# Patient Record
Sex: Female | Born: 1955
Health system: Southern US, Community
[De-identification: ages and names within clinical notes are randomized; demographics above are authoritative.]

## PROBLEM LIST (undated history)

## (undated) DIAGNOSIS — M1990 Unspecified osteoarthritis, unspecified site: Secondary | ICD-10-CM

## (undated) DIAGNOSIS — E785 Hyperlipidemia, unspecified: Secondary | ICD-10-CM

## (undated) DIAGNOSIS — K219 Gastro-esophageal reflux disease without esophagitis: Secondary | ICD-10-CM

## (undated) DIAGNOSIS — G4730 Sleep apnea, unspecified: Secondary | ICD-10-CM

## (undated) DIAGNOSIS — E669 Obesity, unspecified: Secondary | ICD-10-CM

## (undated) DIAGNOSIS — T7840XA Allergy, unspecified, initial encounter: Secondary | ICD-10-CM

## (undated) DIAGNOSIS — F329 Major depressive disorder, single episode, unspecified: Secondary | ICD-10-CM

## (undated) DIAGNOSIS — F32A Depression, unspecified: Secondary | ICD-10-CM

## (undated) DIAGNOSIS — M2550 Pain in unspecified joint: Secondary | ICD-10-CM

## (undated) DIAGNOSIS — R0602 Shortness of breath: Secondary | ICD-10-CM

## (undated) DIAGNOSIS — I10 Essential (primary) hypertension: Secondary | ICD-10-CM

## (undated) DIAGNOSIS — M8580 Other specified disorders of bone density and structure, unspecified site: Secondary | ICD-10-CM

## (undated) DIAGNOSIS — R002 Palpitations: Secondary | ICD-10-CM

## (undated) DIAGNOSIS — R5383 Other fatigue: Secondary | ICD-10-CM

## (undated) HISTORY — DX: Allergy, unspecified, initial encounter: T78.40XA

## (undated) HISTORY — DX: Hyperlipidemia, unspecified: E78.5

## (undated) HISTORY — DX: Gastro-esophageal reflux disease without esophagitis: K21.9

## (undated) HISTORY — DX: Obesity, unspecified: E66.9

## (undated) HISTORY — DX: Other specified disorders of bone density and structure, unspecified site: M85.80

## (undated) HISTORY — DX: Pain in unspecified joint: M25.50

## (undated) HISTORY — DX: Other fatigue: R53.83

## (undated) HISTORY — DX: Depression, unspecified: F32.A

## (undated) HISTORY — DX: Major depressive disorder, single episode, unspecified: F32.9

## (undated) HISTORY — PX: BREAST SURGERY: SHX581

## (undated) HISTORY — DX: Essential (primary) hypertension: I10

## (undated) HISTORY — DX: Sleep apnea, unspecified: G47.30

## (undated) HISTORY — PX: RETINAL DETACHMENT SURGERY: SHX105

## (undated) HISTORY — DX: Unspecified osteoarthritis, unspecified site: M19.90

## (undated) HISTORY — PX: KNEE ARTHROSCOPY: SHX127

## (undated) HISTORY — PX: RHINOPLASTY: SHX2354

## (undated) HISTORY — DX: Shortness of breath: R06.02

## (undated) HISTORY — DX: Palpitations: R00.2

---

## 1988-08-14 HISTORY — PX: LAPAROTOMY: SHX154

## 1998-08-14 HISTORY — PX: COLOSTOMY: SHX63

## 1999-03-28 ENCOUNTER — Encounter: Payer: Self-pay | Admitting: Gastroenterology

## 1999-03-28 ENCOUNTER — Ambulatory Visit (HOSPITAL_COMMUNITY): Admission: RE | Admit: 1999-03-28 | Discharge: 1999-03-28 | Payer: Self-pay | Admitting: Gastroenterology

## 2000-08-10 ENCOUNTER — Encounter (INDEPENDENT_AMBULATORY_CARE_PROVIDER_SITE_OTHER): Payer: Self-pay | Admitting: Specialist

## 2000-08-10 ENCOUNTER — Inpatient Hospital Stay (HOSPITAL_COMMUNITY): Admission: RE | Admit: 2000-08-10 | Discharge: 2000-08-13 | Payer: Self-pay | Admitting: Obstetrics and Gynecology

## 2000-08-10 HISTORY — PX: ABDOMINAL HYSTERECTOMY: SHX81

## 2000-08-17 ENCOUNTER — Ambulatory Visit (HOSPITAL_COMMUNITY): Admission: RE | Admit: 2000-08-17 | Discharge: 2000-08-17 | Payer: Self-pay | Admitting: Obstetrics and Gynecology

## 2000-08-17 ENCOUNTER — Encounter: Payer: Self-pay | Admitting: Obstetrics and Gynecology

## 2001-08-27 ENCOUNTER — Other Ambulatory Visit: Admission: RE | Admit: 2001-08-27 | Discharge: 2001-08-27 | Payer: Self-pay | Admitting: Obstetrics and Gynecology

## 2002-09-23 ENCOUNTER — Other Ambulatory Visit: Admission: RE | Admit: 2002-09-23 | Discharge: 2002-09-23 | Payer: Self-pay | Admitting: Obstetrics and Gynecology

## 2003-10-15 ENCOUNTER — Other Ambulatory Visit: Admission: RE | Admit: 2003-10-15 | Discharge: 2003-10-15 | Payer: Self-pay | Admitting: Obstetrics and Gynecology

## 2003-11-18 ENCOUNTER — Ambulatory Visit (HOSPITAL_COMMUNITY): Admission: RE | Admit: 2003-11-18 | Discharge: 2003-11-18 | Payer: Self-pay | Admitting: *Deleted

## 2004-10-28 ENCOUNTER — Encounter: Admission: RE | Admit: 2004-10-28 | Discharge: 2004-10-28 | Payer: Self-pay | Admitting: Obstetrics and Gynecology

## 2004-11-29 ENCOUNTER — Other Ambulatory Visit: Admission: RE | Admit: 2004-11-29 | Discharge: 2004-11-29 | Payer: Self-pay | Admitting: Obstetrics and Gynecology

## 2006-12-26 ENCOUNTER — Other Ambulatory Visit: Admission: RE | Admit: 2006-12-26 | Discharge: 2006-12-26 | Payer: Self-pay | Admitting: Obstetrics and Gynecology

## 2008-05-13 ENCOUNTER — Ambulatory Visit (HOSPITAL_COMMUNITY): Admission: RE | Admit: 2008-05-13 | Discharge: 2008-05-13 | Payer: Self-pay | Admitting: Emergency Medicine

## 2008-07-02 ENCOUNTER — Ambulatory Visit: Payer: Self-pay | Admitting: Obstetrics and Gynecology

## 2008-07-15 ENCOUNTER — Ambulatory Visit: Payer: Self-pay | Admitting: Obstetrics and Gynecology

## 2008-07-15 ENCOUNTER — Other Ambulatory Visit: Admission: RE | Admit: 2008-07-15 | Discharge: 2008-07-15 | Payer: Self-pay | Admitting: Obstetrics and Gynecology

## 2008-07-15 ENCOUNTER — Encounter: Payer: Self-pay | Admitting: Obstetrics and Gynecology

## 2008-08-14 HISTORY — PX: LAPAROSCOPIC CHOLECYSTECTOMY: SUR755

## 2008-11-05 ENCOUNTER — Ambulatory Visit: Payer: Self-pay | Admitting: Obstetrics and Gynecology

## 2008-12-01 ENCOUNTER — Ambulatory Visit: Payer: Self-pay | Admitting: Obstetrics and Gynecology

## 2009-03-08 ENCOUNTER — Ambulatory Visit: Payer: Self-pay | Admitting: Obstetrics and Gynecology

## 2009-11-04 ENCOUNTER — Other Ambulatory Visit: Admission: RE | Admit: 2009-11-04 | Discharge: 2009-11-04 | Payer: Self-pay | Admitting: Obstetrics and Gynecology

## 2009-11-04 ENCOUNTER — Ambulatory Visit: Payer: Self-pay | Admitting: Obstetrics and Gynecology

## 2010-01-04 ENCOUNTER — Ambulatory Visit: Payer: Self-pay | Admitting: Obstetrics and Gynecology

## 2010-01-11 ENCOUNTER — Ambulatory Visit: Payer: Self-pay | Admitting: Obstetrics and Gynecology

## 2010-12-19 ENCOUNTER — Other Ambulatory Visit: Payer: Federal, State, Local not specified - PPO

## 2010-12-19 ENCOUNTER — Ambulatory Visit (INDEPENDENT_AMBULATORY_CARE_PROVIDER_SITE_OTHER): Payer: Federal, State, Local not specified - PPO | Admitting: Obstetrics and Gynecology

## 2010-12-19 DIAGNOSIS — N83339 Acquired atrophy of ovary and fallopian tube, unspecified side: Secondary | ICD-10-CM

## 2010-12-19 DIAGNOSIS — R5381 Other malaise: Secondary | ICD-10-CM

## 2010-12-19 DIAGNOSIS — N7013 Chronic salpingitis and oophoritis: Secondary | ICD-10-CM

## 2010-12-19 DIAGNOSIS — R1904 Left lower quadrant abdominal swelling, mass and lump: Secondary | ICD-10-CM

## 2010-12-19 DIAGNOSIS — Z833 Family history of diabetes mellitus: Secondary | ICD-10-CM

## 2010-12-30 NOTE — Op Note (Signed)
[facility]  Patient:    Cheryl [name], Cheryl [name]                   MRN: [id number] Proc. Date: 08/10/00 Adm. Date:  [id number] Attending:  Sharon Mt                           Operative Report  PREOPERATIVE DIAGNOSIS:  Fibroids, dysmenorrhea, menorrhagia, pelvic pain -- endometriosis suspected.  POSTOPERATIVE DIAGNOSIS:  Fibroids, dysmenorrhea, menorrhagia, pelvic pain, with endometriosis.  OPERATION:  Exploratory laparotomy with total abdominal hysterectomy, right salpingo-oophorectomy, cystotomy and repair with cystotomy.  SURGEON:  Daniel L. Eda Paschal, M.D.  FIRST ASSISTANT:  Timothy P. Fontaine, M.D.  ANESTHESIA:  General endotracheal.  FINDINGS:  At the time of laparotomy, patients uterus was enlarged by multiple myelomas.  Her right ovary was enlarged by a cyst, which on frozen section was a benign corpus luteum.  She had a significant endometrial implant on the surface of the ovary of approximately 0.5 cm.  There were some mild pelvic adhesions from her previous right ovarian cystectomy.  Pelvic peritoneum was free of disease otherwise.  Left ovary and tube were normal and were left in place.  Patient had significant preperitoneal fat and some adhesive disease of the bladder to the above and the bladder was inadvertently opened during the opening of the procedure and looked normal; it was the top of the bladder that was opened for approximately two inches.  DESCRIPTION OF PROCEDURE:  After adequate general endotracheal anesthesia, the patient was placed in a supine position and prepped and draped in the usual sterile manner.  A Foley catheter was inserted in the patients bladder. Pfannenstiel incision was made.  The fascia was opened transversely.  An attempt was made to enter the peritoneal cavity; this was very difficult due to a significant amount of preperitoneal fat, plus there also appeared to be significant adhesive disease  from her previous laparotomy.  At one point, it was felt that the peritoneum had been identified.  It was elevated.  It transilluminated well and therefore, a small incision was made into it; however, it was felt that this was indeed the bladder, not the peritoneal cavity that had been entered.  The total length of the incision in the bladder was about two inches.  It was a sharp incision.  It was at the top of the bladder at a very fortuitous place.  As a result of this, with the bladder opened, the peritoneum finally could be identified and entered and the peritoneal cavity was opened.  The peritoneum was then dissected free from the bladder so that a tension-free closure of the bladder could be done.  The bladder was closed in two layers; the first layer was 3-0 Vicryl, trying to close the muscularis of the bladder and leaving the mucosa approximated without actually suturing in the mucosa; this was a running-locking layer. Following this, a reinforced layer of 0 Vicryl was placed, imbricating the first layer; an excellent closure was obtained.  It was not tested to be watertight until the procedure was terminated.  At this point, the surgery proceeded.  The above findings were noted.  The round ligaments were clamped, cut and suture-ligated with #1 chromic catgut.  The vesicouterine fold of peritoneum was sharply dissected free.  On the right, the ureter was identified, isolated and then the infundibulopelvic ligament on the right was clamped, cut and suture-ligated with #1 chromic catgut.  It was then doubly ligated.  The ovary and tube on the right were removed and sent for frozen section, which came back corpus luteum in terms of a large cyst and probably endometriosis in terms of the implant on the surface.  On the left side, the ovary looked normal, so the uteroovarian ligament and round ligament were clamped, cut and suture-ligated with #1 chromic catgut; they were doubly ligated.   The bladder flap was further advanced.  There was a myoma right in the midline but we were able to advance the bladder without difficulty.  The uterine arteries were then clamped, cut and doubly suture-ligated with #1 chromic catgut.  The parametrium was taken down in successive bites by clamping, cutting and suture-ligating with #1 chromic catgut.  Cervicovaginal junction was identified, entered with sharp dissection and the uterus was sent to pathology for tissue diagnosis.  Angle sutures were placed in the angles of the vagina, incorporating uterosacral ligaments and cardinal ligaments for good vault support.  A #1 chromic catgut was utilized for the two angle sutures.  Figure-of-eight of #1 chromic were utilized to close the vaginal cuff.  Copious irrigation was done with Ringers lactate.  There was no bleeding noted.  Two sponge, needle and instrument counts were correct.  The peritoneal cavity was closed with a running 0 Vicryl.  At this point, the area where the cystotomy had been done and repair was reidentified.  The bladder was filled with sterile saline.  There was a watertight seal.  The urine was already clear and therefore it was felt that no further repair was necessary. The fascia was closed with two running 0 Vicryl.  A subcutaneous running 2-0 plain suture was placed, bringing together Scarpas layer and then the skin was closed staples.  Estimated blood loss for the entire procedure was 250 cc with none replaced.  Patient tolerated the procedure well and left the operating room in satisfactory condition, draining just slightly ______ . DD:  08/10/00 TD:  08/10/00 Job: [id number]

## 2010-12-30 NOTE — Discharge Summary (Signed)
[facility]  Patient:    Cheryl [name], Cheryl [name]                       MRN: [id number] Adm. Date:  08/10/00 Disc. Date: 08/13/00 Attending:  Rande Brunt. Eda Paschal, M.D.                           Discharge Summary  HISTORY OF PRESENT ILLNESS:  The patient is a 55 year old female who was admitted to the hospital with pelvic pain from fibroids for definitive surgery.  HOSPITAL COURSE:  On the day of admission she was taken to the operating room. A total abdominal hysterectomy and right salpingo-oophorectomy were performed with findings of fibroids and endometriosis.  During the surgery she had an inadvertent cystotomy which was repaired without difficulty.  Postoperatively she had an ileus which responded to continued IV fluids and restricting p.o. By the third postoperative day she finally started to pass gas.  She was discharged on a soft diet to advance as tolerated.  Activity was ambulatory. Discharge pain medicine was Tylox.  Final pathology report is not available at the time of dictation.  The patient was discharged with her Foley in place, to keep the bladder at rest for seven days because of the cystotomy repair.  On Friday of this week she will come in the hospital for a cystogram.  If the healing is fine, she will then go to my office for Foley removal.DD:  08/13/00 TD:  08/13/00 Job: [id number]

## 2010-12-30 NOTE — H&P (Signed)
[facility]  Patient:    Cheryl [name], Cheryl [name]                       MRN: [id number] Attending:  Rande Brunt. Eda Paschal, M.D.                         History and Physical  CHIEF COMPLAINT:  Symptomatic fibroids.  HISTORY OF PRESENT ILLNESS:  The patient is a 55 year old gravida 0, para 0, Ab0, who enters the hospital now for definitive surgery because of symptomatic fibroids.  She is getting more and more pain with her fibroids, they are mostly on the right side, they occur either midcycle when she is ovulating or when she is having her period.  It has been associated with both menorrhagia and also dysmenorrhea.  On ultrasound, she has multiple myomata, the largest of which is 5 cm; she also has at least four other ones.  Patient had a previous ovarian cystectomy and also had endometriosis and it is also certainly possible that she had a recurrence of her endometriosis.  She will undergo total abdominal hysterectomy; if there is significant disease involving her right adnexa, she will also undergo a right salpingo-oophorectomy, as her pain is always right-sided.  We will not remove both ovaries unless she has a malignancy, because of her age.  PAST MEDICAL HISTORY:  Right ovarian cystectomy done in 1990 for a dermoid cyst of the right ovary as well as endometriosis.  Patient has also had previous breast biopsy.  PRESENT MEDICATIONS:  Nonsteroidal anti-inflammatory drugs for fibroids.  ALLERGIES:  Patient is allergic to no drugs.  FAMILY HISTORY:  Father is hypertensive, grandfather had colon cancer and mother has had breast cancer.  SOCIAL HISTORY:  Patient is a nonsmoker but she drinks alcohol moderately.  REVIEW OF SYSTEMS:  HEENT:  Negative.  CARDIAC:  Negative.  RESPIRATORY: Negative.  GI:  History of a colonoscopy because of irritable bowel syndrome. GU:  Negative.  NEUROMUSCULAR:  Negative.  ENDOCRINE:  Negative.  PHYSICAL EXAMINATION  GENERAL:   Patient is a well-developed, well-nourished female in no acute distress.  VITAL SIGNS:  Blood pressure is 120/86, pulse is 80 and regular, respirations 16 and unlabored.  She is afebrile.  HEENT:  All within normal limits.  NECK:  Supple.  Trachea in the midline.  Thyroid is not enlarged.  LUNGS:  Clear to P&A.  HEART:  No thrills, heaves or murmurs.  BREASTS:  No masses.  ABDOMEN:  Soft, without guarding, rebound or masses.  PELVIC:  External and vagina is within normal limits.  Cervix is clear.  Pap smear showed no atypia.  Uterus is enlarged by multiple myelomas to about 10- and 11-week-size.  Adnexa are palpably normal.  RECTAL:  Negative.  ADMISSION IMPRESSION:  Symptomatic fibroids; possible endometriosis.  PLAN:  Exploratory laparotomy with total abdominal hysterectomy and possible right salpingo-oophorectomy. DD:  08/10/00 TD:  08/10/00 Job: [id number]

## 2011-01-05 ENCOUNTER — Encounter: Payer: Self-pay | Admitting: Cardiology

## 2011-01-06 ENCOUNTER — Ambulatory Visit (INDEPENDENT_AMBULATORY_CARE_PROVIDER_SITE_OTHER): Payer: Federal, State, Local not specified - PPO | Admitting: Cardiology

## 2011-01-06 ENCOUNTER — Encounter: Payer: Self-pay | Admitting: Cardiology

## 2011-01-06 VITALS — BP 118/76 | HR 75 | Ht 66.0 in | Wt 242.0 lb

## 2011-01-06 DIAGNOSIS — R079 Chest pain, unspecified: Secondary | ICD-10-CM | POA: Insufficient documentation

## 2011-01-06 DIAGNOSIS — E669 Obesity, unspecified: Secondary | ICD-10-CM | POA: Insufficient documentation

## 2011-01-06 DIAGNOSIS — R0602 Shortness of breath: Secondary | ICD-10-CM

## 2011-01-06 NOTE — Patient Instructions (Signed)
Your physician has requested that you have a lexiscan myoview. For further information please visit [email or web address] Please follow instruction sheet, as given.    Your physician recommends that you schedule a follow-up appointment in: as needed with Dr. Daleen Squibb

## 2011-01-06 NOTE — Progress Notes (Signed)
   Patient ID: Cheryl [name], female    DOB: Jan 09, 1956, 55 y.o.   MRN: [id number]  HPI [name] comes in today referred by Dr Perrin Maltese for chest pain. Has been happening for the last 6 weeks. Described as substernal pressure that can occur with and without exertion. At rest, it occurs with stress. She has a lot of GERD as well and only takes Pepcid AC OTC.  She has several CRF's including remote tobacco, obesity, HTN and age.  EKG today shows NSR with no ST changes. No change from Dr Perrin Maltese office.    Review of Systems  All other systems reviewed and are negative.      Physical Exam  Nursing note and vitals reviewed. Constitutional: She is oriented to person, place, and time. She appears well-developed and well-nourished. No distress.       obese  HENT:  Head: Normocephalic and atraumatic.  Eyes: EOM are normal. Pupils are equal, round, and reactive to light.  Neck: Normal range of motion. Neck supple. No JVD present. No tracheal deviation present. No thyromegaly present.  Cardiovascular: Normal rate, regular rhythm, normal heart sounds and intact distal pulses.   No murmur heard. Pulmonary/Chest: Effort normal and breath sounds normal.  Abdominal: Soft. Bowel sounds are normal.  Musculoskeletal: Normal range of motion. She exhibits no edema.  Neurological: She is alert and oriented to person, place, and time.  Skin: Skin is warm and dry.  Psychiatric: She has a normal mood and affect.

## 2011-01-06 NOTE — Assessment & Plan Note (Signed)
Symptoms concerning for coronary ischemia. Stress Myoview for ruling out obstructive CAD. No change in meds.

## 2011-01-11 ENCOUNTER — Encounter: Payer: Self-pay | Admitting: Cardiology

## 2011-01-12 ENCOUNTER — Encounter (INDEPENDENT_AMBULATORY_CARE_PROVIDER_SITE_OTHER): Payer: Federal, State, Local not specified - PPO | Admitting: Obstetrics and Gynecology

## 2011-01-12 ENCOUNTER — Other Ambulatory Visit (HOSPITAL_COMMUNITY)
Admission: RE | Admit: 2011-01-12 | Discharge: 2011-01-12 | Disposition: A | Discharge status: Home / Self Care | Payer: Federal, State, Local not specified - PPO | Source: Ambulatory Visit | Attending: Obstetrics and Gynecology | Admitting: Obstetrics and Gynecology

## 2011-01-12 ENCOUNTER — Other Ambulatory Visit: Payer: Self-pay | Admitting: Obstetrics and Gynecology

## 2011-01-12 DIAGNOSIS — R82998 Other abnormal findings in urine: Secondary | ICD-10-CM

## 2011-01-12 DIAGNOSIS — Z124 Encounter for screening for malignant neoplasm of cervix: Secondary | ICD-10-CM | POA: Insufficient documentation

## 2011-01-12 DIAGNOSIS — Z01419 Encounter for gynecological examination (general) (routine) without abnormal findings: Secondary | ICD-10-CM

## 2011-01-12 DIAGNOSIS — N951 Menopausal and female climacteric states: Secondary | ICD-10-CM

## 2011-01-23 ENCOUNTER — Ambulatory Visit (HOSPITAL_COMMUNITY): Payer: Federal, State, Local not specified - PPO | Attending: Cardiology | Admitting: Radiology

## 2011-01-23 DIAGNOSIS — R0989 Other specified symptoms and signs involving the circulatory and respiratory systems: Secondary | ICD-10-CM

## 2011-01-23 DIAGNOSIS — R0789 Other chest pain: Secondary | ICD-10-CM

## 2011-01-23 DIAGNOSIS — R0609 Other forms of dyspnea: Secondary | ICD-10-CM

## 2011-01-23 DIAGNOSIS — R079 Chest pain, unspecified: Secondary | ICD-10-CM | POA: Insufficient documentation

## 2011-01-23 DIAGNOSIS — R0602 Shortness of breath: Secondary | ICD-10-CM | POA: Insufficient documentation

## 2011-01-23 MED ORDER — TECHNETIUM TC 99M TETROFOSMIN IV KIT
33.0000 | PACK | Freq: Once | INTRAVENOUS | Status: AC | PRN
  Administered 2011-01-23: 33 via INTRAVENOUS

## 2011-01-23 MED ORDER — REGADENOSON 0.4 MG/5ML IV SOLN
0.4000 mg | Freq: Once | INTRAVENOUS | Status: AC
  Administered 2011-01-23: 0.4 mg via INTRAVENOUS

## 2011-01-23 NOTE — Progress Notes (Signed)
[facility] SITE 3 NUCLEAR MED [address] [place] [address] [phone]  Cardiology Nuclear Med Study  Cheryl [name] is a 55 y.o. female [id number] Nov 15, 1955   Nuclear Med Background Indication for Stress Test:  Evaluation for Ischemia History:  No previous documented CAD and GXT 5-6 years ago: Ok per patient Cardiac Risk Factors: Family History - CAD, History of Smoking and Hypertension  Symptoms:  Chest Pain, Chest Pressure with Exertion (last date of chest discomfort 2 weeks ago), Dizziness, DOE, Fatigue, Fatigue with Exertion, Light-Headedness, Nausea and SOB   Nuclear Pre-Procedure Caffeine/Decaff Intake:  None NPO After: 8:00pm   Lungs:  Clear IV 0.9% NS with Angio Cath:  22g  IV Site: R Antecubital  IV Started by:  Irean Hong, RN  Chest Size (in):  40 Cup Size: C  Height: 5\' 6"  (1.676 m)  Weight:  246 lb (111.585 kg)  BMI:  Body mass index is 39.71 kg/(m^2). Tech Comments:  N/A    Nuclear Med Study 1 or 2 day study: 2 day  Stress Test Type:  Treadmill/Lexiscan  Reading MD: Charlton Haws, MD  Order Authorizing Provider:  Valera Castle, MD  Resting Radionuclide: Technetium 81m Tetrofosmin  Resting Radionuclide Dose: 33 mCi   Stress Radionuclide:  Technetium 51m Tetrofosmin  Stress Radionuclide Dose: 33 mCi           Stress Protocol Rest HR: 59 Stress HR: 107  Rest BP: 115/64 Stress BP: 141/74  Exercise Time (min): 2:00 METS: n/a   Predicted Max HR: 166 bpm % Max HR: 64.46 bpm Rate Pressure Product: 21308   Dose of Adenosine (mg):  n/a Dose of Lexiscan: 0.4 mg  Dose of Atropine (mg): n/a Dose of Dobutamine: n/a mcg/kg/min (at max HR)  Stress Test Technologist: Irean Hong, RN  Nuclear Technologist:  Domenic Polite, CNMT     Rest Procedure:  Myocardial perfusion imaging was performed at rest 45 minutes following the intravenous administration of Technetium 16m Tetrofosmin. Rest ECG: SB-NSR with nonspecific T wave  changes  Stress Procedure:  The patient received IV Lexiscan 0.4 mg over 15-seconds with concurrent low level exercise. Technetium 22m Tetrofosmin was injected at 30-seconds while the patient continued walking.  There were nonspecific T wave  changes with Lexiscan.  Quantitative spect images were obtained after a 45-minute delay. Stress ECG: Nonspecific ECG changes.   QPS Raw Data Images:  Normal; no motion artifact; normal heart/lung ratio. Stress Images:  Normal homogeneous uptake in all areas of the myocardium. Rest Images:  Normal homogeneous uptake in all areas of the myocardium. Subtraction (SDS):  There is no evidence of scar or ischemia. Transient Ischemic Dilatation (Normal <1.22):  1.02 Lung/Heart Ratio (Normal <0.45):  0.31  Quantitative Gated Spect Images QGS EDV:  89 ml QGS ESV:  25 ml QGS cine images:  Normal Wall Motion QGS EF: 72%  Impression Exercise Capacity:  Lexiscan with low level exercise. BP Response:  Normal blood pressure response. Clinical Symptoms:  Mild dyspnea and lightheadedness ECG Impression:  Nonspecific development of T wave inversions in the anterior leads.  No ischemic ST depression.  Comparison with Prior Nuclear Study: No previous nuclear study performed  Overall Impression:  Normal stress nuclear study.  Cheryl [name] [employer]

## 2011-01-25 ENCOUNTER — Ambulatory Visit (HOSPITAL_COMMUNITY): Payer: Federal, State, Local not specified - PPO | Attending: Cardiology | Admitting: Radiology

## 2011-01-25 DIAGNOSIS — R0989 Other specified symptoms and signs involving the circulatory and respiratory systems: Secondary | ICD-10-CM

## 2011-01-25 MED ORDER — TECHNETIUM TC 99M TETROFOSMIN IV KIT
33.0000 | PACK | Freq: Once | INTRAVENOUS | Status: AC | PRN
  Administered 2011-01-25: 33 via INTRAVENOUS

## 2011-01-26 NOTE — Progress Notes (Signed)
copy to Dr. Daleen Squibb.Scarlette Ar

## 2011-02-13 ENCOUNTER — Other Ambulatory Visit: Payer: Federal, State, Local not specified - PPO

## 2011-09-17 ENCOUNTER — Ambulatory Visit (INDEPENDENT_AMBULATORY_CARE_PROVIDER_SITE_OTHER): Payer: Federal, State, Local not specified - PPO | Admitting: Physician Assistant

## 2011-09-17 ENCOUNTER — Ambulatory Visit: Payer: Federal, State, Local not specified - PPO

## 2011-09-17 DIAGNOSIS — R509 Fever, unspecified: Secondary | ICD-10-CM

## 2011-09-17 DIAGNOSIS — R059 Cough, unspecified: Secondary | ICD-10-CM

## 2011-09-17 DIAGNOSIS — R05 Cough: Secondary | ICD-10-CM

## 2011-09-17 DIAGNOSIS — J189 Pneumonia, unspecified organism: Secondary | ICD-10-CM

## 2011-09-17 LAB — POCT CBC
HCT, POC: 39.6 % (ref 37.7–47.9)
Hemoglobin: 12.6 g/dL (ref 12.2–16.2)
Lymph, poc: 1.6 (ref 0.6–3.4)
MCH, POC: 27.6 pg (ref 27–31.2)
MCHC: 31.8 g/dL (ref 31.8–35.4)
MCV: 86.8 fL (ref 80–97)
MPV: 10.6 fL (ref 0–99.8)
POC MID %: 7.6 %M (ref 0–12)
RBC: 4.56 M/uL (ref 4.04–5.48)
WBC: 10.7 10*3/uL — AB (ref 4.6–10.2)

## 2011-09-17 MED ORDER — LEVOFLOXACIN 500 MG PO TABS: 500.0000 mg | ORAL_TABLET | Freq: Every day | ORAL | Status: AC

## 2011-09-17 MED ORDER — PROMETHAZINE-DM 6.25-15 MG/5ML PO SYRP: 5.0000 mL | ORAL_SOLUTION | Freq: Every day | ORAL | Status: AC

## 2011-09-17 NOTE — Progress Notes (Signed)
  Subjective:    Patient ID: Cheryl [name], female    DOB: 12-Sep-1955, 56 y.o.   MRN: [id number]  HPI Cheryl [name] presents c/o head congestion, st 5 days ago.  ST getting better. Cough x2 days with subjective fever. No SOB.  Feels very fatigued. NO CP. Denies influenza vaccination this year.   Review of Systems  Constitutional: Positive for fever, chills and fatigue.  HENT: Positive for congestion and sore throat.   Respiratory: Positive for cough. Negative for shortness of breath and wheezing.   Cardiovascular: Negative for chest pain.  Musculoskeletal: Positive for myalgias.  Neurological: Positive for headaches.       Objective:   Physical Exam  Constitutional: She appears well-developed and well-nourished.  HENT:  Right Ear: Tympanic membrane and ear canal normal.  Left Ear: Tympanic membrane and ear canal normal.  Nose: Mucosal edema present.  Mouth/Throat: Posterior oropharyngeal erythema present. No oropharyngeal exudate.  Lymphadenopathy:    She has no cervical adenopathy.   Results for orders placed in visit on 09/17/11  POCT CBC      Component Value Range   WBC 10.7 (*) 4.6 - 10.2 (K/uL)   Lymph, poc 1.6  0.6 - 3.4    POC LYMPH PERCENT 15.3  10 - 50 (%L)   MID (cbc) 0.8  0 - 0.9    POC MID % 7.6  0 - 12 (%M)   POC Granulocyte 8.2 (*) 2 - 6.9    Granulocyte percent 77.1  37 - 80 (%G)   RBC 4.56  4.04 - 5.48 (M/uL)   Hemoglobin 12.6  12.2 - 16.2 (g/dL)   HCT, POC 40.9  81.1 - 47.9 (%)   MCV 86.8  80 - 97 (fL)   MCH, POC 27.6  27 - 31.2 (pg)   MCHC 31.8  31.8 - 35.4 (g/dL)   RDW, POC 91.4     Platelet Count, POC 264  142 - 424 (K/uL)   MPV 10.6  0 - 99.8 (fL)   [facility] reading (PRIMARY) by  Dr.Doolittle ?early LLL infiltrate       Assessment & Plan:   1. Fever  POCT CBC, DG Chest 2 View, levofloxacin (LEVAQUIN) 500 MG tablet, promethazine-dextromethorphan (PROMETHAZINE-DM) 6.25-15 MG/5ML syrup  2. Cough  POCT CBC, DG Chest 2 View, levofloxacin (LEVAQUIN)  500 MG tablet, promethazine-dextromethorphan (PROMETHAZINE-DM) 6.25-15 MG/5ML syrup  3. PNA (pneumonia)  levofloxacin (LEVAQUIN) 500 MG tablet, promethazine-dextromethorphan (PROMETHAZINE-DM) 6.25-15 MG/5ML syrup   Call in 2 days with status; return sooner if worse RTC precautions reviewed Push fluids!

## 2011-09-17 NOTE — Patient Instructions (Signed)
Mucinex Dm  Take antibiotic once a day Use cough syrup at night Call in 2 days with status, return sooner if worse. Watch for shortness of breath, worsening cough and fever. Tylenol or Advil for aches and fever     Pneumonia, Adult Pneumonia is an infection of the lungs.  CAUSES Pneumonia may be caused by bacteria or a virus. Usually, these infections are caused by breathing infectious particles into the lungs (respiratory tract). SYMPTOMS   Cough.   Fever.   Chest pain.   Increased rate of breathing.   Wheezing.   Mucus production.  DIAGNOSIS  If you have the common symptoms of pneumonia, your caregiver will typically confirm the diagnosis with a chest X-ray. The X-ray will show an abnormality in the lung (pulmonary infiltrate) if you have pneumonia. Other tests of your blood, urine, or sputum may be done to find the specific cause of your pneumonia. Your caregiver may also do tests (blood gases or pulse oximetry) to see how well your lungs are working. TREATMENT  Some forms of pneumonia may be spread to other people when you cough or sneeze. You may be asked to wear a mask before and during your exam. Pneumonia that is caused by bacteria is treated with antibiotic medicine. Pneumonia that is caused by the influenza virus may be treated with an antiviral medicine. Most other viral infections must run their course. These infections will not respond to antibiotics.  PREVENTION A pneumococcal shot (vaccine) is available to prevent a common bacterial cause of pneumonia. This is usually suggested for:  People over 37 years old.   Patients on chemotherapy.   People with chronic lung problems, such as bronchitis or emphysema.   People with immune system problems.  If you are over 65 or have a high risk condition, you may receive the pneumococcal vaccine if you have not received it before. In some countries, a routine influenza vaccine is also recommended. This vaccine can help  prevent some cases of pneumonia.You may be offered the influenza vaccine as part of your care. If you smoke, it is time to quit. You may receive instructions on how to stop smoking. Your caregiver can provide medicines and counseling to help you quit. HOME CARE INSTRUCTIONS   Cough suppressants may be used if you are losing too much rest. However, coughing protects you by clearing your lungs. You should avoid using cough suppressants if you can.   Your caregiver may have prescribed medicine if he or she thinks your pneumonia is caused by a bacteria or influenza. Finish your medicine even if you start to feel better.   Your caregiver may also prescribe an expectorant. This loosens the mucus to be coughed up.   Only take over-the-counter or prescription medicines for pain, discomfort, or fever as directed by your caregiver.   Do not smoke. Smoking is a common cause of bronchitis and can contribute to pneumonia. If you are a smoker and continue to smoke, your cough may last several weeks after your pneumonia has cleared.   A cold steam vaporizer or humidifier in your room or home may help loosen mucus.   Coughing is often worse at night. Sleeping in a semi-upright position in a recliner or using a couple pillows under your head will help with this.   Get rest as you feel it is needed. Your body will usually let you know when you need to rest.  SEEK IMMEDIATE MEDICAL CARE IF:   Your illness becomes worse. This  is especially true if you are elderly or weakened from any other disease.   You cannot control your cough with suppressants and are losing sleep.   You begin coughing up blood.   You develop pain which is getting worse or is uncontrolled with medicines.   You have a fever.   Any of the symptoms which initially brought you in for treatment are getting worse rather than better.   You develop shortness of breath or chest pain.  MAKE SURE YOU:   Understand these instructions.    Will watch your condition.   Will get help right away if you are not doing well or get worse.  Document Released: 07/31/2005 Document Revised: 04/12/2011 Document Reviewed: 10/20/2010 [facility] Patient Information 2012 [place], [place].

## 2011-09-18 ENCOUNTER — Telehealth: Payer: Self-pay | Admitting: Physician Assistant

## 2011-09-18 NOTE — Telephone Encounter (Signed)
Called pt this morning.  States she slept well last night and cough and throat are a little better.  Advised to RTC if sx worsen.

## 2011-10-13 ENCOUNTER — Encounter: Payer: Self-pay | Admitting: Family Medicine

## 2011-12-19 ENCOUNTER — Encounter: Payer: Self-pay | Admitting: Gynecology

## 2011-12-19 DIAGNOSIS — M8580 Other specified disorders of bone density and structure, unspecified site: Secondary | ICD-10-CM | POA: Insufficient documentation

## 2011-12-25 ENCOUNTER — Other Ambulatory Visit: Payer: Self-pay | Admitting: Obstetrics and Gynecology

## 2011-12-25 DIAGNOSIS — N83209 Unspecified ovarian cyst, unspecified side: Secondary | ICD-10-CM

## 2011-12-25 DIAGNOSIS — IMO0002 Reserved for concepts with insufficient information to code with codable children: Secondary | ICD-10-CM

## 2011-12-27 ENCOUNTER — Other Ambulatory Visit (INDEPENDENT_AMBULATORY_CARE_PROVIDER_SITE_OTHER): Payer: Federal, State, Local not specified - PPO

## 2011-12-27 ENCOUNTER — Other Ambulatory Visit: Payer: Federal, State, Local not specified - PPO

## 2011-12-27 ENCOUNTER — Ambulatory Visit (INDEPENDENT_AMBULATORY_CARE_PROVIDER_SITE_OTHER): Payer: Federal, State, Local not specified - PPO | Admitting: Obstetrics and Gynecology

## 2011-12-27 ENCOUNTER — Ambulatory Visit: Payer: Federal, State, Local not specified - PPO | Admitting: Obstetrics and Gynecology

## 2011-12-27 DIAGNOSIS — N9489 Other specified conditions associated with female genital organs and menstrual cycle: Secondary | ICD-10-CM

## 2011-12-27 DIAGNOSIS — IMO0002 Reserved for concepts with insufficient information to code with codable children: Secondary | ICD-10-CM

## 2011-12-27 DIAGNOSIS — R1900 Intra-abdominal and pelvic swelling, mass and lump, unspecified site: Secondary | ICD-10-CM

## 2011-12-27 DIAGNOSIS — N83209 Unspecified ovarian cyst, unspecified side: Secondary | ICD-10-CM

## 2011-12-27 NOTE — Progress Notes (Signed)
Patient came back today for followup ultrasound due to left adnexal mass. On ultrasound her uterus is surgically absent. Her right ovary and fallopian tube are also surgically absent. There is no mass on the right side. The patient's left ovary is normal. Previous mass on left side is now gone. Cul-de-sac is free of fluid. The patient is asymptomatic.  Assessment: Resolution of left adnexal mass.  Plan: Patient reassured. She will return for annual visit.

## 2012-01-05 ENCOUNTER — Encounter: Payer: Self-pay | Admitting: Internal Medicine

## 2012-01-05 ENCOUNTER — Ambulatory Visit (INDEPENDENT_AMBULATORY_CARE_PROVIDER_SITE_OTHER): Payer: Federal, State, Local not specified - PPO | Admitting: Internal Medicine

## 2012-01-05 VITALS — BP 130/84 | HR 90 | Temp 97.9°F | Resp 16 | Ht 66.5 in | Wt 248.8 lb

## 2012-01-05 DIAGNOSIS — J45909 Unspecified asthma, uncomplicated: Secondary | ICD-10-CM

## 2012-01-05 DIAGNOSIS — I10 Essential (primary) hypertension: Secondary | ICD-10-CM

## 2012-01-05 MED ORDER — AZITHROMYCIN 500 MG PO TABS: 500.0000 mg | ORAL_TABLET | Freq: Every day | ORAL | Status: AC

## 2012-01-05 MED ORDER — ALBUTEROL SULFATE HFA 108 (90 BASE) MCG/ACT IN AERS: 2.0000 | INHALATION_SPRAY | Freq: Four times a day (QID) | RESPIRATORY_TRACT | Status: DC | PRN

## 2012-01-05 MED ORDER — HYDROCODONE-ACETAMINOPHEN 7.5-500 MG/15ML PO SOLN: 5.0000 mL | Freq: Four times a day (QID) | ORAL | Status: AC | PRN

## 2012-01-05 MED ORDER — FLUCONAZOLE 150 MG PO TABS: 150.0000 mg | ORAL_TABLET | Freq: Once | ORAL | Status: AC

## 2012-01-05 NOTE — Progress Notes (Signed)
  Subjective:    Patient ID: Cheryl [name], female    DOB: [birth year], 56 y.o.   MRN: [id number]  HPI Cough, congestion head and chest for 3 weeks. No sob, cp Fatigue   Review of Systems     Objective:   Physical Exam Nasal edema, reddness Lungs coarse bs all over       Assessment & Plan:  Asthmatic bronchitis

## 2012-01-05 NOTE — Patient Instructions (Signed)
Chronic Asthmatic Bronchitis  Chronic asthmatic bronchitis is often a complication of frequent asthma and/or bronchitis. After a long enough period of time, the continual airflow blockage is present in spite of treatment for asthma. The medications that used to treat asthma no longer work. The symptoms of chronic bronchitis may also be present. Bronchitis is an inflammation of the breathing tubules in the lungs. The combination of asthma, chronic bronchitis, and emphysema all affect the small breathing tubules (bronchial tree) in our lungs. It is a common condition. The problems from each are similar and overlap with each other so are sometimes hard to diagnose.  When the asthma and bronchitis are combined, there is usually inflammation and infection. The small bronchial tubes produce more mucus. This blocks the airways and makes breathing harder. Usually this process is caused more by external irritants than infection. Smokers with chronic bronchitis are at a greater risk to develop asthmatic bronchitis.  CAUSES    Why some people with asthma go on to develop chronic asthmatic bronchitis is not known. Smoking and environmental toxins or allergens seem to play a role. There are wide differences in who is susceptible.   Abnormalities of the small airways may develop in persons with persistent asthma. Asthmatics can be uncommonly subject to the effects of smoking. Asthma is also found associated with a number of other diseases.  SYMPTOMS   Asthma, chronic bronchitis, and emphysema all cause symptoms of cough, wheezing, shortness of breath, and recurring infections. There may also be chest discomfort. All of the above symptoms happen more often in chronic asthmatic bronchitis.  DIAGNOSIS    Asthma, chronic bronchitis, and emphysema all affect the entire bronchial tree. This makes it difficult on exam to tell them apart. Other tests of the lungs are done to prove a diagnosis. These are called pulmonary function  tests.  TREATMENT    The asthmatic condition itself must always be treated.   Infection can be treated with antibiotics (medications to kill germs).   Serious infections may require hospitalization. These can include pneumonia, sinus infections, and acute bronchitis.  HOME CARE INSTRUCTIONS   Use prescription medications as ordered by your caregiver.   Avoid pollen, dust, animal dander, molds, smoke, and other things that cause attacks at home and at work.   You may have fewer attacks if you decrease dust in your home. Electrostatic air cleaners may help.   It may help to replace your pillows or mattress with materials less likely to cause allergies.   If you are not on fluid restriction, drink 8 to 10 glasses of water each day.   Discuss possible exercise routines with your caregiver.   If animal dander is the cause of asthma, you may need to get rid of pets.   It is important that you:   Become educated about your medical condition.   Participate in maintaining wellness.   Seek medical care promptly or immediately as indicated below.   Delay in seeking medical attention could cause permanent injury and may be a risk to your life.  SEEK MEDICAL CARE IF   You have wheezing and shortness of breath even if taking medicine to prevent attacks.   An oral temperature above 102 F (38.9 C)   You have muscle aches, chest pain, or thickening of sputum.   Your sputum changes from clear or white to yellow, green, gray, or bloody.   You have any problems that may be related to the medicine you are taking (such as   a rash, itching, swelling, or trouble breathing).  SEEK IMMEDIATE MEDICAL CARE IF:   Your usual medicines do not stop your wheezing.   There is increased coughing and/or shortness of breath.   You have increased difficulty breathing.  MAKE SURE YOU:    Understand these instructions.   Will watch your condition.   Will get help right away if you are not doing well or get worse.  Document  Released: 05/18/2006 Document Revised: 07/20/2011 Document Reviewed: 07/16/2007  [employer] Patient Information 2012 [employer].

## 2012-01-10 ENCOUNTER — Ambulatory Visit: Payer: Federal, State, Local not specified - PPO

## 2012-01-10 ENCOUNTER — Other Ambulatory Visit: Payer: Self-pay | Admitting: Obstetrics and Gynecology

## 2012-01-10 DIAGNOSIS — M949 Disorder of cartilage, unspecified: Secondary | ICD-10-CM

## 2012-01-10 DIAGNOSIS — Z13820 Encounter for screening for osteoporosis: Secondary | ICD-10-CM

## 2012-01-10 DIAGNOSIS — M899 Disorder of bone, unspecified: Secondary | ICD-10-CM

## 2012-01-10 DIAGNOSIS — M8580 Other specified disorders of bone density and structure, unspecified site: Secondary | ICD-10-CM

## 2012-01-17 ENCOUNTER — Ambulatory Visit (INDEPENDENT_AMBULATORY_CARE_PROVIDER_SITE_OTHER): Payer: Federal, State, Local not specified - PPO | Admitting: Obstetrics and Gynecology

## 2012-01-17 ENCOUNTER — Encounter: Payer: Self-pay | Admitting: Obstetrics and Gynecology

## 2012-01-17 VITALS — BP 134/84 | Ht 66.0 in | Wt 248.0 lb

## 2012-01-17 DIAGNOSIS — G4730 Sleep apnea, unspecified: Secondary | ICD-10-CM | POA: Insufficient documentation

## 2012-01-17 DIAGNOSIS — Z01419 Encounter for gynecological examination (general) (routine) without abnormal findings: Secondary | ICD-10-CM

## 2012-01-17 DIAGNOSIS — R32 Unspecified urinary incontinence: Secondary | ICD-10-CM | POA: Insufficient documentation

## 2012-01-17 MED ORDER — VENLAFAXINE HCL 75 MG PO TABS: 75.0000 mg | ORAL_TABLET | Freq: Every day | ORAL | Status: DC

## 2012-01-17 MED ORDER — ESTRADIOL 1 MG PO TABS: 1.0000 mg | ORAL_TABLET | Freq: Every day | ORAL | Status: DC

## 2012-01-17 NOTE — Progress Notes (Signed)
Patient came to see me today for her annual GYN exam. She remains on oral estrogen with excellent results for vasomotor symptoms. She is having no vaginal bleeding. She is having no pelvic pain. She is having loss her urine with coughing laughing and sneezing. This is been a bigger  problem this year because of her recurrent upper respiratory infections. Last year she took [place] for me but stopped it and feels she is fine without it. She is due for her yearly mammogram. She recently had an ultrasound here which was normal. She recently had a bone density which showed stable low bone mass. She has had no fractures. She's never had an abnormal Pap smear. She is doing well with her Effexor.  HEENT: Within normal limits. Kennon Portela present. Neck: No masses. Supraclavicular lymph nodes: Not enlarged. Breasts: Examined in both sitting and lying position. Symmetrical without skin changes or masses. Abdomen: Soft no masses guarding or rebound. No hernias. Pelvic: External within normal limits. BUS within normal limits. Vaginal examination shows good estrogen effect, no cystocele enterocele or rectocele. Cervix and uterus absent. Adnexa within normal limits. Rectovaginal confirmatory. Extremities within normal limits.  Assessment: #1. Vasomotor symptoms #2. Urinary stress incontinence #3. Low bone mass  Plan: Continue oral estradiol. Continue Effexor. Mammogram. Patient will call for urological referral if she wants to address the stress incontinence surgically.

## 2012-01-18 ENCOUNTER — Encounter: Payer: Self-pay | Admitting: Obstetrics and Gynecology

## 2012-01-18 LAB — URINALYSIS W MICROSCOPIC + REFLEX CULTURE
Bilirubin Urine: NEGATIVE
Crystals: NONE SEEN
Leukocytes, UA: NEGATIVE
Squamous Epithelial / LPF: NONE SEEN
pH: 6 (ref 5.0–8.0)

## 2012-01-24 ENCOUNTER — Other Ambulatory Visit: Payer: Self-pay | Admitting: Dermatology

## 2012-03-31 ENCOUNTER — Other Ambulatory Visit: Payer: Self-pay | Admitting: Obstetrics and Gynecology

## 2012-05-29 ENCOUNTER — Ambulatory Visit: Payer: Federal, State, Local not specified - PPO

## 2012-05-29 ENCOUNTER — Ambulatory Visit (INDEPENDENT_AMBULATORY_CARE_PROVIDER_SITE_OTHER): Payer: Federal, State, Local not specified - PPO | Admitting: Emergency Medicine

## 2012-05-29 VITALS — BP 141/81 | HR 88 | Temp 98.3°F | Resp 16 | Ht 66.5 in | Wt 258.0 lb

## 2012-05-29 DIAGNOSIS — M25569 Pain in unspecified knee: Secondary | ICD-10-CM

## 2012-05-29 DIAGNOSIS — I10 Essential (primary) hypertension: Secondary | ICD-10-CM

## 2012-05-29 DIAGNOSIS — R300 Dysuria: Secondary | ICD-10-CM

## 2012-05-29 DIAGNOSIS — Z23 Encounter for immunization: Secondary | ICD-10-CM

## 2012-05-29 DIAGNOSIS — M549 Dorsalgia, unspecified: Secondary | ICD-10-CM

## 2012-05-29 DIAGNOSIS — R32 Unspecified urinary incontinence: Secondary | ICD-10-CM

## 2012-05-29 LAB — POCT URINALYSIS DIPSTICK
Bilirubin, UA: NEGATIVE
Blood, UA: NEGATIVE
Glucose, UA: NEGATIVE
Ketones, UA: NEGATIVE
Leukocytes, UA: NEGATIVE
Nitrite, UA: NEGATIVE
Protein, UA: NEGATIVE
Spec Grav, UA: >=1.03
Urobilinogen, UA: 0.2
pH, UA: 5.5

## 2012-05-29 LAB — POCT UA - MICROSCOPIC ONLY
Casts, Ur, LPF, POC: NEGATIVE
Crystals, Ur, HPF, POC: NEGATIVE
Mucus, UA: POSITIVE
Yeast, UA: NEGATIVE

## 2012-05-29 MED ORDER — AMLODIPINE BESYLATE 10 MG PO TABS: 10.0000 mg | ORAL_TABLET | Freq: Every day | ORAL | Status: DC

## 2012-05-29 MED ORDER — TRAMADOL HCL 50 MG PO TABS: 50.0000 mg | ORAL_TABLET | Freq: Four times a day (QID) | ORAL | Status: DC | PRN

## 2012-05-29 NOTE — Progress Notes (Addendum)
Subjective:    Patient ID: Cheryl [name], female    DOB: [birth year], 56 y.o.   MRN: [id number]  HPI This 56 y.o. female presents for evaluation of several issues. 1. Low back pain on the left.  Constant, just below the rib cage.  Thought she had pulled a muscle.  Resolves for a day or so and then recurs.  Began 2 weeks ago.  Naproxen without benefit. No alleviating or aggravating factors.   2. Right knee. Stays swollen medially, and distally.  Burning behind knee.  Feels like she needs to move it to "work it out," feels like her left knee did before she had arthroscopic procedure on that knee.  Symptoms x 3 months.  Naproxen helps it.  Tramadol helped, too, but she ran out, so has been using the naproxen in it's place.  Worse with activity-walking and standing. 3. Some urinary incontinence with urgency, but no change recently.  No burning with urination, no increased frequency.  No hematuria.   Review of Systems As above.   Past Medical History  Diagnosis Date  . Joint pain   . SOB (shortness of breath)   . Fatigue   . Chest pain   . Obesity   . Palpitation   . Endometriosis   . Uterine fibroid   . History of dysmenorrhea   . H/O: menorrhagia   . Depression   . GERD (gastroesophageal reflux disease)   . Hypertension   . Allergy   . Arthritis   . Osteopenia   . Sleep apnea   . Urinary incontinence     Past Surgical History  Procedure Date  . Laparoscopic ovarian cystectomy 1990  . Rhinoplasty   . Knee arthroscopy     left knee  . Abdominal hysterectomy     TAH  . Salpingoophorectomy 08/10/2000    RSO,Cystotomy  . Breast surgery     Biopsy-Benign  . Cholecystectomy   . Abdominal surgery     Fibroid removed    Prior to Admission medications   Medication Sig Start Date End Date Taking? Authorizing Provider  albuterol (PROVENTIL HFA;VENTOLIN HFA) 108 (90 BASE) MCG/ACT inhaler Inhale 2 puffs into the lungs every 6 (six) hours as needed for wheezing. 01/05/12 01/04/13  Yes Jonita Albee, MD  amLODipine (NORVASC) 10 MG tablet Take 10 mg by mouth daily.    Yes Historical Provider, MD  aspirin 81 MG tablet Take 81 mg by mouth daily.     Yes Historical Provider, MD  Cholecalciferol (VITAMIN D3) 1000 UNITS CAPS Take by mouth.     Yes Historical Provider, MD  estradiol (ESTRACE) 1 MG tablet TAKE 1 TABLET BY MOUTH EVERY DAY 03/31/12  Yes Trellis Paganini, MD  Loratadine (CLARITIN PO) Take by mouth.     Yes Historical Provider, MD  Omega-3 Fatty Acids (FISH OIL PO) Take by mouth.     Yes Historical Provider, MD  TraMADol HCl 50 MG TBSO Take by mouth.     Yes Historical Provider, MD  UNABLE TO FIND C PAP for sleep apnea    Yes Historical Provider, MD  venlafaxine (EFFEXOR) 75 MG tablet TAKE 1 TABLET BY MOUTH EVERY DAY 03/31/12  Yes Trellis Paganini, MD  B Complex-Biotin-FA (B-COMPLEX PO) Take by mouth.      Historical Provider, MD    Allergies  Allergen Reactions  . Ace Inhibitors Cough  . Lisinopril Cough  . Z-Pak (Azithromycin)     thrush  . Neosporin (Neomycin-Bacitracin Zn-Polymyx) Rash  History   Social History  . Marital Status: Married    Spouse Name: Wallace Dietsch    Number of Children: 2  . Years of Education: 16   Occupational History  . Substitute Teacher    Social History Main Topics  . Smoking status: Former Smoker    Quit date: 08/15/2007  . Smokeless tobacco: Never Used  . Alcohol Use: 1.0 oz/week    2 drink(s) per week     MODERATELY  . Drug Use: No  . Sexually Active: No   Other Topics Concern  . Not on file   Social History Narrative   2 adopted children, Eric (17) and Beth (18)    Family History  Problem Relation Age of Onset  . Breast cancer Mother     Age 49  . Hypertension Mother   . Hypertension Father   . Arthritis Father   . Heart disease Father   . Cancer Father     Spleen  . Heart disease Maternal Grandfather   . Colon cancer Paternal Grandfather     Colon cancer       Objective:    Physical Exam  Blood pressure 141/81, pulse 88, temperature 98.3 F (36.8 C), temperature source Oral, resp. rate 16, height 5' 6.5" (1.689 m), weight 258 lb (117.028 kg). Body mass index is 41.02 kg/(m^2). Well-developed, well nourished WF who is awake, alert and oriented, in NAD. HEENT: [place]/AT, PERRL, EOMI.  Sclera and conjunctiva are clear.  EAC are patent, TMs are normal in appearance. Nasal mucosa is pink and moist. OP is clear. Neck: supple, non-tender, no lymphadenopathy, thyromegaly. Heart: RRR, no murmur Lungs: normal effort, CTA Abdomen: normo-active bowel sounds, supple, non-tender, no mass or organomegaly. Extremities: no cyanosis, clubbing or edema.  Spider and varicose veins noted on both lower extremities.  Tenderness of the anteromedial aspect of the knee and the area of the anserine bursa, where there are tender dilated veins.  There is mild crepitus in the anterolateral aspect of the knee and tenderness in the popliteal fossa without mass or cord. Back: no tenderness along the spine.  Tenderness with palpation along the lower ribs on the left.  No CVA tenderness.  FROM.  Skin: warm and dry without rash. Psychologic: good mood and appropriate affect, normal speech and behavior.   RIGHT Knee: [facility] reading (PRIMARY) by  Dr. Dareen Piano.  Degenerative changes.  No acute findings.      Assessment & Plan:   1. Back pain  Likely musculoskeletal, expect improvement with tramadol; rest, heat, stretching.  2. Dysuria  POCT UA - Microscopic Only, POCT urinalysis dipstick  3. Knee pain  DG Knee Complete 4 Views Right, traMADol (ULTRAM) 50 MG tablet  4. Need for influenza vaccination  Flu vaccine greater than or equal to 3yo preservative free IM  5. Urinary incontinence  Urine culture; if negative, trial of ditropan  6. HTN (hypertension)  amLODipine (NORVASC) 10 MG tablet; stable.    I have reviewed this patient's chart and agree with the contents and plan Carmelina Dane,  M.D>

## 2012-06-02 LAB — URINE CULTURE: Colony Count: 25000

## 2012-06-04 MED ORDER — DOXYCYCLINE HYCLATE 100 MG PO CAPS: 100.0000 mg | ORAL_CAPSULE | Freq: Two times a day (BID) | ORAL | Status: DC

## 2012-06-05 NOTE — Addendum Note (Signed)
Addended by: Carmelina Dane on: 06/05/2012 04:07 PM   Modules accepted: Level of Service

## 2012-06-28 ENCOUNTER — Ambulatory Visit: Payer: Federal, State, Local not specified - PPO

## 2012-06-28 ENCOUNTER — Ambulatory Visit (INDEPENDENT_AMBULATORY_CARE_PROVIDER_SITE_OTHER): Payer: Federal, State, Local not specified - PPO | Admitting: Emergency Medicine

## 2012-06-28 VITALS — BP 120/76 | HR 101 | Temp 98.1°F | Resp 18 | Ht 66.0 in | Wt 257.8 lb

## 2012-06-28 DIAGNOSIS — M545 Low back pain, unspecified: Secondary | ICD-10-CM

## 2012-06-28 DIAGNOSIS — M25559 Pain in unspecified hip: Secondary | ICD-10-CM

## 2012-06-28 DIAGNOSIS — M25551 Pain in right hip: Secondary | ICD-10-CM

## 2012-06-28 MED ORDER — HYDROCODONE-ACETAMINOPHEN 5-325 MG PO TABS: 1.0000 | ORAL_TABLET | Freq: Four times a day (QID) | ORAL | Status: DC | PRN

## 2012-06-28 NOTE — Progress Notes (Signed)
  Subjective:    Patient ID: Cheryl [name], female    DOB: 09/15/1955, 56 y.o.   MRN: [id number]  HPI pain and discomfort in her buttocks and also in the back of her right leg. She was seen here recently and had x-rays done of her knee which showed a significant arthritis in the knee. She initially wanted to followup with Dr. Priscille Kluver but I advised her he is retired. He is an aching sensation in the buttock and seems to go down the back of her right leg. She is taking tramadol for this in the past but has recently not been able to get any relief from her pain with this medication    Review of Systems     Objective:   Physical Exam there is tenderness present over the right buttocks. Straight leg raising was negative in the right leg. There is good range of motion of the right hip. There is crepitation noted around the right knee but she is able to flex and extend without too much difficulty.  [facility] reading (PRIMARY) by  Dr.Daub there is narrowing of L5-S1 there is calcification of the greater trochanter of the femur.       Assessment & Plan:  She shows signs of significant arthritis. Probably just need to get her pain pain relief until we get her in to see the orthopedist. We'll get films of her back and her hip. She is going to take one to 2 Aleve twice a day. She was given hydrocodone for pain. Referral is made to the orthopedist.

## 2012-07-05 ENCOUNTER — Telehealth: Payer: Self-pay

## 2012-07-19 NOTE — Telephone Encounter (Signed)
None

## 2012-08-22 ENCOUNTER — Other Ambulatory Visit: Payer: Self-pay | Admitting: Physician Assistant

## 2012-10-10 ENCOUNTER — Other Ambulatory Visit: Payer: Self-pay | Admitting: Physician Assistant

## 2012-10-10 NOTE — Telephone Encounter (Signed)
Please call patient - has she seen the Orthopedist yet? She was supposed to see them and this is really what we would recommend for further evaluation of her pain.

## 2012-10-15 ENCOUNTER — Telehealth: Payer: Self-pay | Admitting: *Deleted

## 2012-10-15 NOTE — Telephone Encounter (Signed)
This message was in rx pool in regards to tramadol.   Nelva Nay, PA-C at 10/10/2012 12:55 PM   Status: Signed            Please call patient - has she seen the Orthopedist yet? She was supposed to see them and this is really what we would recommend for further evaluation of her pain.

## 2012-10-16 ENCOUNTER — Telehealth: Payer: Self-pay | Admitting: Radiology

## 2012-10-16 NOTE — Telephone Encounter (Signed)
Called her to advise. Left message for her to call me back.  

## 2012-10-16 NOTE — Telephone Encounter (Signed)
Patient needs to come in and discuss the pain medications she is on.

## 2012-10-16 NOTE — Telephone Encounter (Signed)
Please advise on Tramadol renewal for patient requests from [facility] [address]

## 2012-10-17 NOTE — Telephone Encounter (Signed)
Called her to advise. There is another message regarding this.

## 2012-10-17 NOTE — Telephone Encounter (Signed)
Patient transferred to appt scheduling. 

## 2012-11-01 ENCOUNTER — Encounter: Payer: Self-pay | Admitting: Emergency Medicine

## 2012-11-01 ENCOUNTER — Ambulatory Visit (INDEPENDENT_AMBULATORY_CARE_PROVIDER_SITE_OTHER): Payer: Federal, State, Local not specified - PPO | Admitting: Emergency Medicine

## 2012-11-01 VITALS — BP 140/82 | HR 82 | Temp 98.0°F | Resp 16 | Ht 66.0 in | Wt 247.6 lb

## 2012-11-01 DIAGNOSIS — M25559 Pain in unspecified hip: Secondary | ICD-10-CM

## 2012-11-01 DIAGNOSIS — M545 Low back pain, unspecified: Secondary | ICD-10-CM

## 2012-11-01 DIAGNOSIS — M25551 Pain in right hip: Secondary | ICD-10-CM

## 2012-11-01 MED ORDER — TRAMADOL HCL 50 MG PO TABS: ORAL_TABLET | ORAL | Status: DC

## 2012-11-01 MED ORDER — HYDROCODONE-ACETAMINOPHEN 5-325 MG PO TABS: ORAL_TABLET | ORAL | Status: DC

## 2012-11-01 NOTE — Progress Notes (Signed)
  Subjective:    Patient ID: Cheryl [name], female    DOB: 03/11/1956, 57 y.o.   MRN: [id number]  HPI patient continues to have significant discomfort in her back and into her right knee. She saw Dr. Delorse Lek who felt she had a radiculopathy. She is currently on Celebrex she takes once a day. She takes one Ultram in the morning and one half to one hydrocodone at night.    Review of Systems     Objective:   Physical Exam there is tenderness at the SI joint on the right. There is decreased range of motion of the back. Deep tendon reflexes at the knees are 2+ and symmetrical at the ankle 2+ and symmetrical. Motor strength is 5 out of 5 all muscle groups. There degenerative changes around the knee        Assessment & Plan:  I still feel most of her symptoms are due to her radiculopathy. She is going to stay on the Celebrex daily. It is okay for her to take one Ultram during the day and one hydrocodone at night. She will be scheduled for an MRI of the back without contrast. She is agreeable to make an appointment to followup with Dr. [name] Hillock  regarding the next step in her treatment.

## 2012-11-12 ENCOUNTER — Ambulatory Visit
Admission: RE | Admit: 2012-11-12 | Discharge: 2012-11-12 | Disposition: A | Discharge status: Home / Self Care | Payer: Federal, State, Local not specified - PPO | Source: Ambulatory Visit | Attending: Emergency Medicine | Admitting: Emergency Medicine

## 2012-11-12 DIAGNOSIS — M545 Low back pain, unspecified: Secondary | ICD-10-CM

## 2012-11-12 DIAGNOSIS — M25551 Pain in right hip: Secondary | ICD-10-CM

## 2012-11-27 ENCOUNTER — Other Ambulatory Visit: Payer: Self-pay | Admitting: Physician Assistant

## 2013-01-30 ENCOUNTER — Other Ambulatory Visit: Payer: Self-pay | Admitting: Physician Assistant

## 2013-01-31 ENCOUNTER — Other Ambulatory Visit: Payer: Self-pay | Admitting: Dermatology

## 2013-03-26 ENCOUNTER — Other Ambulatory Visit: Payer: Self-pay | Admitting: Obstetrics and Gynecology

## 2013-03-30 ENCOUNTER — Other Ambulatory Visit: Payer: Self-pay | Admitting: Physician Assistant

## 2013-04-12 ENCOUNTER — Other Ambulatory Visit: Payer: Self-pay | Admitting: Physician Assistant

## 2013-04-30 ENCOUNTER — Ambulatory Visit (INDEPENDENT_AMBULATORY_CARE_PROVIDER_SITE_OTHER): Payer: Federal, State, Local not specified - PPO | Admitting: Emergency Medicine

## 2013-04-30 VITALS — BP 128/86 | HR 96 | Temp 97.8°F | Resp 18 | Ht 66.0 in | Wt 250.0 lb

## 2013-04-30 DIAGNOSIS — I10 Essential (primary) hypertension: Secondary | ICD-10-CM

## 2013-04-30 DIAGNOSIS — Z23 Encounter for immunization: Secondary | ICD-10-CM

## 2013-04-30 MED ORDER — VENLAFAXINE HCL 75 MG PO TABS: 75.0000 mg | ORAL_TABLET | Freq: Every day | ORAL | Status: DC

## 2013-04-30 MED ORDER — AMLODIPINE BESYLATE 10 MG PO TABS: ORAL_TABLET | ORAL | Status: DC

## 2013-04-30 MED ORDER — ESTRADIOL 1 MG PO TABS: 1.0000 mg | ORAL_TABLET | Freq: Every day | ORAL | Status: DC

## 2013-04-30 NOTE — Patient Instructions (Addendum)

## 2013-04-30 NOTE — Progress Notes (Signed)
[facility] [address], [place] [address] [phone]  Date:  04/30/2013   Name:  Cheryl [name]   DOB:  1956-03-05   MRN:  [id number]  PCP:  Tally Due, MD    Chief Complaint: Medication Refill and Flu Vaccine   History of Present Illness:  Cheryl [name] is a 57 y.o. very pleasant female patient who presents with the following:  Needs a refill on her antidepressant and antihypertensive medication.  Not taking hydrocodone or tramadol.  No improvement with over the counter medications or other home remedies. Denies other complaint or health concern today.   Patient Active Problem List   Diagnosis Date Noted  . Sleep apnea   . Urinary incontinence   . HTN (hypertension) 01/05/2012  . Osteopenia   . Chest pain, central 01/06/2011  . SOB (shortness of breath)   . Obesity     Past Medical History  Diagnosis Date  . Joint pain   . SOB (shortness of breath)   . Fatigue   . Chest pain   . Obesity   . Palpitation   . Endometriosis   . Uterine fibroid   . History of dysmenorrhea   . H/O: menorrhagia   . Depression   . GERD (gastroesophageal reflux disease)   . Hypertension   . Allergy   . Arthritis   . Osteopenia   . Sleep apnea   . Urinary incontinence     Past Surgical History  Procedure Laterality Date  . Laparoscopic ovarian cystectomy  1990  . Rhinoplasty    . Knee arthroscopy      left knee  . Abdominal hysterectomy      TAH  . Salpingoophorectomy  08/10/2000    RSO,Cystotomy  . Breast surgery      Biopsy-Benign  . Cholecystectomy    . Abdominal surgery      Fibroid removed    History  Substance Use Topics  . Smoking status: Former Smoker    Quit date: 08/15/2007  . Smokeless tobacco: Never Used  . Alcohol Use: 1.0 oz/week    2 drink(s) per week     Comment: MODERATELY    Family History  Problem Relation Age of Onset  . Breast cancer Mother     Age 5  . Hypertension Mother   . Hypertension Father    . Arthritis Father   . Heart disease Father   . Cancer Father     Spleen  . Heart disease Maternal Grandfather   . Colon cancer Paternal Grandfather     Colon cancer    Allergies  Allergen Reactions  . Ace Inhibitors Cough  . Lisinopril Cough  . Z-Pak [Azithromycin]     thrush  . Neosporin [Neomycin-Bacitracin Zn-Polymyx] Rash    Medication list has been reviewed and updated.  Current Outpatient Prescriptions on File Prior to Visit  Medication Sig Dispense Refill  . amLODipine (NORVASC) 10 MG tablet TAKE 1 TABLET (10 MG TOTAL) BY MOUTH DAILY.  30 tablet  1  . aspirin 81 MG tablet Take 81 mg by mouth daily.        . celecoxib (CELEBREX) 200 MG capsule Take 200 mg by mouth daily.      . Cholecalciferol (VITAMIN D3) 1000 UNITS CAPS Take by mouth.        . estradiol (ESTRACE) 1 MG tablet TAKE 1 TABLET BY MOUTH EVERY DAY  90 tablet  4  . HYDROcodone-acetaminophen (NORCO) 5-325 MG  per tablet Patient can take 1 tablet at night for severe pain  30 tablet  2  . Loratadine (CLARITIN PO) Take by mouth.        . Omega-3 Fatty Acids (FISH OIL PO) Take by mouth.        . traMADol (ULTRAM) 50 MG tablet Patient may take one tablet daily for back pain  30 tablet  2  . UNABLE TO FIND C PAP for sleep apnea       . venlafaxine (EFFEXOR) 75 MG tablet TAKE 1 TABLET BY MOUTH EVERY DAY  90 tablet  4  . albuterol (PROVENTIL HFA;VENTOLIN HFA) 108 (90 BASE) MCG/ACT inhaler Inhale 2 puffs into the lungs every 6 (six) hours as needed for wheezing.  1 Inhaler  0  . B Complex-Biotin-FA (B-COMPLEX PO) Take by mouth.        . doxycycline (VIBRAMYCIN) 100 MG capsule Take 1 capsule (100 mg total) by mouth 2 (two) times daily.  20 capsule  0   No current facility-administered medications on file prior to visit.    Review of Systems:  As per HPI, otherwise negative.    Physical Examination: Filed Vitals:   04/30/13 1513  BP: 128/86  Pulse: 96  Temp: 97.8 F (36.6 C)  Resp: 18   Filed Vitals:    04/30/13 1513  Height: 5\' 6"  (1.676 m)  Weight: 250 lb (113.399 kg)   Body mass index is 40.37 kg/(m^2). Ideal Body Weight: Weight in (lb) to have BMI = 25: 154.6  GEN: WDWN, NAD, Non-toxic, A & O x 3 HEENT: Atraumatic, Normocephalic. Neck supple. No masses, No LAD. Ears and Nose: No external deformity. CV: RRR, No M/G/R. No JVD. No thrill. No extra heart sounds. PULM: CTA B, no wheezes, crackles, rhonchi. No retractions. No resp. distress. No accessory muscle use. ABD: S, NT, ND, +BS. No rebound. No HSM. EXTR: No c/c/e NEURO Normal gait.  PSYCH: Normally interactive. Conversant. Not depressed or anxious appearing.  Calm demeanor.    Assessment and Plan: Hypertension Depression Flu immunization   Signed,  Phillips Odor, MD

## 2013-11-06 IMAGING — CR DG LUMBAR SPINE 2-3V
2 series · 2 of 2 positions shown · non-contrast
Comparison: Preliminary reading Dr.[place]

CLINICAL DATA: Back pain

LUMBAR SPINE - 2-3 VIEW

[AP]
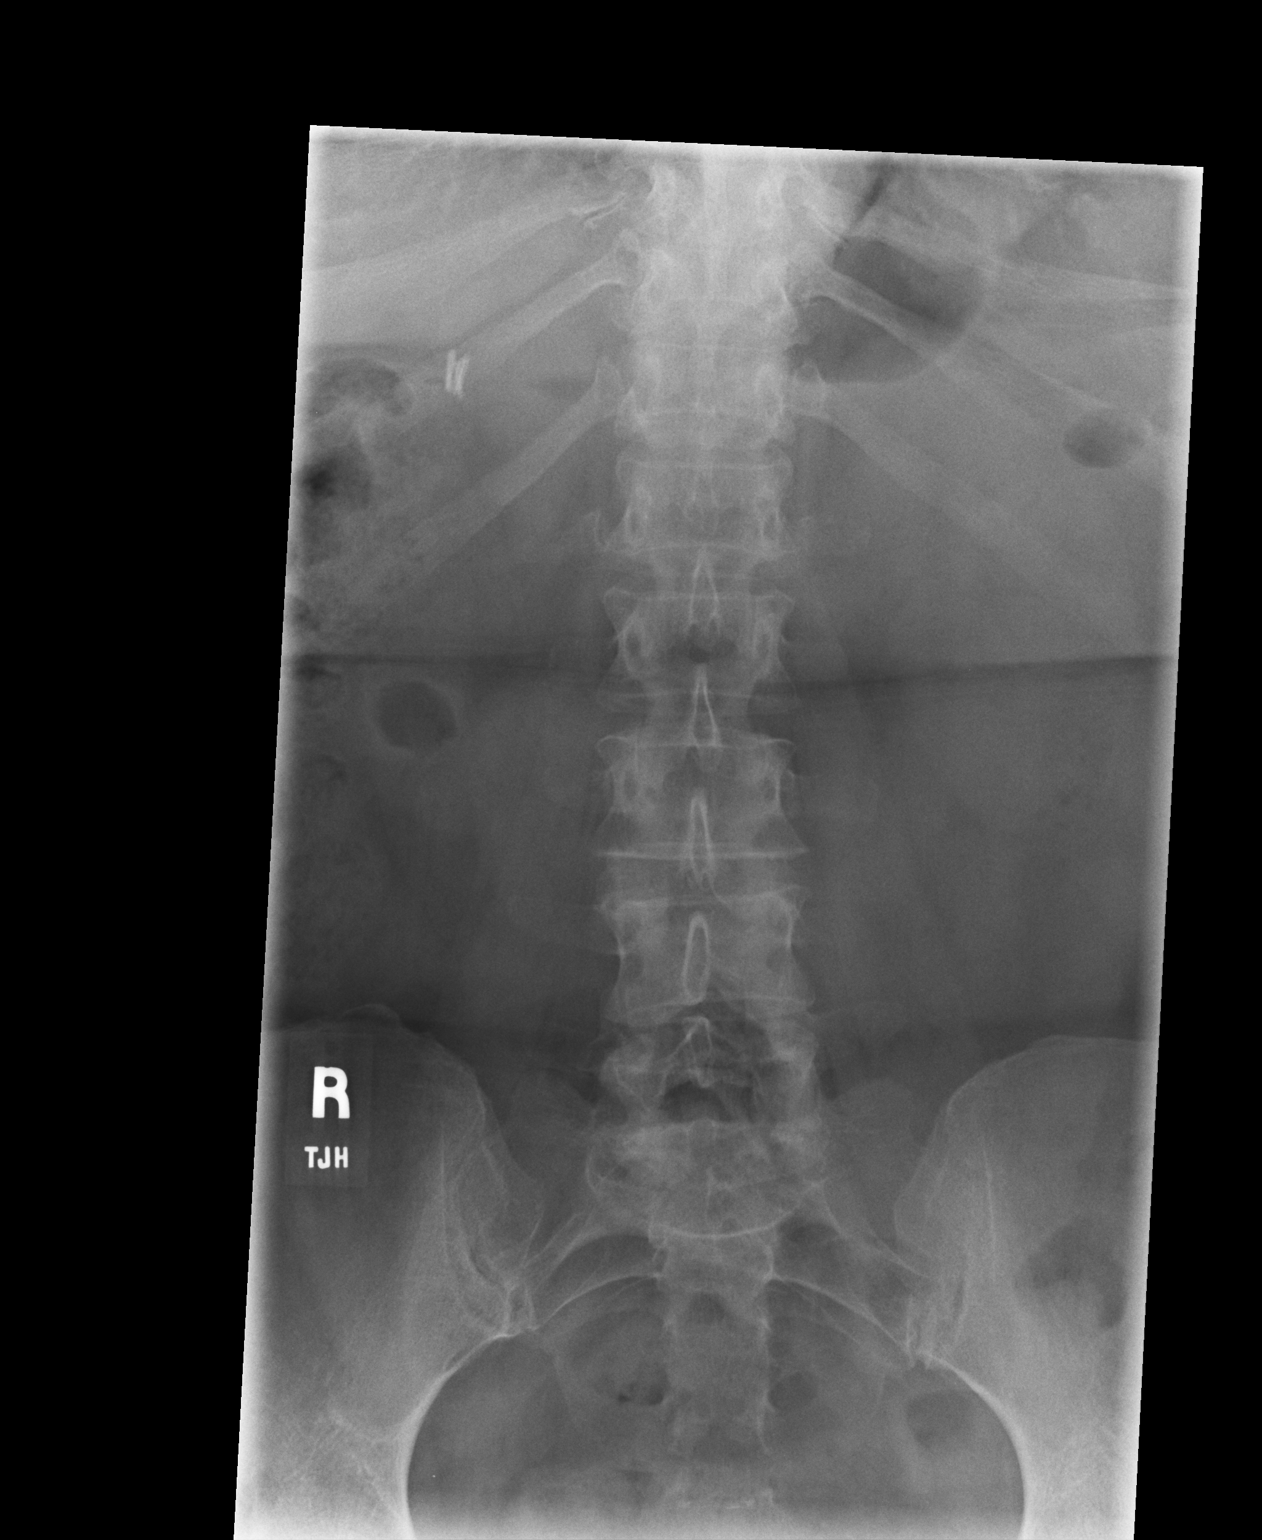

[lateral]
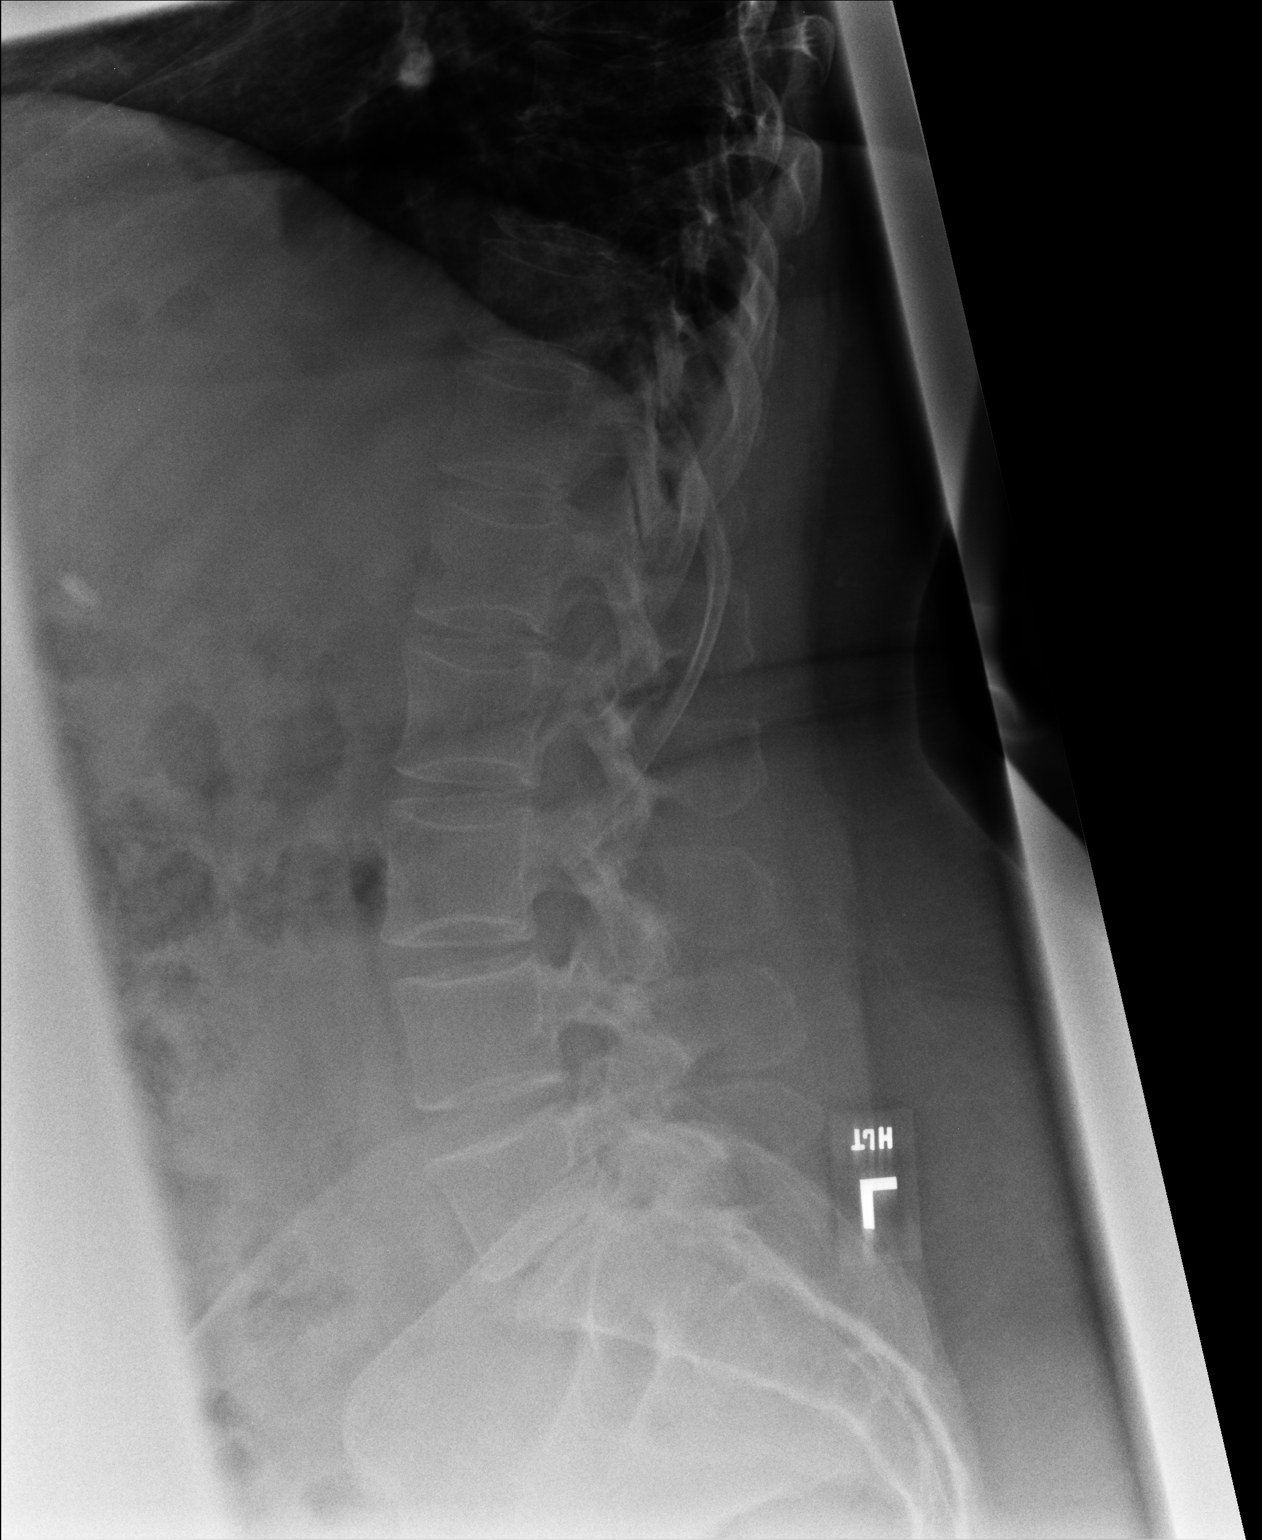

[2 of 2 positions shown; findings below may reference images not displayed]

FINDINGS: Two views of the lumbar spine submitted.  No acute
fracture or subluxation.  There is disc space flattening at L5 S1
level. Post cholecystectomy surgical clips.
IMPRESSION: No acute fracture or subluxation.  Disc space flattening at L5 S1
level.

## 2014-02-25 ENCOUNTER — Other Ambulatory Visit: Payer: Self-pay | Admitting: Dermatology

## 2014-04-02 ENCOUNTER — Ambulatory Visit (INDEPENDENT_AMBULATORY_CARE_PROVIDER_SITE_OTHER): Payer: Federal, State, Local not specified - PPO | Admitting: Obstetrics & Gynecology

## 2014-04-02 ENCOUNTER — Encounter: Payer: Self-pay | Admitting: Obstetrics & Gynecology

## 2014-04-02 VITALS — BP 128/74 | HR 72 | Ht 65.5 in | Wt 268.0 lb

## 2014-04-02 DIAGNOSIS — Z0000 Encounter for general adult medical examination without abnormal findings: Secondary | ICD-10-CM

## 2014-04-02 DIAGNOSIS — Z01419 Encounter for gynecological examination (general) (routine) without abnormal findings: Secondary | ICD-10-CM

## 2014-04-02 DIAGNOSIS — R6889 Other general symptoms and signs: Secondary | ICD-10-CM

## 2014-04-02 DIAGNOSIS — R899 Unspecified abnormal finding in specimens from other organs, systems and tissues: Secondary | ICD-10-CM

## 2014-04-02 LAB — POCT URINALYSIS DIPSTICK
BILIRUBIN UA: NEGATIVE
GLUCOSE UA: NEGATIVE
Ketones, UA: NEGATIVE
NITRITE UA: NEGATIVE
Protein, UA: NEGATIVE
RBC UA: NEGATIVE
Urobilinogen, UA: NEGATIVE
pH, UA: 5

## 2014-04-02 LAB — HEMOGLOBIN, FINGERSTICK: HEMOGLOBIN, FINGERSTICK: 14.3 g/dL (ref 12.0–16.0)

## 2014-04-02 MED ORDER — CLOBETASOL PROPIONATE 0.05 % EX OINT: 1.0000 "application " | TOPICAL_OINTMENT | Freq: Two times a day (BID) | CUTANEOUS | Status: DC

## 2014-04-02 NOTE — Progress Notes (Signed)
58 y.o. G0P0 MarriedCaucasianF here for new patient exam.  Prior patient of Dr. Cherylann Banas.  Hasn't had an exam since 2013.  Stopped her HRT last year after having concerns about being on it.  Reports she has enlarged labia and recurrent itching.  Has considered labial reduction.  Has the sensation that she smells smoke almost all the time.  Non smoker and no smokers in her home.  She reports she had this in the winter and then it resolved and is not back.   No LMP recorded. Patient has had a hysterectomy.          Sexually active: No.  The current method of family planning is status post hysterectomy.    Exercising: No.  The patient does not participate in regular exercise at present. Smoker:  no  Health Maintenance: Pap:  01/12/2011 neg HR HPV History of abnormal Pap:  no MMG:  Over due.  Pt thinks at least 2013. Colonoscopy:  Over 6 or 7 years wnl per pt BMD:   01/18/12 normal TDaP:  08/14/2010 Screening Labs:in office today, Hb today: 14.3, Urine today: small WBC ph: 5.0   reports that she quit smoking about 6 years ago. She has never used smokeless tobacco. She reports that she drinks about one ounce of alcohol per week. She reports that she does not use illicit drugs.  Past Medical History  Diagnosis Date  . Joint pain   . SOB (shortness of breath)   . Fatigue   . Chest pain   . Obesity   . Palpitation   . Endometriosis   . Uterine fibroid   . History of dysmenorrhea   . H/O: menorrhagia   . Depression   . GERD (gastroesophageal reflux disease)   . Hypertension   . Allergy   . Arthritis   . Osteopenia   . Sleep apnea   . Urinary incontinence     Past Surgical History  Procedure Laterality Date  . Laparoscopic ovarian cystectomy  1990  . Rhinoplasty    . Knee arthroscopy      left knee  . Abdominal hysterectomy      TAH  . Salpingoophorectomy  08/10/2000    RSO,Cystotomy  . Breast surgery      Biopsy-Benign  . Cholecystectomy    . Abdominal surgery       Fibroid removed    Current Outpatient Prescriptions  Medication Sig Dispense Refill  . amLODipine (NORVASC) 10 MG tablet TAKE 1 TABLET (10 MG TOTAL) BY MOUTH DAILY.  90 tablet  3  . celecoxib (CELEBREX) 200 MG capsule Take 200 mg by mouth daily.      . Cholecalciferol (VITAMIN D3) 1000 UNITS CAPS Take by mouth.        . Loratadine (CLARITIN PO) Take by mouth.        Marland Kitchen UNABLE TO FIND C PAP for sleep apnea       . venlafaxine (EFFEXOR) 75 MG tablet Take 1 tablet (75 mg total) by mouth daily.  90 tablet  4  . albuterol (PROVENTIL HFA;VENTOLIN HFA) 108 (90 BASE) MCG/ACT inhaler Inhale 2 puffs into the lungs every 6 (six) hours as needed for wheezing.  1 Inhaler  0  . aspirin 81 MG tablet Take 81 mg by mouth daily.        Marland Kitchen estradiol (ESTRACE) 1 MG tablet Take 1 tablet (1 mg total) by mouth daily.  90 tablet  4  . Omega-3 Fatty Acids (FISH OIL PO) Take by  mouth.         No current facility-administered medications for this visit.    Family History  Problem Relation Age of Onset  . Breast cancer Mother     Age 42  . Hypertension Mother   . Hypertension Father   . Arthritis Father   . Heart disease Father   . Cancer Father     Spleen  . Heart disease Maternal Grandfather   . Colon cancer Paternal Grandfather     Colon cancer    ROS:  Pertinent items are noted in HPI.  Otherwise, a comprehensive ROS was negative.  Exam:   BP 128/74  Pulse 72  Ht 5' 5.5" (1.664 m)  Wt 268 lb (121.564 kg)  BMI 43.90 kg/m2 Height: 5' 5.5" (166.4 cm)  Ht Readings from Last 3 Encounters:  04/02/14 5' 5.5" (1.664 m)  04/30/13 5\' 6"  (1.676 m)  11/01/12 5\' 6"  (1.676 m)    General appearance: alert, cooperative and appears stated age Head: Normocephalic, without obvious abnormality, atraumatic Neck: no adenopathy, supple, symmetrical, trachea midline and thyroid normal to inspection and palpation Lungs: clear to auscultation bilaterally Breasts: normal appearance, no masses or tenderness Heart:  regular rate and rhythm Abdomen: soft, non-tender; bowel sounds normal; no masses,  no organomegaly Extremities: extremities normal, atraumatic, no cyanosis or edema Skin: Skin color, texture, turgor normal. No rashes or lesions Lymph nodes: Cervical, supraclavicular, and axillary nodes normal. No abnormal inguinal nodes palpated Neurologic: Grossly normal   Pelvic: External genitalia:  no lesions, very large labia minora with some hypopigmentation of external skin              Urethra:  normal appearing urethra with no masses, tenderness or lesions              Bartholins and Skenes: normal                 Vagina: normal appearing vagina with normal color and discharge, no lesions              Cervix: absent              Pap taken: No. Bimanual Exam:  Uterus:  uterus absent              Adnexa: no mass, fullness, tenderness               Rectovaginal: Confirms               Anus:  normal sphincter tone, no lesions  A:  Well Woman with normal exam Large labia minora H/O TAH/RSO 07/2000 Sensation of smelling smoke Vulvar itching  P:   Mammogram most likely due.  Will sign release to find out when last one was done pap smear not done.  H/O  TAH Clobetasol 0.05% ointment bId up to 14 days.  If itching continues, pt knows to call Considering labial reduction.  D/W pt findings on exam and possible surgical reduction Will discuss with PCP sensation of smelling smoke.  She is aware I really think this should be evaluated.  May consider trial of Claritin to see if sinus related. Colonoscopy release will be signed. return annually or prn  An After Visit Summary was printed and given to the patient.

## 2014-04-03 LAB — COMPREHENSIVE METABOLIC PANEL
ALK PHOS: 94 U/L (ref 39–117)
ALT: 18 U/L (ref 0–35)
AST: 18 U/L (ref 0–37)
Albumin: 4.4 g/dL (ref 3.5–5.2)
BILIRUBIN TOTAL: 0.5 mg/dL (ref 0.2–1.2)
BUN: 22 mg/dL (ref 6–23)
CO2: 28 mEq/L (ref 19–32)
CREATININE: 0.75 mg/dL (ref 0.50–1.10)
Calcium: 8.9 mg/dL (ref 8.4–10.5)
Chloride: 103 mEq/L (ref 96–112)
GLUCOSE: 88 mg/dL (ref 70–99)
Potassium: 4.3 mEq/L (ref 3.5–5.3)
Sodium: 139 mEq/L (ref 135–145)
Total Protein: 7 g/dL (ref 6.0–8.3)

## 2014-04-03 LAB — LIPID PANEL
CHOL/HDL RATIO: 3.2 ratio
Cholesterol: 224 mg/dL — ABNORMAL HIGH (ref 0–200)
HDL: 71 mg/dL (ref >39)
LDL CALC: 129 mg/dL — AB (ref 0–99)
TRIGLYCERIDES: 119 mg/dL (ref <150)
VLDL: 24 mg/dL (ref 0–40)

## 2014-04-03 LAB — VITAMIN D 25 HYDROXY (VIT D DEFICIENCY, FRACTURES): Vit D, 25-Hydroxy: 45 ng/mL (ref 30–89)

## 2014-04-03 LAB — TSH: TSH: 5.497 u[IU]/mL — AB (ref 0.350–4.500)

## 2014-04-05 NOTE — Addendum Note (Signed)
Addended by: Megan Salon on: 04/05/2014 08:52 PM   Modules accepted: Orders

## 2014-04-06 ENCOUNTER — Telehealth: Payer: Self-pay

## 2014-04-06 NOTE — Telephone Encounter (Signed)
Message copied by Robley Fries on Mon Apr 06, 2014  9:19 AM ------      Message from: Megan Salon      Created: Sun Apr 05, 2014  8:51 PM       Please call pt.  CMP nl.  Lipids mildly elevated--LDLs 129 but HDLs good.  TGs normal.  Repeat fasting one year.  TSH was mildly elevated (indicating possible hypothyroidism).  Repeat one month.  If still elevated, I will start her on thyroid medication.  Order placed. ------

## 2014-04-06 NOTE — Telephone Encounter (Signed)
Lmtcb//kn 

## 2014-04-10 NOTE — Telephone Encounter (Signed)
Patient notified.//kn 

## 2014-04-27 ENCOUNTER — Telehealth: Payer: Self-pay

## 2014-04-27 NOTE — Telephone Encounter (Signed)
Message copied by Robley Fries on Mon Apr 27, 2014 10:34 AM ------      Message from: Megan Salon      Created: Fri Apr 24, 2014  5:48 AM      Regarding: colonoscopy       Can you call pt and see where she had her colonoscopy.  No records from [place] for 6-7 years ago as she said when she was in office.  Thanks.            MSM ------

## 2014-04-27 NOTE — Telephone Encounter (Signed)
Lmtcb//kn 

## 2014-04-28 NOTE — Telephone Encounter (Signed)
Return call to kelly.

## 2014-04-30 ENCOUNTER — Other Ambulatory Visit (INDEPENDENT_AMBULATORY_CARE_PROVIDER_SITE_OTHER): Payer: Federal, State, Local not specified - PPO

## 2014-04-30 DIAGNOSIS — R6889 Other general symptoms and signs: Secondary | ICD-10-CM

## 2014-04-30 DIAGNOSIS — R899 Unspecified abnormal finding in specimens from other organs, systems and tissues: Secondary | ICD-10-CM

## 2014-04-30 NOTE — Telephone Encounter (Signed)
Spoke with patient-states she does not know where else she would have had this. States Dr Elder Cyphers should have a copy. Aware will call their office and let her know if they do not have any record.//kn

## 2014-05-01 ENCOUNTER — Other Ambulatory Visit: Payer: Self-pay | Admitting: Emergency Medicine

## 2014-05-01 LAB — TSH: TSH: 5.019 u[IU]/mL — ABNORMAL HIGH (ref 0.350–4.500)

## 2014-05-04 ENCOUNTER — Telehealth: Payer: Self-pay

## 2014-05-04 DIAGNOSIS — R7989 Other specified abnormal findings of blood chemistry: Secondary | ICD-10-CM

## 2014-05-04 MED ORDER — LEVOTHYROXINE SODIUM 50 MCG PO TABS: 50.0000 ug | ORAL_TABLET | Freq: Every day | ORAL | Status: DC

## 2014-05-04 NOTE — Telephone Encounter (Signed)
Spoke with patient. Advised of message as seen below from [place]. Patient is agreeable and verbalizes understanding. Patient is agreeable to start Synthroid at this time and have 6 week f/u. Patient is on her way out of town and requests rx [facility]. Rx for Synthroid 43mcg #30 2RF [facility] [place]. Follow up appointment scheduled for 9/2 at 8:30AM. Patient agreeable to date and time.  Routing to provider for final review. Patient agreeable to disposition. Will close encounter.

## 2014-05-04 NOTE — Telephone Encounter (Signed)
Message copied by Jasmine Awe on Mon May 04, 2014 12:15 PM ------      Message from: Megan Salon      Created: Sun May 03, 2014 11:37 PM       TSH still high, showing lower thyroid function--hypothyroidism.  I think she should start synthroid 52mcg daily and recheck TSH 6 weeks.  Of course, she could follow with PCP or endocrinologist if she's like another opinion before starting medication.  No orders placed yet. ------

## 2014-05-11 NOTE — Telephone Encounter (Signed)
Call to Dr Elder Cyphers office was informed we are on the same system and I should be able to look that up. Call to [facility] to see if they have any record-asked to fax over release again and they will check the chart. Release has been faxed. Waiting to hear back from their office.//kn

## 2014-05-13 ENCOUNTER — Telehealth: Payer: Self-pay | Admitting: *Deleted

## 2014-05-13 NOTE — Telephone Encounter (Signed)
Screening MMG scheduled at [facility] for 05/27/14 @ 1:00pm. Message left with patient's husband to return call to [place] at [phone].

## 2014-05-13 NOTE — Telephone Encounter (Signed)
Message copied by Graylon Good on Wed May 13, 2014  1:23 PM ------      Message from: Megan Salon      Created: Wed May 06, 2014 12:05 PM      Regarding: MMG due       Pt seen as new pt a month or two ago.  We've called her several times about abnormal labs and colonoscopy report.  She knew her MMG was overdue but I have finally gotten a copy of the last one--2011.  We need to schedule one for her at [facility] so she will go.  Can you call?  Pt goes to my church and is very nice.  I want her to know I'm not trying to bug her to death with the multiple phone calls--just trying to get everything done she needs.  Hopefully this will be all except for her thyroid f/u.  Also, I haven't gotten a colonoscopy report on her yet.  She was going to call the office where she thought it was done.  Thanks.            MSM ------

## 2014-05-13 NOTE — Telephone Encounter (Signed)
Pt returned call.  Informed MMG appt has been scheduled.  Pt given date and time.  She is agreeable.

## 2014-05-19 NOTE — Telephone Encounter (Signed)
We can't see some of the reports if they are old and hers was six to seven years ago.  Thanks for working on this.

## 2014-05-25 ENCOUNTER — Encounter: Payer: Self-pay | Admitting: Cardiology

## 2014-06-15 ENCOUNTER — Other Ambulatory Visit: Payer: Federal, State, Local not specified - PPO

## 2014-06-15 DIAGNOSIS — R946 Abnormal results of thyroid function studies: Secondary | ICD-10-CM

## 2014-06-15 DIAGNOSIS — R7989 Other specified abnormal findings of blood chemistry: Secondary | ICD-10-CM

## 2014-06-15 LAB — TSH: TSH: 2.645 u[IU]/mL (ref 0.350–4.500)

## 2014-06-15 NOTE — Telephone Encounter (Signed)
Patient aware that I still have not received any report from [place]. She will check her records at home and see if she has a copy of report. If so, she will drop off a copy to [name]. If not, ? Do we need to schedule one for her? Please advise.//kn

## 2014-06-17 NOTE — Telephone Encounter (Signed)
I trust that she had already had one and follow up was to be in 10 years.  So I would just have her do another one at age 58.  Thanks.  Ok to stop looking for records.

## 2014-06-18 ENCOUNTER — Other Ambulatory Visit: Payer: Self-pay

## 2014-06-18 ENCOUNTER — Encounter: Payer: Self-pay | Admitting: Internal Medicine

## 2014-06-18 MED ORDER — LEVOTHYROXINE SODIUM 50 MCG PO TABS: 50.0000 ug | ORAL_TABLET | Freq: Every day | ORAL | Status: DC

## 2014-06-18 NOTE — Telephone Encounter (Signed)
Patent notified of all results. Aware to continue taking the current dose of synthroid and we will recheck labs next year. Will need new rx-pending, please advise if this is correct.//kn

## 2014-06-18 NOTE — Telephone Encounter (Signed)
-----   Message from Lyman Speller, MD sent at 06/17/2014  2:36 PM EST ----- Inform TSH is normal now.  I will repeat next year at her AEX since it was abnormal at last visit.  Thanks.

## 2014-07-22 ENCOUNTER — Other Ambulatory Visit: Payer: Self-pay | Admitting: Emergency Medicine

## 2014-08-09 ENCOUNTER — Other Ambulatory Visit: Payer: Self-pay | Admitting: Physician Assistant

## 2014-08-18 ENCOUNTER — Other Ambulatory Visit: Payer: Self-pay

## 2014-08-18 MED ORDER — VENLAFAXINE HCL 75 MG PO TABS: 75.0000 mg | ORAL_TABLET | Freq: Every day | ORAL | Status: DC

## 2014-08-25 ENCOUNTER — Other Ambulatory Visit: Payer: Self-pay | Admitting: Obstetrics & Gynecology

## 2014-08-25 NOTE — Telephone Encounter (Signed)
Medication refill request: Synthroid 50 mcg Last AEX:  04/02/14 Next AEX: 04/27/15 Last MMG (if hormonal medication request): 05/27/14 BIRADS0:Incomplete. 06/04/14  [name] BIRADS2:benign  Refill authorized: 06/18/14 #30/9R. Today #30/9R?

## 2014-08-29 ENCOUNTER — Other Ambulatory Visit: Payer: Self-pay | Admitting: Physician Assistant

## 2014-08-31 MED ORDER — VENLAFAXINE HCL 75 MG PO TABS: 75.0000 mg | ORAL_TABLET | Freq: Every day | ORAL | Status: DC

## 2014-08-31 NOTE — Telephone Encounter (Signed)
Called pt and notified she needs OV. She agreed and transferred her to 104 for appt. Her appt is 09/23/14 and I am RFing venlafaxine and amlodipine until then per protocol.

## 2014-09-23 ENCOUNTER — Ambulatory Visit (INDEPENDENT_AMBULATORY_CARE_PROVIDER_SITE_OTHER): Payer: Federal, State, Local not specified - PPO | Admitting: Family Medicine

## 2014-09-23 ENCOUNTER — Encounter: Payer: Self-pay | Admitting: Family Medicine

## 2014-09-23 VITALS — BP 128/90 | HR 79 | Temp 97.9°F | Resp 16 | Ht 65.75 in | Wt 279.4 lb

## 2014-09-23 DIAGNOSIS — I10 Essential (primary) hypertension: Secondary | ICD-10-CM

## 2014-09-23 MED ORDER — AMLODIPINE BESYLATE 10 MG PO TABS: 10.0000 mg | ORAL_TABLET | Freq: Every day | ORAL | Status: DC

## 2014-09-23 MED ORDER — VENLAFAXINE HCL 75 MG PO TABS: 75.0000 mg | ORAL_TABLET | Freq: Every day | ORAL | Status: DC

## 2014-09-23 NOTE — Progress Notes (Signed)
   Subjective:    Patient ID: Cheryl [name], female    DOB: 22-Feb-1956, 59 y.o.   MRN: [id number]  HPI This is a very pleasant 59 yo female who presents today for refills on medication. Has regular gyn care and is up to date on PAP, mammo, colonoscopy.  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. She enjoys her work and it has helped her increase her activity level.   Doing well on effexor and norvasc. Gyn monitors her TSH and prescribes her synthroid.    Review of Systems No chest pain, no SOB. Occasional swelling of feet at end of day.     Objective:   Physical Exam  Constitutional: She is oriented to person, place, and time. She appears well-developed and well-nourished.  Obese   HENT:  Head: Normocephalic and atraumatic.  Eyes: Conjunctivae are normal.  Neck: Normal range of motion. Neck supple.  Cardiovascular: Normal rate, regular rhythm and normal heart sounds.   Pulmonary/Chest: Effort normal and breath sounds normal.  Musculoskeletal: Normal range of motion. Edema: trace pretibial.  Neurological: She is alert and oriented to person, place, and time.  Skin: Skin is warm and dry.  Psychiatric: She has a normal mood and affect. Her behavior is normal. Judgment and thought content normal.  Vitals reviewed.  BP 128/90 mmHg  Pulse 79  Temp(Src) 97.9 F (36.6 C) (Oral)  Resp 16  Ht 5' 5.75" (1.67 m)  Wt 279 lb 6.4 oz (126.735 kg)  BMI 45.44 kg/m2  SpO2 95%    Assessment & Plan:  1. Essential hypertension - amLODipine (NORVASC) 10 MG tablet; Take 1 tablet (10 mg total) by mouth daily.  Dispense: 90 tablet; Refill: 2 - Comprehensive metabolic panel - Microalbumin, urine - discussed importance of following up HTN every 6 months. Has regular gyn appointments and will space out appointments so she is being seen regularly for monitoring of her HTN.   Elby Beck, FNP-BC  [facility]  09/24/2014 11:10  PM

## 2014-09-23 NOTE — Patient Instructions (Signed)
Follow up with gyn as scheduled 8/16, then make an appointment for follow up of your blood pressure before you are out of your refills.

## 2014-09-24 LAB — COMPREHENSIVE METABOLIC PANEL
ALBUMIN: 4.4 g/dL (ref 3.5–5.2)
ALT: 18 U/L (ref 0–35)
AST: 18 U/L (ref 0–37)
Alkaline Phosphatase: 107 U/L (ref 39–117)
BUN: 24 mg/dL — ABNORMAL HIGH (ref 6–23)
CALCIUM: 8.8 mg/dL (ref 8.4–10.5)
CHLORIDE: 104 meq/L (ref 96–112)
CO2: 24 mEq/L (ref 19–32)
Creat: 0.68 mg/dL (ref 0.50–1.10)
Glucose, Bld: 95 mg/dL (ref 70–99)
Potassium: 3.9 mEq/L (ref 3.5–5.3)
Sodium: 138 mEq/L (ref 135–145)
Total Bilirubin: 0.4 mg/dL (ref 0.2–1.2)
Total Protein: 6.6 g/dL (ref 6.0–8.3)

## 2014-09-24 LAB — MICROALBUMIN, URINE: MICROALB UR: 0.4 mg/dL (ref <2.0)

## 2015-04-27 ENCOUNTER — Encounter: Payer: Self-pay | Admitting: Obstetrics & Gynecology

## 2015-04-27 ENCOUNTER — Other Ambulatory Visit (INDEPENDENT_AMBULATORY_CARE_PROVIDER_SITE_OTHER): Payer: Federal, State, Local not specified - PPO

## 2015-04-27 ENCOUNTER — Ambulatory Visit (INDEPENDENT_AMBULATORY_CARE_PROVIDER_SITE_OTHER): Payer: Federal, State, Local not specified - PPO | Admitting: Obstetrics & Gynecology

## 2015-04-27 VITALS — BP 132/80 | HR 72 | Resp 16 | Ht 65.5 in | Wt 274.0 lb

## 2015-04-27 DIAGNOSIS — Z01419 Encounter for gynecological examination (general) (routine) without abnormal findings: Secondary | ICD-10-CM

## 2015-04-27 DIAGNOSIS — Z0000 Encounter for general adult medical examination without abnormal findings: Secondary | ICD-10-CM | POA: Diagnosis not present

## 2015-04-27 LAB — POCT URINALYSIS DIPSTICK
Bilirubin, UA: NEGATIVE
Blood, UA: NEGATIVE
GLUCOSE UA: NEGATIVE
Ketones, UA: NEGATIVE
Leukocytes, UA: NEGATIVE
Nitrite, UA: NEGATIVE
Protein, UA: NEGATIVE
UROBILINOGEN UA: NEGATIVE
pH, UA: 5

## 2015-04-27 LAB — COMPREHENSIVE METABOLIC PANEL
ALBUMIN: 4.2 g/dL (ref 3.6–5.1)
ALT: 16 U/L (ref 6–29)
AST: 17 U/L (ref 10–35)
Alkaline Phosphatase: 103 U/L (ref 33–130)
BILIRUBIN TOTAL: 0.6 mg/dL (ref 0.2–1.2)
BUN: 14 mg/dL (ref 7–25)
CALCIUM: 9.1 mg/dL (ref 8.6–10.4)
CHLORIDE: 105 mmol/L (ref 98–110)
CO2: 25 mmol/L (ref 20–31)
CREATININE: 0.68 mg/dL (ref 0.50–1.05)
Glucose, Bld: 98 mg/dL (ref 65–99)
Potassium: 4.3 mmol/L (ref 3.5–5.3)
SODIUM: 140 mmol/L (ref 135–146)
TOTAL PROTEIN: 6.9 g/dL (ref 6.1–8.1)

## 2015-04-27 LAB — LIPID PANEL
CHOLESTEROL: 240 mg/dL — AB (ref 125–200)
HDL: 65 mg/dL (ref >=46)
LDL Cholesterol: 149 mg/dL — ABNORMAL HIGH (ref <130)
TRIGLYCERIDES: 129 mg/dL (ref <150)
Total CHOL/HDL Ratio: 3.7 Ratio (ref <=5.0)
VLDL: 26 mg/dL (ref <30)

## 2015-04-27 LAB — TSH: TSH: 4.431 u[IU]/mL (ref 0.350–4.500)

## 2015-04-27 MED ORDER — DOXYCYCLINE HYCLATE 100 MG PO CAPS: 100.0000 mg | ORAL_CAPSULE | Freq: Two times a day (BID) | ORAL | Status: DC

## 2015-04-27 NOTE — Progress Notes (Signed)
59 y.o. G0P0 MarriedCaucasianF here for annual exam.  Doing well.  No vaginal bleeding.    Pt reported smelling a smokey smell last year.  Reports this went away and she did not go for additional evaluation.    D/W pt weight gain.  She is very frustrated with herself and weight.  Knows she has 75-100 pounds to lose.  Would like suggestions.  Bariatric options discussed.  Questions answered.    No LMP recorded. Patient has had a hysterectomy.          Sexually active: No.  The current method of family planning is status post hysterectomy.    Exercising: No.  not regularly Smoker:  Former smoker   Health Maintenance: Pap:  01/12/11 WNL/negative HR HPV History of abnormal Pap:  no MMG:  05/27/14 MMG, 06/04/14 US-BiRads 2-negative Colonoscopy:  7-8 years ago-repeat in 10 years BMD:   01/18/12-normal TDaP:  08/14/10 Screening Labs: here, Hb today: here, Urine today: negative   reports that she quit smoking about 7 years ago. She has never used smokeless tobacco. She reports that she drinks about 1.8 - 2.4 oz of alcohol per week. She reports that she does not use illicit drugs.  Past Medical History  Diagnosis Date  . Joint pain   . SOB (shortness of breath)   . Fatigue   . Chest pain   . Obesity   . Palpitation   . Depression   . GERD (gastroesophageal reflux disease)   . Hypertension   . Allergy   . Arthritis   . Osteopenia   . Sleep apnea   . Urinary incontinence     Past Surgical History  Procedure Laterality Date  . Rhinoplasty    . Knee arthroscopy      left knee  . Abdominal hysterectomy  08/10/2000    TAH/RSO  . Breast surgery      Biopsy-Benign  . Laparoscopic cholecystectomy  2010  . Laparotomy  1990    Fibroid removed    Current Outpatient Prescriptions  Medication Sig Dispense Refill  . amLODipine (NORVASC) 10 MG tablet Take 1 tablet (10 mg total) by mouth daily. 90 tablet 2  . aspirin 81 MG tablet Take 81 mg by mouth daily.      . Cholecalciferol (VITAMIN  D3) 1000 UNITS CAPS Take by mouth.      Marland Kitchen ketoconazole (NIZORAL) 2 % cream APPLY 1 APPLICATION TO AFFECTED AREA OF FEET BID  0  . Loratadine (CLARITIN PO) Take by mouth.      Marland Kitchen UNABLE TO FIND C PAP for sleep apnea     . venlafaxine (EFFEXOR) 75 MG tablet Take 1 tablet (75 mg total) by mouth daily. 90 tablet 2   No current facility-administered medications for this visit.    Family History  Problem Relation Age of Onset  . Breast cancer Mother     Age 59  . Hypertension Mother   . Hypertension Father   . Arthritis Father   . Heart disease Father   . Cancer Father     Spleen  . Heart disease Maternal Grandfather   . Colon cancer Paternal Grandfather     Colon cancer    ROS:  Pertinent items are noted in HPI.  Otherwise, a comprehensive ROS was negative.  Exam:   BP 132/80 mmHg  Pulse 72  Resp 16  Ht 5' 5.5" (1.664 m)  Wt 274 lb (124.286 kg)  BMI 44.89 kg/m2  Weight change: +6#  Height: 5'  5.5" (166.4 cm)  Ht Readings from Last 3 Encounters:  04/27/15 5' 5.5" (1.664 m)  09/23/14 5' 5.75" (1.67 m)  04/02/14 5' 5.5" (1.664 m)    General appearance: alert, cooperative and appears stated age Head: Normocephalic, without obvious abnormality, atraumatic Neck: no adenopathy, supple, symmetrical, trachea midline and thyroid normal to inspection and palpation Lungs: clear to auscultation bilaterally Breasts: normal appearance, no masses or tenderness Heart: regular rate and rhythm Abdomen: soft, non-tender; bowel sounds normal; no masses,  no organomegaly Extremities: extremities normal, atraumatic, no cyanosis or edema Skin: Skin color, texture, turgor normal. No rashes or lesions Lymph nodes: Cervical, supraclavicular, and axillary nodes normal. No abnormal inguinal nodes palpated Neurologic: Grossly normal   Pelvic: External genitalia:  no lesions              Urethra:  normal appearing urethra with no masses, tenderness or lesions              Bartholins and Skenes:  normal                 Vagina: normal appearing vagina with normal color and discharge, no lesions              Cervix: no lesions              Pap taken: No. Bimanual Exam:  Uterus:  uterus absent              Adnexa: normal adnexa and no mass, fullness, tenderness               Rectovaginal: Confirms               Anus:  normal sphincter tone, no lesions  Chaperone was present for exam.  A:  Well Woman with normal exam Large labia minora H/O TAH/RSO 07/2000 Morbid obesity Hypertension  P: Mammogram yearly No pap smear. H/OTAH for fibroids/endometriosis Information about bariatric surgery given.   return annually or prn

## 2015-04-28 LAB — VITAMIN D 25 HYDROXY (VIT D DEFICIENCY, FRACTURES): Vit D, 25-Hydroxy: 36 ng/mL (ref 30–100)

## 2015-04-29 ENCOUNTER — Telehealth: Payer: Self-pay

## 2015-04-29 NOTE — Telephone Encounter (Signed)
Patient returning your call.

## 2015-04-29 NOTE — Telephone Encounter (Signed)
Lmtcb//kn 

## 2015-04-29 NOTE — Telephone Encounter (Signed)
-----   Message from Megan Salon, MD sent at 04/28/2015  7:41 AM EDT ----- Please let her know her CMP and Vit D were normal.  Lipids are elevated.  Total 240 but LDLs are 149.  This is getting close to the place where she will need treatment if increases further.  We discussed a while yesterday about bariatric surgery.  This would normalize with weight loss.  Will recheck next year.  Her TSH is normal but is bouncing around still.  I would repeat in six months but am happy to let her see a specialist if she would like another opinion about this or have an endocrinologist follow this level.    Thanks.

## 2015-04-30 NOTE — Telephone Encounter (Signed)
Felecia from [place] calling to get more information for patient so she can fax it over.

## 2015-05-03 ENCOUNTER — Other Ambulatory Visit: Payer: Self-pay | Admitting: Obstetrics & Gynecology

## 2015-05-03 NOTE — Telephone Encounter (Signed)
[place] for referral if pt desires.  Let me know please.

## 2015-05-03 NOTE — Telephone Encounter (Signed)
Spoke with Solmon Ice, states patient's last colonoscopy was definitely in 2000. Will inform patient and see if she would like to schedule one or [name] to.//kn

## 2015-05-04 NOTE — Telephone Encounter (Signed)
Left detailed message asking if she wanted [name] to refer her back to [facility] or another GI practice.//kn

## 2015-05-27 ENCOUNTER — Encounter (HOSPITAL_COMMUNITY): Payer: Self-pay | Admitting: *Deleted

## 2015-05-27 ENCOUNTER — Emergency Department (HOSPITAL_COMMUNITY): Payer: Federal, State, Local not specified - PPO

## 2015-05-27 ENCOUNTER — Emergency Department (HOSPITAL_COMMUNITY)
Admission: EM | Admit: 2015-05-27 | Discharge: 2015-05-27 | Disposition: A | Discharge status: Home / Self Care | Payer: Federal, State, Local not specified - PPO | Attending: Emergency Medicine | Admitting: Emergency Medicine

## 2015-05-27 DIAGNOSIS — M8580 Other specified disorders of bone density and structure, unspecified site: Secondary | ICD-10-CM | POA: Insufficient documentation

## 2015-05-27 DIAGNOSIS — S61012A Laceration without foreign body of left thumb without damage to nail, initial encounter: Secondary | ICD-10-CM | POA: Insufficient documentation

## 2015-05-27 DIAGNOSIS — Y998 Other external cause status: Secondary | ICD-10-CM | POA: Insufficient documentation

## 2015-05-27 DIAGNOSIS — I10 Essential (primary) hypertension: Secondary | ICD-10-CM | POA: Insufficient documentation

## 2015-05-27 DIAGNOSIS — Z79899 Other long term (current) drug therapy: Secondary | ICD-10-CM | POA: Insufficient documentation

## 2015-05-27 DIAGNOSIS — F329 Major depressive disorder, single episode, unspecified: Secondary | ICD-10-CM | POA: Diagnosis not present

## 2015-05-27 DIAGNOSIS — Z8719 Personal history of other diseases of the digestive system: Secondary | ICD-10-CM | POA: Diagnosis not present

## 2015-05-27 DIAGNOSIS — IMO0002 Reserved for concepts with insufficient information to code with codable children: Secondary | ICD-10-CM

## 2015-05-27 DIAGNOSIS — Y9289 Other specified places as the place of occurrence of the external cause: Secondary | ICD-10-CM | POA: Diagnosis not present

## 2015-05-27 DIAGNOSIS — Z8669 Personal history of other diseases of the nervous system and sense organs: Secondary | ICD-10-CM | POA: Insufficient documentation

## 2015-05-27 DIAGNOSIS — E669 Obesity, unspecified: Secondary | ICD-10-CM | POA: Diagnosis not present

## 2015-05-27 DIAGNOSIS — S6992XA Unspecified injury of left wrist, hand and finger(s), initial encounter: Secondary | ICD-10-CM | POA: Diagnosis present

## 2015-05-27 DIAGNOSIS — W260XXA Contact with knife, initial encounter: Secondary | ICD-10-CM | POA: Diagnosis not present

## 2015-05-27 DIAGNOSIS — Z87891 Personal history of nicotine dependence: Secondary | ICD-10-CM | POA: Insufficient documentation

## 2015-05-27 DIAGNOSIS — Y9389 Activity, other specified: Secondary | ICD-10-CM | POA: Diagnosis not present

## 2015-05-27 MED ORDER — LIDOCAINE HCL (PF) 1 % IJ SOLN
5.0000 mL | Freq: Once | INTRAMUSCULAR | Status: AC
  Administered 2015-05-27: 5 mL via INTRADERMAL
  Filled 2015-05-27: qty 5

## 2015-05-27 NOTE — ED Notes (Signed)
Pt alert and oriented x4. Respirations even and unlabored, bilateral symmetrical rise and fall of chest. Skin warm and dry. In no acute distress. Denies needs.   

## 2015-05-27 NOTE — Discharge Instructions (Signed)
Return in 10 days for suture removal to this department or urgent care. Laceration Care, Adult   A laceration is a cut that goes through all of the layers of the skin and into the tissue that is right under the skin. Some lacerations heal on their own. Others need to be closed with stitches (sutures), staples, skin adhesive strips, or skin glue. Proper laceration care minimizes the risk of infection and helps the laceration to heal better.  HOW TO CARE FOR YOUR LACERATION  If sutures or staples were used:  Keep the wound clean and dry.  If you were given a bandage (dressing), you should change it at least one time per day or as told by your health care provider. You should also change it if it becomes wet or dirty.  Keep the wound completely dry for the first 24 hours or as told by your health care provider. After that time, you may shower or bathe. However, make sure that the wound is not soaked in water until after the sutures or staples have been removed.  Clean the wound one time each day or as told by your health care provider:  Wash the wound with soap and water.  Rinse the wound with water to remove all soap.  Pat the wound dry with a clean towel. Do not rub the wound. After cleaning the wound, apply a thin layer of antibiotic ointment as told by your health care provider. This will help to prevent infection and keep the dressing from sticking to the wound.  Have the sutures or staples removed as told by your health care provider. If skin adhesive strips were used:  Keep the wound clean and dry.  If you were given a bandage (dressing), you should change it at least one time per day or as told by your health care provider. You should also change it if it becomes dirty or wet.  Do not get the skin adhesive strips wet. You may shower or bathe, but be careful to keep the wound dry.  If the wound gets wet, pat it dry with a clean towel. Do not rub the wound.  Skin adhesive strips fall off on  their own. You may trim the strips as the wound heals. Do not remove skin adhesive strips that are still stuck to the wound. They will fall off in time. If skin glue was used:  Try to keep the wound dry, but you may briefly wet it in the shower or bath. Do not soak the wound in water, such as by swimming.  After you have showered or bathed, gently pat the wound dry with a clean towel. Do not rub the wound.  Do not do any activities that will make you sweat heavily until the skin glue has fallen off on its own.  Do not apply liquid, cream, or ointment medicine to the wound while the skin glue is in place. Using those may loosen the film before the wound has healed.  If you were given a bandage (dressing), you should change it at least one time per day or as told by your health care provider. You should also change it if it becomes dirty or wet.  If a dressing is placed over the wound, be careful not to apply tape directly over the skin glue. Doing that may cause the glue to be pulled off before the wound has healed.  Do not pick at the glue. The skin glue usually remains in place  for 5-10 days, then it falls off of the skin. General Instructions  Take over-the-counter and prescription medicines only as told by your health care provider.  If you were prescribed an antibiotic medicine or ointment, take or apply it as told by your doctor. Do not stop using it even if your condition improves.  To help prevent scarring, make sure to cover your wound with sunscreen whenever you are outside after stitches are removed, after adhesive strips are removed, or when glue remains in place and the wound is healed. Make sure to wear a sunscreen of at least 30 SPF.  Do not scratch or pick at the wound.  Keep all follow-up visits as told by your health care provider. This is important.  Check your wound every day for signs of infection. Watch for:  Redness, swelling, or pain.  Fluid, blood, or pus. Raise (elevate)  the injured area above the level of your heart while you are sitting or lying down, if possible. SEEK MEDICAL CARE IF:  You received a tetanus shot and you have swelling, severe pain, redness, or bleeding at the injection site.  You have a fever.  A wound that was closed breaks open.  You notice a bad smell coming from your wound or your dressing.  You notice something coming out of the wound, such as wood or glass.  Your pain is not controlled with medicine.  You have increased redness, swelling, or pain at the site of your wound.  You have fluid, blood, or pus coming from your wound.  You notice a change in the color of your skin near your wound.  You need to change the dressing frequently due to fluid, blood, or pus draining from the wound.  You develop a new rash.  You develop numbness around the wound. SEEK IMMEDIATE MEDICAL CARE IF:  You develop severe swelling around the wound.  Your pain suddenly increases and is severe.  You develop painful lumps near the wound or on skin that is anywhere on your body.  You have a red streak going away from your wound.  The wound is on your hand or foot and you cannot properly move a finger or toe.  The wound is on your hand or foot and you notice that your fingers or toes look pale or bluish. This information is not intended to replace advice given to you by your health care provider. Make sure you discuss any questions you have with your health care provider.  Document Released: 07/31/2005 Document Revised: 12/15/2014 Document Reviewed: 07/27/2014  Elsevier Interactive Patient Education [employer].

## 2015-05-27 NOTE — ED Provider Notes (Signed)
CSN: [id number]     Arrival date & time 05/27/15  0732 History   First MD Initiated Contact with Patient 05/27/15 [phone]     Chief Complaint  Patient presents with  . Laceration     (Consider location/radiation/quality/duration/timing/severity/associated sxs/prior Treatment) Patient is a 59 y.o. female presenting with skin laceration. The history is provided by the patient.  Laceration Location:  Finger Finger laceration location:  L thumb Depth:  Through dermis Quality: straight   Bleeding: controlled with pressure   Time since incident:  2 hours Laceration mechanism:  Knife Pain details:    Severity:  Mild Foreign body present:  No foreign bodies Relieved by:  Pressure Tetanus status:  Up to date   Past Medical History  Diagnosis Date  . Joint pain   . SOB (shortness of breath)   . Fatigue   . Chest pain   . Obesity   . Palpitation   . Depression   . GERD (gastroesophageal reflux disease)   . Hypertension   . Allergy   . Arthritis   . Osteopenia   . Sleep apnea   . Urinary incontinence    Past Surgical History  Procedure Laterality Date  . Rhinoplasty    . Knee arthroscopy      left knee  . Abdominal hysterectomy  08/10/2000    TAH/RSO  . Breast surgery      Biopsy-Benign  . Laparoscopic cholecystectomy  2010  . Laparotomy  1990    Fibroid removed   Family History  Problem Relation Age of Onset  . Breast cancer Mother     Age 60  . Hypertension Mother   . Hypertension Father   . Arthritis Father   . Heart disease Father   . Cancer Father     Spleen  . Heart disease Maternal Grandfather   . Colon cancer Paternal Grandfather     Colon cancer   Social History  Substance Use Topics  . Smoking status: Former Smoker    Quit date: 08/15/2007  . Smokeless tobacco: Never Used  . Alcohol Use: 1.8 - 2.4 oz/week    3-4 Standard drinks or equivalent per week     Comment: MODERATELY   OB History    Gravida Para Term Preterm AB TAB SAB Ectopic  Multiple Living   0              Review of Systems  Constitutional: Negative for fever and chills.  Respiratory: Negative for shortness of breath.   Cardiovascular: Negative for chest pain.  Gastrointestinal: Negative for nausea, vomiting and diarrhea.  Neurological: Negative for dizziness and light-headedness.      Allergies  Ace inhibitors; Lisinopril; Z-pak; and Neosporin  Home Medications   Prior to Admission medications   Medication Sig Start Date End Date Taking? Authorizing Provider  amLODipine (NORVASC) 10 MG tablet Take 1 tablet (10 mg total) by mouth daily. 09/23/14  Yes Elby Beck, FNP  Cholecalciferol (VITAMIN D3) 1000 UNITS CAPS Take 1 capsule by mouth daily.    Yes Historical Provider, MD  ketoconazole (NIZORAL) 2 % cream APPLY 1 APPLICATION TO AFFECTED AREA OF FEET BID 04/13/15  Yes Historical Provider, MD  Loratadine (CLARITIN PO) Take 1 tablet by mouth daily.    Yes Historical Provider, MD  UNABLE TO FIND C PAP for sleep apnea    Yes Historical Provider, MD  venlafaxine (EFFEXOR) 75 MG tablet Take 1 tablet (75 mg total) by mouth daily. 09/23/14  Yes Elby Beck,  FNP   BP 150/79 mmHg  Pulse 84  Temp(Src) 97.8 F (36.6 C) (Oral)  Resp 16  SpO2 97% Physical Exam  Constitutional: She is oriented to person, place, and time. She appears well-developed and well-nourished.  HENT:  Head: Atraumatic.  Eyes: Conjunctivae are normal. No scleral icterus.  Neck: No tracheal deviation present.  Cardiovascular: Intact distal pulses.   Pulmonary/Chest: Effort normal. No respiratory distress.  Musculoskeletal:  FROM of left thumb including extension, flexion, abduction, adduction, and opposition.  Strength intact.   Neurological: She is alert and oriented to person, place, and time.  Sensation intact in all digits of left upper extremity.   Skin: Skin is warm and dry.  1 cm superficial laceration on the left thumb through the dermis and subcutaneous fat.   Bleeding controlled with pressure.  No erythema, warmth, or drainage.  No visualization of foreign bodies.  Psychiatric: She has a normal mood and affect. Her behavior is normal.    ED Course  ProceduresLACERATION REPAIR Performed by: Gloriann Loan Consent: Verbal consent obtained. Risks and benefits: risks, benefits and alternatives were discussed Patient identity confirmed: provided demographic data Time out performed prior to procedure Prepped and Draped in normal sterile fashion Wound explored  Laceration Location: left thumb Laceration Length: 1 cm No Foreign Bodies seen or palpated Anesthesia: local infiltration Local anesthetic: lidocaine 1% without epinephrine Anesthetic total: 2 ml Irrigation method: syringe Amount of cleaning: standard Skin closure: 5-0 prolene Number of sutures or staples: 2 Technique: simple interrupted  Patient tolerance: Patient tolerated the procedure well with no immediate complications.    (including critical care time) Labs Review Labs Reviewed - No data to display  Imaging Review Dg Hand Complete Left  05/27/2015  CLINICAL DATA:  Cutter left thumb with the night this morning. Laceration. EXAM: LEFT HAND - COMPLETE 3+ VIEW COMPARISON:  None. FINDINGS: There is no evidence of fracture or dislocation. Mild osteoarthritis of the first [place] joint. Mild osteoarthritis of the second, third and fourth DIP joints. Soft tissues are unremarkable. IMPRESSION: No acute osseous abnormality of the left hand. Electronically Signed   By: Kathreen Devoid   On: 05/27/2015 08:48   I have personally reviewed and evaluated these images and lab results as part of my medical decision-making.   EKG Interpretation None      MDM   Final diagnoses:  Laceration   Patient presents with laceration to the left thumb.  Tetanus uptodate.  VSS, patient appears nontoxic, NAD.  On exam, 1 cm laceration through dermis and subcutaneous fat.  Left thumb abduction, adduction,  flexion, extension, and opposition intact.  Strength intact.  Sensation intact.  Intact distal pulses. Low suspicion for tendon involvement.   Imaging performed include plain films of left hand to evaluate for foreign bodies.  Labs not indicated.  Will perform wound cleaning and repair.  No foreign bodies, no fx or dislocation.   Pt stable for d/c.  Advised to follow up in 10 days for suture removal.  Discussed return precautions and supportive care.  Patient acknowledges and agrees with the above plan.     Gloriann Loan, PA-C 05/27/15 [id number]  Gareth Morgan, MD 05/27/15 [phone]

## 2015-05-27 NOTE — ED Notes (Signed)
PA at bedside.

## 2015-05-27 NOTE — ED Notes (Signed)
Pt reports she cut her left thumb with knife this morning around 0700. Laceration estimated 0.5 inch, bleeding controlled. Pain 7/10.

## 2015-07-13 NOTE — Telephone Encounter (Signed)
Left detailed message, ok per DPR, asking again if she wanted [name] to refer her for colonoscopy. Is it ok to remove from my inbox. Please advise.//kn

## 2015-07-14 NOTE — Telephone Encounter (Signed)
Yes.  Ok to remove from inbox.

## 2015-07-20 ENCOUNTER — Other Ambulatory Visit: Payer: Self-pay | Admitting: Family Medicine

## 2015-09-23 ENCOUNTER — Other Ambulatory Visit: Payer: Self-pay | Admitting: Obstetrics & Gynecology

## 2015-09-23 DIAGNOSIS — I10 Essential (primary) hypertension: Secondary | ICD-10-CM

## 2015-09-23 MED ORDER — VENLAFAXINE HCL 75 MG PO TABS: 75.0000 mg | ORAL_TABLET | Freq: Every day | ORAL | Status: DC

## 2015-09-23 NOTE — Telephone Encounter (Signed)
Patient requesting refill of Venlafaxine 75 mg to be sent to [facility] on [address]. Patient has been off of this for 3 days and is feeling jittery. Best # to reach: [phone]

## 2015-09-23 NOTE — Telephone Encounter (Signed)
Medication refill request: Effexor Last AEX:  04-27-15 Next AEX: 04-26-17 Last MMG (if hormonal medication request): 06-02-15 WNL Refill authorized: please advise

## 2016-01-17 ENCOUNTER — Ambulatory Visit (INDEPENDENT_AMBULATORY_CARE_PROVIDER_SITE_OTHER): Payer: Federal, State, Local not specified - PPO | Admitting: Obstetrics & Gynecology

## 2016-01-17 ENCOUNTER — Encounter: Payer: Self-pay | Admitting: Obstetrics & Gynecology

## 2016-01-17 VITALS — BP 130/86 | HR 100 | Temp 98.0°F | Resp 18 | Ht 65.5 in | Wt 285.0 lb

## 2016-01-17 DIAGNOSIS — M25511 Pain in right shoulder: Secondary | ICD-10-CM

## 2016-01-17 DIAGNOSIS — M79621 Pain in right upper arm: Secondary | ICD-10-CM

## 2016-01-17 NOTE — Progress Notes (Deleted)
   Subjective:   60 y.o. Married Caucasian G0P0 female presents for evaluation of {Right/left:16020} breast mass. Onset of the symptoms was{TIME UNITS:19995}. Patient sought evaluation because of {Symptoms; ROS skin/breast:30521}.  Contributing factors include {Causes; risk factors breast ca:12807}. Denies {Constitutional Sx:60209}. Patient denies hiistory of trauma, bites, or injuries. Last mammogram was {Time; 1 month to 1 year:14528}.  Previous evaluation has included{Procedures; breast eval:31381}   Review of Systems {Ros - Complete:30496}@SUBJECTIVE    Objective:   @General  appearance: {general exam:16600} Head: {head exam:30909::"Normocephalic, without obvious abnormality","atraumatic"} Neck: {neck exam:17463::"no adenopathy","no carotid bruit","no JVD","supple, symmetrical, trachea midline","thyroid not enlarged, symmetric, no tenderness/mass/nodules"} Back: {back exam:801::"symmetric, no curvature. ROM normal. No CVA tenderness."} Lungs: {lung exam:16931} Breasts: {breast exam:13139::"normal appearance, no masses or tenderness"} Heart: {heart exam:5510} Abdomen: {abdominal exam:16834}    Assessment:   @ASSESSMENT :Patient is diagnosed with {DIAGNOSES; OE:984588   Plan:   @PLAN : The patient {has/does not have:19846} a documented plan to follow with further care of {Diagnostic/therapeutic plan md:30626} on {DATE MONTH DAY PN:8107761 2. PLAN: FOLLOW {plan; follow-up ent:15048}

## 2016-01-17 NOTE — Progress Notes (Signed)
GYNECOLOGY  VISIT   HPI: 60 y.o. G0P0 Married Caucasian female with complaint of right axillary and right breast soreness.  Pt feels this started after going camping over [place] Day weekend.  She feels like there is swelling under her axilla on the right as well.  Pt having soreness at night when she sleeps.  She cannot reach her bra strap with her right arm and has pain when she reached out to open a door, for example.  Pt does have some anxiety about breast cancer due to her mother's diagnosis of breast cancer in her 30's.  Pt did have a 3D MMG on 06/02/15 and this was negative.    GYNECOLOGIC HISTORY: No LMP recorded. Patient has had a hysterectomy. Contraception: hysterectomy Menopausal hormone therapy: none  Patient Active Problem List   Diagnosis Date Noted  . Sleep apnea   . Urinary incontinence   . HTN (hypertension) 01/05/2012  . Osteopenia   . Chest pain, central 01/06/2011  . SOB (shortness of breath)   . Obesity     Past Medical History  Diagnosis Date  . Joint pain   . SOB (shortness of breath)   . Fatigue   . Chest pain   . Obesity   . Palpitation   . Depression   . GERD (gastroesophageal reflux disease)   . Hypertension   . Allergy   . Arthritis   . Osteopenia   . Sleep apnea   . Urinary incontinence     Past Surgical History  Procedure Laterality Date  . Rhinoplasty    . Knee arthroscopy      left knee  . Abdominal hysterectomy  08/10/2000    TAH/RSO  . Breast surgery      Biopsy-Benign  . Laparoscopic cholecystectomy  2010  . Laparotomy  1990    Fibroid removed    MEDS:  Reviewed in EPIC and UTD  ALLERGIES: Ace inhibitors; Lisinopril; Z-pak; and Neosporin  Family History  Problem Relation Age of Onset  . Breast cancer Mother     Age 9  . Hypertension Mother   . Hypertension Father   . Arthritis Father   . Heart disease Father   . Cancer Father     Spleen  . Heart disease Maternal Grandfather   . Colon cancer Paternal  Grandfather     Colon cancer    SH:  Married, non smoker  Review of Systems  All other systems reviewed and are negative.   PHYSICAL EXAMINATION:    BP 130/86 mmHg  Pulse 100  Temp(Src) 98 F (36.7 C) (Oral)  Resp 18  Ht 5' 5.5" (1.664 m)  Wt 285 lb (129.275 kg)  BMI 46.69 kg/m2     Physical Exam  Constitutional: She is oriented to person, place, and time. She appears well-developed and well-nourished.  Neck: Normal range of motion. No tracheal deviation present. No thyromegaly present.  Cardiovascular: Normal rate and regular rhythm.   Pulmonary/Chest: Effort normal and breath sounds normal. Right breast exhibits no inverted nipple, no mass, no nipple discharge, no skin change and no tenderness. Left breast exhibits no inverted nipple, no mass, no nipple discharge, no skin change and no tenderness. Breasts are symmetrical.    Lymphadenopathy:    She has no cervical adenopathy.    She has no axillary adenopathy.  Neurological: She is alert and oriented to person, place, and time.  Skin: Skin is warm and dry.  Psychiatric: She has a normal mood and affect.  Chaperone was present for exam.  Assessment: Axillary tenderness on right with some limitations in movement of right arm/shoulder Family hx of breast cancer in her mother in early 38's  Plan: Although I think this is orthopedic in nature, diagnostic right MMG will be planned.  Pt is also referred to Dr. Lynann Bologna for evaluation.

## 2016-01-17 NOTE — Progress Notes (Signed)
After appointment with Dr Sabra Heck, right breast diagnostic mammogram and ultrasound if needed scheduled for tomorrow 01-18-16 at [place] at [facility]. Appointment with Dr Lynann Bologna scheduled for 01-20-16 at 2 15 pm. Patient agreeable to date and times for both appointments. Direction to locations given.

## 2016-02-02 ENCOUNTER — Encounter: Payer: Self-pay | Admitting: Obstetrics & Gynecology

## 2016-06-07 ENCOUNTER — Ambulatory Visit (INDEPENDENT_AMBULATORY_CARE_PROVIDER_SITE_OTHER): Payer: Federal, State, Local not specified - PPO | Admitting: Family Medicine

## 2016-06-07 VITALS — BP 132/76 | HR 86

## 2016-06-07 DIAGNOSIS — R079 Chest pain, unspecified: Secondary | ICD-10-CM | POA: Diagnosis not present

## 2016-06-07 LAB — TROPONIN I

## 2016-06-07 MED ORDER — NITROGLYCERIN 0.4 MG SL SUBL
0.4000 mg | SUBLINGUAL_TABLET | SUBLINGUAL | 0 refills | Status: DC | PRN
Start: 2016-06-07 — End: 2019-09-24

## 2016-06-07 MED ORDER — NITROGLYCERIN 0.3 MG SL SUBL
0.4000 mg | SUBLINGUAL_TABLET | Freq: Once | SUBLINGUAL | Status: AC
  Administered 2016-06-07: 0.3 mg via SUBLINGUAL

## 2016-06-07 MED ORDER — OMEPRAZOLE 20 MG PO CPDR: 20.0000 mg | DELAYED_RELEASE_CAPSULE | Freq: Two times a day (BID) | ORAL | 3 refills | Status: DC

## 2016-06-07 MED ORDER — ASPIRIN 81 MG PO CHEW
324.0000 mg | CHEWABLE_TABLET | Freq: Once | ORAL | Status: AC
  Administered 2016-06-07: 324 mg via ORAL

## 2016-06-07 NOTE — Progress Notes (Signed)
Pain has resolved post x1 Nitro

## 2016-06-07 NOTE — Patient Instructions (Signed)
Chest Pain Observation It is often hard to give a specific diagnosis for the cause of chest pain. Among other possibilities your symptoms might be caused by inadequate oxygen delivery to your heart (angina). Angina that is not treated or evaluated can lead to a heart attack (myocardial infarction) or death. Blood tests, electrocardiograms, and X-rays may have been done to help determine a possible cause of your chest pain. After evaluation and observation, your health care provider has determined that it is unlikely your pain was caused by an unstable condition that requires hospitalization. However, a full evaluation of your pain may need to be completed, with additional diagnostic testing as directed. It is very important to keep your follow-up appointments. Not keeping your follow-up appointments could result in permanent heart damage, disability, or death. If there is any problem keeping your follow-up appointments, you must call your health care provider. HOME CARE INSTRUCTIONS  Due to the slight chance that your pain could be angina, it is important to follow your health care provider's treatment plan and also maintain a healthy lifestyle:  Maintain or work toward achieving a healthy weight.  Stay physically active and exercise regularly.  Decrease your salt intake.  Eat a balanced, healthy diet. Talk to a dietitian to learn about heart-healthy foods.  Increase your fiber intake by including whole grains, vegetables, fruits, and nuts in your diet.  Avoid situations that cause stress, anger, or depression.  Take medicines as advised by your health care provider. Report any side effects to your health care provider. Do not stop medicines or adjust the dosages on your own.  Quit smoking. Do not use nicotine patches or gum until you check with your health care provider.  Keep your blood pressure, blood sugar, and cholesterol levels within normal limits.  Limit alcohol intake to no more than  1 drink per day for women who are not pregnant and 2 drinks per day for men.  Do not abuse drugs. SEEK IMMEDIATE MEDICAL CARE IF: You have severe chest pain or pressure which may include symptoms such as:  You feel pain or pressure in your arms, neck, jaw, or back.  You have severe back or abdominal pain, feel sick to your stomach (nauseous), or throw up (vomit).  You are sweating profusely.  You are having a fast or irregular heartbeat.  You feel short of breath while at rest.  You notice increasing shortness of breath during rest, sleep, or with activity.  You have chest pain that does not get better after rest or after taking your usual medicine.  You wake from sleep with chest pain.  You are unable to sleep because you cannot breathe.  You develop a frequent cough or you are coughing up blood.  You feel dizzy, faint, or experience extreme fatigue.  You develop severe weakness, dizziness, fainting, or chills. Any of these symptoms may represent a serious problem that is an emergency. Do not wait to see if the symptoms will go away. Call your local emergency services (911 in the [place] Do not drive yourself to the hospital. MAKE SURE YOU:  Understand these instructions.  Will watch your condition.  Will get help right away if you are not doing well or get worse.   This information is not intended to replace advice given to you by your health care provider. Make sure you discuss any questions you have with your health care provider.   Document Released: 09/02/2010 Document Revised: 08/05/2013 Document Reviewed: 01/30/2013 Elsevier Interactive Patient  Education 2016 [place].  Angina Pectoris Angina pectoris, often called angina, is extreme discomfort in the chest, neck, or arm. This is caused by a lack of blood in the middle and thickest layer of the heart wall (myocardium). There are four types of angina:  Stable angina. Stable angina usually occurs in episodes of  predictable frequency and duration. It is usually brought on by physical activity, stress, or excitement. Stable angina usually lasts a few minutes and can often be relieved by a medicine that you place under your tongue. This medicine is called sublingual nitroglycerin.  Unstable angina. Unstable angina can occur even when you are doing little or no physical activity. It can even occur while you are sleeping or when you are at rest. It can suddenly increase in severity or frequency. It may not be relieved by sublingual nitroglycerin, and it can last up to 30 minutes.  Microvascular angina. This type of angina is caused by a disorder of tiny blood vessels called arterioles. Microvascular angina is more common in women. The pain may be more severe and last longer than other types of angina pectoris.  Prinzmetal or variant angina. This type of angina pectoris is rare and usually occurs when you are doing little or no physical activity. It especially occurs in the early morning hours. CAUSES Atherosclerosis is the cause of angina. This is the buildup of fat and cholesterol (plaque) on the inside of the arteries. Over time, the plaque may narrow or block the artery, and this will lessen blood flow to the heart. Plaque can also become weak and break off within a coronary artery to form a clot and cause a sudden blockage. RISK FACTORS Risk factors common to both men and women include:  High cholesterol levels.  High blood pressure (hypertension).  Tobacco use.  Diabetes.  Family history of angina.  Obesity.  Lack of exercise.  A diet high in saturated fats. Women are at greater risk for angina if they are:  Over age 69.  Postmenopausal. SYMPTOMS Many people do not experience any symptoms during the early stages of angina. As the condition progresses, symptoms common to both men and women may include:  Chest pain.  The pain can be described as a crushing or squeezing in the chest, or a  tightness, pressure, fullness, or heaviness in the chest.  The pain can last more than a few minutes, or it can stop and recur.  Pain in the arms, neck, jaw, or back.  Unexplained heartburn or indigestion.  Shortness of breath.  Nausea.  Sudden cold sweats.  Sudden light-headedness. Many women have chest discomfort and some of the other symptoms. However, women often have different (atypical) symptoms, such as:   Fatigue.  Unexplained feelings of nervousness or anxiety.  Unexplained weakness.  Dizziness or fainting. Sometimes, women may have angina without any symptoms. DIAGNOSIS  Tests to diagnose angina may include:  ECG (electrocardiogram).  Exercise stress test. This looks for signs of blockage when the heart is being exercised.  Pharmacologic stress test. This test looks for signs of blockage when the heart is being stressed with a medicine.  Blood tests.  Coronary angiogram. This is a procedure to look at the coronary arteries to see if there is any blockage. TREATMENT  The treatment of angina may include the following:  Healthy behavioral changes to reduce or control risk factors.  Medicine.  Coronary stenting.A stent helps to keep an artery open.  Coronary angioplasty. This procedure widens a narrowed  or blocked artery.  Coronary arterybypass surgery. This will allow your blood to pass the blockage (bypass) to reach your heart. HOME CARE INSTRUCTIONS   Take medicines only as directed by your health care provider.  Do not take the following medicines unless your health care provider approves:  Nonsteroidal anti-inflammatory drugs (NSAIDs), such as ibuprofen, naproxen, or celecoxib.  Vitamin supplements that contain vitamin A, vitamin E, or both.  Hormone replacement therapy that contains estrogen with or without progestin.  Manage other health conditions such as hypertension and diabetes as directed by your health care provider.  Follow a  heart-healthy diet. A dietitian can help to educate you about healthy food options and changes.  Use healthy cooking methods such as roasting, grilling, broiling, baking, poaching, steaming, or stir-frying. Talk to a dietitian to learn more about healthy cooking methods.  Follow an exercise program approved by your health care provider.  Maintain a healthy weight. Lose weight as approved by your health care provider.  Plan rest periods when fatigued.  Learn to manage stress.  Do not use any tobacco products, including cigarettes, chewing tobacco, or electronic cigarettes. If you need help quitting, ask your health care provider.  If you drink alcohol, and your health care provider approves, limit your alcohol intake to no more than 1 drink per day. One drink equals 12 ounces of beer, 5 ounces of wine, or 1 ounces of hard liquor.  Stop illegal drug use.  Keep all follow-up visits as directed by your health care provider. This is important. SEEK IMMEDIATE MEDICAL CARE IF:   You have pain in your chest, neck, arm, jaw, stomach, or back that lasts more than a few minutes, is recurring, or is unrelieved by taking sublingualnitroglycerin.  You have profuse sweating without cause.  You have unexplained:  Heartburn or indigestion.  Shortness of breath or difficulty breathing.  Nausea or vomiting.  Fatigue.  Feelings of nervousness or anxiety.  Weakness.  Diarrhea.  You have sudden light-headedness or dizziness.  You faint. These symptoms may represent a serious problem that is an emergency. Do not wait to see if the symptoms will go away. Get medical help right away. Call your local emergency services (911 in the [place] Do not drive yourself to the hospital.   This information is not intended to replace advice given to you by your health care provider. Make sure you discuss any questions you have with your health care provider.   Document Released: 07/31/2005 Document  Revised: 08/21/2014 Document Reviewed: 12/02/2013 Elsevier Interactive Patient Education [employer].

## 2016-06-07 NOTE — Progress Notes (Addendum)
By signing my name below, I, Cheryl [name], attest that this documentation has been prepared under the direction and in the presence of Cheryl Cheadle, MD. Pt takes baby ASA daily. Electronically Signed: Verlee [name], Medical Scribe. 06/07/16. 12:14 PM.  Subjective:    Patient ID: Cheryl [name], female    DOB: 05-24-1956, 60 y.o.   MRN: [id number]  HPI Chief Complaint  Patient presents with  . Chest Pain  . Hypertension    HPI Comments: Cheryl [name] is a 60 y.o. female who presents to the [facility] complaining of sudden sharp chest pain onset 1.5 hours ago. Pt was sitting at her desk when she felt chest pain that took her breath away and eventually experienced pain in her jaw pain near her teeth. Pt check her bp and noticed her bp was unusually high running at 190/110.  Pt states her chest pain has slowly subsided since onset but it's not competely gone. Pt currrently has dull HA, and her vision has been blurry for the past week but isn't sure if her vision changes are [name] to dirty contact lenses. Pt hasn't taken anythign for her symptoms.   Pt denies PMHx of CAD/vascular disease. Pt has a h/o both GERD and HTN but both are controlled w/o medication. + OSA.  Pt denies nausea, diaphoresis, stomach pain, back pain, and SOB.  Patient Active Problem List   Diagnosis Date Noted  . Sleep apnea   . Urinary incontinence   . HTN (hypertension) 01/05/2012  . Osteopenia   . Chest pain, central 01/06/2011  . SOB (shortness of breath)   . Obesity    Past Medical History:  Diagnosis Date  . Allergy   . Arthritis   . Chest pain   . Depression   . Fatigue   . GERD (gastroesophageal reflux disease)   . Hypertension   . Joint pain   . Obesity   . Osteopenia   . Palpitation   . Sleep apnea   . SOB (shortness of breath)   . Urinary incontinence    Past Surgical History:  Procedure Laterality Date  . ABDOMINAL HYSTERECTOMY  08/10/2000   TAH/RSO  . BREAST  SURGERY     Biopsy-Benign  . KNEE ARTHROSCOPY     left knee  . LAPAROSCOPIC CHOLECYSTECTOMY  2010  . LAPAROTOMY  1990   Fibroid removed  . RHINOPLASTY     Allergies  Allergen Reactions  . Ace Inhibitors Cough  . Lisinopril Cough  . Z-Pak [Azithromycin]     thrush  . Neosporin [Neomycin-Bacitracin Zn-Polymyx] Rash   Prior to Admission medications   Medication Sig Start Date End Date Taking? Authorizing Provider  Cholecalciferol (VITAMIN D3) 1000 UNITS CAPS Take 1 capsule by mouth daily.     Historical Provider, MD  ketoconazole (NIZORAL) 2 % cream APPLY 1 APPLICATION TO AFFECTED AREA OF FEET BID 04/13/15   Historical Provider, MD  Loratadine (CLARITIN PO) Take 1 tablet by mouth daily.     Historical Provider, MD  UNABLE TO FIND C PAP for sleep apnea     Historical Provider, MD  venlafaxine (EFFEXOR) 75 MG tablet Take 1 tablet (75 mg total) by mouth daily. 09/23/15   Megan Salon, MD   Social History   Social History  . Marital status: Married    Spouse name: Cheryl [name]  . Number of children: 2  . Years of education: 16   Occupational History  . Substitute Teacher  Social History Main Topics  . Smoking status: Former Smoker    Quit date: 08/15/2007  . Smokeless tobacco: Never Used  . Alcohol use 1.8 - 2.4 oz/week    3 - 4 Standard drinks or equivalent per week     Comment: MODERATELY  . Drug use: No  . Sexual activity: No     Comment: hysterectomy   Other Topics Concern  . Not on file   Social History Narrative   2 adopted children, Eric (2) and Beth (18)   Review of Systems  Constitutional: Negative for diaphoresis.  Eyes: Positive for visual disturbance.  Respiratory: Negative for shortness of breath.   Cardiovascular: Positive for chest pain.  Gastrointestinal: Negative for abdominal pain and nausea.  Musculoskeletal: Negative for back pain.  Neurological: Positive for headaches.    Objective:  Physical Exam  Constitutional: She appears  well-developed and well-nourished. No distress.  HENT:  Head: Normocephalic and atraumatic.  Eyes: Conjunctivae are normal.  Neck: Neck supple. Carotid bruit is not present.  Cardiovascular: Normal rate, regular rhythm, S1 normal, S2 normal, normal heart sounds and intact distal pulses.  Exam reveals no gallop and no friction rub.   No murmur heard. Pulmonary/Chest: Effort normal and breath sounds normal. No respiratory distress. She has no wheezes. She has no rales.  Neurological: She is alert.  Skin: Skin is warm and dry.  Psychiatric: She has a normal mood and affect. Her behavior is normal.  Nursing note and vitals reviewed. BP (!) 147/88 (BP Location: Left Arm, Patient Position: Sitting, Cuff Size: Large)   Pulse 86    Pt immed placed on 2L O2 by [place]. Chewed 4 asa 81mg  (=324mg ) and given nitro 0.4mg  SL po x1   [EKG Reading]: Nl sinus rhythm. No acute ischemic changes. EKG compared to 01/06/2011; she's developed new Q-wave lead 3 as well as loss of S-wave no ischemic abnormalities or strain.  [12:50 PM] Pt feels a lot better and states the nitroglycerin has helped, her pain is now resolved. BP on left arm while sitting up: 132/76  Assessment & Plan:   1. Chest pain, unspecified type   Pt had a chemical stress test in 2012 at [facility] cards with Dr. Verl Blalock.  I assume this was negative per pt report though I do not see the results in the chart. Pt sxs are atypical for angina but did rapidly resolve after oxygen, asa, and nitro. No EKG changes, vitals normal. Did draw stat trop although as pt sxs only started sev hrs prior to eval it is outside of the expected window to catch any recent myocardial damage.  Advised pt that it would be safest to have her transported to the ED for o/n chest pain r/o but as she is currently asymptomatic and no abnml found, she chooses to forgo this which I think is fine. Start asa 324mg  qd and nexium 20mg  bid. Stat cardiology referral. If sxs recur, stop  activity, take nitro 0.4 sl, and call 911. Ok to repeat nitro q5 min x 3. Pt and husband both understand and are agreeable to plan  Orders Placed This Encounter  Procedures  . Troponin I  . Ambulatory referral to Cardiology    Referral Priority:   Urgent    Referral Type:   Consultation    Referral Reason:   Specialty Services Required    Requested Specialty:   Cardiology    Number of Visits Requested:   1  . EKG 12-Lead    Meds  ordered this encounter  Medications  . aspirin 81 MG chewable tablet    Sig: Chew 81 mg by mouth daily.  Marland Kitchen aspirin chewable tablet 324 mg  . nitroGLYCERIN (NITROSTAT) SL tablet 0.3 mg  . nitroGLYCERIN (NITROSTAT) 0.4 MG SL tablet    Sig: Place 1 tablet (0.4 mg total) under the tongue every 5 (five) minutes as needed for chest pain.    Dispense:  12 tablet    Refill:  0  . omeprazole (PRILOSEC) 20 MG capsule    Sig: Take 1 capsule (20 mg total) by mouth 2 (two) times daily before a meal.    Dispense:  30 capsule    Refill:  3   Over 40 min spent in face-to-face evaluation of and consultation with patient and coordination of care.  Over 50% of this time was spent counseling this patient.  I personally performed the services described in this documentation, which was scribed in my presence. The recorded information has been reviewed and considered, and addended by me as needed.   Cheryl [name], M.D.  [facility] [address] [place], [place] [address] [phone] phone [phone] fax  06/07/16 1:49 PM

## 2016-06-08 ENCOUNTER — Ambulatory Visit (INDEPENDENT_AMBULATORY_CARE_PROVIDER_SITE_OTHER): Payer: Federal, State, Local not specified - PPO | Admitting: Obstetrics & Gynecology

## 2016-06-08 ENCOUNTER — Encounter: Payer: Self-pay | Admitting: Obstetrics & Gynecology

## 2016-06-08 VITALS — BP 118/82 | HR 88 | Resp 16 | Ht 65.0 in | Wt 278.0 lb

## 2016-06-08 DIAGNOSIS — E2839 Other primary ovarian failure: Secondary | ICD-10-CM | POA: Diagnosis not present

## 2016-06-08 DIAGNOSIS — Z01419 Encounter for gynecological examination (general) (routine) without abnormal findings: Secondary | ICD-10-CM | POA: Diagnosis not present

## 2016-06-08 DIAGNOSIS — Z0000 Encounter for general adult medical examination without abnormal findings: Secondary | ICD-10-CM

## 2016-06-08 LAB — COMPREHENSIVE METABOLIC PANEL
ALK PHOS: 100 U/L (ref 33–130)
ALT: 13 U/L (ref 6–29)
AST: 16 U/L (ref 10–35)
Albumin: 4 g/dL (ref 3.6–5.1)
BILIRUBIN TOTAL: 0.6 mg/dL (ref 0.2–1.2)
BUN: 19 mg/dL (ref 7–25)
CO2: 26 mmol/L (ref 20–31)
Calcium: 9.5 mg/dL (ref 8.6–10.4)
Chloride: 105 mmol/L (ref 98–110)
Creat: 0.81 mg/dL (ref 0.50–0.99)
GLUCOSE: 95 mg/dL (ref 65–99)
Potassium: 4.6 mmol/L (ref 3.5–5.3)
Sodium: 140 mmol/L (ref 135–146)
Total Protein: 6.9 g/dL (ref 6.1–8.1)

## 2016-06-08 LAB — LIPID PANEL
CHOL/HDL RATIO: 3.4 ratio (ref <=5.0)
Cholesterol: 212 mg/dL — ABNORMAL HIGH (ref 125–200)
HDL: 63 mg/dL (ref >=46)
LDL CALC: 130 mg/dL — AB (ref <130)
Triglycerides: 96 mg/dL (ref <150)
VLDL: 19 mg/dL (ref <30)

## 2016-06-08 LAB — CBC WITH DIFFERENTIAL/PLATELET
BASOS PCT: 1 %
Basophils Absolute: 50 cells/uL (ref 0–200)
EOS PCT: 2 %
Eosinophils Absolute: 100 cells/uL (ref 15–500)
HCT: 43.6 % (ref 35.0–45.0)
Hemoglobin: 13.6 g/dL (ref 11.7–15.5)
LYMPHS PCT: 28 %
Lymphs Abs: 1400 cells/uL (ref 850–3900)
MCH: 27.5 pg (ref 27.0–33.0)
MCHC: 31.2 g/dL — AB (ref 32.0–36.0)
MCV: 88.3 fL (ref 80.0–100.0)
MPV: 11.6 fL (ref 7.5–12.5)
Monocytes Absolute: 450 cells/uL (ref 200–950)
Monocytes Relative: 9 %
NEUTROS PCT: 60 %
Neutro Abs: 3000 cells/uL (ref 1500–7800)
PLATELETS: 263 10*3/uL (ref 140–400)
RBC: 4.94 MIL/uL (ref 3.80–5.10)
RDW: 14.4 % (ref 11.0–15.0)
WBC: 5 10*3/uL (ref 3.8–10.8)

## 2016-06-08 LAB — POCT URINALYSIS DIPSTICK
Bilirubin, UA: NEGATIVE
GLUCOSE UA: NEGATIVE
Ketones, UA: NEGATIVE
Leukocytes, UA: NEGATIVE
NITRITE UA: NEGATIVE
PH UA: 5
PROTEIN UA: NEGATIVE
RBC UA: NEGATIVE
UROBILINOGEN UA: NEGATIVE

## 2016-06-08 LAB — TSH: TSH: 2.81 m[IU]/L

## 2016-06-08 NOTE — Progress Notes (Signed)
60 y.o. G0P0 MarriedCaucasianF here for annual exam.  Doing well.  Has new grand baby this year.  Denies vaginal bleeding.  Does want blood work done today.  Does give blood regularly and does not need Hep C testing.    No LMP recorded. Patient has had a hysterectomy.          Sexually active: No.  The current method of family planning is status post hysterectomy.    Exercising: No.  The patient does not participate in regular exercise at present. Smoker:  no  Health Maintenance: Pap:  01/12/11 negative, HR HPV negative  History of abnormal Pap:  no MMG:  01/18/16 BIRADS 3 probably benign- repeat 6 months Colonoscopy:  About 8 years ago per patient; normal per patient, repeat in 10 years BMD:   01/10/12 normal  TDaP:  08/14/10 Pneumonia vaccine(s):  PCP Hep C testing: Gives blood regularly Screening Labs: yes, Hb today: CBC done today, Urine today: Negative   reports that she quit smoking about 8 years ago. She has never used smokeless tobacco. She reports that she drinks about 1.8 - 2.4 oz of alcohol per week . She reports that she does not use drugs.  Past Medical History:  Diagnosis Date  . Allergy   . Arthritis   . Chest pain   . Depression   . Fatigue   . GERD (gastroesophageal reflux disease)   . Hypertension   . Joint pain   . Obesity   . Osteopenia   . Palpitation   . Sleep apnea   . SOB (shortness of breath)   . Urinary incontinence     Past Surgical History:  Procedure Laterality Date  . ABDOMINAL HYSTERECTOMY  08/10/2000   TAH/RSO  . BREAST SURGERY     Biopsy-Benign  . KNEE ARTHROSCOPY     left knee  . LAPAROSCOPIC CHOLECYSTECTOMY  2010  . LAPAROTOMY  1990   Fibroid removed  . RHINOPLASTY      Current Outpatient Prescriptions  Medication Sig Dispense Refill  . aspirin 81 MG chewable tablet Chew 81 mg by mouth daily.    . Cholecalciferol (VITAMIN D3) 1000 UNITS CAPS Take 1 capsule by mouth daily.     Marland Kitchen ketoconazole (NIZORAL) 2 % cream APPLY 1 APPLICATION  TO AFFECTED AREA OF FEET BID  0  . Loratadine (CLARITIN PO) Take 1 tablet by mouth daily.     . nitroGLYCERIN (NITROSTAT) 0.4 MG SL tablet Place 1 tablet (0.4 mg total) under the tongue every 5 (five) minutes as needed for chest pain. 12 tablet 0  . omeprazole (PRILOSEC) 20 MG capsule Take 1 capsule (20 mg total) by mouth 2 (two) times daily before a meal. 30 capsule 3  . UNABLE TO FIND C PAP for sleep apnea     . venlafaxine (EFFEXOR) 75 MG tablet Take 1 tablet (75 mg total) by mouth daily. 90 tablet 2   No current facility-administered medications for this visit.     Family History  Problem Relation Age of Onset  . Breast cancer Mother     Age 2  . Hypertension Mother   . Hypertension Father   . Arthritis Father   . Heart disease Father   . Cancer Father     Spleen  . Heart disease Maternal Grandfather   . Colon cancer Paternal Grandfather     Colon cancer    ROS:  Pertinent items are noted in HPI.  Otherwise, a comprehensive ROS was negative.  Exam:  BP 118/82 (BP Location: Right Arm, Patient Position: Sitting, Cuff Size: Normal)   Pulse 88   Resp 16   Ht 5\' 5"  (1.651 m)   Wt 278 lb (126.1 kg)   BMI 46.26 kg/m   Weight change: -4#   Height: 5\' 5"  (165.1 cm)  Ht Readings from Last 3 Encounters:  06/08/16 5\' 5"  (1.651 m)  01/17/16 5' 5.5" (1.664 m)  04/27/15 5' 5.5" (1.664 m)   General appearance: alert, cooperative and appears stated age Head: Normocephalic, without obvious abnormality, atraumatic Neck: no adenopathy, supple, symmetrical, trachea midline and thyroid normal to inspection and palpation Lungs: clear to auscultation bilaterally Breasts: normal appearance, no masses or tenderness Heart: regular rate and rhythm Abdomen: soft, non-tender; bowel sounds normal; no masses,  no organomegaly Extremities: extremities normal, atraumatic, no cyanosis or edema Skin: Skin color, texture, turgor normal. No rashes or lesions Lymph nodes: Cervical, supraclavicular,  and axillary nodes normal. No abnormal inguinal nodes palpated Neurologic: Grossly normal   Pelvic: External genitalia:  no lesions              Urethra:  normal appearing urethra with no masses, tenderness or lesions              Bartholins and Skenes: normal                 Vagina: normal appearing vagina with normal color and discharge, no lesions              Cervix: absent              Pap taken: No. Bimanual Exam:  Uterus:  uterus absent              Adnexa: no mass, fullness, tenderness               Rectovaginal: Confirms               Anus:  normal sphincter tone, no lesions  Chaperone was present for exam.  A:    Well Woman with normal exam Labia majora hypertrophy H/O TAH/RSO 07/2000 Morbid obesity H/o hypertension  P: Mammogram yearly.  Pt has follow scheduled in December. BMD will be planned for next June with routine MMG. No pap smear. H/OTAH for fibroids/endometriosis CBC with diff, CMP, Lipid, TSH Return annually or prn

## 2016-06-15 NOTE — Progress Notes (Signed)
HPI: 60 year old female for evaluation of chest pain. Prior cardiac history. Nuclear study June 2012 showed ejection fraction 72% and normal perfusion. Laboratories October 2017 showed normal hemoglobin and troponin. On October 26 the patient had chest pain while sitting at her desk. It was described as a pressure radiating to her jaws. It lasted 3 minutes and resolved. Not pleuritic, positional or related to food. No associated symptoms. She otherwise has not had chest pain. No exertional symptoms. She has mild dyspnea on exertion but no orthopnea or PND. Occasional mild pedal edema. No syncope.   Current Outpatient Prescriptions  Medication Sig Dispense Refill  . aspirin 81 MG chewable tablet Chew 81 mg by mouth daily.    . Cholecalciferol (VITAMIN D3) 1000 UNITS CAPS Take 1 capsule by mouth daily.     Marland Kitchen ketoconazole (NIZORAL) 2 % cream APPLY 1 APPLICATION TO AFFECTED AREA OF FEET BID  0  . Loratadine (CLARITIN PO) Take 1 tablet by mouth daily.     . nitroGLYCERIN (NITROSTAT) 0.4 MG SL tablet Place 1 tablet (0.4 mg total) under the tongue every 5 (five) minutes as needed for chest pain. 12 tablet 0  . UNABLE TO FIND C PAP for sleep apnea     . venlafaxine (EFFEXOR) 75 MG tablet Take 1 tablet (75 mg total) by mouth daily. 90 tablet 2   No current facility-administered medications for this visit.     Allergies  Allergen Reactions  . Ace Inhibitors Cough  . Lisinopril Cough  . Z-Pak [Azithromycin]     thrush  . Neosporin [Neomycin-Bacitracin Zn-Polymyx] Rash     Past Medical History:  Diagnosis Date  . Allergy   . Arthritis   . Chest pain   . Depression   . Fatigue   . GERD (gastroesophageal reflux disease)   . Hyperlipidemia   . Hypertension   . Joint pain   . Obesity   . Osteopenia   . Palpitation   . Sleep apnea   . SOB (shortness of breath)   . Urinary incontinence     Past Surgical History:  Procedure Laterality Date  . ABDOMINAL HYSTERECTOMY  08/10/2000   TAH/RSO  . BREAST SURGERY     Biopsy-Benign  . KNEE ARTHROSCOPY     left knee  . LAPAROSCOPIC CHOLECYSTECTOMY  2010  . LAPAROTOMY  1990   Fibroid removed  . RHINOPLASTY      Social History   Social History  . Marital status: Married    Spouse name: Emia Migues  . Number of children: 2  . Years of education: 16   Occupational History  . Substitute Teacher    Social History Main Topics  . Smoking status: Former Smoker    Quit date: 08/15/2007  . Smokeless tobacco: Never Used  . Alcohol use 1.8 - 2.4 oz/week    3 - 4 Standard drinks or equivalent per week     Comment: MODERATELY  . Drug use: No  . Sexual activity: No     Comment: hysterectomy   Other Topics Concern  . Not on file   Social History Narrative   2 adopted children, Eric (18) and Beth (18)    Family History  Problem Relation Age of Onset  . Breast cancer Mother     Age 37  . Hypertension Mother   . Hypertension Father   . Arthritis Father   . Heart disease Father   . Cancer Father     Spleen  .  Heart disease Maternal Grandfather   . Colon cancer Paternal Grandfather     Colon cancer    ROS: Knee arthralgias but no fevers or chills, productive cough, hemoptysis, dysphasia, odynophagia, melena, hematochezia, dysuria, hematuria, rash, seizure activity, orthopnea, PND, pedal edema, claudication. Remaining systems are negative.  Physical Exam:   Blood pressure (!) 142/86, pulse 89, height 5\' 6"  (1.676 m), weight 275 lb (124.7 kg).  General:  Well developed/obese in NAD Skin warm/dry Patient not depressed No peripheral clubbing Back-normal HEENT-normal/normal eyelids Neck supple/normal carotid upstroke bilaterally; no bruits; no JVD; no thyromegaly chest - CTA/ normal expansion CV - RRR/normal S1 and S2; no murmurs, rubs or gallops;  PMI nondisplaced Abdomen -NT/ND, no HSM, no mass, + bowel sounds, no bruit 2+ femoral pulses, no bruits Ext-no edema, chords, 2+ DP Neuro-grossly  nonfocal  ECG 06/07/16-NSR with no ST changes  A/P  1 chest pain-symptoms with both typical and atypical features. Plan exercise treadmill for risk stratification.  2 dyspnea-check echocardiogram for LV function.  3 obesity-we discussed importance of exercise and weight loss.  4 hyperlipidemia-management per primary care.  Kirk Ruths, MD

## 2016-06-19 ENCOUNTER — Encounter: Payer: Self-pay | Admitting: Cardiology

## 2016-06-20 ENCOUNTER — Ambulatory Visit (INDEPENDENT_AMBULATORY_CARE_PROVIDER_SITE_OTHER): Payer: Federal, State, Local not specified - PPO | Admitting: Cardiology

## 2016-06-20 ENCOUNTER — Encounter: Payer: Self-pay | Admitting: Cardiology

## 2016-06-20 VITALS — BP 142/86 | HR 89 | Ht 66.0 in | Wt 275.0 lb

## 2016-06-20 DIAGNOSIS — R0602 Shortness of breath: Secondary | ICD-10-CM

## 2016-06-20 DIAGNOSIS — R072 Precordial pain: Secondary | ICD-10-CM | POA: Diagnosis not present

## 2016-06-20 NOTE — Patient Instructions (Signed)
Medication Instructions:   NO CHANGE  Testing/Procedures:  Your physician has requested that you have an echocardiogram. Echocardiography is a painless test that uses sound waves to create images of your heart. It provides your doctor with information about the size and shape of your heart and how well your heart's chambers and valves are working. This procedure takes approximately one hour. There are no restrictions for this procedure.   Your physician has requested that you have an exercise tolerance test. For further information please visit [email or web address]. Please also follow instruction sheet, as given.    Follow-Up:  Your physician recommends that you schedule a follow-up appointment in: AS NEEDED PENDING TEST RESULTS    Exercise Stress Electrocardiogram An exercise stress electrocardiogram is a test to check how blood flows to your heart. It is done to find areas of poor blood flow. You will need to walk on a treadmill for this test. The electrocardiogram will record your heartbeat when you are at rest and when you are exercising. BEFORE THE PROCEDURE  Do not have drinks with caffeine or foods with caffeine for 24 hours before the test, or as told by your doctor. This includes coffee, tea (even decaf tea), sodas, chocolate, and cocoa.  Follow your doctor's instructions about eating and drinking before the test.  Ask your doctor what medicines you should or should not take before the test. Take your medicines with water unless told by your doctor not to.  If you use an inhaler, bring it with you to the test.  Bring a snack to eat after the test.  Do not  smoke for 4 hours before the test.  Do not put lotions, powders, creams, or oils on your chest before the test.  Wear comfortable shoes and clothing. PROCEDURE  You will have patches put on your chest. Small areas of your chest may need to be shaved. Wires will be connected to the patches.  Your heart rate will be  watched while you are resting and while you are exercising.  You will walk on the treadmill. The treadmill will slowly get faster to raise your heart rate.  The test will take about 1-2 hours. AFTER THE PROCEDURE  Your heart rate and blood pressure will be watched after the test.  You may return to your normal diet, activities, and medicines or as told by your doctor.   This information is not intended to replace advice given to you by your health care provider. Make sure you discuss any questions you have with your health care provider.   Document Released: 01/17/2008 Document Revised: 08/21/2014 Document Reviewed: 04/07/2013 Elsevier Interactive Patient Education 2016 Elsevier Inc.    

## 2016-06-22 ENCOUNTER — Telehealth (HOSPITAL_COMMUNITY): Payer: Self-pay

## 2016-06-22 NOTE — Telephone Encounter (Signed)
Encounter complete. 

## 2016-06-24 ENCOUNTER — Other Ambulatory Visit: Payer: Self-pay | Admitting: Obstetrics & Gynecology

## 2016-06-24 DIAGNOSIS — I10 Essential (primary) hypertension: Secondary | ICD-10-CM

## 2016-06-26 NOTE — Telephone Encounter (Signed)
eScribe request from [facility] for refill on VENLAFAXINE 75 MG Last filled - 09/23/15, #90 X 2 Last AEX - 06/08/16 with Dr. Sabra Heck Next AEX - 09/27/17 with Dr. Sabra Heck Last MMG - 01/18/16, Bi-Rads 3: Probably Benign, repeat in 6 months  Please advise refill.

## 2016-06-27 ENCOUNTER — Ambulatory Visit (HOSPITAL_COMMUNITY)
Admission: RE | Admit: 2016-06-27 | Discharge: 2016-06-27 | Disposition: A | Discharge status: Home / Self Care | Payer: Federal, State, Local not specified - PPO | Source: Ambulatory Visit | Attending: Cardiovascular Disease | Admitting: Cardiovascular Disease

## 2016-06-27 DIAGNOSIS — R072 Precordial pain: Secondary | ICD-10-CM | POA: Diagnosis not present

## 2016-06-27 LAB — EXERCISE TOLERANCE TEST
CSEPED: 6 min
CSEPEW: 7 METS
CSEPHR: 88 %
CSEPPHR: 142 {beats}/min
Exercise duration (sec): 0 s
MPHR: 160 {beats}/min
RPE: 18
Rest HR: 79 {beats}/min

## 2016-07-12 ENCOUNTER — Other Ambulatory Visit (HOSPITAL_COMMUNITY): Payer: Federal, State, Local not specified - PPO

## 2016-08-03 ENCOUNTER — Ambulatory Visit (HOSPITAL_COMMUNITY): Payer: Federal, State, Local not specified - PPO | Attending: Internal Medicine

## 2016-08-03 ENCOUNTER — Other Ambulatory Visit: Payer: Self-pay

## 2016-08-03 DIAGNOSIS — I071 Rheumatic tricuspid insufficiency: Secondary | ICD-10-CM | POA: Diagnosis not present

## 2016-08-03 DIAGNOSIS — R072 Precordial pain: Secondary | ICD-10-CM | POA: Diagnosis not present

## 2016-08-03 DIAGNOSIS — I10 Essential (primary) hypertension: Secondary | ICD-10-CM | POA: Insufficient documentation

## 2016-08-03 DIAGNOSIS — E785 Hyperlipidemia, unspecified: Secondary | ICD-10-CM | POA: Diagnosis not present

## 2016-08-04 ENCOUNTER — Other Ambulatory Visit (HOSPITAL_COMMUNITY): Payer: Federal, State, Local not specified - PPO

## 2016-08-15 ENCOUNTER — Telehealth: Payer: Self-pay | Admitting: *Deleted

## 2016-08-15 NOTE — Telephone Encounter (Signed)
Patient in 04 recall - please contact patient- she is due for DX MMG and U/S

## 2016-08-17 DIAGNOSIS — N644 Mastodynia: Secondary | ICD-10-CM | POA: Diagnosis not present

## 2016-08-17 NOTE — Telephone Encounter (Signed)
Call to patient. Patient states she has appointment this afternoon with [facility] for 6 month MMG and [name].

## 2016-08-24 ENCOUNTER — Ambulatory Visit: Payer: Federal, State, Local not specified - PPO | Admitting: Obstetrics & Gynecology

## 2016-08-24 ENCOUNTER — Encounter: Payer: Self-pay | Admitting: Obstetrics & Gynecology

## 2016-09-07 DIAGNOSIS — S83522A Sprain of posterior cruciate ligament of left knee, initial encounter: Secondary | ICD-10-CM | POA: Diagnosis not present

## 2016-09-07 DIAGNOSIS — M7581 Other shoulder lesions, right shoulder: Secondary | ICD-10-CM | POA: Diagnosis not present

## 2016-09-21 DIAGNOSIS — M7581 Other shoulder lesions, right shoulder: Secondary | ICD-10-CM | POA: Diagnosis not present

## 2016-10-04 IMAGING — CR DG HAND COMPLETE 3+V*L*
3 series · 3 of 3 positions shown · non-contrast
Comparison: None.

CLINICAL DATA: Cutter left thumb with the night this morning.
Laceration.

EXAM:
LEFT HAND - COMPLETE 3+ VIEW

[x hand pa left]
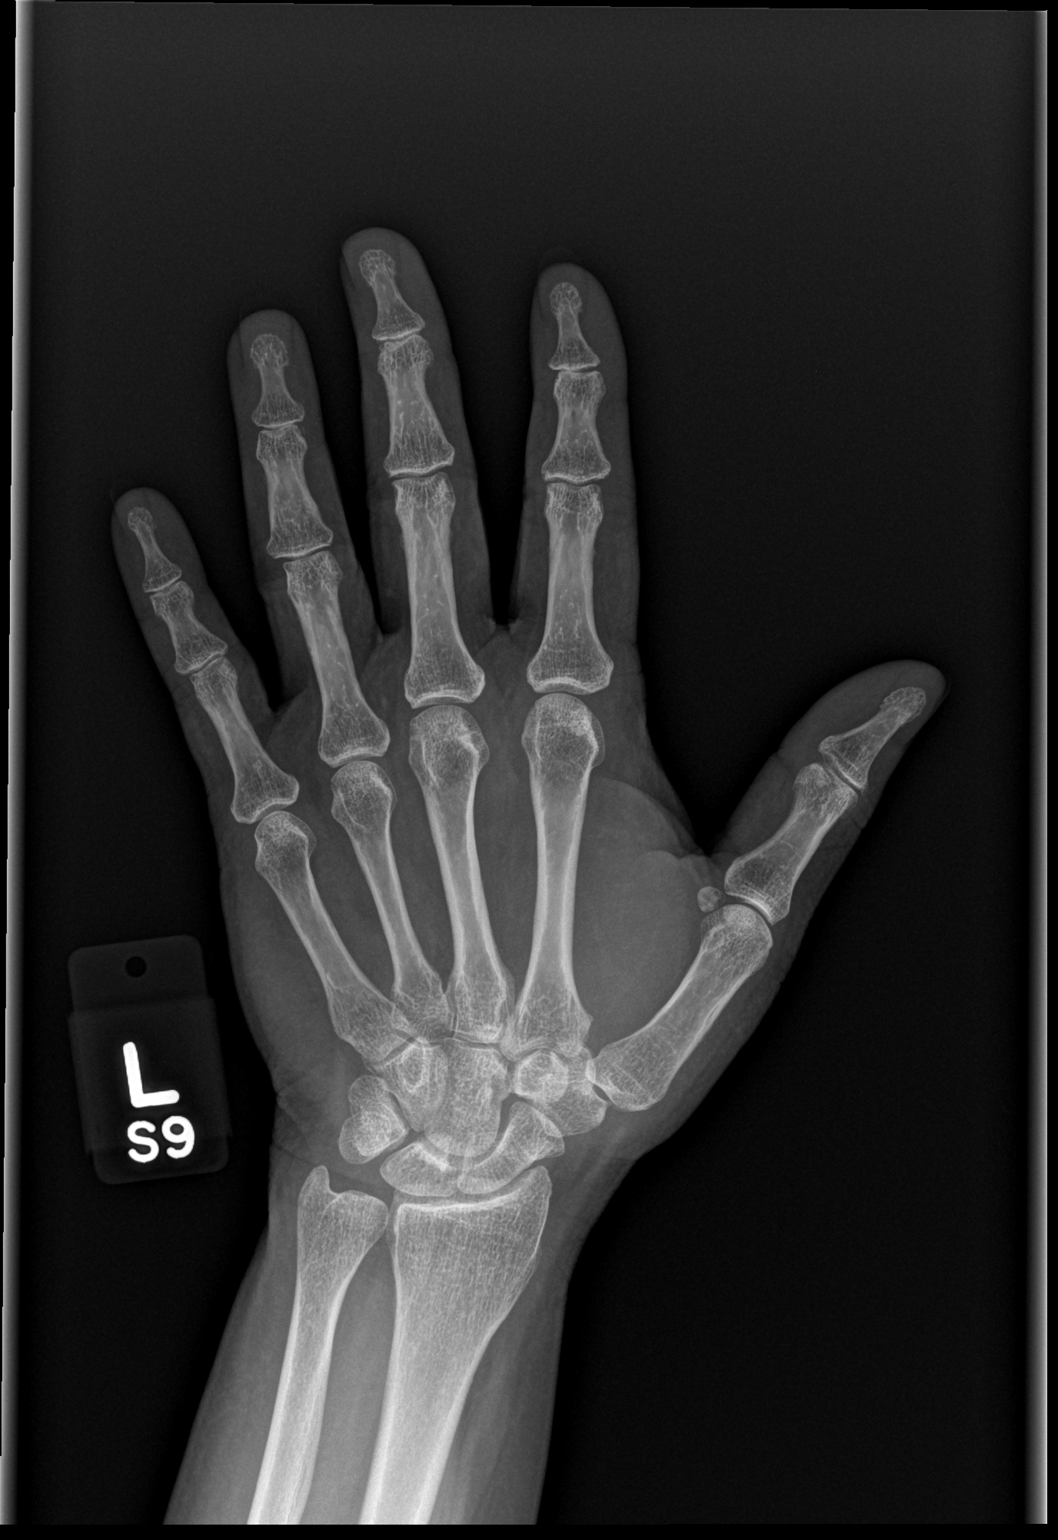

[x hand obl left]
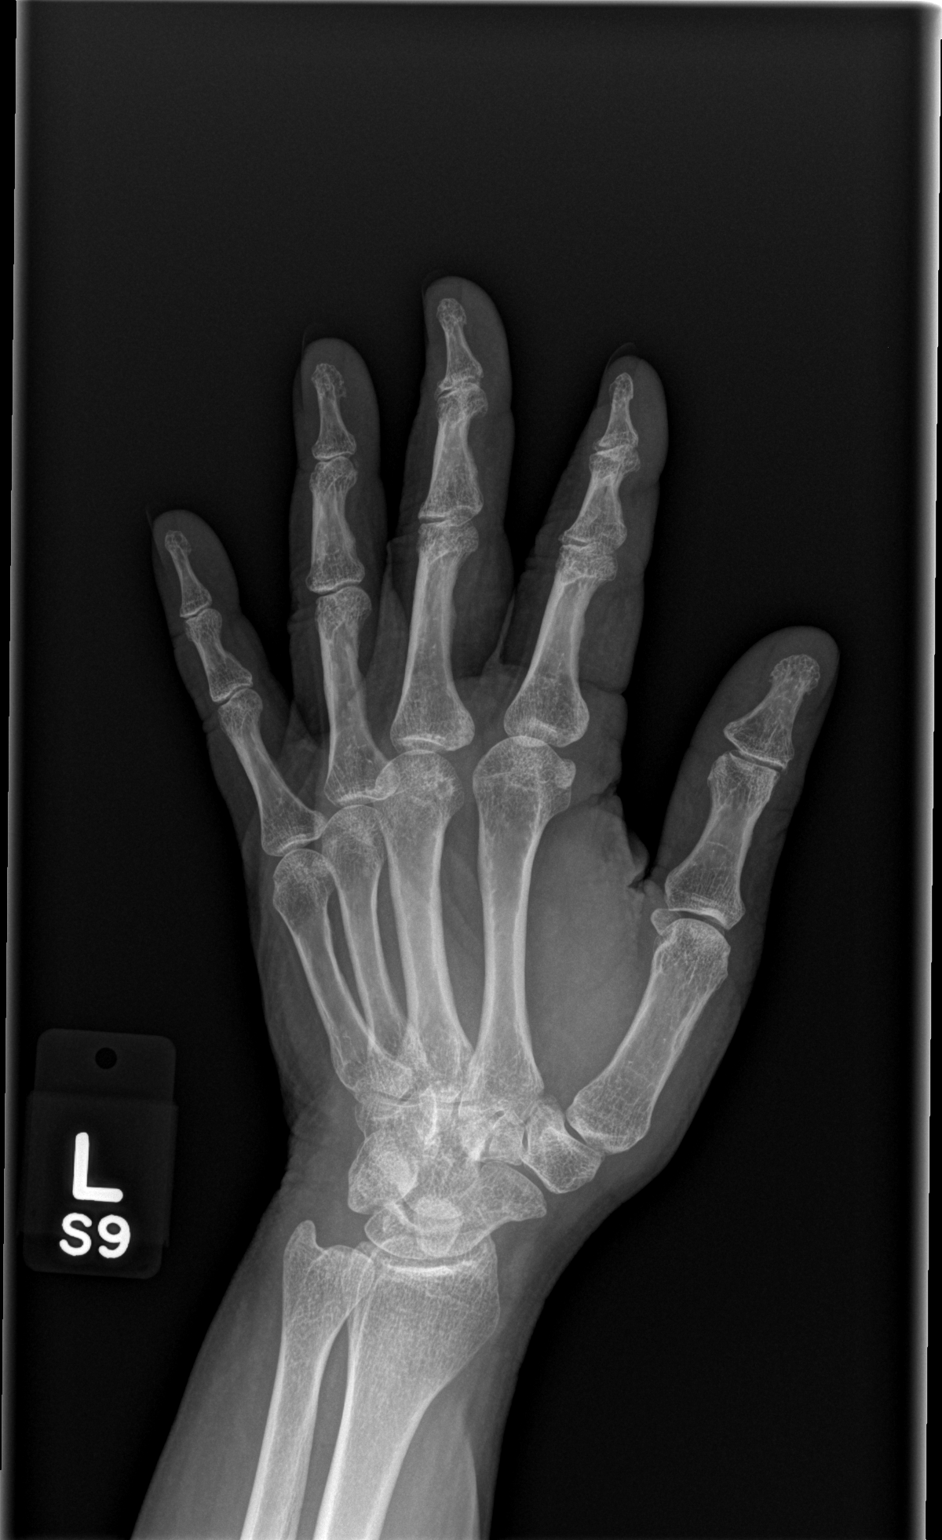

[x hand lat left]
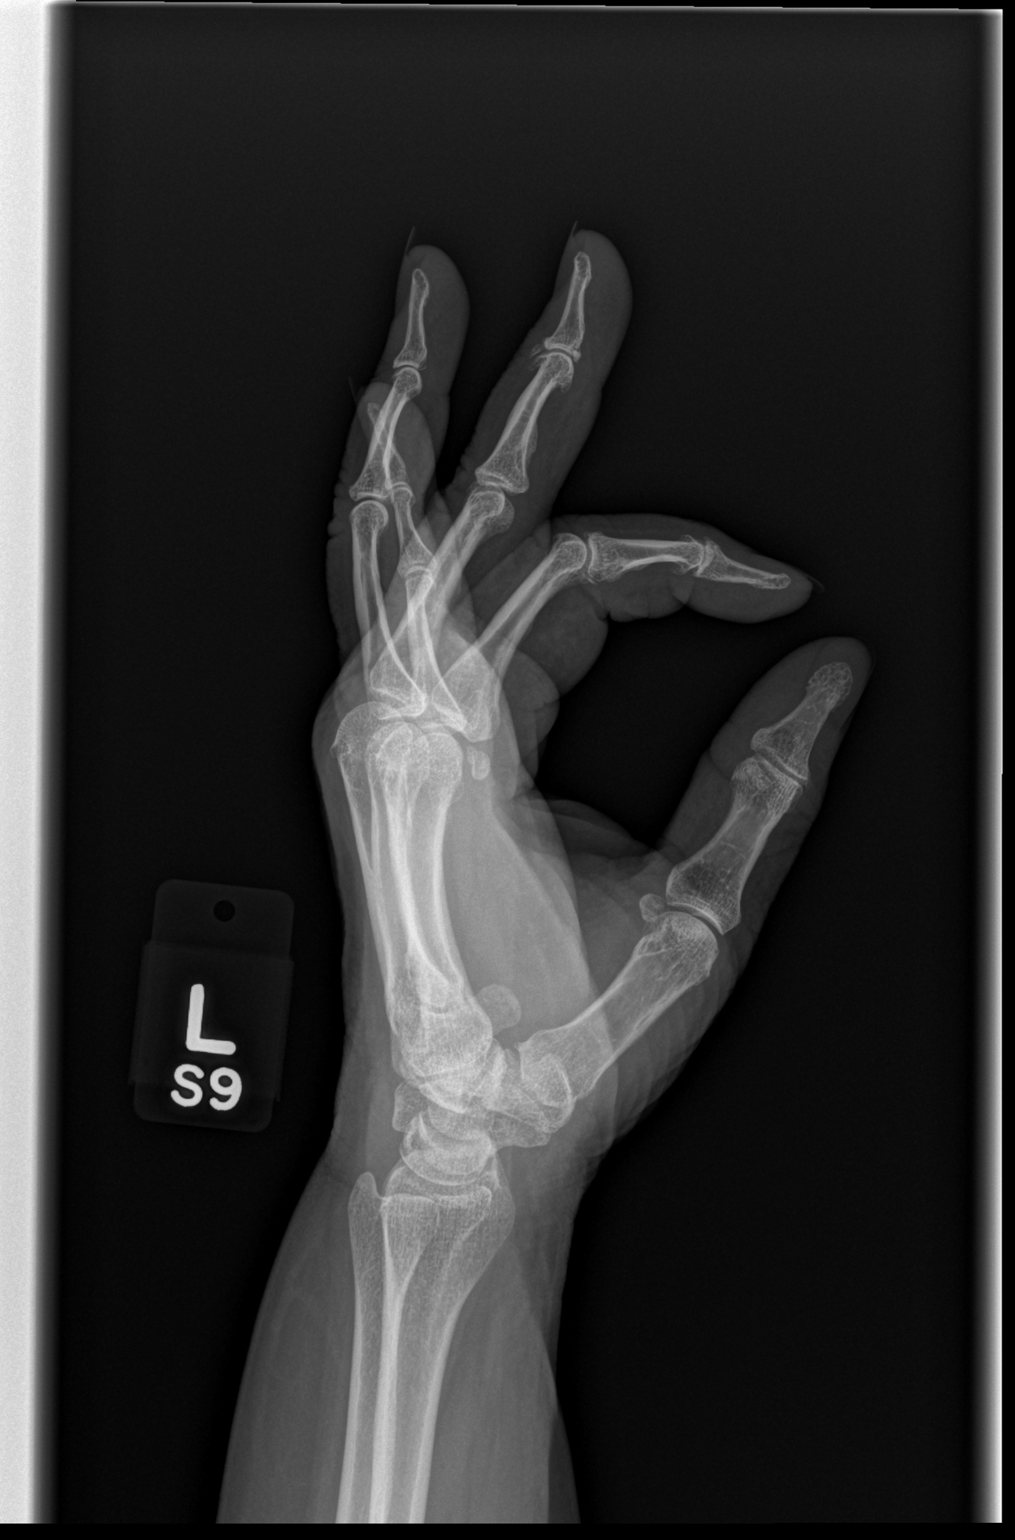

[3 of 3 positions shown; findings below may reference images not displayed]

FINDINGS: There is no evidence of fracture or dislocation. Mild osteoarthritis
of the first CMC joint. Mild osteoarthritis of the second, third and
fourth DIP joints. Soft tissues are unremarkable.
IMPRESSION: No acute osseous abnormality of the left hand.

## 2017-06-18 DIAGNOSIS — Z23 Encounter for immunization: Secondary | ICD-10-CM | POA: Diagnosis not present

## 2017-07-12 DIAGNOSIS — M1711 Unilateral primary osteoarthritis, right knee: Secondary | ICD-10-CM | POA: Diagnosis not present

## 2017-07-12 DIAGNOSIS — M238X1 Other internal derangements of right knee: Secondary | ICD-10-CM | POA: Diagnosis not present

## 2017-07-31 DIAGNOSIS — S83281A Other tear of lateral meniscus, current injury, right knee, initial encounter: Secondary | ICD-10-CM | POA: Diagnosis not present

## 2017-08-04 DIAGNOSIS — M25561 Pain in right knee: Secondary | ICD-10-CM | POA: Diagnosis not present

## 2017-08-07 ENCOUNTER — Other Ambulatory Visit: Payer: Self-pay | Admitting: Obstetrics & Gynecology

## 2017-08-07 DIAGNOSIS — I10 Essential (primary) hypertension: Secondary | ICD-10-CM

## 2017-08-08 NOTE — Telephone Encounter (Signed)
Medication refill request: Effexor  Last AEX:  06-08-16  Next AEX: 09-27-17  Last MMG (if hormonal medication request): 08-17-16 WNL  Refill authorized: please advise

## 2017-08-15 DIAGNOSIS — M25561 Pain in right knee: Secondary | ICD-10-CM | POA: Diagnosis not present

## 2017-08-20 DIAGNOSIS — Z780 Asymptomatic menopausal state: Secondary | ICD-10-CM | POA: Diagnosis not present

## 2017-08-20 DIAGNOSIS — Z1231 Encounter for screening mammogram for malignant neoplasm of breast: Secondary | ICD-10-CM | POA: Diagnosis not present

## 2017-08-20 DIAGNOSIS — M8589 Other specified disorders of bone density and structure, multiple sites: Secondary | ICD-10-CM | POA: Diagnosis not present

## 2017-08-30 DIAGNOSIS — S83281A Other tear of lateral meniscus, current injury, right knee, initial encounter: Secondary | ICD-10-CM | POA: Diagnosis not present

## 2017-08-30 DIAGNOSIS — S83241A Other tear of medial meniscus, current injury, right knee, initial encounter: Secondary | ICD-10-CM | POA: Diagnosis not present

## 2017-08-30 DIAGNOSIS — M94261 Chondromalacia, right knee: Secondary | ICD-10-CM | POA: Diagnosis not present

## 2017-08-30 DIAGNOSIS — G8918 Other acute postprocedural pain: Secondary | ICD-10-CM | POA: Diagnosis not present

## 2017-09-04 ENCOUNTER — Encounter: Payer: Self-pay | Admitting: Obstetrics & Gynecology

## 2017-09-10 DIAGNOSIS — S83281D Other tear of lateral meniscus, current injury, right knee, subsequent encounter: Secondary | ICD-10-CM | POA: Diagnosis not present

## 2017-09-10 DIAGNOSIS — S83241D Other tear of medial meniscus, current injury, right knee, subsequent encounter: Secondary | ICD-10-CM | POA: Diagnosis not present

## 2017-09-27 ENCOUNTER — Encounter: Payer: Self-pay | Admitting: Obstetrics & Gynecology

## 2017-09-27 ENCOUNTER — Ambulatory Visit (INDEPENDENT_AMBULATORY_CARE_PROVIDER_SITE_OTHER): Payer: Federal, State, Local not specified - PPO | Admitting: Obstetrics & Gynecology

## 2017-09-27 ENCOUNTER — Other Ambulatory Visit: Payer: Self-pay

## 2017-09-27 VITALS — BP 140/88 | HR 88 | Resp 18 | Ht 65.5 in | Wt 285.0 lb

## 2017-09-27 DIAGNOSIS — Z1211 Encounter for screening for malignant neoplasm of colon: Secondary | ICD-10-CM

## 2017-09-27 DIAGNOSIS — Z01419 Encounter for gynecological examination (general) (routine) without abnormal findings: Secondary | ICD-10-CM

## 2017-09-27 DIAGNOSIS — I10 Essential (primary) hypertension: Secondary | ICD-10-CM

## 2017-09-27 DIAGNOSIS — Z0000 Encounter for general adult medical examination without abnormal findings: Secondary | ICD-10-CM | POA: Diagnosis not present

## 2017-09-27 LAB — POCT URINALYSIS DIPSTICK
Bilirubin, UA: NEGATIVE
Glucose, UA: NEGATIVE
Ketones, UA: NEGATIVE
Leukocytes, UA: NEGATIVE
NITRITE UA: NEGATIVE
PROTEIN UA: NEGATIVE
RBC UA: NEGATIVE
Urobilinogen, UA: 0.2 E.U./dL
pH, UA: 5 (ref 5.0–8.0)

## 2017-09-27 MED ORDER — VENLAFAXINE HCL 75 MG PO TABS: 75.0000 mg | ORAL_TABLET | Freq: Every day | ORAL | 4 refills | Status: DC

## 2017-09-27 MED ORDER — MIRABEGRON ER 25 MG PO TB24: 25.0000 mg | ORAL_TABLET | Freq: Every day | ORAL | 2 refills | Status: DC

## 2017-09-27 NOTE — Progress Notes (Signed)
62 y.o. G0P0 MarriedCaucasianF here for annual exam.  Doing well.  No vaginal bleeding.  Had some chest pain.  Saw Dr. Stanford Breed.  Did stress test and echo.    Urinary urgency and leaking is worse this year.  Wearing a pad almost daily.   No LMP recorded. Patient has had a hysterectomy.          Sexually active: No.  The current method of family planning is hysterectomy.  Exercising: No.   Smoker:  no  Health Maintenance: Pap:  01/12/11 Neg  History of abnormal Pap:  no MMG:  08/20/17 BIRADS2:Benign  Colonoscopy:  2000 Normal  BMD:   08/20/17 Mild Osteopenia  TDaP:  2012 Pneumonia vaccine(s): Unsure Shingrix:   No Hep C testing: Done Screening Labs: Here today  UA: Normal    reports that she quit smoking about 10 years ago. she has never used smokeless tobacco. She reports that she drinks about 1.8 - 2.4 oz of alcohol per week. She reports that she does not use drugs.  Past Medical History:  Diagnosis Date  . Allergy   . Arthritis   . Chest pain   . Depression   . Fatigue   . GERD (gastroesophageal reflux disease)   . Hyperlipidemia   . Hypertension   . Joint pain   . Obesity   . Osteopenia   . Palpitation   . Sleep apnea   . SOB (shortness of breath)   . Urinary incontinence     Past Surgical History:  Procedure Laterality Date  . ABDOMINAL HYSTERECTOMY  08/10/2000   TAH/RSO  . BREAST SURGERY     Biopsy-Benign  . KNEE ARTHROSCOPY     left knee  . LAPAROSCOPIC CHOLECYSTECTOMY  2010  . LAPAROTOMY  1990   Fibroid removed  . RHINOPLASTY      Current Outpatient Medications  Medication Sig Dispense Refill  . aspirin 81 MG chewable tablet Chew 81 mg by mouth daily.    . Cholecalciferol (VITAMIN D3) 1000 UNITS CAPS Take 1 capsule by mouth daily.     Marland Kitchen HYDROcodone-acetaminophen (NORCO) 10-325 MG tablet daily as needed.  0  . ketoconazole (NIZORAL) 2 % cream APPLY 1 APPLICATION TO AFFECTED AREA OF FEET BID  0  . Loratadine (CLARITIN PO) Take 1 tablet by mouth daily.      . meloxicam (MOBIC) 15 MG tablet Take 1 tablet by mouth at bedtime.  3  . nitroGLYCERIN (NITROSTAT) 0.4 MG SL tablet Place 1 tablet (0.4 mg total) under the tongue every 5 (five) minutes as needed for chest pain. 12 tablet 0  . UNABLE TO FIND C PAP for sleep apnea     . venlafaxine (EFFEXOR) 75 MG tablet TAKE 1 TABLET(75 MG) BY MOUTH DAILY 90 tablet 0   No current facility-administered medications for this visit.     Family History  Problem Relation Age of Onset  . Breast cancer Mother        Age 14  . Hypertension Mother   . Hypertension Father   . Arthritis Father   . Heart disease Father   . Cancer Father        Spleen  . Heart disease Maternal Grandfather   . Colon cancer Paternal Grandfather        Colon cancer    ROS:  Pertinent items are noted in HPI.  Otherwise, a comprehensive ROS was negative.  Exam:   BP 140/88 (BP Location: Right Arm, Patient Position: Sitting, Cuff Size: Large)  Pulse 88   Resp 18   Ht 5' 5.5" (1.664 m)   Wt 285 lb (129.3 kg)   BMI 46.71 kg/m   Weight change: +7#   Height: 5' 5.5" (166.4 cm)  Ht Readings from Last 3 Encounters:  09/27/17 5' 5.5" (1.664 m)  06/20/16 5\' 6"  (1.676 m)  06/08/16 5\' 5"  (1.651 m)    General appearance: alert, cooperative and appears stated age Head: Normocephalic, without obvious abnormality, atraumatic Neck: no adenopathy, supple, symmetrical, trachea midline and thyroid normal to inspection and palpation Lungs: clear to auscultation bilaterally Breasts: normal appearance, no masses or tenderness Heart: regular rate and rhythm Abdomen: soft, non-tender; bowel sounds normal; no masses,  no organomegaly Extremities: extremities normal, atraumatic, no cyanosis or edema Skin: Skin color, texture, turgor normal. No rashes or lesions Lymph nodes: Cervical, supraclavicular, and axillary nodes normal. No abnormal inguinal nodes palpated Neurologic: Grossly normal   Pelvic: External genitalia:  no lesions               Urethra:  normal appearing urethra with no masses, tenderness or lesions              Bartholins and Skenes: normal                 Vagina: normal appearing vagina with normal color and discharge, no lesions              Cervix: absent              Pap taken: No. Bimanual Exam:  Uterus:  uterus absent              Adnexa: no mass, fullness, tenderness               Rectovaginal: Confirms               Anus:  normal sphincter tone, no lesions  Chaperone was present for exam.  A:  Well Woman with normal exam H/O TAH/RSO 12/01 Morbid obesity OAB H/O depression  P:   Mammogram guidelines reviewed. Referral for screening colonoscopy placed today pap smear no indicated CMP, TSH, Lipids obtained today Trial of myrbetriq 25mg  daily.  #30.  Recheck BP 2-3 weeks. Effexor 75mg  daily.  Does not take BID dosing.  #90/4RF. Return annually or prn

## 2017-09-28 LAB — LIPID PANEL
CHOLESTEROL TOTAL: 223 mg/dL — AB (ref 100–199)
Chol/HDL Ratio: 3.1 ratio (ref 0.0–4.4)
HDL: 71 mg/dL (ref >39)
LDL Calculated: 133 mg/dL — ABNORMAL HIGH (ref 0–99)
TRIGLYCERIDES: 93 mg/dL (ref 0–149)
VLDL CHOLESTEROL CAL: 19 mg/dL (ref 5–40)

## 2017-09-28 LAB — COMPREHENSIVE METABOLIC PANEL
ALK PHOS: 123 IU/L — AB (ref 39–117)
ALT: 14 IU/L (ref 0–32)
AST: 17 IU/L (ref 0–40)
Albumin/Globulin Ratio: 1.7 (ref 1.2–2.2)
Albumin: 4.3 g/dL (ref 3.6–4.8)
BUN/Creatinine Ratio: 35 — ABNORMAL HIGH (ref 12–28)
BUN: 24 mg/dL (ref 8–27)
Bilirubin Total: 0.4 mg/dL (ref 0.0–1.2)
CALCIUM: 9.1 mg/dL (ref 8.7–10.3)
CO2: 22 mmol/L (ref 20–29)
CREATININE: 0.68 mg/dL (ref 0.57–1.00)
Chloride: 105 mmol/L (ref 96–106)
GFR calc Af Amer: 109 mL/min/{1.73_m2} (ref >59)
GFR calc non Af Amer: 95 mL/min/{1.73_m2} (ref >59)
GLOBULIN, TOTAL: 2.6 g/dL (ref 1.5–4.5)
Glucose: 92 mg/dL (ref 65–99)
POTASSIUM: 4.5 mmol/L (ref 3.5–5.2)
SODIUM: 143 mmol/L (ref 134–144)
Total Protein: 6.9 g/dL (ref 6.0–8.5)

## 2017-09-28 LAB — TSH: TSH: 3.45 u[IU]/mL (ref 0.450–4.500)

## 2017-10-02 ENCOUNTER — Telehealth: Payer: Self-pay | Admitting: Obstetrics & Gynecology

## 2017-10-02 NOTE — Telephone Encounter (Signed)
Spoke with patient, requesting alternative to Myrbetriq d/t out of pocket cost, $240 for 30 day supply.  Advised patient to f/u with insurance provider for list of alternatives on formulary, return call to office, will then review with Dr. Sabra Heck. Patient is agreeable, request name of medication and dosage be sent via [place].   See MyChart message to patient.

## 2017-10-02 NOTE — Telephone Encounter (Signed)
Patient would like to change her myrbetriq prescription due to high cost.

## 2017-10-08 ENCOUNTER — Telehealth: Payer: Self-pay | Admitting: Obstetrics & Gynecology

## 2017-10-08 DIAGNOSIS — S83281D Other tear of lateral meniscus, current injury, right knee, subsequent encounter: Secondary | ICD-10-CM | POA: Diagnosis not present

## 2017-10-08 DIAGNOSIS — S83241D Other tear of medial meniscus, current injury, right knee, subsequent encounter: Secondary | ICD-10-CM | POA: Diagnosis not present

## 2017-10-08 NOTE — Telephone Encounter (Signed)
Patient called and cancelled her appointment on 10/11/17 to check her blood pressure. She said she's not started the medication yet and she will call back to reschedule once she does.

## 2017-10-11 ENCOUNTER — Ambulatory Visit: Payer: Federal, State, Local not specified - PPO

## 2017-10-18 NOTE — Telephone Encounter (Signed)
Patient previously advised to return call to office, will close encounter.   Routing to Dr. Sabra Heck for final review.

## 2017-10-23 ENCOUNTER — Encounter: Payer: Self-pay | Admitting: Gastroenterology

## 2017-11-28 ENCOUNTER — Ambulatory Visit (AMBULATORY_SURGERY_CENTER): Payer: Self-pay | Admitting: *Deleted

## 2017-11-28 ENCOUNTER — Other Ambulatory Visit: Payer: Self-pay

## 2017-11-28 VITALS — Ht 66.5 in | Wt 288.0 lb

## 2017-11-28 DIAGNOSIS — Z1211 Encounter for screening for malignant neoplasm of colon: Secondary | ICD-10-CM

## 2017-11-28 MED ORDER — NA SULFATE-K SULFATE-MG SULF 17.5-3.13-1.6 GM/177ML PO SOLN: ORAL | 0 refills | Status: DC

## 2017-11-28 NOTE — Progress Notes (Signed)
Patient denies any allergies to eggs or soy. Patient denies any problems with anesthesia/sedation. Patient denies any oxygen use at home. Patient denies taking any diet/weight loss medications or blood thinners. EMMI education assisgned to patient on colonoscopy, this was explained and instructions given to patient. 

## 2017-12-06 ENCOUNTER — Encounter: Payer: Self-pay | Admitting: Gastroenterology

## 2017-12-19 ENCOUNTER — Other Ambulatory Visit: Payer: Self-pay

## 2017-12-19 ENCOUNTER — Encounter: Payer: Self-pay | Admitting: Gastroenterology

## 2017-12-19 ENCOUNTER — Ambulatory Visit (AMBULATORY_SURGERY_CENTER): Payer: Federal, State, Local not specified - PPO | Admitting: Gastroenterology

## 2017-12-19 VITALS — BP 113/73 | HR 69 | Temp 98.6°F | Resp 16 | Ht 65.0 in | Wt 285.0 lb

## 2017-12-19 DIAGNOSIS — D120 Benign neoplasm of cecum: Secondary | ICD-10-CM | POA: Diagnosis not present

## 2017-12-19 DIAGNOSIS — Z1211 Encounter for screening for malignant neoplasm of colon: Secondary | ICD-10-CM | POA: Diagnosis present

## 2017-12-19 MED ORDER — SODIUM CHLORIDE 0.9 % IV SOLN: 500.0000 mL | Freq: Once | INTRAVENOUS | Status: DC

## 2017-12-19 NOTE — Op Note (Signed)
[facility] Patient Name: Cheryl [name] Procedure Date: 12/19/2017 9:13 AM MRN: [id number] Endoscopist: Remo Lipps P. Armbruster MD, MD Age: 62 Referring MD:  Date of Birth: 10-Mar-1956 Gender: Female Account #: [id number] Procedure:                Colonoscopy Indications:              Screening for colorectal malignant neoplasm Medicines:                Monitored Anesthesia Care Procedure:                Pre-Anesthesia Assessment:                           - Prior to the procedure, a History and Physical                            was performed, and patient medications and                            allergies were reviewed. The patient's tolerance of                            previous anesthesia was also reviewed. The risks                            and benefits of the procedure and the sedation                            options and risks were discussed with the patient.                            All questions were answered, and informed consent                            was obtained. Prior Anticoagulants: The patient has                            taken no previous anticoagulant or antiplatelet                            agents. ASA Grade Assessment: III - A patient with                            severe systemic disease. After reviewing the risks                            and benefits, the patient was deemed in                            satisfactory condition to undergo the procedure.                           After obtaining informed consent, the colonoscope  was passed under direct vision. Throughout the                            procedure, the patient's blood pressure, pulse, and                            oxygen saturations were monitored continuously. The                            Model CF-HQ190L [id number]) scope was introduced                            through the anus and advanced to the the cecum,   identified by appendiceal orifice and ileocecal                            valve. The colonoscopy was performed without                            difficulty. The patient tolerated the procedure                            well. The quality of the bowel preparation was                            good. The ileocecal valve, appendiceal orifice, and                            rectum were photographed. Scope In: 9:20:40 AM Scope Out: 9:79:89 AM Scope Withdrawal Time: 0 hours 13 minutes 23 seconds  Total Procedure Duration: 0 hours 16 minutes 6 seconds  Findings:                 The perianal and digital rectal examinations were                            normal.                           A 10 mm polyp was found in the cecum. The polyp was                            flat. The polyp was removed with a cold snare.                            Resection and retrieval were complete.                           A few small-mouthed diverticula were found in the                            sigmoid colon.                           Internal hemorrhoids were found during retroflexion.  The exam was otherwise without abnormality. Complications:            No immediate complications. Estimated blood loss:                            Minimal. Estimated Blood Loss:     Estimated blood loss was minimal. Impression:               - One 10 mm polyp in the cecum, removed with a cold                            snare. Resected and retrieved.                           - Diverticulosis in the sigmoid colon.                           - Internal hemorrhoids.                           - The examination was otherwise normal. Recommendation:           - Patient has a contact number available for                            emergencies. The signs and symptoms of potential                            delayed complications were discussed with the                            patient. Return to normal  activities tomorrow.                            Written discharge instructions were provided to the                            patient.                           - Resume previous diet.                           - Continue present medications.                           - Await pathology results.                           - Repeat colonoscopy date to be determined after                            pending pathology results are reviewed for                            surveillance based on pathology results. Remo Lipps P. Armbruster MD, MD 12/19/2017 9:41:24 AM This report has been signed electronically.

## 2017-12-19 NOTE — Patient Instructions (Signed)
Thank you for allowing [name] to care for you today!  Await pathology.  Handouts given for Polyps and Diverticulosis.   YOU HAD AN ENDOSCOPIC PROCEDURE TODAY AT [facility]:   Refer to the procedure report that was given to you for any specific questions about what was found during the examination.  If the procedure report does not answer your questions, please call your gastroenterologist to clarify.  If you requested that your care partner not be given the details of your procedure findings, then the procedure report has been included in a sealed envelope for you to review at your convenience later.  YOU SHOULD EXPECT: Some feelings of bloating in the abdomen. Passage of more gas than usual.  Walking can help get rid of the air that was put into your GI tract during the procedure and reduce the bloating. If you had a lower endoscopy (such as a colonoscopy or flexible sigmoidoscopy) you may notice spotting of blood in your stool or on the toilet paper. If you underwent a bowel prep for your procedure, you may not have a normal bowel movement for a few days.  Please Note:  You might notice some irritation and congestion in your nose or some drainage.  This is from the oxygen used during your procedure.  There is no need for concern and it should clear up in a day or so.  SYMPTOMS TO REPORT IMMEDIATELY:   Following lower endoscopy (colonoscopy or flexible sigmoidoscopy):  Excessive amounts of blood in the stool  Significant tenderness or worsening of abdominal pains  Swelling of the abdomen that is new, acute  Fever of 100F or higher     For urgent or emergent issues, a gastroenterologist can be reached at any hour by calling [phone].   DIET:  We do recommend a small meal at first, but then you may proceed to your regular diet.  Drink plenty of fluids but you should avoid alcoholic beverages for 24 hours.  ACTIVITY:  You should plan to take it easy for the rest of  today and you should NOT DRIVE or use heavy machinery until tomorrow (because of the sedation medicines used during the test).    FOLLOW UP: Our staff will call the number listed on your records the next business day following your procedure to check on you and address any questions or concerns that you may have regarding the information given to you following your procedure. If we do not reach you, we will leave a message.  However, if you are feeling well and you are not experiencing any problems, there is no need to return our call.  We will assume that you have returned to your regular daily activities without incident.  If any biopsies were taken you will be contacted by phone or by letter within the next 1-3 weeks.  Please call us at [phone] if you have not heard about the biopsies in 3 weeks.    SIGNATURES/CONFIDENTIALITY: You and/or your care partner have signed paperwork which will be entered into your electronic medical record.  These signatures attest to the fact that that the information above on your After Visit Summary has been reviewed and is understood.  Full responsibility of the confidentiality of this discharge information lies with you and/or your care-partner.

## 2017-12-19 NOTE — Progress Notes (Signed)
To PACU, VSS. Report to [email or web address] 

## 2017-12-19 NOTE — Progress Notes (Signed)
Called to room to assist during endoscopic procedure.  Patient ID and intended procedure confirmed with present staff. Received instructions for my participation in the procedure from the performing physician.  

## 2017-12-20 ENCOUNTER — Telehealth: Payer: Self-pay

## 2017-12-20 NOTE — Telephone Encounter (Signed)
  Follow up Call-  Call back number 12/19/2017  Post procedure Call Back phone  # [phone]  Permission to leave phone message Yes  Some recent data might be hidden     Patient questions:  Do you have a fever, pain , or abdominal swelling? No. Pain Score  0 *  Have you tolerated food without any problems? Yes.    Have you been able to return to your normal activities? Yes.    Do you have any questions about your discharge instructions: Diet   No. Medications  No. Follow up visit  No.  Do you have questions or concerns about your Care? No.  Actions: * If pain score is 4 or above: No action needed, pain <4.

## 2017-12-20 NOTE — Telephone Encounter (Signed)
  Follow up Call-  Call back number 12/19/2017  Post procedure Call Back phone  # [phone]  Permission to leave phone message Yes  Some recent data might be hidden     Patient questions:  Do you have a fever, pain , or abdominal swelling? No. Pain Score  0 *  Have you tolerated food without any problems? Yes.    Have you been able to return to your normal activities? Yes.    Do you have any questions about your discharge instructions: Diet   No. Medications  No. Follow up visit  No.  Do you have questions or concerns about your Care? No.  Actions: * If pain score is 4 or above: No action needed, pain <4.

## 2017-12-20 NOTE — Telephone Encounter (Signed)
-----   Message from Yetta Flock, MD sent at 12/19/2017 12:12 PM EDT ----- Regarding: banding Hi Jan, Can you help schedule this patient for a routine banding? Thanks

## 2017-12-20 NOTE — Telephone Encounter (Signed)
Called pt and LM to call back to schedule.

## 2017-12-21 NOTE — Telephone Encounter (Signed)
Called and spoke to pt.  She would would to consider bandings but needs to see if it is covered by her insurance.  I am sending her a brochure and a business card. She will contact her insurance company and if it is covered she will call back and schedule appts.

## 2017-12-24 ENCOUNTER — Encounter: Payer: Self-pay | Admitting: Gastroenterology

## 2018-01-31 ENCOUNTER — Encounter (INDEPENDENT_AMBULATORY_CARE_PROVIDER_SITE_OTHER): Payer: Federal, State, Local not specified - PPO

## 2018-02-06 ENCOUNTER — Encounter (INDEPENDENT_AMBULATORY_CARE_PROVIDER_SITE_OTHER): Payer: Self-pay | Admitting: Family Medicine

## 2018-02-06 ENCOUNTER — Ambulatory Visit (INDEPENDENT_AMBULATORY_CARE_PROVIDER_SITE_OTHER): Payer: Federal, State, Local not specified - PPO | Admitting: Family Medicine

## 2018-02-06 VITALS — BP 158/81 | HR 75 | Temp 97.6°F | Ht 66.0 in | Wt 281.0 lb

## 2018-02-06 DIAGNOSIS — Z9189 Other specified personal risk factors, not elsewhere classified: Secondary | ICD-10-CM

## 2018-02-06 DIAGNOSIS — R0602 Shortness of breath: Secondary | ICD-10-CM | POA: Diagnosis not present

## 2018-02-06 DIAGNOSIS — R5383 Other fatigue: Secondary | ICD-10-CM

## 2018-02-06 DIAGNOSIS — Z6841 Body Mass Index (BMI) 40.0 and over, adult: Secondary | ICD-10-CM | POA: Diagnosis not present

## 2018-02-06 DIAGNOSIS — Z1331 Encounter for screening for depression: Secondary | ICD-10-CM | POA: Diagnosis not present

## 2018-02-06 DIAGNOSIS — Z0289 Encounter for other administrative examinations: Secondary | ICD-10-CM

## 2018-02-06 DIAGNOSIS — I10 Essential (primary) hypertension: Secondary | ICD-10-CM | POA: Insufficient documentation

## 2018-02-06 NOTE — Progress Notes (Signed)
Office: [phone]  /  Fax: [phone]   Dear Dr. Percell Miller,   Thank you for referring Cheryl [name] to our clinic. The following note includes my evaluation and treatment recommendations.  HPI:   Chief Complaint: OBESITY    Cheryl [name] has been referred by Cheryl Lynch, MD for consultation regarding her obesity and obesity related comorbidities.    Cheryl [name] (MR# [id number]) is a 62 y.o. female who presents on 02/06/2018 for obesity evaluation and treatment. Current BMI is Body mass index is 45.35 kg/m.Marland Kitchen Cheryl [name] has been struggling with her weight for many years and has been unsuccessful in either losing weight, maintaining weight loss, or reaching her healthy weight goal.     Cheryl [name] attended our information session and states she is currently in the action stage of change and ready to dedicate time achieving and maintaining a healthier weight. Cheryl [name] is interested in becoming our patient and working on intensive lifestyle modifications including (but not limited to) diet, exercise and weight loss.    Erlean states she thinks her family will eat healthier with  her her desired weight loss is approximately 85 lbs she has been heavy most of  her life she started gaining weight 20 yrs ago her heaviest weight ever was 285 lbs. she has significant food cravings issues  she skips meals frequently she is frequently drinking liquids with calories she frequently makes poor food choices she frequently eats larger portions than normal  she has binge eating behaviors she struggles with emotional eating    Fatigue Cheryl [name] feels her energy is lower than it should be. This has worsened with weight gain and has not worsened recently. Cheryl [name] admits to daytime somnolence and admits to waking up still tired. Patient is at risk for obstructive sleep apnea. Patent has a history of symptoms of daytime fatigue, morning fatigue and hypertension. Patient generally gets 6 hours of sleep per  night, and states they generally have restless sleep. Snoring is present. Apneic episodes are present. Epworth Sleepiness Score is 13  Dyspnea on exertion Cheryl [name] notes increasing shortness of breath with exercising and seems to be worsening over time with weight gain. She notes getting out of breath sooner with activity than she used to. This has not gotten worse recently. Cheryl [name] denies orthopnea.  Hypertension Cheryl [name] is a 62 y.o. female with hypertension. Cheryl [name] states she gets a cough with ACE-I  Cheryl [name] is not on medications. Her blood pressure appears higher than average and may be white coat syndrome. Cheryl [name] denies chest pain.. She is attempting to work on weight loss to help control her blood pressure with the goal of decreasing her risk of heart attack and stroke. Cheryl [name] blood pressure is not currently controlled.  At risk for cardiovascular disease Cheryl [name] is at a higher than average risk for cardiovascular disease due to obesity and hypertension. She currently denies any chest pain.  Depression Screen Cheryl [name] Food and Mood (modified PHQ-9) score was  Depression screen PHQ 2/9 02/06/2018  Decreased Interest 3  Down, Depressed, Hopeless 1  PHQ - 2 Score 4  Altered sleeping 2  Tired, decreased energy 3  Change in appetite 1  Feeling bad or failure about yourself  1  Trouble concentrating 1  Moving slowly or fidgety/restless 1  Suicidal thoughts 0  PHQ-9 Score 13  Difficult doing work/chores Somewhat difficult    ALLERGIES: Allergies  Allergen Reactions  . Ace Inhibitors Cough  . Lisinopril Cough  . Neosporin [Neomycin-Bacitracin Zn-Polymyx]  Rash  . Z-Pak [Azithromycin] Other (See Comments)    thrush    MEDICATIONS: Current Outpatient Medications on File Prior to Visit  Medication Sig Dispense Refill  . aspirin 81 MG chewable tablet Chew 81 mg by mouth daily.    . Cholecalciferol (VITAMIN D3) 1000 UNITS CAPS Take 1 capsule by mouth daily.     .  Loratadine (CLARITIN PO) Take 1 tablet by mouth daily.     . meloxicam (MOBIC) 15 MG tablet Take 1 tablet by mouth at bedtime.  3  . nitroGLYCERIN (NITROSTAT) 0.4 MG SL tablet Place 1 tablet (0.4 mg total) under the tongue every 5 (five) minutes as needed for chest pain. 12 tablet 0  . UNABLE TO FIND C PAP for sleep apnea     . venlafaxine (EFFEXOR) 75 MG tablet Take 1 tablet (75 mg total) by mouth daily. 90 tablet 4   Current Facility-Administered Medications on File Prior to Visit  Medication Dose Route Frequency Provider Last Rate Last Dose  . 0.9 %  sodium chloride infusion  500 mL Intravenous Once Armbruster, Carlota Raspberry, MD        PAST MEDICAL HISTORY: Past Medical History:  Diagnosis Date  . Allergy   . Arthritis   . Chest pain   . Depression   . Fatigue   . GERD (gastroesophageal reflux disease)   . Hyperlipidemia   . Hypertension   . Joint pain   . Obesity   . Osteopenia   . Palpitation   . Sleep apnea    uses CPAP   . SOB (shortness of breath)   . Urinary incontinence     PAST SURGICAL HISTORY: Past Surgical History:  Procedure Laterality Date  . ABDOMINAL HYSTERECTOMY  08/10/2000   TAH/RSO  . BREAST SURGERY     Biopsy-Benign  . COLOSTOMY  2000   Dr.Patterson  . KNEE ARTHROSCOPY     left knee  . LAPAROSCOPIC CHOLECYSTECTOMY  2010  . LAPAROTOMY  1990   Fibroid removed-ovarian cyst   . RHINOPLASTY      SOCIAL HISTORY: Social History   Tobacco Use  . Smoking status: Former Smoker    Last attempt to quit: 08/15/2007    Years since quitting: 10.4  . Smokeless tobacco: Never Used  Substance Use Topics  . Alcohol use: Yes    Alcohol/week: 1.8 oz    Types: 3 Standard drinks or equivalent per week    Comment: 3 drinks per week per pt   . Drug use: No    FAMILY HISTORY: Family History  Problem Relation Age of Onset  . Breast cancer Mother        Age 83  . Hypertension Mother   . Depression Mother   . Hypertension Father   . Arthritis Father   .  Heart disease Father   . Cancer Father        Spleen  . Sleep apnea Father   . Heart disease Maternal Grandfather   . Colon cancer Paternal Grandfather        Colon cancer  . Esophageal cancer Neg Hx   . Stomach cancer Neg Hx     ROS: Review of Systems  Constitutional: Positive for malaise/fatigue.  HENT: Positive for congestion (nasal stuffiness), hearing loss and tinnitus.        Nasal discharge Dentures/Crowns  Eyes:       Wear Glasses or Contacts  Respiratory: Positive for shortness of breath (on exertion) and wheezing.   Cardiovascular: Negative  for chest pain and orthopnea.       Leg Cramping  Genitourinary: Positive for frequency.  Musculoskeletal:       Muscle Stiffness Muscle or Joint Pain  Skin:       Dryness   Neurological: Positive for weakness.  Psychiatric/Behavioral: The patient has insomnia.        Stress     PHYSICAL EXAM: Blood pressure (!) 158/81, pulse 75, temperature 97.6 F (36.4 C), temperature source Oral, height 5\' 6"  (1.676 m), weight 281 lb (127.5 kg), SpO2 96 %. Body mass index is 45.35 kg/m. Physical Exam  Constitutional: She is oriented to person, place, and time. She appears well-developed and well-nourished.  HENT:  Head: Normocephalic and atraumatic.  Nose: Nose normal.  Eyes: EOM are normal. No scleral icterus.  Neck: Normal range of motion. Neck supple. No thyromegaly present.  Cardiovascular: Normal rate and regular rhythm.  Pulmonary/Chest: Effort normal. No respiratory distress.  Abdominal: Soft. There is no tenderness.  + obesity  Musculoskeletal: Normal range of motion.  Range of Motion normal in all 4 extremities  Neurological: She is alert and oriented to person, place, and time. Coordination normal.  Skin: Skin is warm and dry.  Psychiatric: She has a normal mood and affect. Her behavior is normal.  Vitals reviewed.   RECENT LABS AND TESTS: BMET    Component Value Date/Time   NA 143 09/27/2017 1404   K 4.5  09/27/2017 1404   CL 105 09/27/2017 1404   CO2 22 09/27/2017 1404   GLUCOSE 92 09/27/2017 1404   GLUCOSE 95 06/08/2016 1353   BUN 24 09/27/2017 1404   CREATININE 0.68 09/27/2017 1404   CREATININE 0.81 06/08/2016 1353   CALCIUM 9.1 09/27/2017 1404   GFRNONAA 95 09/27/2017 1404   GFRAA 109 09/27/2017 1404   No results found for: HGBA1C No results found for: INSULIN CBC    Component Value Date/Time   WBC 5.0 06/08/2016 1353   RBC 4.94 06/08/2016 1353   HGB 13.6 06/08/2016 1353   HGB 14.3 04/02/2014 0937   HCT 43.6 06/08/2016 1353   PLT 263 06/08/2016 1353   MCV 88.3 06/08/2016 1353   MCV 86.8 09/17/2011 1415   MCH 27.5 06/08/2016 1353   MCHC 31.2 (L) 06/08/2016 1353   RDW 14.4 06/08/2016 1353   LYMPHSABS 1,400 06/08/2016 1353   MONOABS 450 06/08/2016 1353   EOSABS 100 06/08/2016 1353   BASOSABS 50 06/08/2016 1353   Iron/TIBC/Ferritin/ %Sat No results found for: IRON, TIBC, FERRITIN, IRONPCTSAT Lipid Panel     Component Value Date/Time   CHOL 223 (H) 09/27/2017 1404   TRIG 93 09/27/2017 1404   HDL 71 09/27/2017 1404   CHOLHDL 3.1 09/27/2017 1404   CHOLHDL 3.4 06/08/2016 1353   VLDL 19 06/08/2016 1353   LDLCALC 133 (H) 09/27/2017 1404   Hepatic Function Panel     Component Value Date/Time   PROT 6.9 09/27/2017 1404   ALBUMIN 4.3 09/27/2017 1404   AST 17 09/27/2017 1404   ALT 14 09/27/2017 1404   ALKPHOS 123 (H) 09/27/2017 1404   BILITOT 0.4 09/27/2017 1404      Component Value Date/Time   TSH 3.450 09/27/2017 1404   TSH 2.81 06/08/2016 1353   TSH 4.431 04/27/2015 1540   Vitamin D Results for KALYA, TROEGER "[name]" (MRN [id number]) as of 02/06/2018 10:07  Ref. Range 04/27/2015 15:40  Vitamin D, 25-Hydroxy Latest Ref Range: 30 - 100 ng/mL 36    ECG  shows NSR with  a rate of 75 BPM INDIRECT CALORIMETER done today shows a VO2 of 186 and a REE of 1291.  Her calculated basal metabolic rate is 3329 thus her basal metabolic rate is worse than  expected.    ASSESSMENT AND PLAN: Other fatigue - Plan: EKG 12-Lead, CBC With Differential, Comprehensive metabolic panel, Folate, Hemoglobin A1c, Insulin, random, Lipid Panel With LDL/HDL Ratio, T3, T4, free, TSH, Vitamin B12, VITAMIN D 25 Hydroxy (Vit-D Deficiency, Fractures)  Shortness of breath on exertion - Plan: CBC With Differential  Essential hypertension - Plan: Comprehensive metabolic panel  Depression screening  At risk for heart disease  Class 3 severe obesity with serious comorbidity and body mass index (BMI) of 45.0 to 49.9 in adult, unspecified obesity type (HCC)  PLAN: Fatigue Zayra was informed that her fatigue may be related to obesity, depression or many other causes. Labs will be ordered, and in the meanwhile Aleatha has agreed to work on diet, exercise and weight loss to help with fatigue. Proper sleep hygiene was discussed including the need for 7-8 hours of quality sleep each night. A sleep study was not ordered based on symptoms and Epworth score.  Dyspnea on exertion Calyn's shortness of breath appears to be obesity related and exercise induced. She has agreed to work on weight loss and gradually increase exercise to treat her exercise induced shortness of breath. If Jairy follows our instructions and loses weight without improvement of her shortness of breath, we will plan to refer to pulmonology. We will monitor this condition regularly. Logann agrees to this plan.  Hypertension We discussed sodium restriction, working on healthy weight loss, and a regular exercise program as the means to achieve improved blood pressure control. Matisha agreed with this plan and agreed to follow up as directed. We will check labs and will recheck blood pressure in 2 weeks. We will continue to monitor her blood pressure as well as her progress with the above lifestyle modifications. She will continue her medications as prescribed and will watch for signs of hypotension as she  continues her lifestyle modifications.  Cardiovascular risk counseling Kollins was given extended (15 minutes) coronary artery disease prevention counseling today. She is 62 y.o. female and has risk factors for heart disease including obesity and hypertension. We discussed intensive lifestyle modifications today with an emphasis on specific weight loss instructions and strategies. Pt was also informed of the importance of increasing exercise and decreasing saturated fats to help prevent heart disease.  Depression Screen Hayven had a moderately positive depression screening. Depression is commonly associated with obesity and often results in emotional eating behaviors. We will monitor this closely and work on CBT to help improve the non-hunger eating patterns. Referral to Psychology may be required if no improvement is seen as she continues in our clinic.  Obesity Lillianah is currently in the action stage of change and her goal is to continue with weight loss efforts. I recommend Nolie begin the structured treatment plan as follows:  She has agreed to follow the Category 1 plan +100 calories as needed Shonique has been instructed to eventually work up to a goal of 150 minutes of combined cardio and strengthening exercise per week for weight loss and overall health benefits. We discussed the following Behavioral Modification Strategies today: increasing lean protein intake, decreasing simple carbohydrates  and work on meal planning and easy cooking plans   She was informed of the importance of frequent follow up visits to maximize her success with intensive lifestyle modifications for  her multiple health conditions. She was informed we would discuss her lab results at her next visit unless there is a critical issue that needs to be addressed sooner. Hawley agreed to keep her next visit at the agreed upon time to discuss these results.    OBESITY BEHAVIORAL INTERVENTION VISIT  Today's visit was # 1  out of 22.  Starting weight: 281 lbs Starting date: 02/06/18 Today's weight : 281 lbs  Today's date: 02/06/2018 Total lbs lost to date: 0 (Patients must lose 7 lbs in the first 6 months to continue with counseling)   ASK: We discussed the diagnosis of obesity with Tally Due today and Clydette agreed to give [name] permission to discuss obesity behavioral modification therapy today.  ASSESS: Kimeka has the diagnosis of obesity and her BMI today is 45.38 Orelia is in the action stage of change   ADVISE: Jonika was educated on the multiple health risks of obesity as well as the benefit of weight loss to improve her health. She was advised of the need for long term treatment and the importance of lifestyle modifications.  AGREE: Multiple dietary modification options and treatment options were discussed and  Jaelen agreed to the above obesity treatment plan.   I, Doreene Nest, am acting as transcriptionist for  Dennard Nip, MD  I have reviewed the above documentation for accuracy and completeness, and I agree with the above. -Dennard Nip, MD

## 2018-02-07 LAB — CBC WITH DIFFERENTIAL
BASOS ABS: 0.1 10*3/uL (ref 0.0–0.2)
Basos: 1 %
EOS (ABSOLUTE): 0.2 10*3/uL (ref 0.0–0.4)
Eos: 4 %
HEMOGLOBIN: 14.1 g/dL (ref 11.1–15.9)
Hematocrit: 43.7 % (ref 34.0–46.6)
IMMATURE GRANULOCYTES: 0 %
Immature Grans (Abs): 0 10*3/uL (ref 0.0–0.1)
Lymphocytes Absolute: 1.3 10*3/uL (ref 0.7–3.1)
Lymphs: 26 %
MCH: 28.1 pg (ref 26.6–33.0)
MCHC: 32.3 g/dL (ref 31.5–35.7)
MCV: 87 fL (ref 79–97)
MONOS ABS: 0.5 10*3/uL (ref 0.1–0.9)
Monocytes: 9 %
Neutrophils Absolute: 3.1 10*3/uL (ref 1.4–7.0)
Neutrophils: 60 %
RBC: 5.01 x10E6/uL (ref 3.77–5.28)
RDW: 14.3 % (ref 12.3–15.4)
WBC: 5.2 10*3/uL (ref 3.4–10.8)

## 2018-02-07 LAB — COMPREHENSIVE METABOLIC PANEL
ALBUMIN: 4.5 g/dL (ref 3.6–4.8)
ALT: 17 IU/L (ref 0–32)
AST: 14 IU/L (ref 0–40)
Albumin/Globulin Ratio: 2 (ref 1.2–2.2)
Alkaline Phosphatase: 111 IU/L (ref 39–117)
BUN / CREAT RATIO: 24 (ref 12–28)
BUN: 19 mg/dL (ref 8–27)
Bilirubin Total: 0.4 mg/dL (ref 0.0–1.2)
CO2: 24 mmol/L (ref 20–29)
CREATININE: 0.78 mg/dL (ref 0.57–1.00)
Calcium: 9.4 mg/dL (ref 8.7–10.3)
Chloride: 101 mmol/L (ref 96–106)
GFR, EST AFRICAN AMERICAN: 95 mL/min/{1.73_m2} (ref >59)
GFR, EST NON AFRICAN AMERICAN: 82 mL/min/{1.73_m2} (ref >59)
GLUCOSE: 99 mg/dL (ref 65–99)
Globulin, Total: 2.3 g/dL (ref 1.5–4.5)
Potassium: 4.8 mmol/L (ref 3.5–5.2)
Sodium: 139 mmol/L (ref 134–144)
TOTAL PROTEIN: 6.8 g/dL (ref 6.0–8.5)

## 2018-02-07 LAB — LIPID PANEL WITH LDL/HDL RATIO
CHOLESTEROL TOTAL: 218 mg/dL — AB (ref 100–199)
HDL: 65 mg/dL (ref >39)
LDL CALC: 131 mg/dL — AB (ref 0–99)
LDl/HDL Ratio: 2 ratio (ref 0.0–3.2)
TRIGLYCERIDES: 108 mg/dL (ref 0–149)
VLDL CHOLESTEROL CAL: 22 mg/dL (ref 5–40)

## 2018-02-07 LAB — INSULIN, RANDOM: INSULIN: 8.7 u[IU]/mL (ref 2.6–24.9)

## 2018-02-07 LAB — T3: T3, Total: 132 ng/dL (ref 71–180)

## 2018-02-07 LAB — VITAMIN D 25 HYDROXY (VIT D DEFICIENCY, FRACTURES): Vit D, 25-Hydroxy: 41.5 ng/mL (ref 30.0–100.0)

## 2018-02-07 LAB — HEMOGLOBIN A1C
Est. average glucose Bld gHb Est-mCnc: 120 mg/dL
Hgb A1c MFr Bld: 5.8 % — ABNORMAL HIGH (ref 4.8–5.6)

## 2018-02-07 LAB — TSH: TSH: 5.1 u[IU]/mL — AB (ref 0.450–4.500)

## 2018-02-07 LAB — T4, FREE: FREE T4: 1.06 ng/dL (ref 0.82–1.77)

## 2018-02-07 LAB — FOLATE: Folate: 5.9 ng/mL (ref >3.0)

## 2018-02-07 LAB — VITAMIN B12: VITAMIN B 12: 340 pg/mL (ref 232–1245)

## 2018-02-09 ENCOUNTER — Encounter (INDEPENDENT_AMBULATORY_CARE_PROVIDER_SITE_OTHER): Payer: Self-pay | Admitting: Family Medicine

## 2018-02-20 ENCOUNTER — Other Ambulatory Visit (INDEPENDENT_AMBULATORY_CARE_PROVIDER_SITE_OTHER): Payer: Self-pay | Admitting: Family Medicine

## 2018-02-20 ENCOUNTER — Ambulatory Visit (INDEPENDENT_AMBULATORY_CARE_PROVIDER_SITE_OTHER): Payer: Federal, State, Local not specified - PPO | Admitting: Family Medicine

## 2018-02-20 VITALS — BP 144/82 | HR 84 | Temp 97.4°F | Ht 66.0 in | Wt 271.0 lb

## 2018-02-20 DIAGNOSIS — Z6841 Body Mass Index (BMI) 40.0 and over, adult: Secondary | ICD-10-CM

## 2018-02-20 DIAGNOSIS — R7303 Prediabetes: Secondary | ICD-10-CM

## 2018-02-20 DIAGNOSIS — Z9189 Other specified personal risk factors, not elsewhere classified: Secondary | ICD-10-CM

## 2018-02-20 DIAGNOSIS — E7849 Other hyperlipidemia: Secondary | ICD-10-CM

## 2018-02-20 MED ORDER — METFORMIN HCL 500 MG PO TABS: 500.0000 mg | ORAL_TABLET | Freq: Every day | ORAL | 0 refills | Status: DC

## 2018-02-20 NOTE — Progress Notes (Signed)
Office: [phone]  /  Fax: [phone]   HPI:   Chief Complaint: OBESITY Cheryl [name] is here to discuss her progress with her obesity treatment plan. She is on the Category 1 plan + 100 calories and is following her eating plan approximately 80-85 % of the time. She states she is exercising 0 minutes 0 times per week. Cheryl [name] is missing eating out. Hard to eat all food at once. She did very well with weight loss but had some episodes of polyphagia.  Her weight is 271 lb (122.9 kg) today and has had a weight loss of 10 pounds over a period of 2 weeks since her last visit. She has lost 10 lbs since starting treatment with [name].  Pre-Diabetes Cheryl [name] has a diagnosis of pre-diabetes based on her elevated Hgb A1c and was informed this puts her at greater risk of developing diabetes. Her A1c is elevated at 5.8, she notes polyphagia. She is not taking metformin currently and continues to work on diet and exercise to decrease risk of diabetes. She denies nausea or hypoglycemia.  At risk for diabetes Cheryl [name] is at higher than average risk for developing diabetes [name] to her obesity and pre-diabetes. She currently denies polyuria or polydipsia.  Hyperlipidemia Cheryl [name] has hyperlipidemia and LDL is elevated but HDL and triglycerides are within normal limits. She is attempting to improve her cholesterol levels with intensive lifestyle modification including a low saturated fat diet, exercise and weight loss. She is not on statin. She denies any chest pain, claudication or myalgias.  ALLERGIES: Allergies  Allergen Reactions  . Ace Inhibitors Cough  . Lisinopril Cough  . Neosporin [Neomycin-Bacitracin Zn-Polymyx] Rash  . Z-Pak [Azithromycin] Other (See Comments)    thrush    MEDICATIONS: Current Outpatient Medications on File Prior to Visit  Medication Sig Dispense Refill  . aspirin 81 MG chewable tablet Chew 81 mg by mouth daily.    . Cholecalciferol (VITAMIN D3) 1000 UNITS CAPS Take 1 capsule by  mouth daily.     . Loratadine (CLARITIN PO) Take 1 tablet by mouth daily.     . meloxicam (MOBIC) 15 MG tablet Take 1 tablet by mouth at bedtime.  3  . nitroGLYCERIN (NITROSTAT) 0.4 MG SL tablet Place 1 tablet (0.4 mg total) under the tongue every 5 (five) minutes as needed for chest pain. 12 tablet 0  . UNABLE TO FIND C PAP for sleep apnea     . venlafaxine (EFFEXOR) 75 MG tablet Take 1 tablet (75 mg total) by mouth daily. 90 tablet 4   Current Facility-Administered Medications on File Prior to Visit  Medication Dose Route Frequency Provider Last Rate Last Dose  . 0.9 %  sodium chloride infusion  500 mL Intravenous Once Armbruster, Carlota Raspberry, MD        PAST MEDICAL HISTORY: Past Medical History:  Diagnosis Date  . Allergy   . Arthritis   . Chest pain   . Depression   . Fatigue   . GERD (gastroesophageal reflux disease)   . Hyperlipidemia   . Hypertension   . Joint pain   . Obesity   . Osteopenia   . Palpitation   . Sleep apnea    uses CPAP   . SOB (shortness of breath)   . Urinary incontinence     PAST SURGICAL HISTORY: Past Surgical History:  Procedure Laterality Date  . ABDOMINAL HYSTERECTOMY  08/10/2000   TAH/RSO  . BREAST SURGERY     Biopsy-Benign  . COLOSTOMY  2000  Dr.Patterson  . KNEE ARTHROSCOPY     left knee  . LAPAROSCOPIC CHOLECYSTECTOMY  2010  . LAPAROTOMY  1990   Fibroid removed-ovarian cyst   . RHINOPLASTY      SOCIAL HISTORY: Social History   Tobacco Use  . Smoking status: Former Smoker    Last attempt to quit: 08/15/2007    Years since quitting: 10.5  . Smokeless tobacco: Never Used  Substance Use Topics  . Alcohol use: Yes    Alcohol/week: 1.8 oz    Types: 3 Standard drinks or equivalent per week    Comment: 3 drinks per week per pt   . Drug use: No    FAMILY HISTORY: Family History  Problem Relation Age of Onset  . Breast cancer Mother        Age 21  . Hypertension Mother   . Depression Mother   . Hypertension Father   .  Arthritis Father   . Heart disease Father   . Cancer Father        Spleen  . Sleep apnea Father   . Heart disease Maternal Grandfather   . Colon cancer Paternal Grandfather        Colon cancer  . Esophageal cancer Neg Hx   . Stomach cancer Neg Hx     ROS: Review of Systems  Constitutional: Positive for weight loss.  Cardiovascular: Negative for chest pain and claudication.  Gastrointestinal: Negative for nausea.  Genitourinary: Negative for frequency.  Musculoskeletal: Negative for myalgias.  Endo/Heme/Allergies: Negative for polydipsia.       Positive polyphagia Negative hypoglycemia    PHYSICAL EXAM: Blood pressure (!) 144/82, pulse 84, temperature (!) 97.4 F (36.3 C), temperature source Oral, height 5\' 6"  (1.676 m), weight 271 lb (122.9 kg), SpO2 97 %. Body mass index is 43.74 kg/m. Physical Exam  Constitutional: She is oriented to person, place, and time. She appears well-developed and well-nourished.  Cardiovascular: Normal rate.  Pulmonary/Chest: Effort normal.  Musculoskeletal: Normal range of motion.  Neurological: She is oriented to person, place, and time.  Skin: Skin is warm and dry.  Psychiatric: She has a normal mood and affect. Her behavior is normal.  Vitals reviewed.   RECENT LABS AND TESTS: BMET    Component Value Date/Time   NA 139 02/06/2018 0941   K 4.8 02/06/2018 0941   CL 101 02/06/2018 0941   CO2 24 02/06/2018 0941   GLUCOSE 99 02/06/2018 0941   GLUCOSE 95 06/08/2016 1353   BUN 19 02/06/2018 0941   CREATININE 0.78 02/06/2018 0941   CREATININE 0.81 06/08/2016 1353   CALCIUM 9.4 02/06/2018 0941   GFRNONAA 82 02/06/2018 0941   GFRAA 95 02/06/2018 0941   Lab Results  Component Value Date   HGBA1C 5.8 (H) 02/06/2018   Lab Results  Component Value Date   INSULIN 8.7 02/06/2018   CBC    Component Value Date/Time   WBC 5.2 02/06/2018 0941   WBC 5.0 06/08/2016 1353   RBC 5.01 02/06/2018 0941   RBC 4.94 06/08/2016 1353   HGB 14.1  02/06/2018 0941   HGB 14.3 04/02/2014 0937   HCT 43.7 02/06/2018 0941   PLT 263 06/08/2016 1353   MCV 87 02/06/2018 0941   MCH 28.1 02/06/2018 0941   MCH 27.5 06/08/2016 1353   MCHC 32.3 02/06/2018 0941   MCHC 31.2 (L) 06/08/2016 1353   RDW 14.3 02/06/2018 0941   LYMPHSABS 1.3 02/06/2018 0941   MONOABS 450 06/08/2016 1353   EOSABS 0.2 02/06/2018 0941  BASOSABS 0.1 02/06/2018 0941   Iron/TIBC/Ferritin/ %Sat No results found for: IRON, TIBC, FERRITIN, IRONPCTSAT Lipid Panel     Component Value Date/Time   CHOL 218 (H) 02/06/2018 0941   TRIG 108 02/06/2018 0941   HDL 65 02/06/2018 0941   CHOLHDL 3.1 09/27/2017 1404   CHOLHDL 3.4 06/08/2016 1353   VLDL 19 06/08/2016 1353   LDLCALC 131 (H) 02/06/2018 0941   Hepatic Function Panel     Component Value Date/Time   PROT 6.8 02/06/2018 0941   ALBUMIN 4.5 02/06/2018 0941   AST 14 02/06/2018 0941   ALT 17 02/06/2018 0941   ALKPHOS 111 02/06/2018 0941   BILITOT 0.4 02/06/2018 0941      Component Value Date/Time   TSH 5.100 (H) 02/06/2018 0941   TSH 3.450 09/27/2017 1404   TSH 2.81 06/08/2016 1353    ASSESSMENT AND PLAN: Prediabetes - Plan: metFORMIN (GLUCOPHAGE) 500 MG tablet  Other hyperlipidemia  At risk for diabetes mellitus  Class 3 severe obesity with serious comorbidity and body mass index (BMI) of 40.0 to 44.9 in adult, unspecified obesity type ([place])  PLAN:  Pre-Diabetes Cheryl [name] will continue to work on weight loss, exercise, and decreasing simple carbohydrates in her diet to help decrease the risk of diabetes. We dicussed metformin including benefits and risks. She was informed that eating too many simple carbohydrates or too many calories at one sitting increases the likelihood of GI side effects. Cheryl [name] agrees to start metformin 500 mg q AM #30 with no refills. We will recheck labs in 3 months and Cheryl [name] agrees to follow up with our clinic in 2 to 3 weeks as directed to monitor her progress.  Diabetes risk  counselling Cheryl [name] was given extended (15 minutes) diabetes prevention counseling today. She is 62 y.o. female and has risk factors for diabetes including obesity and pre-diabetes. We discussed intensive lifestyle modifications today with an emphasis on weight loss as well as increasing exercise and decreasing simple carbohydrates in her diet.  Hyperlipidemia Cheryl [name] was informed of the American Heart Association Guidelines emphasizing intensive lifestyle modifications as the first line treatment for hyperlipidemia. We discussed many lifestyle modifications today in depth, and Cheryl [name] will continue to work on decreasing saturated fats such as fatty red meat, butter and many fried foods. She will also increase vegetables and lean protein in her diet and continue to work on diet, exercise, and weight loss efforts. We will recheck labs in 3 months and Cheryl [name] agrees to follow up with our clinic in 2 to 3 weeks.  Obesity Cheryl [name] is currently in the action stage of change. As such, her goal is to continue with weight loss efforts She has agreed to follow the Category 1 plan + 100 calories Cheryl [name] has been instructed to work up to a goal of 150 minutes of combined cardio and strengthening exercise per week for weight loss and overall health benefits. We discussed the following Behavioral Modification Strategies today: increasing lean protein intake, decreasing simple carbohydrates  and work on meal planning and easy cooking plans   Cheryl [name] has agreed to follow up with our clinic in 2 to 3 weeks. She was informed of the importance of frequent follow up visits to maximize her success with intensive lifestyle modifications for her multiple health conditions.   OBESITY BEHAVIORAL INTERVENTION VISIT  Today's visit was # 2 out of 22.  Starting weight: 281 lbs Starting date: 02/06/18 Today's weight : 271 lbs  Today's date: 02/20/2018 Total lbs lost to date: 10 (Patients  must lose 7 lbs in the first 6 months to  continue with counseling)   ASK: We discussed the diagnosis of obesity with Cheryl [name] today and Cheryl [name] agreed to give [name] permission to discuss obesity behavioral modification therapy today.  ASSESS: Cheryl [name] has the diagnosis of obesity and her BMI today is 43.76 Cheryl [name] is in the action stage of change   ADVISE: Cheryl [name] was educated on the multiple health risks of obesity as well as the benefit of weight loss to improve her health. She was advised of the need for long term treatment and the importance of lifestyle modifications.  AGREE: Multiple dietary modification options and treatment options were discussed and  Cheryl [name] agreed to the above obesity treatment plan.  I, Trixie Dredge, am acting as transcriptionist for Dennard Nip, MD  I have reviewed the above documentation for accuracy and completeness, and I agree with the above. -Dennard Nip, MD

## 2018-03-07 ENCOUNTER — Ambulatory Visit (INDEPENDENT_AMBULATORY_CARE_PROVIDER_SITE_OTHER): Payer: Federal, State, Local not specified - PPO | Admitting: Family Medicine

## 2018-03-07 VITALS — BP 157/79 | HR 69 | Ht 66.0 in | Wt 269.0 lb

## 2018-03-07 DIAGNOSIS — Z6841 Body Mass Index (BMI) 40.0 and over, adult: Secondary | ICD-10-CM

## 2018-03-07 DIAGNOSIS — Z9189 Other specified personal risk factors, not elsewhere classified: Secondary | ICD-10-CM

## 2018-03-07 DIAGNOSIS — R7303 Prediabetes: Secondary | ICD-10-CM

## 2018-03-07 DIAGNOSIS — K5909 Other constipation: Secondary | ICD-10-CM | POA: Diagnosis not present

## 2018-03-07 MED ORDER — METFORMIN HCL 500 MG PO TABS
500.0000 mg | ORAL_TABLET | Freq: Two times a day (BID) | ORAL | 0 refills | Status: DC
Start: 2018-03-07 — End: 2018-04-09

## 2018-03-11 NOTE — Progress Notes (Signed)
Office: [phone]  /  Fax: [phone]   HPI:   Chief Complaint: OBESITY Cheryl [name] is here to discuss her progress with her obesity treatment plan. She is on the Category 1 plan +100 calories and is following her eating plan approximately 80 % of the time. She states she is exercising 0 minutes 0 times per week. Cheryl [name] continues to do well with weight loss on the category plan. She notes polyphagia in the night time and she is still getting bored with her breakfast options. Her weight is 269 lb (122 kg) today and has had a weight loss of 2 pounds over a period of 2 weeks since her last visit. She has lost 12 lbs since starting treatment with [name].  Pre-Diabetes Cheryl [name] has a diagnosis of prediabetes based on her elevated Hgb A1c and was informed this puts her at greater risk of developing diabetes. She tolerated metformin well, but she still notes night time polyphagia. Cheryl [name] continues to work on diet and exercise to decrease risk of diabetes. She denies nausea, vomiting or hypoglycemia.  At risk for diabetes Cheryl [name] is at higher than average risk for developing diabetes [name] to her obesity and pre-diabetes. She currently denies polyuria or polydipsia.  Constipation Cheryl [name] notes constipation for the last few weeks, worse since attempting weight loss. She states BM are less frequent and are hard but not painful. She denies cramping or bleeding.  ALLERGIES: Allergies  Allergen Reactions  . Ace Inhibitors Cough  . Lisinopril Cough  . Neosporin [Neomycin-Bacitracin Zn-Polymyx] Rash  . Z-Pak [Azithromycin] Other (See Comments)    thrush    MEDICATIONS: Current Outpatient Medications on File Prior to Visit  Medication Sig Dispense Refill  . aspirin 81 MG chewable tablet Chew 81 mg by mouth daily.    . Cholecalciferol (VITAMIN D3) 1000 UNITS CAPS Take 1 capsule by mouth daily.     . Loratadine (CLARITIN PO) Take 1 tablet by mouth daily.     . meloxicam (MOBIC) 15 MG tablet Take 1 tablet  by mouth at bedtime.  3  . nitroGLYCERIN (NITROSTAT) 0.4 MG SL tablet Place 1 tablet (0.4 mg total) under the tongue every 5 (five) minutes as needed for chest pain. 12 tablet 0  . UNABLE TO FIND C PAP for sleep apnea     . venlafaxine (EFFEXOR) 75 MG tablet Take 1 tablet (75 mg total) by mouth daily. 90 tablet 4   Current Facility-Administered Medications on File Prior to Visit  Medication Dose Route Frequency Provider Last Rate Last Dose  . 0.9 %  sodium chloride infusion  500 mL Intravenous Once Armbruster, Carlota Raspberry, MD        PAST MEDICAL HISTORY: Past Medical History:  Diagnosis Date  . Allergy   . Arthritis   . Chest pain   . Depression   . Fatigue   . GERD (gastroesophageal reflux disease)   . Hyperlipidemia   . Hypertension   . Joint pain   . Obesity   . Osteopenia   . Palpitation   . Sleep apnea    uses CPAP   . SOB (shortness of breath)   . Urinary incontinence     PAST SURGICAL HISTORY: Past Surgical History:  Procedure Laterality Date  . ABDOMINAL HYSTERECTOMY  08/10/2000   TAH/RSO  . BREAST SURGERY     Biopsy-Benign  . COLOSTOMY  2000   Dr.Patterson  . KNEE ARTHROSCOPY     left knee  . LAPAROSCOPIC CHOLECYSTECTOMY  2010  . LAPAROTOMY  1990   Fibroid removed-ovarian cyst   . RHINOPLASTY      SOCIAL HISTORY: Social History   Tobacco Use  . Smoking status: Former Smoker    Last attempt to quit: 08/15/2007    Years since quitting: 10.5  . Smokeless tobacco: Never Used  Substance Use Topics  . Alcohol use: Yes    Alcohol/week: 1.8 oz    Types: 3 Standard drinks or equivalent per week    Comment: 3 drinks per week per pt   . Drug use: No    FAMILY HISTORY: Family History  Problem Relation Age of Onset  . Breast cancer Mother        Age 16  . Hypertension Mother   . Depression Mother   . Hypertension Father   . Arthritis Father   . Heart disease Father   . Cancer Father        Spleen  . Sleep apnea Father   . Heart disease Maternal  Grandfather   . Colon cancer Paternal Grandfather        Colon cancer  . Esophageal cancer Neg Hx   . Stomach cancer Neg Hx     ROS: Review of Systems  Constitutional: Positive for weight loss.  Gastrointestinal: Negative for nausea and vomiting.       Negative for abdominal cramping Negative for rectal bleeding  Genitourinary: Negative for frequency.  Endo/Heme/Allergies: Negative for polydipsia.       Negative for hypoglycemia Positive for polyphagia    PHYSICAL EXAM: Blood pressure (!) 157/79, pulse 69, height 5\' 6"  (1.676 m), weight 269 lb (122 kg), SpO2 97 %. Body mass index is 43.42 kg/m. Physical Exam  Constitutional: She is oriented to person, place, and time. She appears well-developed and well-nourished.  Cardiovascular: Normal rate.  Pulmonary/Chest: Effort normal.  Musculoskeletal: Normal range of motion.  Neurological: She is oriented to person, place, and time.  Skin: Skin is warm and dry.  Psychiatric: She has a normal mood and affect. Her behavior is normal.  Vitals reviewed.   RECENT LABS AND TESTS: BMET    Component Value Date/Time   NA 139 02/06/2018 0941   K 4.8 02/06/2018 0941   CL 101 02/06/2018 0941   CO2 24 02/06/2018 0941   GLUCOSE 99 02/06/2018 0941   GLUCOSE 95 06/08/2016 1353   BUN 19 02/06/2018 0941   CREATININE 0.78 02/06/2018 0941   CREATININE 0.81 06/08/2016 1353   CALCIUM 9.4 02/06/2018 0941   GFRNONAA 82 02/06/2018 0941   GFRAA 95 02/06/2018 0941   Lab Results  Component Value Date   HGBA1C 5.8 (H) 02/06/2018   Lab Results  Component Value Date   INSULIN 8.7 02/06/2018   CBC    Component Value Date/Time   WBC 5.2 02/06/2018 0941   WBC 5.0 06/08/2016 1353   RBC 5.01 02/06/2018 0941   RBC 4.94 06/08/2016 1353   HGB 14.1 02/06/2018 0941   HGB 14.3 04/02/2014 0937   HCT 43.7 02/06/2018 0941   PLT 263 06/08/2016 1353   MCV 87 02/06/2018 0941   MCH 28.1 02/06/2018 0941   MCH 27.5 06/08/2016 1353   MCHC 32.3  02/06/2018 0941   MCHC 31.2 (L) 06/08/2016 1353   RDW 14.3 02/06/2018 0941   LYMPHSABS 1.3 02/06/2018 0941   MONOABS 450 06/08/2016 1353   EOSABS 0.2 02/06/2018 0941   BASOSABS 0.1 02/06/2018 0941   Iron/TIBC/Ferritin/ %Sat No results found for: IRON, TIBC, FERRITIN, IRONPCTSAT Lipid Panel     Component  Value Date/Time   CHOL 218 (H) 02/06/2018 0941   TRIG 108 02/06/2018 0941   HDL 65 02/06/2018 0941   CHOLHDL 3.1 09/27/2017 1404   CHOLHDL 3.4 06/08/2016 1353   VLDL 19 06/08/2016 1353   LDLCALC 131 (H) 02/06/2018 0941   Hepatic Function Panel     Component Value Date/Time   PROT 6.8 02/06/2018 0941   ALBUMIN 4.5 02/06/2018 0941   AST 14 02/06/2018 0941   ALT 17 02/06/2018 0941   ALKPHOS 111 02/06/2018 0941   BILITOT 0.4 02/06/2018 0941      Component Value Date/Time   TSH 5.100 (H) 02/06/2018 0941   TSH 3.450 09/27/2017 1404   TSH 2.81 06/08/2016 1353   Results for EVETTE, DICLEMENTE J "[name]" (MRN [id number]) as of 03/11/2018 15:44  Ref. Range 02/06/2018 09:41  Vitamin D, 25-Hydroxy Latest Ref Range: 30.0 - 100.0 ng/mL 41.5   ASSESSMENT AND PLAN: Other constipation  Prediabetes - Plan: metFORMIN (GLUCOPHAGE) 500 MG tablet  At risk for diabetes mellitus  Class 3 severe obesity with serious comorbidity and body mass index (BMI) of 40.0 to 44.9 in adult, unspecified obesity type [facility])  PLAN:  Pre-Diabetes Cheryl [name] will continue to work on weight loss, exercise, and decreasing simple carbohydrates in her diet to help decrease the risk of diabetes. We dicussed metformin including benefits and risks. She was informed that eating too many simple carbohydrates or too many calories at one sitting increases the likelihood of GI side effects. Cheryl [name] agrees to increase  metformin to 500 mg BID #60 with no refills and follow up with [name] as directed to monitor her progress.  Diabetes risk counseling Cheryl [name] was given extended (15 minutes) diabetes prevention counseling today. She is 62  y.o. female and has risk factors for diabetes including obesity and pre-diabetes. We discussed intensive lifestyle modifications today with an emphasis on weight loss as well as increasing exercise and decreasing simple carbohydrates in her diet.  Constipation Cheryl [name] was informed decrease bowel movement frequency is normal while losing weight, but stools should not be hard or painful. She was advised to increase her H20 intake and work on increasing her fiber intake. High fiber foods were discussed today. This may improve with the increase in Metformin. We will follow up in 2 weeks to see if any improvement.  Obesity Cheryl [name] is currently in the action stage of change. As such, her goal is to continue with weight loss efforts She has agreed to follow the Category 1 plan plus breakfast options Cheryl [name] has been instructed to work up to a goal of 150 minutes of combined cardio and strengthening exercise per week for weight loss and overall health benefits. We discussed the following Behavioral Modification Strategies today: increase H2O intake, increasing lean protein intake, decreasing simple carbohydrates , increasing fiber rich foods and work on meal planning and easy cooking plans  Cheryl [name] has agreed to follow up with our clinic in 2 to 3 weeks. She was informed of the importance of frequent follow up visits to maximize her success with intensive lifestyle modifications for her multiple health conditions.   OBESITY BEHAVIORAL INTERVENTION VISIT  Today's visit was # 3 out of 22.  Starting weight: 281 lbs Starting date: 02/06/18 Today's weight : @269  lbs Today's date: 03/07/2018 Total lbs lost to date: 12    ASK: We discussed the diagnosis of obesity with Cheryl [name] today and Cheryl [name] agreed to give [name] permission to discuss obesity behavioral modification therapy today.  ASSESS: Cheryl [name] has the  diagnosis of obesity and her BMI today is 43.44 Cheryl [name] is in the action stage of change    ADVISE: Cheryl [name] was educated on the multiple health risks of obesity as well as the benefit of weight loss to improve her health. She was advised of the need for long term treatment and the importance of lifestyle modifications.  AGREE: Multiple dietary modification options and treatment options were discussed and  Cheryl [name] agreed to the above obesity treatment plan.  I, Doreene Nest, am acting as transcriptionist for Dennard Nip, MD  I have reviewed the above documentation for accuracy and completeness, and I agree with the above. -Dennard Nip, MD

## 2018-03-28 ENCOUNTER — Ambulatory Visit (INDEPENDENT_AMBULATORY_CARE_PROVIDER_SITE_OTHER): Payer: Federal, State, Local not specified - PPO | Admitting: Family Medicine

## 2018-03-28 VITALS — BP 131/81 | HR 91 | Temp 97.9°F | Ht 66.0 in | Wt 265.0 lb

## 2018-03-28 DIAGNOSIS — R7303 Prediabetes: Secondary | ICD-10-CM | POA: Diagnosis not present

## 2018-03-28 DIAGNOSIS — Z6841 Body Mass Index (BMI) 40.0 and over, adult: Secondary | ICD-10-CM | POA: Diagnosis not present

## 2018-03-28 NOTE — Progress Notes (Signed)
Office: [phone]  /  Fax: [phone]   HPI:   Chief Complaint: OBESITY Nijah is here to discuss her progress with her obesity treatment plan. She is on the Category 1 plan plus breakfast options and is following her eating plan approximately 60 % of the time. She states she is exercising 0 minutes 0 times per week. Lindyn continues to do well with weight loss on her Category 1 plan. She had some celebration eating for her birthday but is back on track.  Her weight is 265 lb (120.2 kg) today and has had a weight loss of 4 pounds over a period of 3 weeks since her last visit. She has lost 16 lbs since starting treatment with [name].  Pre-Diabetes Janel has a diagnosis of pre-diabetes based on her elevated Hgb A1c and was informed this puts her at greater risk of developing diabetes. She is stable on metformin and continues to work on diet and exercise to decrease risk of diabetes. She denies nausea, vomiting, or hypoglycemia.  ALLERGIES: Allergies  Allergen Reactions  . Ace Inhibitors Cough  . Lisinopril Cough  . Neosporin [Neomycin-Bacitracin Zn-Polymyx] Rash  . Z-Pak [Azithromycin] Other (See Comments)    thrush    MEDICATIONS: Current Outpatient Medications on File Prior to Visit  Medication Sig Dispense Refill  . aspirin 81 MG chewable tablet Chew 81 mg by mouth daily.    . Cholecalciferol (VITAMIN D3) 1000 UNITS CAPS Take 1 capsule by mouth daily.     . Loratadine (CLARITIN PO) Take 1 tablet by mouth daily.     . meloxicam (MOBIC) 15 MG tablet Take 1 tablet by mouth at bedtime.  3  . metFORMIN (GLUCOPHAGE) 500 MG tablet Take 1 tablet (500 mg total) by mouth 2 (two) times daily with a meal. 60 tablet 0  . nitroGLYCERIN (NITROSTAT) 0.4 MG SL tablet Place 1 tablet (0.4 mg total) under the tongue every 5 (five) minutes as needed for chest pain. 12 tablet 0  . UNABLE TO FIND C PAP for sleep apnea     . venlafaxine (EFFEXOR) 75 MG tablet Take 1 tablet (75 mg total) by mouth  daily. 90 tablet 4   Current Facility-Administered Medications on File Prior to Visit  Medication Dose Route Frequency Provider Last Rate Last Dose  . 0.9 %  sodium chloride infusion  500 mL Intravenous Once Armbruster, Carlota Raspberry, MD        PAST MEDICAL HISTORY: Past Medical History:  Diagnosis Date  . Allergy   . Arthritis   . Chest pain   . Depression   . Fatigue   . GERD (gastroesophageal reflux disease)   . Hyperlipidemia   . Hypertension   . Joint pain   . Obesity   . Osteopenia   . Palpitation   . Sleep apnea    uses CPAP   . SOB (shortness of breath)   . Urinary incontinence     PAST SURGICAL HISTORY: Past Surgical History:  Procedure Laterality Date  . ABDOMINAL HYSTERECTOMY  08/10/2000   TAH/RSO  . BREAST SURGERY     Biopsy-Benign  . COLOSTOMY  2000   Dr.Patterson  . KNEE ARTHROSCOPY     left knee  . LAPAROSCOPIC CHOLECYSTECTOMY  2010  . LAPAROTOMY  1990   Fibroid removed-ovarian cyst   . RHINOPLASTY      SOCIAL HISTORY: Social History   Tobacco Use  . Smoking status: Former Smoker    Last attempt to quit: 08/15/2007  Years since quitting: 10.6  . Smokeless tobacco: Never Used  Substance Use Topics  . Alcohol use: Yes    Alcohol/week: 3.0 standard drinks    Types: 3 Standard drinks or equivalent per week    Comment: 3 drinks per week per pt   . Drug use: No    FAMILY HISTORY: Family History  Problem Relation Age of Onset  . Breast cancer Mother        Age 28  . Hypertension Mother   . Depression Mother   . Hypertension Father   . Arthritis Father   . Heart disease Father   . Cancer Father        Spleen  . Sleep apnea Father   . Heart disease Maternal Grandfather   . Colon cancer Paternal Grandfather        Colon cancer  . Esophageal cancer Neg Hx   . Stomach cancer Neg Hx     ROS: Review of Systems  Constitutional: Positive for weight loss.  Gastrointestinal: Negative for nausea and vomiting.  Endo/Heme/Allergies:        Negative hypoglycemia    PHYSICAL EXAM: Blood pressure 131/81, pulse 91, temperature 97.9 F (36.6 C), temperature source Oral, height 5\' 6"  (1.676 m), weight 265 lb (120.2 kg), SpO2 95 %. Body mass index is 42.77 kg/m. Physical Exam  Constitutional: She is oriented to person, place, and time. She appears well-developed and well-nourished.  Cardiovascular: Normal rate.  Pulmonary/Chest: Effort normal.  Musculoskeletal: Normal range of motion.  Neurological: She is oriented to person, place, and time.  Skin: Skin is warm and dry.  Psychiatric: She has a normal mood and affect. Her behavior is normal.  Vitals reviewed.   RECENT LABS AND TESTS: BMET    Component Value Date/Time   NA 139 02/06/2018 0941   K 4.8 02/06/2018 0941   CL 101 02/06/2018 0941   CO2 24 02/06/2018 0941   GLUCOSE 99 02/06/2018 0941   GLUCOSE 95 06/08/2016 1353   BUN 19 02/06/2018 0941   CREATININE 0.78 02/06/2018 0941   CREATININE 0.81 06/08/2016 1353   CALCIUM 9.4 02/06/2018 0941   GFRNONAA 82 02/06/2018 0941   GFRAA 95 02/06/2018 0941   Lab Results  Component Value Date   HGBA1C 5.8 (H) 02/06/2018   Lab Results  Component Value Date   INSULIN 8.7 02/06/2018   CBC    Component Value Date/Time   WBC 5.2 02/06/2018 0941   WBC 5.0 06/08/2016 1353   RBC 5.01 02/06/2018 0941   RBC 4.94 06/08/2016 1353   HGB 14.1 02/06/2018 0941   HGB 14.3 04/02/2014 0937   HCT 43.7 02/06/2018 0941   PLT 263 06/08/2016 1353   MCV 87 02/06/2018 0941   MCH 28.1 02/06/2018 0941   MCH 27.5 06/08/2016 1353   MCHC 32.3 02/06/2018 0941   MCHC 31.2 (L) 06/08/2016 1353   RDW 14.3 02/06/2018 0941   LYMPHSABS 1.3 02/06/2018 0941   MONOABS 450 06/08/2016 1353   EOSABS 0.2 02/06/2018 0941   BASOSABS 0.1 02/06/2018 0941   Iron/TIBC/Ferritin/ %Sat No results found for: IRON, TIBC, FERRITIN, IRONPCTSAT Lipid Panel     Component Value Date/Time   CHOL 218 (H) 02/06/2018 0941   TRIG 108 02/06/2018 0941   HDL 65  02/06/2018 0941   CHOLHDL 3.1 09/27/2017 1404   CHOLHDL 3.4 06/08/2016 1353   VLDL 19 06/08/2016 1353   LDLCALC 131 (H) 02/06/2018 0941   Hepatic Function Panel     Component Value Date/Time  PROT 6.8 02/06/2018 0941   ALBUMIN 4.5 02/06/2018 0941   AST 14 02/06/2018 0941   ALT 17 02/06/2018 0941   ALKPHOS 111 02/06/2018 0941   BILITOT 0.4 02/06/2018 0941      Component Value Date/Time   TSH 5.100 (H) 02/06/2018 0941   TSH 3.450 09/27/2017 1404   TSH 2.81 06/08/2016 1353    ASSESSMENT AND PLAN: Prediabetes  Class 3 severe obesity with serious comorbidity and body mass index (BMI) of 40.0 to 44.9 in adult, unspecified obesity type ([place])  PLAN:  Pre-Diabetes Meliah will continue to work on weight loss, exercise, and decreasing simple carbohydrates in her diet to help decrease the risk of diabetes. We dicussed metformin including benefits and risks. She was informed that eating too many simple carbohydrates or too many calories at one sitting increases the likelihood of GI side effects. Charlie agrees to continue taking metformin and diet, and we will recheck labs in 1 month. Demarie agrees to follow up with our clinic in 2 to 3 weeks as directed to monitor her progress.  We spent > than 50% of the 15 minute visit on the counseling as documented in the note.  Obesity Stehanie is currently in the action stage of change. As such, her goal is to continue with weight loss efforts She has agreed to follow the Category 1 plan [name] has been instructed to work up to a goal of 150 minutes of combined cardio and strengthening exercise per week for weight loss and overall health benefits. We discussed the following Behavioral Modification Strategies today: increasing lean protein intake and decreasing simple carbohydrates    Deronda has agreed to follow up with our clinic in 2 to 3 weeks. She was informed of the importance of frequent follow up visits to maximize her success with intensive  lifestyle modifications for her multiple health conditions.   OBESITY BEHAVIORAL INTERVENTION VISIT  Today's visit was # 4 out of 22.  Starting weight: 281 lbs Starting date: 02/06/18 Today's weight : 265 lbs  Today's date: 03/28/2018 Total lbs lost to date: 16    ASK: We discussed the diagnosis of obesity with Tally Due today and Meerab agreed to give [name] permission to discuss obesity behavioral modification therapy today.  ASSESS: Sherece has the diagnosis of obesity and her BMI today is 42.79 Jakari is in the action stage of change   ADVISE: Alene was educated on the multiple health risks of obesity as well as the benefit of weight loss to improve her health. She was advised of the need for long term treatment and the importance of lifestyle modifications.  AGREE: Multiple dietary modification options and treatment options were discussed and  Kelise agreed to the above obesity treatment plan.  I, Trixie Dredge, am acting as transcriptionist for Dennard Nip, MD  I have reviewed the above documentation for accuracy and completeness, and I agree with the above. -Dennard Nip, MD

## 2018-04-01 ENCOUNTER — Telehealth: Payer: Self-pay | Admitting: Obstetrics & Gynecology

## 2018-04-01 NOTE — Telephone Encounter (Signed)
Message left to return call to [name] at [phone].    

## 2018-04-01 NOTE — Telephone Encounter (Signed)
Return call again to pt.  Message left to return call to [place] at [phone].

## 2018-04-01 NOTE — Telephone Encounter (Signed)
Pt returned call. She states that she thinks she may be feeling improved. She gave blood yesterday and thinks she may be dehydrated. She feels improved after pushing fluids this morning. No pain currently, no fevers or chills.  Pt feels like she wants to monitor symptoms at home and will call back if symptoms recur.   To Dr. Sabra Heck for review. Encounter closed.

## 2018-04-01 NOTE — Telephone Encounter (Signed)
Patient is having uti symptoms and would like to see dr Sabra Heck today if possible.

## 2018-04-09 ENCOUNTER — Other Ambulatory Visit (INDEPENDENT_AMBULATORY_CARE_PROVIDER_SITE_OTHER): Payer: Self-pay | Admitting: Family Medicine

## 2018-04-09 DIAGNOSIS — R7303 Prediabetes: Secondary | ICD-10-CM

## 2018-04-17 ENCOUNTER — Ambulatory Visit (INDEPENDENT_AMBULATORY_CARE_PROVIDER_SITE_OTHER): Payer: Federal, State, Local not specified - PPO | Admitting: Family Medicine

## 2018-04-17 VITALS — BP 150/82 | HR 76 | Temp 97.4°F | Ht 66.0 in | Wt 256.0 lb

## 2018-04-17 DIAGNOSIS — R7303 Prediabetes: Secondary | ICD-10-CM | POA: Diagnosis not present

## 2018-04-17 DIAGNOSIS — I10 Essential (primary) hypertension: Secondary | ICD-10-CM

## 2018-04-17 DIAGNOSIS — Z9189 Other specified personal risk factors, not elsewhere classified: Secondary | ICD-10-CM

## 2018-04-17 DIAGNOSIS — Z6841 Body Mass Index (BMI) 40.0 and over, adult: Secondary | ICD-10-CM

## 2018-04-17 MED ORDER — METFORMIN HCL 500 MG PO TABS: ORAL_TABLET | ORAL | 0 refills | Status: DC

## 2018-04-18 NOTE — Progress Notes (Signed)
Office: [phone]  /  Fax: [phone]   HPI:   Chief Complaint: OBESITY Cheryl [name] is here to discuss her progress with her obesity treatment plan. She is on the Category 1 plan and is following her eating plan approximately 60 % of the time. She states she is walking a bit more for exercise. Cheryl [name] continues to do very well with weight loss. She is deviating more from her plan and she is not always eating all her food, which may decrease her resting metabolic rate. Her weight is 256 lb (116.1 kg) today and has had a weight loss of 9 pounds over a period of 3 weeks since her last visit. She has lost 25 lbs since starting treatment with [name].  Pre-Diabetes Cheryl [name] has a diagnosis of prediabetes based on her elevated Hgb A1c and was informed this puts her at greater risk of developing diabetes. She is doing well with her diet and metformin. She has decreased polyphagia and she denies hypoglycemia. Cheryl [name] continues to work on diet and exercise to decrease risk of diabetes.   At risk for diabetes Cheryl [name] is at higher than average risk for developing diabetes due to her obesity and prediabetes. She currently denies polyuria or polydipsia.  Hypertension Cheryl [name] is a 62 y.o. female with hypertension. Her blood pressure is slightly elevated today, which is normally controlled. Cheryl [name] denies chest pain or headache. She is working weight loss to help control her blood pressure with the goal of decreasing her risk of heart attack and stroke. Cheryl [name] blood pressure is not currently controlled.  ALLERGIES: Allergies  Allergen Reactions  . Ace Inhibitors Cough  . Lisinopril Cough  . Neosporin [Neomycin-Bacitracin Zn-Polymyx] Rash  . Z-Pak [Azithromycin] Other (See Comments)    thrush    MEDICATIONS: Current Outpatient Medications on File Prior to Visit  Medication Sig Dispense Refill  . aspirin 81 MG chewable tablet Chew 81 mg by mouth daily.    . Cholecalciferol (VITAMIN D3)  1000 UNITS CAPS Take 1 capsule by mouth daily.     . Loratadine (CLARITIN PO) Take 1 tablet by mouth daily.     . meloxicam (MOBIC) 15 MG tablet Take 1 tablet by mouth at bedtime.  3  . nitroGLYCERIN (NITROSTAT) 0.4 MG SL tablet Place 1 tablet (0.4 mg total) under the tongue every 5 (five) minutes as needed for chest pain. 12 tablet 0  . UNABLE TO FIND C PAP for sleep apnea     . venlafaxine (EFFEXOR) 75 MG tablet Take 1 tablet (75 mg total) by mouth daily. 90 tablet 4   Current Facility-Administered Medications on File Prior to Visit  Medication Dose Route Frequency Provider Last Rate Last Dose  . 0.9 %  sodium chloride infusion  500 mL Intravenous Once Cheryl [name], Carlota Raspberry, MD        PAST MEDICAL HISTORY: Past Medical History:  Diagnosis Date  . Allergy   . Arthritis   . Chest pain   . Depression   . Fatigue   . GERD (gastroesophageal reflux disease)   . Hyperlipidemia   . Hypertension   . Joint pain   . Obesity   . Osteopenia   . Palpitation   . Sleep apnea    uses CPAP   . SOB (shortness of breath)   . Urinary incontinence     PAST SURGICAL HISTORY: Past Surgical History:  Procedure Laterality Date  . ABDOMINAL HYSTERECTOMY  08/10/2000   TAH/RSO  . BREAST SURGERY  Biopsy-Benign  . COLOSTOMY  2000   Dr.Patterson  . KNEE ARTHROSCOPY     left knee  . LAPAROSCOPIC CHOLECYSTECTOMY  2010  . LAPAROTOMY  1990   Fibroid removed-ovarian cyst   . RHINOPLASTY      SOCIAL HISTORY: Social History   Tobacco Use  . Smoking status: Former Smoker    Last attempt to quit: 08/15/2007    Years since quitting: 10.6  . Smokeless tobacco: Never Used  Substance Use Topics  . Alcohol use: Yes    Alcohol/week: 3.0 standard drinks    Types: 3 Standard drinks or equivalent per week    Comment: 3 drinks per week per pt   . Drug use: No    FAMILY HISTORY: Family History  Problem Relation Age of Onset  . Breast cancer Mother        Age 17  . Hypertension Mother   .  Depression Mother   . Hypertension Father   . Arthritis Father   . Heart disease Father   . Cancer Father        Spleen  . Sleep apnea Father   . Heart disease Maternal Grandfather   . Colon cancer Paternal Grandfather        Colon cancer  . Esophageal cancer Neg Hx   . Stomach cancer Neg Hx     ROS: Review of Systems  Constitutional: Positive for weight loss.  Cardiovascular: Negative for chest pain.  Genitourinary: Negative for frequency.  Neurological: Negative for headaches.  Endo/Heme/Allergies: Negative for polydipsia.       Positive for polyphagia Negative for hypoglcyemia    PHYSICAL EXAM: Blood pressure (!) 150/82, pulse 76, temperature (!) 97.4 F (36.3 C), temperature source Oral, height 5\' 6"  (1.676 m), weight 256 lb (116.1 kg), SpO2 96 %. Body mass index is 41.32 kg/m. Physical Exam  Constitutional: She is oriented to person, place, and time. She appears well-developed and well-nourished.  Cardiovascular: Normal rate.  Pulmonary/Chest: Effort normal.  Musculoskeletal: Normal range of motion.  Neurological: She is oriented to person, place, and time.  Skin: Skin is warm and dry.  Psychiatric: She has a normal mood and affect. Her behavior is normal.  Vitals reviewed.   RECENT LABS AND TESTS: BMET    Component Value Date/Time   NA 139 02/06/2018 0941   K 4.8 02/06/2018 0941   CL 101 02/06/2018 0941   CO2 24 02/06/2018 0941   GLUCOSE 99 02/06/2018 0941   GLUCOSE 95 06/08/2016 1353   BUN 19 02/06/2018 0941   CREATININE 0.78 02/06/2018 0941   CREATININE 0.81 06/08/2016 1353   CALCIUM 9.4 02/06/2018 0941   GFRNONAA 82 02/06/2018 0941   GFRAA 95 02/06/2018 0941   Lab Results  Component Value Date   HGBA1C 5.8 (H) 02/06/2018   Lab Results  Component Value Date   INSULIN 8.7 02/06/2018   CBC    Component Value Date/Time   WBC 5.2 02/06/2018 0941   WBC 5.0 06/08/2016 1353   RBC 5.01 02/06/2018 0941   RBC 4.94 06/08/2016 1353   HGB 14.1  02/06/2018 0941   HGB 14.3 04/02/2014 0937   HCT 43.7 02/06/2018 0941   PLT 263 06/08/2016 1353   MCV 87 02/06/2018 0941   MCH 28.1 02/06/2018 0941   MCH 27.5 06/08/2016 1353   MCHC 32.3 02/06/2018 0941   MCHC 31.2 (L) 06/08/2016 1353   RDW 14.3 02/06/2018 0941   LYMPHSABS 1.3 02/06/2018 0941   MONOABS 450 06/08/2016 1353  EOSABS 0.2 02/06/2018 0941   BASOSABS 0.1 02/06/2018 0941   Iron/TIBC/Ferritin/ %Sat No results found for: IRON, TIBC, FERRITIN, IRONPCTSAT Lipid Panel     Component Value Date/Time   CHOL 218 (H) 02/06/2018 0941   TRIG 108 02/06/2018 0941   HDL 65 02/06/2018 0941   CHOLHDL 3.1 09/27/2017 1404   CHOLHDL 3.4 06/08/2016 1353   VLDL 19 06/08/2016 1353   LDLCALC 131 (H) 02/06/2018 0941   Hepatic Function Panel     Component Value Date/Time   PROT 6.8 02/06/2018 0941   ALBUMIN 4.5 02/06/2018 0941   AST 14 02/06/2018 0941   ALT 17 02/06/2018 0941   ALKPHOS 111 02/06/2018 0941   BILITOT 0.4 02/06/2018 0941      Component Value Date/Time   TSH 5.100 (H) 02/06/2018 0941   TSH 3.450 09/27/2017 1404   TSH 2.81 06/08/2016 1353   Results for LEYLANIE, WOODMANSEE J "[name]" (MRN [id number]) as of 04/18/2018 08:04  Ref. Range 02/06/2018 09:41  Vitamin D, 25-Hydroxy Latest Ref Range: 30.0 - 100.0 ng/mL 41.5   ASSESSMENT AND PLAN: Prediabetes - Plan: metFORMIN (GLUCOPHAGE) 500 MG tablet  Essential hypertension  At risk for diabetes mellitus  Class 3 severe obesity with serious comorbidity and body mass index (BMI) of 40.0 to 44.9 in adult, unspecified obesity type [facility])  PLAN:  Pre-Diabetes Adaira will continue to work on weight loss, exercise, and decreasing simple carbohydrates in her diet to help decrease the risk of diabetes. We dicussed metformin including benefits and risks. She was informed that eating too many simple carbohydrates or too many calories at one sitting increases the likelihood of GI side effects. Mallerie agreed to continue metformin for now and  a prescription was written today for 1 month refill. Dorraine agreed to follow up with [name] as directed to monitor her progress.  Diabetes risk counseling Darnetta was given extended (15 minutes) diabetes prevention counseling today. She is 62 y.o. female and has risk factors for diabetes including obesity and prediabetes. We discussed intensive lifestyle modifications today with an emphasis on weight loss as well as increasing exercise and decreasing simple carbohydrates in her diet.  Hypertension We discussed sodium restriction, working on healthy weight loss, and a regular exercise program as the means to achieve improved blood pressure control. Shenica agreed with this plan and agreed to follow up as directed. We will recheck blood pressure at the next visit and will continue to monitor her blood pressure as well as her progress with the above lifestyle modifications. She will continue her medications as prescribed and will watch for signs of hypotension as she continues her lifestyle modifications.   Obesity Janica is currently in the action stage of change. As such, her goal is to continue with weight loss efforts She has agreed to keep a food journal with 1000 to 1200 calories and 70+ grams of protein daily Aquanetta has been instructed to work up to a goal of 150 minutes of combined cardio and strengthening exercise per week for weight loss and overall health benefits. We discussed the following Behavioral Modification Strategies today: increasing lean protein intake, decreasing simple carbohydrates  and work on meal planning and easy cooking plans  Sanah has agreed to follow up with our clinic in 4 weeks and with Hoyle Sauer our registered dietician in 2 weeks. She was informed of the importance of frequent follow up visits to maximize her success with intensive lifestyle modifications for her multiple health conditions.   OBESITY BEHAVIORAL INTERVENTION VISIT  Today's  visit was # 5   Starting  weight: 281 lbs Starting date: 02/06/18 Today's weight : 256 lbs Today's date: 04/17/2018 Total lbs lost to date: 25   ASK: We discussed the diagnosis of obesity with Tally Due today and Amberley agreed to give [name] permission to discuss obesity behavioral modification therapy today.  ASSESS: Adriena has the diagnosis of obesity and her BMI today is 41.34 Zonya is in the action stage of change   ADVISE: Rochella was educated on the multiple health risks of obesity as well as the benefit of weight loss to improve her health. She was advised of the need for long term treatment and the importance of lifestyle modifications to improve her current health and to decrease her risk of future health problems.  AGREE: Multiple dietary modification options and treatment options were discussed and  Anastasiya agreed to follow the recommendations documented in the above note.  ARRANGE: Lanie was educated on the importance of frequent visits to treat obesity as outlined per CMS and USPSTF guidelines and agreed to schedule her next follow up appointment today.  I, Doreene Nest, am acting as transcriptionist for Dennard Nip, MD  I have reviewed the above documentation for accuracy and completeness, and I agree with the above. -Dennard Nip, MD

## 2018-05-01 ENCOUNTER — Ambulatory Visit (INDEPENDENT_AMBULATORY_CARE_PROVIDER_SITE_OTHER): Payer: Federal, State, Local not specified - PPO | Admitting: Dietician

## 2018-05-01 VITALS — Ht 66.0 in | Wt 253.0 lb

## 2018-05-01 DIAGNOSIS — Z9189 Other specified personal risk factors, not elsewhere classified: Secondary | ICD-10-CM | POA: Diagnosis not present

## 2018-05-01 DIAGNOSIS — Z6841 Body Mass Index (BMI) 40.0 and over, adult: Secondary | ICD-10-CM

## 2018-05-01 DIAGNOSIS — R7303 Prediabetes: Secondary | ICD-10-CM

## 2018-05-01 NOTE — Progress Notes (Signed)
  Office: [phone]  /  Fax: [phone]     Cheryl [name] has a diagnosis of prediabetes based on her elevated HgA1c and was informed this puts her at greater risk of developing diabetes. Cheryl [name] is here today for diabetes prevention nutrition counseling which includes her obesity treatment plan. Her weight today is 253 lbs, a 3 lb weight loss since her last visit and a total weight loss of 28 lbs since beginning treatment with [name].   Cheryl [name] is on a journaling meal plan of 1000-1200 calories and 70+ g protein daily. She is recording her food intake using the My Fitness Pal application and is journaling approximately 75% of the time. Her food record was reviewed and she is meeting her protein goals most days while staying within her calorie limits. Her metformin was increased from 500 MG qd to 500 MG bid at her last visit however she reports she has continued the metformin at once per day in the morning. She denies any GI side effects with the metformin. She denies significant polyphagia on her current meal plan. She reports she is getting better at meal planning and food journaling.   Patient was educated about food nutrients ie protein, fats, simple and complex carbohydrates and how these affect insulin response. Reviewed the importance of portion control and lean protein foods and  avoiding simple carbohydrates and lower fat foods for ongoing wt loss efforts and glucose management.   Cheryl [name] is on the following meal plan 1000-1200 calories and 70+ g protein Her meal plan was individualized for maximum benefit.  Also discussed at length the following behavioral modifications to help maximize success:  increasing lean protein intake, decreasing simple carbohydrates, increasing vegetables, , meal planning and cooking strategies, keeping healthy foods in the home, planning for success, keeping a strict food journal.   Cheryl [name] has been instructed to work up to a goal of 150 minutes of combined cardio and  strengthening exercise per week for weight loss and overall health benefits. Written information was provided and the following handouts were given: protein content of foods.     OBESITY BEHAVIORAL INTERVENTION VISIT  Today's visit was #5  Starting weight: 281 Starting date: 02/06/18 Today's weight : Weight: 253 lb (114.8 kg)  Today's date: 05/01/2018 Total lbs lost to date: 28 lbs   ASK: We discussed the diagnosis of obesity with Cheryl [name] today and Cheryl [name] agreed to give [name] permission to discuss obesity behavioral modification therapy today.  ASSESS: Cheryl [name] has the diagnosis of obesity and her BMI today is 41. Cheryl [name] is in the action stage of change   ADVISE: Cheryl [name] was educated on the multiple health risks of obesity as well as the benefit of weight loss to improve her health. She was advised of the need for long term treatment and the importance of lifestyle modifications to improve her current health and to decrease her risk of future health problems.  AGREE: Multiple dietary modification options and treatment options were discussed and  Cheryl [name] agreed to follow the recommendations documented in the above note.  ARRANGE: Cheryl [name] was educated on the importance of frequent visits to treat obesity as outlined per CMS and USPSTF guidelines and agreed to schedule her next follow up appointment today.

## 2018-05-08 DIAGNOSIS — Z808 Family history of malignant neoplasm of other organs or systems: Secondary | ICD-10-CM | POA: Diagnosis not present

## 2018-05-08 DIAGNOSIS — D485 Neoplasm of uncertain behavior of skin: Secondary | ICD-10-CM | POA: Diagnosis not present

## 2018-05-08 DIAGNOSIS — D2362 Other benign neoplasm of skin of left upper limb, including shoulder: Secondary | ICD-10-CM | POA: Diagnosis not present

## 2018-05-08 DIAGNOSIS — Z86018 Personal history of other benign neoplasm: Secondary | ICD-10-CM | POA: Diagnosis not present

## 2018-05-08 DIAGNOSIS — L281 Prurigo nodularis: Secondary | ICD-10-CM | POA: Diagnosis not present

## 2018-05-08 DIAGNOSIS — Z85828 Personal history of other malignant neoplasm of skin: Secondary | ICD-10-CM | POA: Diagnosis not present

## 2018-05-08 DIAGNOSIS — D224 Melanocytic nevi of scalp and neck: Secondary | ICD-10-CM | POA: Diagnosis not present

## 2018-05-15 ENCOUNTER — Ambulatory Visit (INDEPENDENT_AMBULATORY_CARE_PROVIDER_SITE_OTHER): Payer: Federal, State, Local not specified - PPO | Admitting: Family Medicine

## 2018-05-15 ENCOUNTER — Other Ambulatory Visit (INDEPENDENT_AMBULATORY_CARE_PROVIDER_SITE_OTHER): Payer: Self-pay | Admitting: Family Medicine

## 2018-05-15 VITALS — BP 167/79 | HR 71 | Temp 97.9°F | Ht 66.0 in | Wt 250.0 lb

## 2018-05-15 DIAGNOSIS — E559 Vitamin D deficiency, unspecified: Secondary | ICD-10-CM

## 2018-05-15 DIAGNOSIS — Z6841 Body Mass Index (BMI) 40.0 and over, adult: Secondary | ICD-10-CM

## 2018-05-15 DIAGNOSIS — R7303 Prediabetes: Secondary | ICD-10-CM | POA: Diagnosis not present

## 2018-05-15 DIAGNOSIS — I10 Essential (primary) hypertension: Secondary | ICD-10-CM

## 2018-05-15 DIAGNOSIS — Z9189 Other specified personal risk factors, not elsewhere classified: Secondary | ICD-10-CM | POA: Diagnosis not present

## 2018-05-15 MED ORDER — LOSARTAN POTASSIUM 50 MG PO TABS: 50.0000 mg | ORAL_TABLET | Freq: Every day | ORAL | 0 refills | Status: DC

## 2018-05-15 NOTE — Progress Notes (Signed)
Office: [phone]  /  Fax: [phone]   HPI:   Chief Complaint: OBESITY Cheryl [name] is here to discuss her progress with her obesity treatment plan. She is keeping a food journal with 1000 to 1200 calories and 70+ grams of protein and is following her eating plan approximately 50 % of the time. She states she is exercising 0 minutes 0 times per week. Cheryl [name] is following her plan well, but is not always journaling [name] to lack of Internet access at work. She is meeting her protein goal.  Her weight is 250 lb (113.4 kg) today and has had a weight loss of 3 pounds over a period of 4 weeks since her last visit. She has lost 31 lbs since starting treatment with [name].  Hypertension Cheryl [name] is a 62 y.o. female with hypertension. Cheryl [name] had chest tightness yesterday, but she feels it was stress related. She had not had chest pain in 2 years. She took nitroglycerine and the chest pain subsided. Her blood pressure was elevated the last 2 visits. She is not currently on medication. She is working weight loss to help control her blood pressure with the goal of decreasing her risk of heart attack and stroke.  At risk for cardiovascular disease Cheryl [name] is at a higher than average risk for cardiovascular disease [name] to hypertension and obesity.   Pre-Diabetes Cheryl [name] has a diagnosis of pre-diabetes based on her elevated Hgb A1c and was informed this puts her at greater risk of developing diabetes. She is stable and taking metformin only once, rather than 2 times a day [name] to her schedule. She continues to work on diet and exercise to decrease risk of diabetes.  Vitamin D deficiency Cheryl [name] has a diagnosis of vitamin D deficiency. Her vitamin D level is stable and she is currently taking OTC vit D and denies nausea, vomiting or muscle weakness.  ALLERGIES: Allergies  Allergen Reactions  . Ace Inhibitors Cough  . Lisinopril Cough  . Neosporin [Neomycin-Bacitracin Zn-Polymyx] Rash  . Z-Pak  [Azithromycin] Other (See Comments)    thrush    MEDICATIONS: Current Outpatient Medications on File Prior to Visit  Medication Sig Dispense Refill  . aspirin 81 MG chewable tablet Chew 81 mg by mouth daily.    . Cholecalciferol (VITAMIN D3) 1000 UNITS CAPS Take 1 capsule by mouth daily.     Marland Kitchen docusate sodium (COLACE) 100 MG capsule Take 100 mg by mouth daily.    . Loratadine (CLARITIN PO) Take 1 tablet by mouth daily.     . meloxicam (MOBIC) 15 MG tablet Take 1 tablet by mouth at bedtime.  3  . metFORMIN (GLUCOPHAGE) 500 MG tablet TAKE 1 TABLET(500 MG) BY MOUTH TWICE DAILY WITH A MEAL (Patient taking differently: 500 mg daily with breakfast. TAKE 1 TABLET(500 MG) BY MOUTH TWICE DAILY WITH A MEAL) 60 tablet 0  . nitroGLYCERIN (NITROSTAT) 0.4 MG SL tablet Place 1 tablet (0.4 mg total) under the tongue every 5 (five) minutes as needed for chest pain. 12 tablet 0  . UNABLE TO FIND C PAP for sleep apnea     . venlafaxine (EFFEXOR) 75 MG tablet Take 1 tablet (75 mg total) by mouth daily. 90 tablet 4   Current Facility-Administered Medications on File Prior to Visit  Medication Dose Route Frequency Provider Last Rate Last Dose  . 0.9 %  sodium chloride infusion  500 mL Intravenous Once Armbruster, Carlota Raspberry, MD        PAST MEDICAL HISTORY: Past Medical  History:  Diagnosis Date  . Allergy   . Arthritis   . Chest pain   . Depression   . Fatigue   . GERD (gastroesophageal reflux disease)   . Hyperlipidemia   . Hypertension   . Joint pain   . Obesity   . Osteopenia   . Palpitation   . Sleep apnea    uses CPAP   . SOB (shortness of breath)   . Urinary incontinence     PAST SURGICAL HISTORY: Past Surgical History:  Procedure Laterality Date  . ABDOMINAL HYSTERECTOMY  08/10/2000   TAH/RSO  . BREAST SURGERY     Biopsy-Benign  . COLOSTOMY  2000   Dr.Patterson  . KNEE ARTHROSCOPY     left knee  . LAPAROSCOPIC CHOLECYSTECTOMY  2010  . LAPAROTOMY  1990   Fibroid removed-ovarian  cyst   . RHINOPLASTY      SOCIAL HISTORY: Social History   Tobacco Use  . Smoking status: Former Smoker    Last attempt to quit: 08/15/2007    Years since quitting: 10.7  . Smokeless tobacco: Never Used  Substance Use Topics  . Alcohol use: Yes    Alcohol/week: 3.0 standard drinks    Types: 3 Standard drinks or equivalent per week    Comment: 3 drinks per week per pt   . Drug use: No    FAMILY HISTORY: Family History  Problem Relation Age of Onset  . Breast cancer Mother        Age 46  . Hypertension Mother   . Depression Mother   . Hypertension Father   . Arthritis Father   . Heart disease Father   . Cancer Father        Spleen  . Sleep apnea Father   . Heart disease Maternal Grandfather   . Colon cancer Paternal Grandfather        Colon cancer  . Esophageal cancer Neg Hx   . Stomach cancer Neg Hx     ROS: Review of Systems  Constitutional: Positive for weight loss.  Cardiovascular: Positive for chest pain.       Positive for chest tightness.  Gastrointestinal: Negative for nausea and vomiting.  Musculoskeletal:       Negative for muscle weakness.    PHYSICAL EXAM: Blood pressure (!) 167/79, pulse 71, temperature 97.9 F (36.6 C), temperature source Oral, height 5\' 6"  (1.676 m), weight 250 lb (113.4 kg), SpO2 98 %. Body mass index is 40.35 kg/m. Physical Exam  Constitutional: She is oriented to person, place, and time. She appears well-developed and well-nourished.  Cardiovascular: Normal rate.  Pulmonary/Chest: Effort normal.  Musculoskeletal: Normal range of motion.  Neurological: She is alert and oriented to person, place, and time.  Skin: Skin is warm and dry.  Psychiatric: She has a normal mood and affect. Her behavior is normal.  Vitals reviewed.   RECENT LABS AND TESTS: BMET    Component Value Date/Time   NA 139 02/06/2018 0941   K 4.8 02/06/2018 0941   CL 101 02/06/2018 0941   CO2 24 02/06/2018 0941   GLUCOSE 99 02/06/2018 0941    GLUCOSE 95 06/08/2016 1353   BUN 19 02/06/2018 0941   CREATININE 0.78 02/06/2018 0941   CREATININE 0.81 06/08/2016 1353   CALCIUM 9.4 02/06/2018 0941   GFRNONAA 82 02/06/2018 0941   GFRAA 95 02/06/2018 0941   Lab Results  Component Value Date   HGBA1C 5.8 (H) 02/06/2018   Lab Results  Component Value Date  INSULIN 8.7 02/06/2018   CBC    Component Value Date/Time   WBC 5.2 02/06/2018 0941   WBC 5.0 06/08/2016 1353   RBC 5.01 02/06/2018 0941   RBC 4.94 06/08/2016 1353   HGB 14.1 02/06/2018 0941   HGB 14.3 04/02/2014 0937   HCT 43.7 02/06/2018 0941   PLT 263 06/08/2016 1353   MCV 87 02/06/2018 0941   MCH 28.1 02/06/2018 0941   MCH 27.5 06/08/2016 1353   MCHC 32.3 02/06/2018 0941   MCHC 31.2 (L) 06/08/2016 1353   RDW 14.3 02/06/2018 0941   LYMPHSABS 1.3 02/06/2018 0941   MONOABS 450 06/08/2016 1353   EOSABS 0.2 02/06/2018 0941   BASOSABS 0.1 02/06/2018 0941   Iron/TIBC/Ferritin/ %Sat No results found for: IRON, TIBC, FERRITIN, IRONPCTSAT Lipid Panel     Component Value Date/Time   CHOL 218 (H) 02/06/2018 0941   TRIG 108 02/06/2018 0941   HDL 65 02/06/2018 0941   CHOLHDL 3.1 09/27/2017 1404   CHOLHDL 3.4 06/08/2016 1353   VLDL 19 06/08/2016 1353   LDLCALC 131 (H) 02/06/2018 0941   Hepatic Function Panel     Component Value Date/Time   PROT 6.8 02/06/2018 0941   ALBUMIN 4.5 02/06/2018 0941   AST 14 02/06/2018 0941   ALT 17 02/06/2018 0941   ALKPHOS 111 02/06/2018 0941   BILITOT 0.4 02/06/2018 0941      Component Value Date/Time   TSH 5.100 (H) 02/06/2018 0941   TSH 3.450 09/27/2017 1404   TSH 2.81 06/08/2016 1353   Results for NEELIE, WELSHANS J "[name]" (MRN [id number]) as of 05/15/2018 15:10  Ref. Range 02/06/2018 09:41  Vitamin D, 25-Hydroxy Latest Ref Range: 30.0 - 100.0 ng/mL 41.5   ASSESSMENT AND PLAN: Essential hypertension - Plan: losartan (COZAAR) 50 MG tablet  Prediabetes  Vitamin D deficiency  At risk for heart disease  Class 3 severe  obesity with serious comorbidity and body mass index (BMI) of 40.0 to 44.9 in adult, unspecified obesity type ([place])  PLAN:  Hypertension We discussed sodium restriction, working on healthy weight loss, and a regular exercise program as the means to achieve improved blood pressure control. Cheryl [name] agrees to start losartan 50mg  qd # 30 with no refills. She was advised to call 911 if she has chest pain that is not relieved by nitroglycerine. Cheryl [name] agreed with this plan and agreed to follow up as directed in 2 weeks.  Cardiovascular risk counselling Cheryl [name] was given extended (15 minutes) coronary artery disease prevention counseling today. She is 62 y.o. female and has risk factors for heart disease including obesity. We discussed intensive lifestyle modifications today with an emphasis on specific weight loss instructions and strategies. Pt was also informed of the importance of increasing exercise and decreasing saturated fats to help prevent heart disease.  Pre-Diabetes Cheryl [name] will continue to work on weight loss, exercise, and decreasing simple carbohydrates in her diet to help decrease the risk of diabetes. She was informed that eating too many simple carbohydrates or too many calories at one sitting increases the likelihood of GI side effects. We will recheck an A1c at her next visit. She agrees to continue metformin and will try to increase to twice daily. She does not need a prescription. Cheryl [name] agreed to follow up with [name] as directed to monitor her progress in 2 weeks.  Vitamin D Deficiency Cheryl [name] was informed that low vitamin D levels contributes to fatigue and are associated with obesity, breast, and colon cancer. She agrees to continue to take  OTC vitamin D 1000 IU qd and will follow up for routine testing of vitamin D, at least 2-3 times per year. She was informed of the risk of over-replacement of vitamin D and agrees to not increase her dose unless she discusses this with [name] first. We will  recheck her vitamin D level at her next visit in 2 weeks.  Obesity Cheryl [name] is currently in the action stage of change. As such, her goal is to continue with weight loss efforts. She has agreed to keep a food journal with 1000 to 1200 calories and 70+ grams of protein. We discussed journaling in the morning or evening, rather than at work. We also discussed the importance of eating all of her food. Cheryl [name] has been instructed to work up to a goal of 150 minutes of combined cardio and strengthening exercise per week for weight loss and overall health benefits. We discussed the following Behavioral Modification Strategies today: increase H2O intake, decreasing sodium intake, avoiding temptations, planning for success, and keep a strict food journal.  Cheryl [name] has agreed to follow up with our clinic in 2 weeks. She was informed of the importance of frequent follow up visits to maximize her success with intensive lifestyle modifications for her multiple health conditions.   OBESITY BEHAVIORAL INTERVENTION VISIT  Today's visit was # 6   Starting weight: 281 lbs Starting date: 02/06/18 Today's weight : Weight: 250 lb (113.4 kg)  Today's date: 05/15/2018 Total lbs lost to date: 85  ASK: We discussed the diagnosis of obesity with Cheryl [name] today and Cheryl [name] agreed to give [name] permission to discuss obesity behavioral modification therapy today.  ASSESS: Cheryl [name] has the diagnosis of obesity and her BMI today is 40.37. Cheryl [name] is in the action stage of change.   ADVISE: Cheryl [name] was educated on the multiple health risks of obesity as well as the benefit of weight loss to improve her health. She was advised of the need for long term treatment and the importance of lifestyle modifications to improve her current health and to decrease her risk of future health problems.  AGREE: Multiple dietary modification options and treatment options were discussed and Kawena agreed to follow the recommendations  documented in the above note.  ARRANGE: Charity was educated on the importance of frequent visits to treat obesity as outlined per CMS and USPSTF guidelines and agreed to schedule her next follow up appointment today.  I, Marcille Blanco, am acting as transcriptionist for Starlyn Skeans, MD  I have reviewed the above documentation for accuracy and completeness, and I agree with the above. -Dennard Nip, MD

## 2018-05-28 ENCOUNTER — Ambulatory Visit (INDEPENDENT_AMBULATORY_CARE_PROVIDER_SITE_OTHER): Payer: Federal, State, Local not specified - PPO | Admitting: Bariatrics

## 2018-05-28 VITALS — BP 121/83 | HR 70 | Temp 97.6°F | Ht 66.0 in | Wt 248.0 lb

## 2018-05-28 DIAGNOSIS — I10 Essential (primary) hypertension: Secondary | ICD-10-CM | POA: Diagnosis not present

## 2018-05-28 DIAGNOSIS — Z8639 Personal history of other endocrine, nutritional and metabolic disease: Secondary | ICD-10-CM

## 2018-05-28 DIAGNOSIS — Z9189 Other specified personal risk factors, not elsewhere classified: Secondary | ICD-10-CM | POA: Diagnosis not present

## 2018-05-28 DIAGNOSIS — Z6841 Body Mass Index (BMI) 40.0 and over, adult: Secondary | ICD-10-CM

## 2018-05-28 DIAGNOSIS — R7303 Prediabetes: Secondary | ICD-10-CM

## 2018-05-28 MED ORDER — LOSARTAN POTASSIUM 50 MG PO TABS: 50.0000 mg | ORAL_TABLET | Freq: Every day | ORAL | 0 refills | Status: DC

## 2018-05-29 LAB — HEMOGLOBIN A1C
ESTIMATED AVERAGE GLUCOSE: 111 mg/dL
Hgb A1c MFr Bld: 5.5 % (ref 4.8–5.6)

## 2018-05-29 LAB — CBC WITH DIFFERENTIAL
BASOS ABS: 0.1 10*3/uL (ref 0.0–0.2)
Basos: 2 %
EOS (ABSOLUTE): 0.1 10*3/uL (ref 0.0–0.4)
EOS: 3 %
HEMOGLOBIN: 13.5 g/dL (ref 11.1–15.9)
Hematocrit: 40.4 % (ref 34.0–46.6)
IMMATURE GRANS (ABS): 0 10*3/uL (ref 0.0–0.1)
IMMATURE GRANULOCYTES: 0 %
LYMPHS ABS: 1.1 10*3/uL (ref 0.7–3.1)
LYMPHS: 23 %
MCH: 29.2 pg (ref 26.6–33.0)
MCHC: 33.4 g/dL (ref 31.5–35.7)
MCV: 87 fL (ref 79–97)
Monocytes Absolute: 0.5 10*3/uL (ref 0.1–0.9)
Monocytes: 10 %
NEUTROS ABS: 3 10*3/uL (ref 1.4–7.0)
NEUTROS PCT: 62 %
RBC: 4.63 x10E6/uL (ref 3.77–5.28)
RDW: 13.4 % (ref 12.3–15.4)
WBC: 4.8 10*3/uL (ref 3.4–10.8)

## 2018-05-29 LAB — T3: T3, Total: 100 ng/dL (ref 71–180)

## 2018-05-29 LAB — LIPID PANEL WITH LDL/HDL RATIO
CHOLESTEROL TOTAL: 200 mg/dL — AB (ref 100–199)
HDL: 55 mg/dL (ref >39)
LDL Calculated: 129 mg/dL — ABNORMAL HIGH (ref 0–99)
LDl/HDL Ratio: 2.3 ratio (ref 0.0–3.2)
Triglycerides: 78 mg/dL (ref 0–149)
VLDL Cholesterol Cal: 16 mg/dL (ref 5–40)

## 2018-05-29 LAB — VITAMIN B12: Vitamin B-12: 408 pg/mL (ref 232–1245)

## 2018-05-29 LAB — TSH: TSH: 3.43 u[IU]/mL (ref 0.450–4.500)

## 2018-05-29 LAB — COMPREHENSIVE METABOLIC PANEL
ALBUMIN: 4.4 g/dL (ref 3.6–4.8)
ALT: 10 IU/L (ref 0–32)
AST: 11 IU/L (ref 0–40)
Albumin/Globulin Ratio: 2.1 (ref 1.2–2.2)
Alkaline Phosphatase: 97 IU/L (ref 39–117)
BILIRUBIN TOTAL: 0.3 mg/dL (ref 0.0–1.2)
BUN / CREAT RATIO: 32 — AB (ref 12–28)
BUN: 23 mg/dL (ref 8–27)
CALCIUM: 9.5 mg/dL (ref 8.7–10.3)
CHLORIDE: 104 mmol/L (ref 96–106)
CO2: 23 mmol/L (ref 20–29)
CREATININE: 0.71 mg/dL (ref 0.57–1.00)
GFR calc non Af Amer: 92 mL/min/{1.73_m2} (ref >59)
GFR, EST AFRICAN AMERICAN: 106 mL/min/{1.73_m2} (ref >59)
GLUCOSE: 86 mg/dL (ref 65–99)
Globulin, Total: 2.1 g/dL (ref 1.5–4.5)
Potassium: 4.3 mmol/L (ref 3.5–5.2)
Sodium: 143 mmol/L (ref 134–144)
TOTAL PROTEIN: 6.5 g/dL (ref 6.0–8.5)

## 2018-05-29 LAB — T4, FREE: FREE T4: 1.04 ng/dL (ref 0.82–1.77)

## 2018-05-29 LAB — FOLATE: FOLATE: 4.4 ng/mL (ref >3.0)

## 2018-05-29 LAB — VITAMIN D 25 HYDROXY (VIT D DEFICIENCY, FRACTURES): VIT D 25 HYDROXY: 47 ng/mL (ref 30.0–100.0)

## 2018-05-29 LAB — INSULIN, RANDOM: INSULIN: 6.2 u[IU]/mL (ref 2.6–24.9)

## 2018-05-30 DIAGNOSIS — R7303 Prediabetes: Secondary | ICD-10-CM | POA: Insufficient documentation

## 2018-05-30 NOTE — Progress Notes (Signed)
Office: [phone]  /  Fax: [phone]   HPI:   Chief Complaint: OBESITY Cheryl [name] is here to discuss her progress with her obesity treatment plan. She is keeping a food journal with 1000 to 1200 calories and 70 grams of protein and is following her eating plan approximately 60 % of the time. She states she is exercising 0 minutes 0 times per week. [name] is not having significant hunger or cravings.   Her weight is 248 lb (112.5 kg) today and has had a weight loss of 2 pounds over a period of 2 weeks since her last visit. She has lost 33 lbs since starting treatment with [name].  Hypertension Cheryl [name] is a 62 y.o. female with hypertension. Cheryl [name] denies chest pain. She is working on weight loss to help control her blood pressure with the goal of decreasing her risk of heart attack and stroke. She is taking losartan 50mg . Cheryl [name]'s blood pressure is currently controlled.  Pre-Diabetes Cheryl [name] has a diagnosis of pre-diabetes based on her elevated Hgb A1c and was informed this puts her at greater risk of developing diabetes. Her A1c was 5.8 and Insulin was 8.7 on 02/06/18. She is taking metformin with no problems and continues to work on diet and exercise to decrease risk of diabetes.  At risk for diabetes Cheryl [name] is at higher than average risk for developing diabetes due to her pre-diabetes and obesity.  History of Vitamin D deficiency Cheryl [name] has a history of vitamin D deficiency. She is currently taking OTC vit D.  ALLERGIES: Allergies  Allergen Reactions  . Ace Inhibitors Cough  . Lisinopril Cough  . Neosporin [Neomycin-Bacitracin Zn-Polymyx] Rash  . Z-Pak [Azithromycin] Other (See Comments)    thrush    MEDICATIONS: Current Outpatient Medications on File Prior to Visit  Medication Sig Dispense Refill  . aspirin 81 MG chewable tablet Chew 81 mg by mouth daily.    . Cholecalciferol (VITAMIN D3) 1000 UNITS CAPS Take 1 capsule by mouth daily.     Marland Kitchen docusate sodium (COLACE) 100  MG capsule Take 100 mg by mouth daily.    . Loratadine (CLARITIN PO) Take 1 tablet by mouth daily.     . meloxicam (MOBIC) 15 MG tablet Take 1 tablet by mouth at bedtime.  3  . metFORMIN (GLUCOPHAGE) 500 MG tablet TAKE 1 TABLET(500 MG) BY MOUTH TWICE DAILY WITH A MEAL (Patient taking differently: 500 mg daily with breakfast. TAKE 1 TABLET(500 MG) BY MOUTH TWICE DAILY WITH A MEAL) 60 tablet 0  . nitroGLYCERIN (NITROSTAT) 0.4 MG SL tablet Place 1 tablet (0.4 mg total) under the tongue every 5 (five) minutes as needed for chest pain. 12 tablet 0  . UNABLE TO FIND C PAP for sleep apnea     . venlafaxine (EFFEXOR) 75 MG tablet Take 1 tablet (75 mg total) by mouth daily. 90 tablet 4   Current Facility-Administered Medications on File Prior to Visit  Medication Dose Route Frequency Provider Last Rate Last Dose  . 0.9 %  sodium chloride infusion  500 mL Intravenous Once Armbruster, Carlota Raspberry, MD        PAST MEDICAL HISTORY: Past Medical History:  Diagnosis Date  . Allergy   . Arthritis   . Chest pain   . Depression   . Fatigue   . GERD (gastroesophageal reflux disease)   . Hyperlipidemia   . Hypertension   . Joint pain   . Obesity   . Osteopenia   . Palpitation   .  Sleep apnea    uses CPAP   . SOB (shortness of breath)   . Urinary incontinence     PAST SURGICAL HISTORY: Past Surgical History:  Procedure Laterality Date  . ABDOMINAL HYSTERECTOMY  08/10/2000   TAH/RSO  . BREAST SURGERY     Biopsy-Benign  . COLOSTOMY  2000   Dr.Patterson  . KNEE ARTHROSCOPY     left knee  . LAPAROSCOPIC CHOLECYSTECTOMY  2010  . LAPAROTOMY  1990   Fibroid removed-ovarian cyst   . RHINOPLASTY      SOCIAL HISTORY: Social History   Tobacco Use  . Smoking status: Former Smoker    Last attempt to quit: 08/15/2007    Years since quitting: 10.7  . Smokeless tobacco: Never Used  Substance Use Topics  . Alcohol use: Yes    Alcohol/week: 3.0 standard drinks    Types: 3 Standard drinks or  equivalent per week    Comment: 3 drinks per week per pt   . Drug use: No    FAMILY HISTORY: Family History  Problem Relation Age of Onset  . Breast cancer Mother        Age 61  . Hypertension Mother   . Depression Mother   . Hypertension Father   . Arthritis Father   . Heart disease Father   . Cancer Father        Spleen  . Sleep apnea Father   . Heart disease Maternal Grandfather   . Colon cancer Paternal Grandfather        Colon cancer  . Esophageal cancer Neg Hx   . Stomach cancer Neg Hx     ROS: Review of Systems  Constitutional: Positive for weight loss.  Cardiovascular: Negative for chest pain.    PHYSICAL EXAM: Blood pressure 121/83, pulse 70, temperature 97.6 F (36.4 C), temperature source Oral, height 5\' 6"  (1.676 m), weight 248 lb (112.5 kg), SpO2 98 %. Body mass index is 40.03 kg/m. Physical Exam  Constitutional: She is oriented to person, place, and time. She appears well-developed and well-nourished.  Cardiovascular: Normal rate.  Pulmonary/Chest: Effort normal.  Musculoskeletal: Normal range of motion.  Neurological: She is oriented to person, place, and time.  Skin: Skin is warm and dry.  Psychiatric: She has a normal mood and affect. Her behavior is normal.  Vitals reviewed.   RECENT LABS AND TESTS: BMET    Component Value Date/Time   NA 143 05/28/2018 0950   K 4.3 05/28/2018 0950   CL 104 05/28/2018 0950   CO2 23 05/28/2018 0950   GLUCOSE 86 05/28/2018 0950   GLUCOSE 95 06/08/2016 1353   BUN 23 05/28/2018 0950   CREATININE 0.71 05/28/2018 0950   CREATININE 0.81 06/08/2016 1353   CALCIUM 9.5 05/28/2018 0950   GFRNONAA 92 05/28/2018 0950   GFRAA 106 05/28/2018 0950   Lab Results  Component Value Date   HGBA1C 5.5 05/28/2018   HGBA1C 5.8 (H) 02/06/2018   Lab Results  Component Value Date   INSULIN 6.2 05/28/2018   INSULIN 8.7 02/06/2018   CBC    Component Value Date/Time   WBC 4.8 05/28/2018 0950   WBC 5.0 06/08/2016 1353     RBC 4.63 05/28/2018 0950   RBC 4.94 06/08/2016 1353   HGB 13.5 05/28/2018 0950   HGB 14.3 04/02/2014 0937   HCT 40.4 05/28/2018 0950   PLT 263 06/08/2016 1353   MCV 87 05/28/2018 0950   MCH 29.2 05/28/2018 0950   MCH 27.5 06/08/2016 1353  MCHC 33.4 05/28/2018 0950   MCHC 31.2 (L) 06/08/2016 1353   RDW 13.4 05/28/2018 0950   LYMPHSABS 1.1 05/28/2018 0950   MONOABS 450 06/08/2016 1353   EOSABS 0.1 05/28/2018 0950   BASOSABS 0.1 05/28/2018 0950   Iron/TIBC/Ferritin/ %Sat No results found for: IRON, TIBC, FERRITIN, IRONPCTSAT Lipid Panel     Component Value Date/Time   CHOL 200 (H) 05/28/2018 0950   TRIG 78 05/28/2018 0950   HDL 55 05/28/2018 0950   CHOLHDL 3.1 09/27/2017 1404   CHOLHDL 3.4 06/08/2016 1353   VLDL 19 06/08/2016 1353   LDLCALC 129 (H) 05/28/2018 0950   Hepatic Function Panel     Component Value Date/Time   PROT 6.5 05/28/2018 0950   ALBUMIN 4.4 05/28/2018 0950   AST 11 05/28/2018 0950   ALT 10 05/28/2018 0950   ALKPHOS 97 05/28/2018 0950   BILITOT 0.3 05/28/2018 0950      Component Value Date/Time   TSH 3.430 05/28/2018 0950   TSH 5.100 (H) 02/06/2018 0941   TSH 3.450 09/27/2017 1404   Results for DEVONY, MCGRADY "[name]" (MRN [id number]) as of 05/30/2018 09:03  Ref. Range 05/28/2018 09:50  Vitamin D, 25-Hydroxy Latest Ref Range: 30.0 - 100.0 ng/mL 47.0   ASSESSMENT AND PLAN: Essential hypertension - Plan: Vitamin B12, CBC With Differential, Folate, Lipid Panel With LDL/HDL Ratio, T3, T4, free, TSH, VITAMIN D 25 Hydroxy (Vit-D Deficiency, Fractures), losartan (COZAAR) 50 MG tablet  Prediabetes - Plan: Comprehensive metabolic panel, Hemoglobin A1c, Insulin, random  History of vitamin D deficiency  At risk for diabetes mellitus  Class 3 severe obesity with serious comorbidity and body mass index (BMI) of 40.0 to 44.9 in adult, unspecified obesity type ([facility])  PLAN:  Hypertension We discussed sodium restriction, working on healthy weight loss,  and a regular exercise program as the means to achieve improved blood pressure control. We will continue to monitor her blood pressure as well as her progress with the above lifestyle modifications. She agrees to continue her losartan 50mg  1 PO daily #30 with no refills and will watch for signs of hypotension as she continues her lifestyle modifications. Alizon agreed with this plan and agreed to follow up as directed in 2 weeks.  Pre-Diabetes Cobi will continue to work on weight loss, exercise, and decreasing simple carbohydrates in her diet to help decrease the risk of diabetes. She was informed that eating too many simple carbohydrates or too many calories at one sitting increases the likelihood of GI side effects. Elisabetta agrees to continue taking metformin and a prescription was not written today. Azoria agreed to follow up with [name] as directed to monitor her progress.  Diabetes risk counseling Daniya was given extended (15 minutes) diabetes prevention counseling today. She is 62 y.o. female and has risk factors for diabetes including pre-diabetes and obesity. We discussed intensive lifestyle modifications today with an emphasis on weight loss as well as increasing exercise and decreasing simple carbohydrates in her diet.  History of Vitamin D deficiency We will check her vitamin D level today.  Obesity Mariapaula is currently in the action stage of change. As such, her goal is to continue with weight loss efforts. She has agreed to keep a food journal with 1000 to 1200 calories and 70 grams of protein. She was given information on "Recipes". Yaretzy has been instructed to work up to a goal of 150 minutes of combined cardio and strengthening exercise per week for weight loss and overall health benefits.  Joell has  agreed to follow up with our clinic in 2 weeks. She was informed of the importance of frequent follow up visits to maximize her success with intensive lifestyle modifications for her  multiple health conditions.   OBESITY BEHAVIORAL INTERVENTION VISIT  Today's visit was # 6   Starting weight: 281 lbs Starting date: 02/06/18 Today's weight : Weight: 248 lb (112.5 kg)  Today's date: 05/28/2018 Total lbs lost to date: 87  ASK: We discussed the diagnosis of obesity with Tally Due today and Elana agreed to give [name] permission to discuss obesity behavioral modification therapy today.  ASSESS: Kia has the diagnosis of obesity and her BMI today is 40.05. Rhilee is in the action stage of change.   ADVISE: Breuna was educated on the multiple health risks of obesity as well as the benefit of weight loss to improve her health. She was advised of the need for long term treatment and the importance of lifestyle modifications to improve her current health and to decrease her risk of future health problems.  AGREE: Multiple dietary modification options and treatment options were discussed and Lumina agreed to follow the recommendations documented in the above note.  ARRANGE: Gatha was educated on the importance of frequent visits to treat obesity as outlined per CMS and USPSTF guidelines and agreed to schedule her next follow up appointment today.  I, Marcille Blanco, am acting as Location manager for [employer]. Owens Shark, DO  I have reviewed the above documentation for accuracy and completeness, and I agree with the above. -Jearld Lesch, DO

## 2018-06-11 ENCOUNTER — Other Ambulatory Visit (INDEPENDENT_AMBULATORY_CARE_PROVIDER_SITE_OTHER): Payer: Self-pay | Admitting: Family Medicine

## 2018-06-11 DIAGNOSIS — R7303 Prediabetes: Secondary | ICD-10-CM

## 2018-06-13 ENCOUNTER — Ambulatory Visit (INDEPENDENT_AMBULATORY_CARE_PROVIDER_SITE_OTHER): Payer: Federal, State, Local not specified - PPO | Admitting: Family Medicine

## 2018-06-13 VITALS — BP 150/82 | HR 68 | Temp 97.5°F | Ht 66.0 in | Wt 244.0 lb

## 2018-06-13 DIAGNOSIS — I10 Essential (primary) hypertension: Secondary | ICD-10-CM | POA: Diagnosis not present

## 2018-06-13 DIAGNOSIS — Z6839 Body mass index (BMI) 39.0-39.9, adult: Secondary | ICD-10-CM | POA: Diagnosis not present

## 2018-06-15 NOTE — Progress Notes (Signed)
Office: [phone]  /  Fax: [phone]   HPI:   Chief Complaint: OBESITY Cheryl [name] is here to discuss her progress with her obesity treatment plan. She is on the  keep a food journal with 1000-1200 calories and 70+ grams of protein  and is following her eating plan approximately 60 % of the time. She states she is exercising 0 minutes 0 times per week. Cheryl [name] continues to do well with weight loss. She is working on [employer] protein and journaling on and off. She states her hunger is controlled.  Her weight is 244 lb (110.7 kg) today and has had a weight loss of 4 pounds over a period of 2 weeks since her last visit. She has lost 37 lbs since starting treatment with [name].  Hypertension Cheryl [name] is a 62 y.o. female with hypertension. Cheryl [name]'s blood pressure is elevated today. She denies chest pain or headaches. She is on Losartan and she is working diet and weight loss to help control her blood pressure with the goal of decreasing her risk of heart attack and stroke. Cheryl [name]'s blood pressure is not currently controlled.  ALLERGIES: Allergies  Allergen Reactions  . Ace Inhibitors Cough  . Lisinopril Cough  . Neosporin [Neomycin-Bacitracin Zn-Polymyx] Rash  . Z-Pak [Azithromycin] Other (See Comments)    thrush    MEDICATIONS: Current Outpatient Medications on File Prior to Visit  Medication Sig Dispense Refill  . aspirin 81 MG chewable tablet Chew 81 mg by mouth daily.    . Cholecalciferol (VITAMIN D3) 1000 UNITS CAPS Take 1 capsule by mouth daily.     Cheryl [name] Kitchen docusate sodium (COLACE) 100 MG capsule Take 100 mg by mouth daily.    . Loratadine (CLARITIN PO) Take 1 tablet by mouth daily.     Cheryl [name] Kitchen losartan (COZAAR) 50 MG tablet Take 1 tablet (50 mg total) by mouth daily. 30 tablet 0  . meloxicam (MOBIC) 15 MG tablet Take 1 tablet by mouth at bedtime.  3  . metFORMIN (GLUCOPHAGE) 500 MG tablet TAKE 1 TABLET(500 MG) BY MOUTH TWICE DAILY WITH A MEAL (Patient taking differently: 500 mg  daily with breakfast. TAKE 1 TABLET(500 MG) BY MOUTH TWICE DAILY WITH A MEAL) 60 tablet 0  . nitroGLYCERIN (NITROSTAT) 0.4 MG SL tablet Place 1 tablet (0.4 mg total) under the tongue every 5 (five) minutes as needed for chest pain. 12 tablet 0  . UNABLE TO FIND C PAP for sleep apnea     . venlafaxine (EFFEXOR) 75 MG tablet Take 1 tablet (75 mg total) by mouth daily. 90 tablet 4   Current Facility-Administered Medications on File Prior to Visit  Medication Dose Route Frequency Provider Last Rate Last Dose  . 0.9 %  sodium chloride infusion  500 mL Intravenous Once Armbruster, Carlota Raspberry, MD        PAST MEDICAL HISTORY: Past Medical History:  Diagnosis Date  . Allergy   . Arthritis   . Chest pain   . Depression   . Fatigue   . GERD (gastroesophageal reflux disease)   . Hyperlipidemia   . Hypertension   . Joint pain   . Obesity   . Osteopenia   . Palpitation   . Sleep apnea    uses CPAP   . SOB (shortness of breath)   . Urinary incontinence     PAST SURGICAL HISTORY: Past Surgical History:  Procedure Laterality Date  . ABDOMINAL HYSTERECTOMY  08/10/2000   TAH/RSO  . BREAST SURGERY  Biopsy-Benign  . COLOSTOMY  2000   Dr.Patterson  . KNEE ARTHROSCOPY     left knee  . LAPAROSCOPIC CHOLECYSTECTOMY  2010  . LAPAROTOMY  1990   Fibroid removed-ovarian cyst   . RHINOPLASTY      SOCIAL HISTORY: Social History   Tobacco Use  . Smoking status: Former Smoker    Last attempt to quit: 08/15/2007    Years since quitting: 10.8  . Smokeless tobacco: Never Used  Substance Use Topics  . Alcohol use: Yes    Alcohol/week: 3.0 standard drinks    Types: 3 Standard drinks or equivalent per week    Comment: 3 drinks per week per pt   . Drug use: No    FAMILY HISTORY: Family History  Problem Relation Age of Onset  . Breast cancer Mother        Age 31  . Hypertension Mother   . Depression Mother   . Hypertension Father   . Arthritis Father   . Heart disease Father   .  Cancer Father        Spleen  . Sleep apnea Father   . Heart disease Maternal Grandfather   . Colon cancer Paternal Grandfather        Colon cancer  . Esophageal cancer Neg Hx   . Stomach cancer Neg Hx     ROS: Review of Systems  Constitutional: Positive for weight loss.  Cardiovascular: Negative for chest pain.  Neurological: Negative for headaches.    PHYSICAL EXAM: Blood pressure (!) 150/82, pulse 68, temperature (!) 97.5 F (36.4 C), temperature source Oral, height 5\' 6"  (1.676 m), weight 244 lb (110.7 kg), SpO2 97 %. Body mass index is 39.38 kg/m. Physical Exam  Constitutional: She is oriented to person, place, and time. She appears well-developed and well-nourished.  Cardiovascular: Normal rate.  Pulmonary/Chest: Effort normal.  Musculoskeletal: Normal range of motion.  Neurological: She is oriented to person, place, and time.  Skin: Skin is warm and dry.  Psychiatric: She has a normal mood and affect. Her behavior is normal.  Vitals reviewed.   RECENT LABS AND TESTS: BMET    Component Value Date/Time   NA 143 05/28/2018 0950   K 4.3 05/28/2018 0950   CL 104 05/28/2018 0950   CO2 23 05/28/2018 0950   GLUCOSE 86 05/28/2018 0950   GLUCOSE 95 06/08/2016 1353   BUN 23 05/28/2018 0950   CREATININE 0.71 05/28/2018 0950   CREATININE 0.81 06/08/2016 1353   CALCIUM 9.5 05/28/2018 0950   GFRNONAA 92 05/28/2018 0950   GFRAA 106 05/28/2018 0950   Lab Results  Component Value Date   HGBA1C 5.5 05/28/2018   HGBA1C 5.8 (H) 02/06/2018   Lab Results  Component Value Date   INSULIN 6.2 05/28/2018   INSULIN 8.7 02/06/2018   CBC    Component Value Date/Time   WBC 4.8 05/28/2018 0950   WBC 5.0 06/08/2016 1353   RBC 4.63 05/28/2018 0950   RBC 4.94 06/08/2016 1353   HGB 13.5 05/28/2018 0950   HGB 14.3 04/02/2014 0937   HCT 40.4 05/28/2018 0950   PLT 263 06/08/2016 1353   MCV 87 05/28/2018 0950   MCH 29.2 05/28/2018 0950   MCH 27.5 06/08/2016 1353   MCHC 33.4  05/28/2018 0950   MCHC 31.2 (L) 06/08/2016 1353   RDW 13.4 05/28/2018 0950   LYMPHSABS 1.1 05/28/2018 0950   MONOABS 450 06/08/2016 1353   EOSABS 0.1 05/28/2018 0950   BASOSABS 0.1 05/28/2018 0950  Iron/TIBC/Ferritin/ %Sat No results found for: IRON, TIBC, FERRITIN, IRONPCTSAT Lipid Panel     Component Value Date/Time   CHOL 200 (H) 05/28/2018 0950   TRIG 78 05/28/2018 0950   HDL 55 05/28/2018 0950   CHOLHDL 3.1 09/27/2017 1404   CHOLHDL 3.4 06/08/2016 1353   VLDL 19 06/08/2016 1353   LDLCALC 129 (H) 05/28/2018 0950   Hepatic Function Panel     Component Value Date/Time   PROT 6.5 05/28/2018 0950   ALBUMIN 4.4 05/28/2018 0950   AST 11 05/28/2018 0950   ALT 10 05/28/2018 0950   ALKPHOS 97 05/28/2018 0950   BILITOT 0.3 05/28/2018 0950      Component Value Date/Time   TSH 3.430 05/28/2018 0950   TSH 5.100 (H) 02/06/2018 0941   TSH 3.450 09/27/2017 1404    ASSESSMENT AND PLAN: Essential hypertension  Class 2 severe obesity with serious comorbidity and body mass index (BMI) of 39.0 to 39.9 in adult, unspecified obesity type (HCC)  PLAN:  Hypertension We discussed sodium restriction, working on healthy weight loss, and a regular exercise program as the means to achieve improved blood pressure control. Cheryl [name] agreed with this plan and agreed to follow up as directed. We will continue to monitor her blood pressure as well as her progress with the above lifestyle modifications. Cheryl [name] is to take Losartan in the PM and continue with diet. She will watch for signs of hypotension as she continues her lifestyle modifications. Cheryl [name] agrees to follow up with our clinic in 2 to 3 weeks and we will recheck blood pressure at that time.  I spent > than 50% of the 15 minute visit on counseling as documented in the note.  Obesity Cheryl [name] is currently in the action stage of change. As such, her goal is to continue with weight loss efforts She has agreed to keep a food journal with  1000-1200 calories and 70+ grams of protein daily Cheryl [name] has been instructed to work up to a goal of 150 minutes of combined cardio and strengthening exercise per week or start water aerobics in the next 2 weeks for weight loss and overall health benefits. We discussed the following Behavioral Modification Strategies today: increasing lean protein intake, decreasing simple carbohydrates , work on meal planning and easy cooking plans and holiday eating strategies    Cheryl [name] has agreed to follow up with our clinic in 2 to 3 weeks. She was informed of the importance of frequent follow up visits to maximize her success with intensive lifestyle modifications for her multiple health conditions.   OBESITY BEHAVIORAL INTERVENTION VISIT  Today's visit was # 8   Starting weight: 281 lbs Starting date: 02/06/18 Today's weight : 244 lbs Today's date: 06/13/2018 Total lbs lost to date: 68    ASK: We discussed the diagnosis of obesity with Cheryl [name] today and Cheryl [name] agreed to give [name] permission to discuss obesity behavioral modification therapy today.  ASSESS: Cheryl [name] has the diagnosis of obesity and her BMI today is 39.4 Cheryl [name] is in the action stage of change   ADVISE: Cheryl [name] was educated on the multiple health risks of obesity as well as the benefit of weight loss to improve her health. She was advised of the need for long term treatment and the importance of lifestyle modifications to improve her current health and to decrease her risk of future health problems.  AGREE: Multiple dietary modification options and treatment options were discussed and  Cheryl [name] agreed to follow the recommendations documented in the  above note.  ARRANGE: Cheryl [name] was educated on the importance of frequent visits to treat obesity as outlined per CMS and USPSTF guidelines and agreed to schedule her next follow up appointment today.  I, Trixie Dredge, am acting as transcriptionist for Dennard Nip, MD  I have  reviewed the above documentation for accuracy and completeness, and I agree with the above. -Dennard Nip, MD

## 2018-06-17 ENCOUNTER — Encounter (INDEPENDENT_AMBULATORY_CARE_PROVIDER_SITE_OTHER): Payer: Self-pay | Admitting: Family Medicine

## 2018-07-01 ENCOUNTER — Other Ambulatory Visit (INDEPENDENT_AMBULATORY_CARE_PROVIDER_SITE_OTHER): Payer: Self-pay | Admitting: Family Medicine

## 2018-07-01 ENCOUNTER — Ambulatory Visit (INDEPENDENT_AMBULATORY_CARE_PROVIDER_SITE_OTHER): Payer: Federal, State, Local not specified - PPO | Admitting: Family Medicine

## 2018-07-01 VITALS — BP 150/73 | HR 69 | Temp 97.4°F | Ht 66.0 in | Wt 243.0 lb

## 2018-07-01 DIAGNOSIS — I10 Essential (primary) hypertension: Secondary | ICD-10-CM

## 2018-07-01 DIAGNOSIS — E559 Vitamin D deficiency, unspecified: Secondary | ICD-10-CM

## 2018-07-01 DIAGNOSIS — Z6839 Body mass index (BMI) 39.0-39.9, adult: Secondary | ICD-10-CM

## 2018-07-01 DIAGNOSIS — Z9189 Other specified personal risk factors, not elsewhere classified: Secondary | ICD-10-CM

## 2018-07-01 MED ORDER — LOSARTAN POTASSIUM 100 MG PO TABS: 100.0000 mg | ORAL_TABLET | Freq: Every day | ORAL | 0 refills | Status: DC

## 2018-07-03 NOTE — Progress Notes (Signed)
Office: [phone]  /  Fax: [phone]   HPI:   Chief Complaint: OBESITY Cheryl [name] is here to discuss her progress with her obesity treatment plan. She is on the keep a food journal with 1000-1200 calories and 70+ grams of protein daily and is following her eating plan approximately 50 % of the time. She states she is exercising 0 minutes 0 times per week. Cheryl [name] continues to do well with weight loss, but is struggling to journal regularly. She has questions about how to handle Thanksgiving temptations.  Her weight is 243 lb (110.2 kg) today and has had a weight loss of 1 pound over a period of 2 to 3 weeks since her last visit. She has lost 38 lbs since starting treatment with [name].  Hypertension Cheryl [name] is a 62 y.o. female with hypertension. Cheryl [name]'s blood pressure is still elevated today. She is on losartan and denies chest pain or headaches. She is working weight loss to help control her blood pressure with the goal of decreasing her risk of heart attack and stroke. Cheryl [name]'s blood pressure is not currently controlled.  Vitamin D Deficiency Cheryl [name] has a diagnosis of vitamin D deficiency. She is stable on OTC Vit D and denies nausea, vomiting or muscle weakness.  At risk for diabetes Cheryl [name] is at higher than average risk for developing diabetes [name] to her obesity. She currently denies polyuria or polydipsia.  ALLERGIES: Allergies  Allergen Reactions  . Ace Inhibitors Cough  . Lisinopril Cough  . Neosporin [Neomycin-Bacitracin Zn-Polymyx] Rash  . Z-Pak [Azithromycin] Other (See Comments)    thrush    MEDICATIONS: Current Outpatient Medications on File Prior to Visit  Medication Sig Dispense Refill  . aspirin 81 MG chewable tablet Chew 81 mg by mouth daily.    . Cholecalciferol (VITAMIN D3) 1000 UNITS CAPS Take 1 capsule by mouth daily.     Marland Kitchen docusate sodium (COLACE) 100 MG capsule Take 100 mg by mouth daily.    . Loratadine (CLARITIN PO) Take 1 tablet by mouth daily.      . meloxicam (MOBIC) 15 MG tablet Take 1 tablet by mouth at bedtime.  3  . metFORMIN (GLUCOPHAGE) 500 MG tablet TAKE 1 TABLET(500 MG) BY MOUTH TWICE DAILY WITH A MEAL (Patient taking differently: 500 mg daily with breakfast. TAKE 1 TABLET(500 MG) BY MOUTH TWICE DAILY WITH A MEAL) 60 tablet 0  . nitroGLYCERIN (NITROSTAT) 0.4 MG SL tablet Place 1 tablet (0.4 mg total) under the tongue every 5 (five) minutes as needed for chest pain. 12 tablet 0  . UNABLE TO FIND C PAP for sleep apnea     . venlafaxine (EFFEXOR) 75 MG tablet Take 1 tablet (75 mg total) by mouth daily. 90 tablet 4   Current Facility-Administered Medications on File Prior to Visit  Medication Dose Route Frequency Provider Last Rate Last Dose  . 0.9 %  sodium chloride infusion  500 mL Intravenous Once Armbruster, Carlota Raspberry, MD        PAST MEDICAL HISTORY: Past Medical History:  Diagnosis Date  . Allergy   . Arthritis   . Chest pain   . Depression   . Fatigue   . GERD (gastroesophageal reflux disease)   . Hyperlipidemia   . Hypertension   . Joint pain   . Obesity   . Osteopenia   . Palpitation   . Sleep apnea    uses CPAP   . SOB (shortness of breath)   . Urinary incontinence  PAST SURGICAL HISTORY: Past Surgical History:  Procedure Laterality Date  . ABDOMINAL HYSTERECTOMY  08/10/2000   TAH/RSO  . BREAST SURGERY     Biopsy-Benign  . COLOSTOMY  2000   Dr.Patterson  . KNEE ARTHROSCOPY     left knee  . LAPAROSCOPIC CHOLECYSTECTOMY  2010  . LAPAROTOMY  1990   Fibroid removed-ovarian cyst   . RHINOPLASTY      SOCIAL HISTORY: Social History   Tobacco Use  . Smoking status: Former Smoker    Last attempt to quit: 08/15/2007    Years since quitting: 10.8  . Smokeless tobacco: Never Used  Substance Use Topics  . Alcohol use: Yes    Alcohol/week: 3.0 standard drinks    Types: 3 Standard drinks or equivalent per week    Comment: 3 drinks per week per pt   . Drug use: No    FAMILY HISTORY: Family  History  Problem Relation Age of Onset  . Breast cancer Mother        Age 58  . Hypertension Mother   . Depression Mother   . Hypertension Father   . Arthritis Father   . Heart disease Father   . Cancer Father        Spleen  . Sleep apnea Father   . Heart disease Maternal Grandfather   . Colon cancer Paternal Grandfather        Colon cancer  . Esophageal cancer Neg Hx   . Stomach cancer Neg Hx     ROS: Review of Systems  Constitutional: Positive for weight loss.  Cardiovascular: Negative for chest pain.  Gastrointestinal: Negative for nausea and vomiting.  Genitourinary: Negative for frequency.  Musculoskeletal:       Negative muscle weakness  Neurological: Negative for headaches.  Endo/Heme/Allergies: Negative for polydipsia.    PHYSICAL EXAM: Blood pressure (!) 150/73, pulse 69, temperature (!) 97.4 F (36.3 C), temperature source Oral, height 5\' 6"  (1.676 m), weight 243 lb (110.2 kg), SpO2 98 %. Body mass index is 39.22 kg/m. Physical Exam  Constitutional: She is oriented to person, place, and time. She appears well-developed and well-nourished.  Cardiovascular: Normal rate.  Pulmonary/Chest: Effort normal.  Musculoskeletal: Normal range of motion.  Neurological: She is oriented to person, place, and time.  Skin: Skin is warm and dry.  Psychiatric: She has a normal mood and affect. Her behavior is normal.  Vitals reviewed.   RECENT LABS AND TESTS: BMET    Component Value Date/Time   NA 143 05/28/2018 0950   K 4.3 05/28/2018 0950   CL 104 05/28/2018 0950   CO2 23 05/28/2018 0950   GLUCOSE 86 05/28/2018 0950   GLUCOSE 95 06/08/2016 1353   BUN 23 05/28/2018 0950   CREATININE 0.71 05/28/2018 0950   CREATININE 0.81 06/08/2016 1353   CALCIUM 9.5 05/28/2018 0950   GFRNONAA 92 05/28/2018 0950   GFRAA 106 05/28/2018 0950   Lab Results  Component Value Date   HGBA1C 5.5 05/28/2018   HGBA1C 5.8 (H) 02/06/2018   Lab Results  Component Value Date   INSULIN  6.2 05/28/2018   INSULIN 8.7 02/06/2018   CBC    Component Value Date/Time   WBC 4.8 05/28/2018 0950   WBC 5.0 06/08/2016 1353   RBC 4.63 05/28/2018 0950   RBC 4.94 06/08/2016 1353   HGB 13.5 05/28/2018 0950   HGB 14.3 04/02/2014 0937   HCT 40.4 05/28/2018 0950   PLT 263 06/08/2016 1353   MCV 87 05/28/2018 0950  MCH 29.2 05/28/2018 0950   MCH 27.5 06/08/2016 1353   MCHC 33.4 05/28/2018 0950   MCHC 31.2 (L) 06/08/2016 1353   RDW 13.4 05/28/2018 0950   LYMPHSABS 1.1 05/28/2018 0950   MONOABS 450 06/08/2016 1353   EOSABS 0.1 05/28/2018 0950   BASOSABS 0.1 05/28/2018 0950   Iron/TIBC/Ferritin/ %Sat No results found for: IRON, TIBC, FERRITIN, IRONPCTSAT Lipid Panel     Component Value Date/Time   CHOL 200 (H) 05/28/2018 0950   TRIG 78 05/28/2018 0950   HDL 55 05/28/2018 0950   CHOLHDL 3.1 09/27/2017 1404   CHOLHDL 3.4 06/08/2016 1353   VLDL 19 06/08/2016 1353   LDLCALC 129 (H) 05/28/2018 0950   Hepatic Function Panel     Component Value Date/Time   PROT 6.5 05/28/2018 0950   ALBUMIN 4.4 05/28/2018 0950   AST 11 05/28/2018 0950   ALT 10 05/28/2018 0950   ALKPHOS 97 05/28/2018 0950   BILITOT 0.3 05/28/2018 0950      Component Value Date/Time   TSH 3.430 05/28/2018 0950   TSH 5.100 (H) 02/06/2018 0941   TSH 3.450 09/27/2017 1404   Results for BESSY, REANEY "[name]" (MRN [id number]) as of 07/03/2018 08:28  Ref. Range 05/28/2018 09:50  Vitamin D, 25-Hydroxy Latest Ref Range: 30.0 - 100.0 ng/mL 47.0   ASSESSMENT AND PLAN: Essential hypertension - Plan: losartan (COZAAR) 100 MG tablet  Vitamin D deficiency  At risk for diabetes mellitus  Class 2 severe obesity with serious comorbidity and body mass index (BMI) of 39.0 to 39.9 in adult, unspecified obesity type ([place])  PLAN:  Hypertension We discussed sodium restriction, working on healthy weight loss, and a regular exercise program as the means to achieve improved blood pressure control. Pansie agreed with  this plan and agreed to follow up as directed. We will continue to monitor her blood pressure as well as her progress with the above lifestyle modifications. Cheryl [name] agrees to increase losartan to 100 mg q daily #30 with no refills. She will watch for signs of hypotension as she continues her lifestyle modifications. We will recheck blood pressure at next visit. Cheryl [name] agrees to follow up with our clinic in 3 weeks.  Vitamin D Deficiency Cheryl [name] was informed that low vitamin D levels contributes to fatigue and are associated with obesity, breast, and colon cancer. Cheryl [name] agrees to continue taking OTC Vit D and will follow up for routine testing of vitamin D, at least 2-3 times per year. She was informed of the risk of over-replacement of vitamin D and agrees to not increase her dose unless she discusses this with [name] first. We will recheck labs in 2 months. Cheryl [name] agrees to follow up with our clinic in 3 weeks.  Diabetes risk counselling Cheryl [name] was given extended (15 minutes) diabetes prevention counseling today. She is 62 y.o. female and has risk factors for diabetes including obesity. We discussed intensive lifestyle modifications today with an emphasis on weight loss as well as increasing exercise and decreasing simple carbohydrates in her diet.  Obesity Cheryl [name] is currently in the action stage of change. As such, her goal is to continue with weight loss efforts She has agreed to go back to follow the Category 2 plan Cheryl [name] has been instructed to work up to a goal of 150 minutes of combined cardio and strengthening exercise per week for weight loss and overall health benefits. We discussed the following Behavioral Modification Strategies today: increasing lean protein intake, decreasing simple carbohydrates , work on meal planning and  easy cooking plans, holiday eating strategies  and emotional eating strategies   Cheryl [name] has agreed to follow up with our clinic in 3 weeks. She was informed of the  importance of frequent follow up visits to maximize her success with intensive lifestyle modifications for her multiple health conditions.   OBESITY BEHAVIORAL INTERVENTION VISIT  Today's visit was # 9   Starting weight: 281 lbs Starting date: 02/06/18 Today's weight : 243 lbs Today's date: 07/01/2018 Total lbs lost to date: 40    ASK: We discussed the diagnosis of obesity with Cheryl [name] today and Cheryl [name] agreed to give [name] permission to discuss obesity behavioral modification therapy today.  ASSESS: Cheryl [name] has the diagnosis of obesity and her BMI today is 39.24 Cheryl [name] is in the action stage of change   ADVISE: Nylani was educated on the multiple health risks of obesity as well as the benefit of weight loss to improve her health. She was advised of the need for long term treatment and the importance of lifestyle modifications to improve her current health and to decrease her risk of future health problems.  AGREE: Multiple dietary modification options and treatment options were discussed and  Cheryl [name] agreed to follow the recommendations documented in the above note.  ARRANGE: Britlyn was educated on the importance of frequent visits to treat obesity as outlined per CMS and USPSTF guidelines and agreed to schedule her next follow up appointment today.  I, Trixie Dredge, am acting as transcriptionist for Dennard Nip, MD  I have reviewed the above documentation for accuracy and completeness, and I agree with the above. -Dennard Nip, MD

## 2018-07-08 ENCOUNTER — Other Ambulatory Visit (INDEPENDENT_AMBULATORY_CARE_PROVIDER_SITE_OTHER): Payer: Self-pay | Admitting: Bariatrics

## 2018-07-08 DIAGNOSIS — I10 Essential (primary) hypertension: Secondary | ICD-10-CM

## 2018-07-22 ENCOUNTER — Ambulatory Visit (INDEPENDENT_AMBULATORY_CARE_PROVIDER_SITE_OTHER): Payer: Federal, State, Local not specified - PPO | Admitting: Family Medicine

## 2018-07-22 VITALS — BP 120/82 | HR 69 | Ht 66.0 in | Wt 237.0 lb

## 2018-07-22 DIAGNOSIS — R7303 Prediabetes: Secondary | ICD-10-CM

## 2018-07-22 DIAGNOSIS — Z6838 Body mass index (BMI) 38.0-38.9, adult: Secondary | ICD-10-CM

## 2018-07-23 NOTE — Progress Notes (Signed)
Office: [phone]  /  Fax: [phone]   HPI:   Chief Complaint: OBESITY Cheryl [name] is here to discuss her progress with her obesity treatment plan. She is on the  keep a food journal with 1000 to 1200 calories and 70 grams of protein and is following her eating plan approximately 45 % of the time. She states she is exercising 0 minutes 0 times per week. Trany continues to do very well with weight loss even over Thanksgiving. Her hunger is controlled and she is doing well with meal planning and meeting her protein goals.  Her weight is 237 lb (107.5 kg) today and has had a weight loss of 6 pounds over a period of 3 weeks since her last visit. She has lost 44 lbs since starting treatment with [name].  Pre-Diabetes Cheryl [name] has a diagnosis of pre-diabetes based on her elevated Hgb A1c and was informed this puts her at greater risk of developing diabetes. She is stable on metformin and is doing well on her prescription diet plan. She continues to work on diet and exercise to decrease risk of diabetes. She denies nausea, vomiting, or hypoglycemia.  ALLERGIES: Allergies  Allergen Reactions  . Ace Inhibitors Cough  . Lisinopril Cough  . Neosporin [Neomycin-Bacitracin Zn-Polymyx] Rash  . Z-Pak [Azithromycin] Other (See Comments)    thrush    MEDICATIONS: Current Outpatient Medications on File Prior to Visit  Medication Sig Dispense Refill  . aspirin 81 MG chewable tablet Chew 81 mg by mouth daily.    . Cholecalciferol (VITAMIN D3) 1000 UNITS CAPS Take 1 capsule by mouth daily.     Marland Kitchen docusate sodium (COLACE) 100 MG capsule Take 100 mg by mouth daily.    . Loratadine (CLARITIN PO) Take 1 tablet by mouth daily.     Marland Kitchen losartan (COZAAR) 100 MG tablet Take 1 tablet (100 mg total) by mouth daily. 30 tablet 0  . meloxicam (MOBIC) 15 MG tablet Take 1 tablet by mouth at bedtime.  3  . metFORMIN (GLUCOPHAGE) 500 MG tablet TAKE 1 TABLET(500 MG) BY MOUTH TWICE DAILY WITH A MEAL (Patient taking  differently: 500 mg daily with breakfast. TAKE 1 TABLET(500 MG) BY MOUTH TWICE DAILY WITH A MEAL) 60 tablet 0  . nitroGLYCERIN (NITROSTAT) 0.4 MG SL tablet Place 1 tablet (0.4 mg total) under the tongue every 5 (five) minutes as needed for chest pain. 12 tablet 0  . UNABLE TO FIND C PAP for sleep apnea     . venlafaxine (EFFEXOR) 75 MG tablet Take 1 tablet (75 mg total) by mouth daily. 90 tablet 4   Current Facility-Administered Medications on File Prior to Visit  Medication Dose Route Frequency Provider Last Rate Last Dose  . 0.9 %  sodium chloride infusion  500 mL Intravenous Once Armbruster, Carlota Raspberry, MD        PAST MEDICAL HISTORY: Past Medical History:  Diagnosis Date  . Allergy   . Arthritis   . Chest pain   . Depression   . Fatigue   . GERD (gastroesophageal reflux disease)   . Hyperlipidemia   . Hypertension   . Joint pain   . Obesity   . Osteopenia   . Palpitation   . Sleep apnea    uses CPAP   . SOB (shortness of breath)   . Urinary incontinence     PAST SURGICAL HISTORY: Past Surgical History:  Procedure Laterality Date  . ABDOMINAL HYSTERECTOMY  08/10/2000   TAH/RSO  . BREAST SURGERY  Biopsy-Benign  . COLOSTOMY  2000   Dr.Patterson  . KNEE ARTHROSCOPY     left knee  . LAPAROSCOPIC CHOLECYSTECTOMY  2010  . LAPAROTOMY  1990   Fibroid removed-ovarian cyst   . RHINOPLASTY      SOCIAL HISTORY: Social History   Tobacco Use  . Smoking status: Former Smoker    Last attempt to quit: 08/15/2007    Years since quitting: 10.9  . Smokeless tobacco: Never Used  Substance Use Topics  . Alcohol use: Yes    Alcohol/week: 3.0 standard drinks    Types: 3 Standard drinks or equivalent per week    Comment: 3 drinks per week per pt   . Drug use: No    FAMILY HISTORY: Family History  Problem Relation Age of Onset  . Breast cancer Mother        Age 52  . Hypertension Mother   . Depression Mother   . Hypertension Father   . Arthritis Father   . Heart  disease Father   . Cancer Father        Spleen  . Sleep apnea Father   . Heart disease Maternal Grandfather   . Colon cancer Paternal Grandfather        Colon cancer  . Esophageal cancer Neg Hx   . Stomach cancer Neg Hx     ROS: Review of Systems  Constitutional: Positive for weight loss.  Gastrointestinal: Negative for nausea and vomiting.  Musculoskeletal:       Negative for muscle weakness.    PHYSICAL EXAM: Blood pressure 120/82, pulse 69, height 5\' 6"  (1.676 m), weight 237 lb (107.5 kg), SpO2 99 %. Body mass index is 38.25 kg/m. Physical Exam  Constitutional: She is oriented to person, place, and time. She appears well-developed and well-nourished.  Cardiovascular: Normal rate.  Pulmonary/Chest: Effort normal.  Musculoskeletal: Normal range of motion.  Neurological: She is oriented to person, place, and time.  Skin: Skin is warm and dry.  Psychiatric: She has a normal mood and affect. Her behavior is normal.  Vitals reviewed.   RECENT LABS AND TESTS: BMET    Component Value Date/Time   NA 143 05/28/2018 0950   K 4.3 05/28/2018 0950   CL 104 05/28/2018 0950   CO2 23 05/28/2018 0950   GLUCOSE 86 05/28/2018 0950   GLUCOSE 95 06/08/2016 1353   BUN 23 05/28/2018 0950   CREATININE 0.71 05/28/2018 0950   CREATININE 0.81 06/08/2016 1353   CALCIUM 9.5 05/28/2018 0950   GFRNONAA 92 05/28/2018 0950   GFRAA 106 05/28/2018 0950   Lab Results  Component Value Date   HGBA1C 5.5 05/28/2018   HGBA1C 5.8 (H) 02/06/2018   Lab Results  Component Value Date   INSULIN 6.2 05/28/2018   INSULIN 8.7 02/06/2018   CBC    Component Value Date/Time   WBC 4.8 05/28/2018 0950   WBC 5.0 06/08/2016 1353   RBC 4.63 05/28/2018 0950   RBC 4.94 06/08/2016 1353   HGB 13.5 05/28/2018 0950   HGB 14.3 04/02/2014 0937   HCT 40.4 05/28/2018 0950   PLT 263 06/08/2016 1353   MCV 87 05/28/2018 0950   MCH 29.2 05/28/2018 0950   MCH 27.5 06/08/2016 1353   MCHC 33.4 05/28/2018 0950    MCHC 31.2 (L) 06/08/2016 1353   RDW 13.4 05/28/2018 0950   LYMPHSABS 1.1 05/28/2018 0950   MONOABS 450 06/08/2016 1353   EOSABS 0.1 05/28/2018 0950   BASOSABS 0.1 05/28/2018 0950   Iron/TIBC/Ferritin/ %Sat  No results found for: IRON, TIBC, FERRITIN, IRONPCTSAT Lipid Panel     Component Value Date/Time   CHOL 200 (H) 05/28/2018 0950   TRIG 78 05/28/2018 0950   HDL 55 05/28/2018 0950   CHOLHDL 3.1 09/27/2017 1404   CHOLHDL 3.4 06/08/2016 1353   VLDL 19 06/08/2016 1353   LDLCALC 129 (H) 05/28/2018 0950   Hepatic Function Panel     Component Value Date/Time   PROT 6.5 05/28/2018 0950   ALBUMIN 4.4 05/28/2018 0950   AST 11 05/28/2018 0950   ALT 10 05/28/2018 0950   ALKPHOS 97 05/28/2018 0950   BILITOT 0.3 05/28/2018 0950      Component Value Date/Time   TSH 3.430 05/28/2018 0950   TSH 5.100 (H) 02/06/2018 0941   TSH 3.450 09/27/2017 1404   Results for DIAMONDS, LIPPARD "[name]" (MRN [id number]) as of 07/23/2018 14:49  Ref. Range 05/28/2018 09:50  Vitamin D, 25-Hydroxy Latest Ref Range: 30.0 - 100.0 ng/mL 47.0   ASSESSMENT AND PLAN: Prediabetes  Class 2 severe obesity with serious comorbidity and body mass index (BMI) of 38.0 to 38.9 in adult, unspecified obesity type [facility])  PLAN:  Pre-Diabetes Chazlyn will continue to work on weight loss, exercise, and decreasing simple carbohydrates in her diet to help decrease the risk of diabetes. She was informed that eating too many simple carbohydrates or too many calories at one sitting increases the likelihood of GI side effects. Thomasina agreed to continue her diet prescription and taking metformin for now and a prescription was not written today. We will recheck labs in 3 months and Gladis agreed to follow up with [name] as directed to monitor her progress in 2 weeks.  I spent > than 50% of the 15 minute visit on counseling as documented in the note.  Obesity Sarh is currently in the action stage of change. As such, her goal is to  continue with weight loss efforts. She has agreed to keep a food journal with 1000 to 1200 calories and 70+ grams of protein.  Sandar has been instructed to work up to a goal of 150 minutes of combined cardio and strengthening exercise per week for weight loss and overall health benefits. We discussed the following Behavioral Modification Strategies today: increasing lean protein intake, decreasing simple carbohydrates, increasing vegetables, and work on meal planning and easy cooking plans.  Abbigael has agreed to follow up with our clinic in 2 weeks. She was informed of the importance of frequent follow up visits to maximize her success with intensive lifestyle modifications for her multiple health conditions.   OBESITY BEHAVIORAL INTERVENTION VISIT  Today's visit was # 10   Starting weight: 281 lbs Starting date: 02/06/18 Today's weight : Weight: 237 lb (107.5 kg)  Today's date: 07/22/2018 Total lbs lost to date: 44 lbs  ASK: We discussed the diagnosis of obesity with Tally Due today and Radhika agreed to give [name] permission to discuss obesity behavioral modification therapy today.  ASSESS: Aleja has the diagnosis of obesity and her BMI today is 38.2. Tenley is in the action stage of change.   ADVISE: Josepha was educated on the multiple health risks of obesity as well as the benefit of weight loss to improve her health. She was advised of the need for long term treatment and the importance of lifestyle modifications to improve her current health and to decrease her risk of future health problems.  AGREE: Multiple dietary modification options and treatment options were discussed and Maecy agreed to follow  the recommendations documented in the above note.  ARRANGE: Harlo was educated on the importance of frequent visits to treat obesity as outlined per CMS and USPSTF guidelines and agreed to schedule her next follow up appointment today.  I, Marcille Blanco, am acting as  transcriptionist for Starlyn Skeans, MD  I have reviewed the above documentation for accuracy and completeness, and I agree with the above. -Dennard Nip, MD

## 2018-08-05 ENCOUNTER — Ambulatory Visit (INDEPENDENT_AMBULATORY_CARE_PROVIDER_SITE_OTHER): Payer: Federal, State, Local not specified - PPO | Admitting: Family Medicine

## 2018-08-05 ENCOUNTER — Encounter (INDEPENDENT_AMBULATORY_CARE_PROVIDER_SITE_OTHER): Payer: Self-pay | Admitting: Family Medicine

## 2018-08-05 VITALS — BP 119/79 | HR 71 | Temp 97.9°F | Ht 66.0 in | Wt 235.0 lb

## 2018-08-05 DIAGNOSIS — R7303 Prediabetes: Secondary | ICD-10-CM

## 2018-08-05 DIAGNOSIS — Z9189 Other specified personal risk factors, not elsewhere classified: Secondary | ICD-10-CM | POA: Diagnosis not present

## 2018-08-05 DIAGNOSIS — I10 Essential (primary) hypertension: Secondary | ICD-10-CM

## 2018-08-05 DIAGNOSIS — Z6838 Body mass index (BMI) 38.0-38.9, adult: Secondary | ICD-10-CM

## 2018-08-05 MED ORDER — METFORMIN HCL 500 MG PO TABS: ORAL_TABLET | ORAL | 0 refills | Status: DC

## 2018-08-05 MED ORDER — LOSARTAN POTASSIUM 100 MG PO TABS: 100.0000 mg | ORAL_TABLET | Freq: Every day | ORAL | 0 refills | Status: DC

## 2018-08-05 NOTE — Progress Notes (Signed)
Office: [phone]  /  Fax: [phone]   HPI:   Chief Complaint: OBESITY Cheryl [name] is here to discuss her progress with her obesity treatment plan. She is on the keep a food journal with 1000-1200 calories and 70+ grams of protein daily and is following her eating plan approximately 25 % of the time. She states she is exercising 0 minutes 0 times per week. Cheryl [name] continues to do well with weight loss even with extra temptations this month. She is being mindful and portion controlling and smarter food choices.  Her weight is 235 lb (106.6 kg) today and has had a weight loss of 2 pounds over a period of 2 weeks since her last visit. She has lost 46 lbs since starting treatment with [name].  Hypertension Cheryl [name] is a 62 y.o. female with hypertension. Cheryl [name]'s blood pressure is well controlled on medications. She denies chest pain, headaches, or lightheadedness. She is working weight loss to help control her blood pressure with the goal of decreasing her risk of heart attack and stroke.   Pre-Diabetes Cheryl [name] has a diagnosis of pre-diabetes based on her elevated Hgb A1c and was informed this puts her at greater risk of developing diabetes. She is stable on diet and metformin, and denies nausea, vomiting, or hypoglycemia. She continues to work on diet and exercise to decrease risk of diabetes.   At risk for diabetes Cheryl [name] is at higher than average risk for developing diabetes [name] to her obesity and pre-diabetes. She currently denies polyuria or polydipsia.  ASSESSMENT AND PLAN:  Essential hypertension - Plan: losartan (COZAAR) 100 MG tablet  Prediabetes - Plan: metFORMIN (GLUCOPHAGE) 500 MG tablet  At risk for diabetes mellitus  Class 2 severe obesity with serious comorbidity and body mass index (BMI) of 38.0 to 38.9 in adult, unspecified obesity type ([place])  PLAN:  Hypertension We discussed sodium restriction, working on healthy weight loss, and a regular exercise program as the  means to achieve improved blood pressure control. Cheryl [name] agreed with this plan and agreed to follow up as directed. We will continue to monitor her blood pressure as well as her progress with the above lifestyle modifications. Cheryl [name] agrees to continue taking losartan 100 mg qd #30 and we will refill for 1 month. She will watch for signs of hypotension as she continues her lifestyle modifications. Cheryl [name] agrees to follow up with our clinic in 3 to 4 weeks.  Pre-Diabetes Cheryl [name] will continue to work on weight loss, exercise, and decreasing simple carbohydrates in her diet to help decrease the risk of diabetes. We dicussed metformin including benefits and risks. She was informed that eating too many simple carbohydrates or too many calories at one sitting increases the likelihood of GI side effects. Cheryl [name] agrees to continue taking metformin 500 mg BID #60 and we will refill for 1 month. Cheryl [name] agrees to follow up with our clinic in 3 to 4 weeks as directed to monitor her progress.  Diabetes risk counselling Cheryl [name] was given extended (15 minutes) diabetes prevention counseling today. She is 62 y.o. female and has risk factors for diabetes including obesity and pre-diabetes. We discussed intensive lifestyle modifications today with an emphasis on weight loss as well as increasing exercise and decreasing simple carbohydrates in her diet.  Obesity Cheryl [name] is currently in the action stage of change. As such, her goal is to continue with weight loss efforts She has agreed to keep a food journal with 1000-1200 calories and 70+ grams of protein daily Cheryl [name] has  been instructed to work up to a goal of 150 minutes of combined cardio and strengthening exercise per week for weight loss and overall health benefits. We discussed the following Behavioral Modification Strategies today: increasing lean protein intake, decreasing simple carbohydrates, holiday eating strategies, celebration eating strategies   Cheryl [name] has  agreed to follow up with our clinic in 3 to 4 weeks. She was informed of the importance of frequent follow up visits to maximize her success with intensive lifestyle modifications for her multiple health conditions.  ALLERGIES: Allergies  Allergen Reactions  . Ace Inhibitors Cough  . Lisinopril Cough  . Neosporin [Neomycin-Bacitracin Zn-Polymyx] Rash  . Z-Pak [Azithromycin] Other (See Comments)    thrush    MEDICATIONS: Current Outpatient Medications on File Prior to Visit  Medication Sig Dispense Refill  . aspirin 81 MG chewable tablet Chew 81 mg by mouth daily.    . Cholecalciferol (VITAMIN D3) 1000 UNITS CAPS Take 1 capsule by mouth daily.     Marland Kitchen docusate sodium (COLACE) 100 MG capsule Take 100 mg by mouth daily.    . Loratadine (CLARITIN PO) Take 1 tablet by mouth daily.     . meloxicam (MOBIC) 15 MG tablet Take 1 tablet by mouth at bedtime.  3  . nitroGLYCERIN (NITROSTAT) 0.4 MG SL tablet Place 1 tablet (0.4 mg total) under the tongue every 5 (five) minutes as needed for chest pain. 12 tablet 0  . UNABLE TO FIND C PAP for sleep apnea     . venlafaxine (EFFEXOR) 75 MG tablet Take 1 tablet (75 mg total) by mouth daily. 90 tablet 4   Current Facility-Administered Medications on File Prior to Visit  Medication Dose Route Frequency Provider Last Rate Last Dose  . 0.9 %  sodium chloride infusion  500 mL Intravenous Once Armbruster, Carlota Raspberry, MD        PAST MEDICAL HISTORY: Past Medical History:  Diagnosis Date  . Allergy   . Arthritis   . Chest pain   . Depression   . Fatigue   . GERD (gastroesophageal reflux disease)   . Hyperlipidemia   . Hypertension   . Joint pain   . Obesity   . Osteopenia   . Palpitation   . Sleep apnea    uses CPAP   . SOB (shortness of breath)   . Urinary incontinence     PAST SURGICAL HISTORY: Past Surgical History:  Procedure Laterality Date  . ABDOMINAL HYSTERECTOMY  08/10/2000   TAH/RSO  . BREAST SURGERY     Biopsy-Benign  .  COLOSTOMY  2000   Dr.Patterson  . KNEE ARTHROSCOPY     left knee  . LAPAROSCOPIC CHOLECYSTECTOMY  2010  . LAPAROTOMY  1990   Fibroid removed-ovarian cyst   . RHINOPLASTY      SOCIAL HISTORY: Social History   Tobacco Use  . Smoking status: Former Smoker    Last attempt to quit: 08/15/2007    Years since quitting: 10.9  . Smokeless tobacco: Never Used  Substance Use Topics  . Alcohol use: Yes    Alcohol/week: 3.0 standard drinks    Types: 3 Standard drinks or equivalent per week    Comment: 3 drinks per week per pt   . Drug use: No    FAMILY HISTORY: Family History  Problem Relation Age of Onset  . Breast cancer Mother        Age 44  . Hypertension Mother   . Depression Mother   . Hypertension Father   .  Arthritis Father   . Heart disease Father   . Cancer Father        Spleen  . Sleep apnea Father   . Heart disease Maternal Grandfather   . Colon cancer Paternal Grandfather        Colon cancer  . Esophageal cancer Neg Hx   . Stomach cancer Neg Hx     ROS: Review of Systems  Constitutional: Positive for weight loss.  Cardiovascular: Negative for chest pain.  Gastrointestinal: Negative for nausea and vomiting.  Genitourinary: Negative for frequency.  Neurological: Negative for headaches.       Negative lightheadedness  Endo/Heme/Allergies: Negative for polydipsia.       Negative hypoglycemia    PHYSICAL EXAM: Blood pressure 119/79, pulse 71, temperature 97.9 F (36.6 C), temperature source Oral, height 5\' 6"  (1.676 m), weight 235 lb (106.6 kg), SpO2 96 %. Body mass index is 37.93 kg/m. Physical Exam Vitals signs reviewed.  Constitutional:      Appearance: Normal appearance. She is obese.  Cardiovascular:     Rate and Rhythm: Normal rate.     Pulses: Normal pulses.  Pulmonary:     Effort: Pulmonary effort is normal.  Musculoskeletal: Normal range of motion.  Skin:    General: Skin is warm and dry.  Neurological:     Mental Status: She is alert and  oriented to person, place, and time.  Psychiatric:        Mood and Affect: Mood normal.        Behavior: Behavior normal.     RECENT LABS AND TESTS: BMET    Component Value Date/Time   NA 143 05/28/2018 0950   K 4.3 05/28/2018 0950   CL 104 05/28/2018 0950   CO2 23 05/28/2018 0950   GLUCOSE 86 05/28/2018 0950   GLUCOSE 95 06/08/2016 1353   BUN 23 05/28/2018 0950   CREATININE 0.71 05/28/2018 0950   CREATININE 0.81 06/08/2016 1353   CALCIUM 9.5 05/28/2018 0950   GFRNONAA 92 05/28/2018 0950   GFRAA 106 05/28/2018 0950   Lab Results  Component Value Date   HGBA1C 5.5 05/28/2018   HGBA1C 5.8 (H) 02/06/2018   Lab Results  Component Value Date   INSULIN 6.2 05/28/2018   INSULIN 8.7 02/06/2018   CBC    Component Value Date/Time   WBC 4.8 05/28/2018 0950   WBC 5.0 06/08/2016 1353   RBC 4.63 05/28/2018 0950   RBC 4.94 06/08/2016 1353   HGB 13.5 05/28/2018 0950   HGB 14.3 04/02/2014 0937   HCT 40.4 05/28/2018 0950   PLT 263 06/08/2016 1353   MCV 87 05/28/2018 0950   MCH 29.2 05/28/2018 0950   MCH 27.5 06/08/2016 1353   MCHC 33.4 05/28/2018 0950   MCHC 31.2 (L) 06/08/2016 1353   RDW 13.4 05/28/2018 0950   LYMPHSABS 1.1 05/28/2018 0950   MONOABS 450 06/08/2016 1353   EOSABS 0.1 05/28/2018 0950   BASOSABS 0.1 05/28/2018 0950   Iron/TIBC/Ferritin/ %Sat No results found for: IRON, TIBC, FERRITIN, IRONPCTSAT Lipid Panel     Component Value Date/Time   CHOL 200 (H) 05/28/2018 0950   TRIG 78 05/28/2018 0950   HDL 55 05/28/2018 0950   CHOLHDL 3.1 09/27/2017 1404   CHOLHDL 3.4 06/08/2016 1353   VLDL 19 06/08/2016 1353   LDLCALC 129 (H) 05/28/2018 0950   Hepatic Function Panel     Component Value Date/Time   PROT 6.5 05/28/2018 0950   ALBUMIN 4.4 05/28/2018 0950   AST 11 05/28/2018  0950   ALT 10 05/28/2018 0950   ALKPHOS 97 05/28/2018 0950   BILITOT 0.3 05/28/2018 0950      Component Value Date/Time   TSH 3.430 05/28/2018 0950   TSH 5.100 (H) 02/06/2018  0941   TSH 3.450 09/27/2017 1404      OBESITY BEHAVIORAL INTERVENTION VISIT  Today's visit was # 11   Starting weight: 281 lbs Starting date: 02/06/18 Today's weight : 235 lbs Today's date: 08/05/2018 Total lbs lost to date: 70    ASK: We discussed the diagnosis of obesity with Cheryl [name] today and Cheryl [name] agreed to give [name] permission to discuss obesity behavioral modification therapy today.  ASSESS: Cheryl [name] has the diagnosis of obesity and her BMI today is 37.95 Cheryl [name] is in the action stage of change   ADVISE: Cheryl [name] was educated on the multiple health risks of obesity as well as the benefit of weight loss to improve her health. She was advised of the need for long term treatment and the importance of lifestyle modifications to improve her current health and to decrease her risk of future health problems.  AGREE: Multiple dietary modification options and treatment options were discussed and  Cheryl [name] agreed to follow the recommendations documented in the above note.  ARRANGE: Kalayna was educated on the importance of frequent visits to treat obesity as outlined per CMS and USPSTF guidelines and agreed to schedule her next follow up appointment today.  I, Trixie Dredge, am acting as transcriptionist for Dennard Nip, MD  I have reviewed the above documentation for accuracy and completeness, and I agree with the above. -Dennard Nip, MD

## 2018-08-27 ENCOUNTER — Other Ambulatory Visit (INDEPENDENT_AMBULATORY_CARE_PROVIDER_SITE_OTHER): Payer: Self-pay

## 2018-08-27 ENCOUNTER — Ambulatory Visit (INDEPENDENT_AMBULATORY_CARE_PROVIDER_SITE_OTHER): Payer: Federal, State, Local not specified - PPO | Admitting: Family Medicine

## 2018-08-27 ENCOUNTER — Encounter (INDEPENDENT_AMBULATORY_CARE_PROVIDER_SITE_OTHER): Payer: Self-pay | Admitting: Family Medicine

## 2018-08-27 VITALS — BP 135/71 | HR 73 | Temp 97.4°F | Ht 66.0 in | Wt 237.0 lb

## 2018-08-27 DIAGNOSIS — R7303 Prediabetes: Secondary | ICD-10-CM

## 2018-08-27 DIAGNOSIS — E7849 Other hyperlipidemia: Secondary | ICD-10-CM

## 2018-08-27 DIAGNOSIS — Z6838 Body mass index (BMI) 38.0-38.9, adult: Secondary | ICD-10-CM | POA: Diagnosis not present

## 2018-08-27 DIAGNOSIS — E559 Vitamin D deficiency, unspecified: Secondary | ICD-10-CM

## 2018-08-27 DIAGNOSIS — I10 Essential (primary) hypertension: Secondary | ICD-10-CM

## 2018-08-28 ENCOUNTER — Encounter (HOSPITAL_COMMUNITY): Payer: Self-pay | Admitting: Emergency Medicine

## 2018-08-28 ENCOUNTER — Ambulatory Visit (HOSPITAL_COMMUNITY)
Admission: EM | Admit: 2018-08-28 | Discharge: 2018-08-28 | Disposition: A | Discharge status: Home / Self Care | Payer: Federal, State, Local not specified - PPO | Attending: Family Medicine | Admitting: Family Medicine

## 2018-08-28 ENCOUNTER — Other Ambulatory Visit: Payer: Self-pay

## 2018-08-28 DIAGNOSIS — R42 Dizziness and giddiness: Secondary | ICD-10-CM

## 2018-08-28 LAB — POCT I-STAT, CHEM 8
BUN: 22 mg/dL (ref 8–23)
CREATININE: 0.7 mg/dL (ref 0.44–1.00)
Calcium, Ion: 1.2 mmol/L (ref 1.15–1.40)
Chloride: 104 mmol/L (ref 98–111)
Glucose, Bld: 93 mg/dL (ref 70–99)
HCT: 46 % (ref 36.0–46.0)
Hemoglobin: 15.6 g/dL — ABNORMAL HIGH (ref 12.0–15.0)
Potassium: 4.1 mmol/L (ref 3.5–5.1)
Sodium: 140 mmol/L (ref 135–145)
TCO2: 26 mmol/L (ref 22–32)

## 2018-08-28 NOTE — ED Provider Notes (Signed)
[place]    CSN: [id number] Arrival date & time: 08/28/18  1137     History   Chief Complaint Chief Complaint  Patient presents with  . Dizziness    HPI Cheryl [name] is a 63 y.o. female history of hypertension, OSA, osteopenia, GERD, hyperlipidemia presenting today for evaluation of dizziness.  Patient states that she has felt "swimmy headed".  Symptoms began yesterday and have persisted and today.  She initially thought this was from working yesterday and staring at a screen/spreadsheets all day.  She has had difficulty focusing and concentrating.  Denies vision changes, but has had difficulty focusing on the screen.  Denies eye pain or lack of vision.  Denies chest pain or shortness of breath.  Denies headache.  Has felt slightly nauseous, but denies vomiting.  Tolerating oral intake.  Has noticed her symptoms worsen with going from sitting to standing.  Patient is currently working on [employer] management with Dr. Leafy Ro.  She has been working on this since July, no new changes in diet or weight loss.  No recent medication changes.  Her blood pressure medicine was increased 1 month ago.  Denies any difficulty walking or talking.  Denies recently being sick.  Denies hearing changes, ear pain.  HPI  Past Medical History:  Diagnosis Date  . Allergy   . Arthritis   . Chest pain   . Depression   . Fatigue   . GERD (gastroesophageal reflux disease)   . Hyperlipidemia   . Hypertension   . Joint pain   . Obesity   . Osteopenia   . Palpitation   . Sleep apnea    uses CPAP   . SOB (shortness of breath)   . Urinary incontinence     Patient Active Problem List   Diagnosis Date Noted  . Prediabetes 05/30/2018  . Other fatigue 02/06/2018  . Shortness of breath on exertion 02/06/2018  . Essential hypertension 02/06/2018  . Sleep apnea   . Urinary incontinence   . HTN (hypertension) 01/05/2012  . Osteopenia   . Chest pain, central 01/06/2011  . Obesity      Past Surgical History:  Procedure Laterality Date  . ABDOMINAL HYSTERECTOMY  08/10/2000   TAH/RSO  . BREAST SURGERY     Biopsy-Benign  . COLOSTOMY  2000   Dr.Patterson  . KNEE ARTHROSCOPY     left knee  . LAPAROSCOPIC CHOLECYSTECTOMY  2010  . LAPAROTOMY  1990   Fibroid removed-ovarian cyst   . RHINOPLASTY      OB History    Gravida  0   Para      Term      Preterm      AB      Living        SAB      TAB      Ectopic      Multiple      Live Births               Home Medications    Prior to Admission medications   Medication Sig Start Date End Date Taking? Authorizing Provider  aspirin 81 MG chewable tablet Chew 81 mg by mouth daily.   Yes [provider]  Cholecalciferol (VITAMIN D3) 1000 UNITS CAPS Take 1 capsule by mouth daily.    Yes [provider]  docusate sodium (COLACE) 100 MG capsule Take 100 mg by mouth daily.   Yes [provider]  Loratadine (CLARITIN PO) Take 1  tablet by mouth daily.    Yes [provider]  losartan (COZAAR) 100 MG tablet Take 1 tablet (100 mg total) by mouth daily. 08/05/18  Yes Beasley, Caren D, MD  meloxicam (MOBIC) 15 MG tablet Take 1 tablet by mouth at bedtime. 09/10/17  Yes [provider]  metFORMIN (GLUCOPHAGE) 500 MG tablet TAKE 1 TABLET(500 MG) BY MOUTH TWICE DAILY WITH A MEAL 08/05/18  Yes Beasley, Caren D, MD  UNABLE TO FIND C PAP for sleep apnea    Yes [provider]  venlafaxine (EFFEXOR) 75 MG tablet Take 1 tablet (75 mg total) by mouth daily. 09/27/17  Yes Megan Salon, MD  nitroGLYCERIN (NITROSTAT) 0.4 MG SL tablet Place 1 tablet (0.4 mg total) under the tongue every 5 (five) minutes as needed for chest pain. 06/07/16   Shawnee Knapp, MD    Family History Family History  Problem Relation Age of Onset  . Breast cancer Mother        Age 60  . Hypertension Mother   . Depression Mother   . Hypertension Father   . Arthritis Father   . Heart disease  Father   . Cancer Father        Spleen  . Sleep apnea Father   . Heart disease Maternal Grandfather   . Colon cancer Paternal Grandfather        Colon cancer  . Esophageal cancer Neg Hx   . Stomach cancer Neg Hx     Social History Social History   Tobacco Use  . Smoking status: Former Smoker    Last attempt to quit: 08/15/2007    Years since quitting: 11.0  . Smokeless tobacco: Never Used  Substance Use Topics  . Alcohol use: Yes    Alcohol/week: 3.0 standard drinks    Types: 3 Standard drinks or equivalent per week    Comment: 3 drinks per week per pt   . Drug use: No     Allergies   Ace inhibitors; Lisinopril; Neosporin [neomycin-bacitracin zn-polymyx]; and Z-pak [azithromycin]   Review of Systems Review of Systems  Constitutional: Negative for fatigue and fever.  HENT: Negative for congestion, sinus pressure and sore throat.   Eyes: Negative for photophobia, pain and visual disturbance.  Respiratory: Negative for cough and shortness of breath.   Cardiovascular: Negative for chest pain.  Gastrointestinal: Positive for nausea. Negative for abdominal pain and vomiting.  Genitourinary: Negative for decreased urine volume and hematuria.  Musculoskeletal: Negative for myalgias, neck pain and neck stiffness.  Neurological: Positive for dizziness. Negative for syncope, facial asymmetry, speech difficulty, weakness, light-headedness, numbness and headaches.  Psychiatric/Behavioral: Positive for decreased concentration.     Physical Exam Triage Vital Signs ED Triage Vitals [08/28/18 1234]  Enc Vitals Group     BP (!) 174/96     Pulse Rate 67     Resp      Temp 97.7 F (36.5 C)     Temp Source Oral     SpO2 100 %     Weight      Height      Head Circumference      Peak Flow      Pain Score 0     Pain Loc      Pain Edu?      Excl. in [place]?    Orthostatic VS for the past 24 hrs:  BP- Lying Pulse- Lying BP- Sitting Pulse- Sitting  08/28/18 1318 137/74 64 158/71  68  Updated Vital Signs BP (!) 174/96 (BP Location: Left Arm)   Pulse 67   Temp 97.7 F (36.5 C) (Oral)   SpO2 100%  Blood pressure rechecked 150/68  Visual Acuity Right Eye Distance:   Left Eye Distance:   Bilateral Distance:    Right Eye Near:   Left Eye Near:    Bilateral Near:     Physical Exam Vitals signs and nursing note reviewed.  Constitutional:      General: She is not in acute distress.    Appearance: She is well-developed.  HENT:     Head: Normocephalic and atraumatic.     Ears:     Comments: Bilateral ears without tenderness to palpation of external auricle, tragus and mastoid, EAC's without erythema or swelling, TM's with good bony landmarks and cone of light. Non erythematous.    Mouth/Throat:     Comments: Oral mucosa pink and moist, no tonsillar enlargement or exudate. Posterior pharynx patent and nonerythematous, no uvula deviation or swelling. Normal phonation.  Eyes:     Conjunctiva/sclera: Conjunctivae normal.  Neck:     Musculoskeletal: Neck supple.  Cardiovascular:     Rate and Rhythm: Normal rate and regular rhythm.     Heart sounds: No murmur.  Pulmonary:     Effort: Pulmonary effort is normal. No respiratory distress.     Breath sounds: Normal breath sounds.     Comments: Breathing comfortably at rest, CTABL, no wheezing, rales or other adventitious sounds auscultated Abdominal:     Palpations: Abdomen is soft.     Tenderness: There is no abdominal tenderness.  Skin:    General: Skin is warm and dry.  Neurological:     General: No focal deficit present.     Mental Status: She is alert and oriented to person, place, and time. Mental status is at baseline.     Comments: Patient A&O x3, cranial nerves II-XII grossly intact, strength at shoulders, hips and knees 5/5, equal bilaterally, patellar reflex 2+ bilaterally. Normal RAM and heel to shin. Gait without abnormality.    Orthostatic VS for the past 24 hrs:  BP- Lying Pulse- Lying BP-  Sitting Pulse- Sitting  08/28/18 1318 137/74 64 158/71 68      UC Treatments / Results  Labs (all labs ordered are listed, but only abnormal results are displayed) Labs Reviewed  POCT I-STAT, CHEM 8 - Abnormal; Notable for the following components:      Result Value   Hemoglobin 15.6 (*)    All other components within normal limits  I-STAT CHEM 8, ED    EKG None  Radiology No results found.  Procedures Procedures (including critical care time)  Medications Ordered in UC Medications - No data to display  Initial Impression / Assessment and Plan / UC Course  I have reviewed the triage vital signs and the nursing notes.  Pertinent labs & imaging results that were available during my care of the patient were reviewed by me and considered in my medical decision making (see chart for details).     Patient with 1 to 2 days of dizziness.  EKG normal sinus rhythm.  No acute signs of ischemia or infarction.  I-STAT within normal limits.  Blood pressure does drop with moving from sitting to standing.  Exam without no focal deficits.  Will recommend oral hydration, drinking plenty of fluids.  This is general recommendations and including slow movements when transitioning from sitting to standing.  Continue to monitor,Discussed strict return precautions. Patient  verbalized understanding and is agreeable with plan.  Final Clinical Impressions(s) / UC Diagnoses   Final diagnoses:  Dizziness     Discharge Instructions     EKG normal Blood pressure improved,  continue to monitor Blood pressure did drop when going from sitting to standing please be sure to drink plenty of fluids, make slow movements Blood work normal  Follow up if symptoms worsening, persisting, developing chest pain, shortness or breath, vision changes, headache    ED Prescriptions    None     Controlled Substance Prescriptions [place] Controlled Substance Registry consulted? Not Applicable   Janith Lima, [place] 08/28/18 1413

## 2018-08-28 NOTE — Progress Notes (Signed)
Office: [phone]  /  Fax: [phone]   HPI:   Chief Complaint: OBESITY Cheryl [name] is here to discuss her progress with her obesity treatment plan. She is on the keep a food journal with 1000-1200 calories and 70+ grams of protein daily and is following her eating plan approximately 25 % of the time. She states she is exercising 0 minutes 0 times per week. Cheryl [name] has done well minimizing weight loss over the holidays and is ready to get back on track. She thinks she would do better going back to her original plan.  Her weight is 237 lb (107.5 kg) today and has gained 2 pounds since her last visit. She has lost 44 lbs since starting treatment with [name].  Pre-Diabetes Cheryl [name] has a diagnosis of pre-diabetes based on her elevated Hgb A1c and was informed this puts her at greater risk of developing diabetes. She is stable on metformin and denies nausea, vomiting, or hypoglycemia, but finds she is missing her 2nd dose frequently. She continues to work on diet and exercise to decrease risk of diabetes.   ASSESSMENT AND PLAN:  Prediabetes  Class 2 severe obesity with serious comorbidity and body mass index (BMI) of 38.0 to 38.9 in adult, unspecified obesity type [facility])  PLAN:  Pre-Diabetes Cheryl [name] will continue to work on weight loss, diet, exercise, and decreasing simple carbohydrates in her diet to help decrease the risk of diabetes. We dicussed metformin including benefits and risks. She was informed that eating too many simple carbohydrates or too many calories at one sitting increases the likelihood of GI side effects. Cheryl [name] agrees to continue taking metformin, she can change 2nd dose to dinner. We will recheck labs at next visit. Corinna agrees to follow up with our clinic in 2 to 3 weeks as directed to monitor her progress.  I spent > than 50% of the 15 minute visit on counseling as documented in the note.  Obesity Cheryl [name] is currently in the action stage of change. As such, her goal is to  continue with weight loss efforts She has agreed to change to follow the Category 2 plan Cheryl [name] has been instructed to work up to a goal of 150 minutes of combined cardio and strengthening exercise per week for weight loss and overall health benefits. We discussed the following Behavioral Modification Strategies today: increasing lean protein intake, decreasing simple carbohydrates  and emotional eating strategies   [name] has agreed to follow up with our clinic in 2 to 3 weeks. She was informed of the importance of frequent follow up visits to maximize her success with intensive lifestyle modifications for her multiple health conditions.  ALLERGIES: Allergies  Allergen Reactions  . Ace Inhibitors Cough  . Lisinopril Cough  . Neosporin [Neomycin-Bacitracin Zn-Polymyx] Rash  . Z-Pak [Azithromycin] Other (See Comments)    thrush    MEDICATIONS: Current Outpatient Medications on File Prior to Visit  Medication Sig Dispense Refill  . aspirin 81 MG chewable tablet Chew 81 mg by mouth daily.    . Cholecalciferol (VITAMIN D3) 1000 UNITS CAPS Take 1 capsule by mouth daily.     Cheryl [name] docusate sodium (COLACE) 100 MG capsule Take 100 mg by mouth daily.    . Loratadine (CLARITIN PO) Take 1 tablet by mouth daily.     Cheryl [name] losartan (COZAAR) 100 MG tablet Take 1 tablet (100 mg total) by mouth daily. 30 tablet 0  . meloxicam (MOBIC) 15 MG tablet Take 1 tablet by mouth at bedtime.  3  .  metFORMIN (GLUCOPHAGE) 500 MG tablet TAKE 1 TABLET(500 MG) BY MOUTH TWICE DAILY WITH A MEAL 60 tablet 0  . nitroGLYCERIN (NITROSTAT) 0.4 MG SL tablet Place 1 tablet (0.4 mg total) under the tongue every 5 (five) minutes as needed for chest pain. 12 tablet 0  . UNABLE TO FIND C PAP for sleep apnea     . venlafaxine (EFFEXOR) 75 MG tablet Take 1 tablet (75 mg total) by mouth daily. 90 tablet 4   Current Facility-Administered Medications on File Prior to Visit  Medication Dose Route Frequency Provider Last Rate Last Dose  .  0.9 %  sodium chloride infusion  500 mL Intravenous Once Armbruster, Carlota Raspberry, MD        PAST MEDICAL HISTORY: Past Medical History:  Diagnosis Date  . Allergy   . Arthritis   . Chest pain   . Depression   . Fatigue   . GERD (gastroesophageal reflux disease)   . Hyperlipidemia   . Hypertension   . Joint pain   . Obesity   . Osteopenia   . Palpitation   . Sleep apnea    uses CPAP   . SOB (shortness of breath)   . Urinary incontinence     PAST SURGICAL HISTORY: Past Surgical History:  Procedure Laterality Date  . ABDOMINAL HYSTERECTOMY  08/10/2000   TAH/RSO  . BREAST SURGERY     Biopsy-Benign  . COLOSTOMY  2000   Dr.Patterson  . KNEE ARTHROSCOPY     left knee  . LAPAROSCOPIC CHOLECYSTECTOMY  2010  . LAPAROTOMY  1990   Fibroid removed-ovarian cyst   . RHINOPLASTY      SOCIAL HISTORY: Social History   Tobacco Use  . Smoking status: Former Smoker    Last attempt to quit: 08/15/2007    Years since quitting: 11.0  . Smokeless tobacco: Never Used  Substance Use Topics  . Alcohol use: Yes    Alcohol/week: 3.0 standard drinks    Types: 3 Standard drinks or equivalent per week    Comment: 3 drinks per week per pt   . Drug use: No    FAMILY HISTORY: Family History  Problem Relation Age of Onset  . Breast cancer Mother        Age 54  . Hypertension Mother   . Depression Mother   . Hypertension Father   . Arthritis Father   . Heart disease Father   . Cancer Father        Spleen  . Sleep apnea Father   . Heart disease Maternal Grandfather   . Colon cancer Paternal Grandfather        Colon cancer  . Esophageal cancer Neg Hx   . Stomach cancer Neg Hx     ROS: Review of Systems  Constitutional: Negative for weight loss.  Gastrointestinal: Negative for nausea and vomiting.  Endo/Heme/Allergies:       Negative hypoglycemia    PHYSICAL EXAM: Blood pressure 135/71, pulse 73, temperature (!) 97.4 F (36.3 C), temperature source Oral, height 5\' 6"  (1.676  m), weight 237 lb (107.5 kg), SpO2 98 %. Body mass index is 38.25 kg/m. Physical Exam Vitals signs reviewed.  Constitutional:      Appearance: Normal appearance. She is obese.  Cardiovascular:     Rate and Rhythm: Normal rate.     Pulses: Normal pulses.  Pulmonary:     Effort: Pulmonary effort is normal.     Breath sounds: Normal breath sounds.  Musculoskeletal: Normal range of motion.  Skin:    General: Skin is warm and dry.  Neurological:     Mental Status: She is alert and oriented to person, place, and time.  Psychiatric:        Mood and Affect: Mood normal.        Behavior: Behavior normal.     RECENT LABS AND TESTS: BMET    Component Value Date/Time   NA 143 05/28/2018 0950   K 4.3 05/28/2018 0950   CL 104 05/28/2018 0950   CO2 23 05/28/2018 0950   GLUCOSE 86 05/28/2018 0950   GLUCOSE 95 06/08/2016 1353   BUN 23 05/28/2018 0950   CREATININE 0.71 05/28/2018 0950   CREATININE 0.81 06/08/2016 1353   CALCIUM 9.5 05/28/2018 0950   GFRNONAA 92 05/28/2018 0950   GFRAA 106 05/28/2018 0950   Lab Results  Component Value Date   HGBA1C 5.5 05/28/2018   HGBA1C 5.8 (H) 02/06/2018   Lab Results  Component Value Date   INSULIN 6.2 05/28/2018   INSULIN 8.7 02/06/2018   CBC    Component Value Date/Time   WBC 4.8 05/28/2018 0950   WBC 5.0 06/08/2016 1353   RBC 4.63 05/28/2018 0950   RBC 4.94 06/08/2016 1353   HGB 13.5 05/28/2018 0950   HGB 14.3 04/02/2014 0937   HCT 40.4 05/28/2018 0950   PLT 263 06/08/2016 1353   MCV 87 05/28/2018 0950   MCH 29.2 05/28/2018 0950   MCH 27.5 06/08/2016 1353   MCHC 33.4 05/28/2018 0950   MCHC 31.2 (L) 06/08/2016 1353   RDW 13.4 05/28/2018 0950   LYMPHSABS 1.1 05/28/2018 0950   MONOABS 450 06/08/2016 1353   EOSABS 0.1 05/28/2018 0950   BASOSABS 0.1 05/28/2018 0950   Iron/TIBC/Ferritin/ %Sat No results found for: IRON, TIBC, FERRITIN, IRONPCTSAT Lipid Panel     Component Value Date/Time   CHOL 200 (H) 05/28/2018 0950    TRIG 78 05/28/2018 0950   HDL 55 05/28/2018 0950   CHOLHDL 3.1 09/27/2017 1404   CHOLHDL 3.4 06/08/2016 1353   VLDL 19 06/08/2016 1353   LDLCALC 129 (H) 05/28/2018 0950   Hepatic Function Panel     Component Value Date/Time   PROT 6.5 05/28/2018 0950   ALBUMIN 4.4 05/28/2018 0950   AST 11 05/28/2018 0950   ALT 10 05/28/2018 0950   ALKPHOS 97 05/28/2018 0950   BILITOT 0.3 05/28/2018 0950      Component Value Date/Time   TSH 3.430 05/28/2018 0950   TSH 5.100 (H) 02/06/2018 0941   TSH 3.450 09/27/2017 1404      OBESITY BEHAVIORAL INTERVENTION VISIT  Today's visit was # 12   Starting weight: 281 lbs Starting date: 02/06/18 Today's weight : 237 lbs  Today's date: 08/27/2018 Total lbs lost to date: 48    ASK: We discussed the diagnosis of obesity with Tally Due today and Olin Hauser agreed to give [name] permission to discuss obesity behavioral modification therapy today.  ASSESS: Salvatore has the diagnosis of obesity and her BMI today is 38.27 Nazirah is in the action stage of change   ADVISE: Janeshia was educated on the multiple health risks of obesity as well as the benefit of weight loss to improve her health. She was advised of the need for long term treatment and the importance of lifestyle modifications to improve her current health and to decrease her risk of future health problems.  AGREE: Multiple dietary modification options and treatment options were discussed and  Livia agreed to follow the recommendations documented  in the above note.  ARRANGE: Airica was educated on the importance of frequent visits to treat obesity as outlined per CMS and USPSTF guidelines and agreed to schedule her next follow up appointment today.  I, Trixie Dredge, am acting as transcriptionist for Dennard Nip, MD  I have reviewed the above documentation for accuracy and completeness, and I agree with the above. -Dennard Nip, MD

## 2018-08-28 NOTE — ED Triage Notes (Signed)
Pt reports feeling dizzy yesterday and it has not gotten any better today.  She says she "just doesn't feel good at all".

## 2018-08-28 NOTE — Discharge Instructions (Signed)
EKG normal Blood pressure improved,  continue to monitor Blood pressure did drop when going from sitting to standing please be sure to drink plenty of fluids, make slow movements Blood work normal  Follow up if symptoms worsening, persisting, developing chest pain, shortness or breath, vision changes, headache

## 2018-09-03 ENCOUNTER — Encounter: Payer: Self-pay | Admitting: Obstetrics & Gynecology

## 2018-09-03 DIAGNOSIS — Z803 Family history of malignant neoplasm of breast: Secondary | ICD-10-CM | POA: Diagnosis not present

## 2018-09-03 DIAGNOSIS — Z1231 Encounter for screening mammogram for malignant neoplasm of breast: Secondary | ICD-10-CM | POA: Diagnosis not present

## 2018-09-05 ENCOUNTER — Encounter: Payer: Self-pay | Admitting: Obstetrics & Gynecology

## 2018-09-05 DIAGNOSIS — N6001 Solitary cyst of right breast: Secondary | ICD-10-CM | POA: Diagnosis not present

## 2018-09-05 DIAGNOSIS — N6315 Unspecified lump in the right breast, overlapping quadrants: Secondary | ICD-10-CM | POA: Diagnosis not present

## 2018-09-18 ENCOUNTER — Ambulatory Visit (INDEPENDENT_AMBULATORY_CARE_PROVIDER_SITE_OTHER): Payer: Federal, State, Local not specified - PPO | Admitting: Family Medicine

## 2018-09-18 ENCOUNTER — Encounter (INDEPENDENT_AMBULATORY_CARE_PROVIDER_SITE_OTHER): Payer: Self-pay | Admitting: Family Medicine

## 2018-09-18 VITALS — BP 122/86 | HR 69 | Temp 98.1°F | Ht 66.0 in | Wt 237.0 lb

## 2018-09-18 DIAGNOSIS — Z9189 Other specified personal risk factors, not elsewhere classified: Secondary | ICD-10-CM | POA: Diagnosis not present

## 2018-09-18 DIAGNOSIS — I10 Essential (primary) hypertension: Secondary | ICD-10-CM

## 2018-09-18 DIAGNOSIS — R7303 Prediabetes: Secondary | ICD-10-CM | POA: Diagnosis not present

## 2018-09-18 DIAGNOSIS — Z6838 Body mass index (BMI) 38.0-38.9, adult: Secondary | ICD-10-CM

## 2018-09-18 MED ORDER — LOSARTAN POTASSIUM 100 MG PO TABS: 100.0000 mg | ORAL_TABLET | Freq: Every day | ORAL | 0 refills | Status: DC

## 2018-09-18 MED ORDER — METFORMIN HCL 500 MG PO TABS: ORAL_TABLET | ORAL | 0 refills | Status: DC

## 2018-09-19 LAB — LIPID PANEL WITH LDL/HDL RATIO
Cholesterol, Total: 185 mg/dL (ref 100–199)
HDL: 58 mg/dL (ref >39)
LDL Calculated: 111 mg/dL — ABNORMAL HIGH (ref 0–99)
LDl/HDL Ratio: 1.9 ratio (ref 0.0–3.2)
Triglycerides: 79 mg/dL (ref 0–149)
VLDL Cholesterol Cal: 16 mg/dL (ref 5–40)

## 2018-09-19 LAB — COMPREHENSIVE METABOLIC PANEL
ALT: 24 IU/L (ref 0–32)
AST: 17 IU/L (ref 0–40)
Albumin/Globulin Ratio: 2.3 — ABNORMAL HIGH (ref 1.2–2.2)
Albumin: 4.4 g/dL (ref 3.8–4.8)
Alkaline Phosphatase: 84 IU/L (ref 39–117)
BUN/Creatinine Ratio: 23 (ref 12–28)
BUN: 17 mg/dL (ref 8–27)
Bilirubin Total: 0.5 mg/dL (ref 0.0–1.2)
CO2: 23 mmol/L (ref 20–29)
Calcium: 9.1 mg/dL (ref 8.7–10.3)
Chloride: 105 mmol/L (ref 96–106)
Creatinine, Ser: 0.73 mg/dL (ref 0.57–1.00)
GFR calc Af Amer: 102 mL/min/{1.73_m2} (ref >59)
GFR calc non Af Amer: 89 mL/min/{1.73_m2} (ref >59)
GLUCOSE: 84 mg/dL (ref 65–99)
Globulin, Total: 1.9 g/dL (ref 1.5–4.5)
Potassium: 4.8 mmol/L (ref 3.5–5.2)
Sodium: 144 mmol/L (ref 134–144)
TOTAL PROTEIN: 6.3 g/dL (ref 6.0–8.5)

## 2018-09-19 LAB — INSULIN, RANDOM: INSULIN: 4.5 u[IU]/mL (ref 2.6–24.9)

## 2018-09-19 LAB — VITAMIN D 25 HYDROXY (VIT D DEFICIENCY, FRACTURES): Vit D, 25-Hydroxy: 39.2 ng/mL (ref 30.0–100.0)

## 2018-09-19 LAB — HEMOGLOBIN A1C
ESTIMATED AVERAGE GLUCOSE: 108 mg/dL
HEMOGLOBIN A1C: 5.4 % (ref 4.8–5.6)

## 2018-09-19 NOTE — Progress Notes (Signed)
Office: [phone]  /  Fax: [phone]   HPI:   Chief Complaint: OBESITY Cheryl [name] is here to discuss her progress with her obesity treatment plan. She is keeping a food journal with 1200 calories and 70 grams of protein and is following her eating plan approximately 65 % of the time. She states she is exercising 0 minutes 0 times per week. Cheryl [name] has done well with maintaining weight, but is especially struggling with meal planning and prepping. She is working on increasing fiber in her diet.  Her weight is 237 lb (107.5 kg) today and has not lost weight since her last visit. She has lost 44 lbs since starting treatment with [name].  Hypertension Cheryl [name] is a 63 y.o. female with hypertension. Cheryl [name]'s blood pressure is currently stable on medications. She is working on diet and weight loss to help control her blood pressure with the goal of decreasing her risk of heart attack and stroke. Cheryl [name] denies chest pain or headaches.  Pre-Diabetes Cheryl [name] has a diagnosis of pre-diabetes based on her elevated Hgb A1c and was informed this puts her at greater risk of developing diabetes. She is taking metformin currently and improving on her diet. She continues to work on her diet and exercise to decrease risk of diabetes. She denies nausea, vomiting, or hypoglycemia.  At risk for diabetes Cheryl [name] is at higher than average risk for developing diabetes due to her pre-diabetes and obesity. She currently denies polyuria or polydipsia.  ASSESSMENT AND PLAN:  Essential hypertension - Plan: Lipid Panel With LDL/HDL Ratio, VITAMIN D 25 Hydroxy (Vit-D Deficiency, Fractures), losartan (COZAAR) 100 MG tablet  Prediabetes - Plan: Comprehensive metabolic panel, Hemoglobin A1c, Insulin, random, Lipid Panel With LDL/HDL Ratio, metFORMIN (GLUCOPHAGE) 500 MG tablet  At risk for diabetes mellitus  Class 2 severe obesity with serious comorbidity and body mass index (BMI) of 38.0 to 38.9 in adult, unspecified  obesity type ([facility])  PLAN:  Hypertension We discussed sodium restriction, working on healthy weight loss, and a regular exercise program as the means to achieve improved blood pressure control. We will continue to monitor her blood pressure as well as her progress with the above lifestyle modifications. She will continue her losartan 100mg  qd #30 with no refills and will watch for signs of hypotension as she continues her lifestyle modifications. Cheryl [name] agreed with this plan and agreed to follow up as directed in 3 to 4 weeks.  Pre-Diabetes Cheryl [name] will continue to work on weight loss, exercise, and decreasing simple carbohydrates in her diet to help decrease the risk of diabetes. She was informed that eating too many simple carbohydrates or too many calories at one sitting increases the likelihood of GI side effects. Cheryl [name] agreed to continue metformin 500mg  BID #60 with no refills and a prescription was written today. Cheryl [name] agreed to follow up with [name] as directed to monitor her progress.  Diabetes risk counseling Cheryl [name] was given extended (15 minutes) diabetes prevention counseling today. She is 63 y.o. female and has risk factors for diabetes including pre-diabetes and obesity. We discussed intensive lifestyle modifications today with an emphasis on weight loss as well as increasing exercise and decreasing simple carbohydrates in her diet.  Obesity Cheryl [name] is currently in the action stage of change. As such, her goal is to continue with weight loss efforts. She has agreed to keep a food journal with 1200 calories and 75+ grams of protein.  Cheryl [name] has been instructed to work up to a goal of 150 minutes of  combined cardio and strengthening exercise per week for weight loss and overall health benefits. We discussed the following Behavioral Modification Strategies today: increase H2O intake, increasing fiber rich foods, and work on meal planning and easy cooking plans  Cheryl [name] has agreed to follow up  with our clinic in 3 to 4 weeks. She was informed of the importance of frequent follow up visits to maximize her success with intensive lifestyle modifications for her multiple health conditions.  ALLERGIES: Allergies  Allergen Reactions  . Ace Inhibitors Cough  . Lisinopril Cough  . Neosporin [Neomycin-Bacitracin Zn-Polymyx] Rash  . Z-Pak [Azithromycin] Other (See Comments)    thrush    MEDICATIONS: Current Outpatient Medications on File Prior to Visit  Medication Sig Dispense Refill  . aspirin 81 MG chewable tablet Chew 81 mg by mouth daily.    . Cholecalciferol (VITAMIN D3) 1000 UNITS CAPS Take 1 capsule by mouth daily.     Marland Kitchen docusate sodium (COLACE) 100 MG capsule Take 100 mg by mouth daily.    . Loratadine (CLARITIN PO) Take 1 tablet by mouth daily.     . meloxicam (MOBIC) 15 MG tablet Take 1 tablet by mouth at bedtime.  3  . nitroGLYCERIN (NITROSTAT) 0.4 MG SL tablet Place 1 tablet (0.4 mg total) under the tongue every 5 (five) minutes as needed for chest pain. 12 tablet 0  . UNABLE TO FIND C PAP for sleep apnea     . venlafaxine (EFFEXOR) 75 MG tablet Take 1 tablet (75 mg total) by mouth daily. 90 tablet 4   Current Facility-Administered Medications on File Prior to Visit  Medication Dose Route Frequency Provider Last Rate Last Dose  . 0.9 %  sodium chloride infusion  500 mL Intravenous Once Armbruster, Carlota Raspberry, MD        PAST MEDICAL HISTORY: Past Medical History:  Diagnosis Date  . Allergy   . Arthritis   . Chest pain   . Depression   . Fatigue   . GERD (gastroesophageal reflux disease)   . Hyperlipidemia   . Hypertension   . Joint pain   . Obesity   . Osteopenia   . Palpitation   . Sleep apnea    uses CPAP   . SOB (shortness of breath)   . Urinary incontinence     PAST SURGICAL HISTORY: Past Surgical History:  Procedure Laterality Date  . ABDOMINAL HYSTERECTOMY  08/10/2000   TAH/RSO  . BREAST SURGERY     Biopsy-Benign  . COLOSTOMY  2000    Dr.Patterson  . KNEE ARTHROSCOPY     left knee  . LAPAROSCOPIC CHOLECYSTECTOMY  2010  . LAPAROTOMY  1990   Fibroid removed-ovarian cyst   . RHINOPLASTY      SOCIAL HISTORY: Social History   Tobacco Use  . Smoking status: Former Smoker    Last attempt to quit: 08/15/2007    Years since quitting: 11.1  . Smokeless tobacco: Never Used  Substance Use Topics  . Alcohol use: Yes    Alcohol/week: 3.0 standard drinks    Types: 3 Standard drinks or equivalent per week    Comment: 3 drinks per week per pt   . Drug use: No    FAMILY HISTORY: Family History  Problem Relation Age of Onset  . Breast cancer Mother        Age 63  . Hypertension Mother   . Depression Mother   . Hypertension Father   . Arthritis Father   . Heart disease Father   .  Cancer Father        Spleen  . Sleep apnea Father   . Heart disease Maternal Grandfather   . Colon cancer Paternal Grandfather        Colon cancer  . Esophageal cancer Neg Hx   . Stomach cancer Neg Hx     ROS: Review of Systems  Constitutional: Negative for weight loss.  Cardiovascular: Negative for chest pain.  Gastrointestinal: Negative for nausea and vomiting.  Genitourinary:       Negative for polyuria.  Neurological: Negative for headaches.  Endo/Heme/Allergies: Negative for polydipsia.       Negative for hypoglycemia.    PHYSICAL EXAM: Blood pressure 122/86, pulse 69, temperature 98.1 F (36.7 C), temperature source Oral, height 5\' 6"  (1.676 m), weight 237 lb (107.5 kg), SpO2 99 %. Body mass index is 38.25 kg/m. Physical Exam Vitals signs reviewed.  Constitutional:      Appearance: Normal appearance. She is obese.  Cardiovascular:     Rate and Rhythm: Normal rate.  Pulmonary:     Effort: Pulmonary effort is normal.  Musculoskeletal: Normal range of motion.  Skin:    General: Skin is warm and dry.  Neurological:     Mental Status: She is alert and oriented to person, place, and time.  Psychiatric:        Mood and  Affect: Mood normal.        Behavior: Behavior normal.     RECENT LABS AND TESTS: BMET    Component Value Date/Time   NA 144 09/18/2018 1225   K 4.8 09/18/2018 1225   CL 105 09/18/2018 1225   CO2 23 09/18/2018 1225   GLUCOSE 84 09/18/2018 1225   GLUCOSE 93 08/28/2018 1333   BUN 17 09/18/2018 1225   CREATININE 0.73 09/18/2018 1225   CREATININE 0.81 06/08/2016 1353   CALCIUM 9.1 09/18/2018 1225   GFRNONAA 89 09/18/2018 1225   GFRAA 102 09/18/2018 1225   Lab Results  Component Value Date   HGBA1C 5.4 09/18/2018   HGBA1C 5.5 05/28/2018   HGBA1C 5.8 (H) 02/06/2018   Lab Results  Component Value Date   INSULIN 4.5 09/18/2018   INSULIN 6.2 05/28/2018   INSULIN 8.7 02/06/2018   CBC    Component Value Date/Time   WBC 4.8 05/28/2018 0950   WBC 5.0 06/08/2016 1353   RBC 4.63 05/28/2018 0950   RBC 4.94 06/08/2016 1353   HGB 15.6 (H) 08/28/2018 1333   HGB 13.5 05/28/2018 0950   HGB 14.3 04/02/2014 0937   HCT 46.0 08/28/2018 1333   HCT 40.4 05/28/2018 0950   PLT 263 06/08/2016 1353   MCV 87 05/28/2018 0950   MCH 29.2 05/28/2018 0950   MCH 27.5 06/08/2016 1353   MCHC 33.4 05/28/2018 0950   MCHC 31.2 (L) 06/08/2016 1353   RDW 13.4 05/28/2018 0950   LYMPHSABS 1.1 05/28/2018 0950   MONOABS 450 06/08/2016 1353   EOSABS 0.1 05/28/2018 0950   BASOSABS 0.1 05/28/2018 0950   Iron/TIBC/Ferritin/ %Sat No results found for: IRON, TIBC, FERRITIN, IRONPCTSAT Lipid Panel     Component Value Date/Time   CHOL 185 09/18/2018 1225   TRIG 79 09/18/2018 1225   HDL 58 09/18/2018 1225   CHOLHDL 3.1 09/27/2017 1404   CHOLHDL 3.4 06/08/2016 1353   VLDL 19 06/08/2016 1353   LDLCALC 111 (H) 09/18/2018 1225   Hepatic Function Panel     Component Value Date/Time   PROT 6.3 09/18/2018 1225   ALBUMIN 4.4 09/18/2018 1225  AST 17 09/18/2018 1225   ALT 24 09/18/2018 1225   ALKPHOS 84 09/18/2018 1225   BILITOT 0.5 09/18/2018 1225      Component Value Date/Time   TSH 3.430  05/28/2018 0950   TSH 5.100 (H) 02/06/2018 0941   TSH 3.450 09/27/2017 1404   Results for STARLENA, BEIL "[name]" (MRN [id number]) as of 09/19/2018 09:02  Ref. Range 09/18/2018 12:25  Vitamin D, 25-Hydroxy Latest Ref Range: 30.0 - 100.0 ng/mL 39.2    OBESITY BEHAVIORAL INTERVENTION VISIT  Today's visit was # 13   Starting weight: 281 lbs Starting date: 02/06/18 Today's weight : Weight: 237 lb (107.5 kg)  Today's date: 09/18/2018 Total lbs lost to date: 66  ASK: We discussed the diagnosis of obesity with Tally Due today and Khaylee agreed to give [name] permission to discuss obesity behavioral modification therapy today.  ASSESS: Kaylah has the diagnosis of obesity and her BMI today is 38.2. Francheska is in the action stage of change.   ADVISE: Keyry was educated on the multiple health risks of obesity as well as the benefit of weight loss to improve her health. She was advised of the need for long term treatment and the importance of lifestyle modifications to improve her current health and to decrease her risk of future health problems.  AGREE: Multiple dietary modification options and treatment options were discussed and Aleksis agreed to follow the recommendations documented in the above note.  ARRANGE: Thu was educated on the importance of frequent visits to treat obesity as outlined per CMS and USPSTF guidelines and agreed to schedule her next follow up appointment today.  IMarcille Blanco, CMA, am acting as transcriptionist for Starlyn Skeans, MD  I have reviewed the above documentation for accuracy and completeness, and I agree with the above. -Dennard Nip, MD

## 2018-10-09 ENCOUNTER — Encounter (INDEPENDENT_AMBULATORY_CARE_PROVIDER_SITE_OTHER): Payer: Self-pay | Admitting: Family Medicine

## 2018-10-09 ENCOUNTER — Ambulatory Visit (INDEPENDENT_AMBULATORY_CARE_PROVIDER_SITE_OTHER): Payer: Federal, State, Local not specified - PPO | Admitting: Family Medicine

## 2018-10-09 VITALS — BP 136/79 | HR 66 | Ht 66.0 in | Wt 230.0 lb

## 2018-10-09 DIAGNOSIS — Z9189 Other specified personal risk factors, not elsewhere classified: Secondary | ICD-10-CM | POA: Diagnosis not present

## 2018-10-09 DIAGNOSIS — I10 Essential (primary) hypertension: Secondary | ICD-10-CM | POA: Diagnosis not present

## 2018-10-09 DIAGNOSIS — Z6837 Body mass index (BMI) 37.0-37.9, adult: Secondary | ICD-10-CM

## 2018-10-09 MED ORDER — LOSARTAN POTASSIUM 100 MG PO TABS: 100.0000 mg | ORAL_TABLET | Freq: Every day | ORAL | 0 refills | Status: DC

## 2018-10-09 NOTE — Progress Notes (Signed)
Office: [phone]  /  Fax: [phone]   HPI:   Chief Complaint: OBESITY Cheryl [name] is here to discuss her progress with her obesity treatment plan. She is on the keep a food journal with 1200 calories and 75+ grams of protein daily and is following her eating plan approximately 70 % of the time. She states she is exercising 0 minutes 0 times per week. Cheryl [name] is doing better with weight loss. Cheryl [name] is doing better with meal planning and she is getting ready to start walking. Her weight is 230 lb (104.3 kg) today and has had a weight loss of 7 pounds over a period of 3 weeks since her last visit. She has lost 51 lbs since starting treatment with [name].  Hypertension Cheryl [name] is a 63 y.o. female with hypertension. Cheryl [name] denies chest pain or lightheadedness. She is working weight loss to help control her blood pressure with the goal of decreasing her risk of heart attack and stroke. Cheryl [name] blood pressure is stable on medications.  At risk for cardiovascular disease Cheryl [name] is at a higher than average risk for cardiovascular disease [name] to obesity and hypertension. She currently denies any chest pain.  ASSESSMENT AND PLAN:  Essential hypertension - Plan: losartan (COZAAR) 100 MG tablet  At risk for heart disease  Class 2 severe obesity with serious comorbidity and body mass index (BMI) of 37.0 to 37.9 in adult, unspecified obesity type ([place])  PLAN:  Hypertension We discussed sodium restriction, working on healthy weight loss, and a regular exercise program as the means to achieve improved blood pressure control. Cheryl [name] agreed with this plan and agreed to follow up as directed. We will continue to monitor her blood pressure as well as her progress with the above lifestyle modifications. She agreed to continue Losartan 100 mg qd #30 with no refills and will watch for signs of hypotension as she continues her lifestyle modifications.  Cardiovascular risk counseling Cheryl [name]  was given extended (15 minutes) coronary artery disease prevention counseling today. She is 63 y.o. female and has risk factors for heart disease including obesity and hypertension. We discussed intensive lifestyle modifications today with an emphasis on specific weight loss instructions and strategies. Pt was also informed of the importance of increasing exercise and decreasing saturated fats to help prevent heart disease.  Obesity Cheryl [name] is currently in the action stage of change. As such, her goal is to continue with weight loss efforts She has agreed to keep a food journal with 1200 calories and 70 grams of protein daily Cheryl [name] has been instructed to start walking for 15 minutes 3 times per week for weight loss and overall health benefits. We discussed the following Behavioral Modification Strategies today: increasing lean protein intake, decreasing simple carbohydrates  and work on meal planning and easy cooking plans  Cheryl [name] has agreed to follow up with our clinic in 3 weeks. She was informed of the importance of frequent follow up visits to maximize her success with intensive lifestyle modifications for her multiple health conditions.  ALLERGIES: Allergies  Allergen Reactions  . Ace Inhibitors Cough  . Lisinopril Cough  . Neosporin [Neomycin-Bacitracin Zn-Polymyx] Rash  . Z-Pak [Azithromycin] Other (See Comments)    thrush    MEDICATIONS: Current Outpatient Medications on File Prior to Visit  Medication Sig Dispense Refill  . aspirin 81 MG chewable tablet Chew 81 mg by mouth daily.    . Cholecalciferol (VITAMIN D3) 1000 UNITS CAPS Take 1 capsule by mouth daily.     Marland Kitchen  docusate sodium (COLACE) 100 MG capsule Take 100 mg by mouth daily.    . Loratadine (CLARITIN PO) Take 1 tablet by mouth daily.     . meloxicam (MOBIC) 15 MG tablet Take 1 tablet by mouth at bedtime.  3  . metFORMIN (GLUCOPHAGE) 500 MG tablet TAKE 1 TABLET(500 MG) BY MOUTH TWICE DAILY WITH A MEAL 60 tablet 0  .  nitroGLYCERIN (NITROSTAT) 0.4 MG SL tablet Place 1 tablet (0.4 mg total) under the tongue every 5 (five) minutes as needed for chest pain. 12 tablet 0  . UNABLE TO FIND C PAP for sleep apnea     . venlafaxine (EFFEXOR) 75 MG tablet Take 1 tablet (75 mg total) by mouth daily. 90 tablet 4   Current Facility-Administered Medications on File Prior to Visit  Medication Dose Route Frequency Provider Last Rate Last Dose  . 0.9 %  sodium chloride infusion  500 mL Intravenous Once Armbruster, Carlota Raspberry, MD        PAST MEDICAL HISTORY: Past Medical History:  Diagnosis Date  . Allergy   . Arthritis   . Chest pain   . Depression   . Fatigue   . GERD (gastroesophageal reflux disease)   . Hyperlipidemia   . Hypertension   . Joint pain   . Obesity   . Osteopenia   . Palpitation   . Sleep apnea    uses CPAP   . SOB (shortness of breath)   . Urinary incontinence     PAST SURGICAL HISTORY: Past Surgical History:  Procedure Laterality Date  . ABDOMINAL HYSTERECTOMY  08/10/2000   TAH/RSO  . BREAST SURGERY     Biopsy-Benign  . COLOSTOMY  2000   Dr.Patterson  . KNEE ARTHROSCOPY     left knee  . LAPAROSCOPIC CHOLECYSTECTOMY  2010  . LAPAROTOMY  1990   Fibroid removed-ovarian cyst   . RHINOPLASTY      SOCIAL HISTORY: Social History   Tobacco Use  . Smoking status: Former Smoker    Last attempt to quit: 08/15/2007    Years since quitting: 11.1  . Smokeless tobacco: Never Used  Substance Use Topics  . Alcohol use: Yes    Alcohol/week: 3.0 standard drinks    Types: 3 Standard drinks or equivalent per week    Comment: 3 drinks per week per pt   . Drug use: No    FAMILY HISTORY: Family History  Problem Relation Age of Onset  . Breast cancer Mother        Age 52  . Hypertension Mother   . Depression Mother   . Hypertension Father   . Arthritis Father   . Heart disease Father   . Cancer Father        Spleen  . Sleep apnea Father   . Heart disease Maternal Grandfather   .  Colon cancer Paternal Grandfather        Colon cancer  . Esophageal cancer Neg Hx   . Stomach cancer Neg Hx     ROS: Review of Systems  Constitutional: Positive for weight loss.  Cardiovascular: Negative for chest pain.  Neurological:       Negative for lightheadedness    PHYSICAL EXAM: Blood pressure 136/79, pulse 66, height 5\' 6"  (1.676 m), weight 230 lb (104.3 kg), SpO2 99 %. Body mass index is 37.12 kg/m. Physical Exam Vitals signs reviewed.  Constitutional:      Appearance: Normal appearance. She is well-developed. She is obese.  Cardiovascular:  Rate and Rhythm: Normal rate.  Pulmonary:     Effort: Pulmonary effort is normal.  Musculoskeletal: Normal range of motion.  Skin:    General: Skin is warm and dry.  Neurological:     Mental Status: She is alert and oriented to person, place, and time.  Psychiatric:        Mood and Affect: Mood normal.        Behavior: Behavior normal.     RECENT LABS AND TESTS: BMET    Component Value Date/Time   NA 144 09/18/2018 1225   K 4.8 09/18/2018 1225   CL 105 09/18/2018 1225   CO2 23 09/18/2018 1225   GLUCOSE 84 09/18/2018 1225   GLUCOSE 93 08/28/2018 1333   BUN 17 09/18/2018 1225   CREATININE 0.73 09/18/2018 1225   CREATININE 0.81 06/08/2016 1353   CALCIUM 9.1 09/18/2018 1225   GFRNONAA 89 09/18/2018 1225   GFRAA 102 09/18/2018 1225   Lab Results  Component Value Date   HGBA1C 5.4 09/18/2018   HGBA1C 5.5 05/28/2018   HGBA1C 5.8 (H) 02/06/2018   Lab Results  Component Value Date   INSULIN 4.5 09/18/2018   INSULIN 6.2 05/28/2018   INSULIN 8.7 02/06/2018   CBC    Component Value Date/Time   WBC 4.8 05/28/2018 0950   WBC 5.0 06/08/2016 1353   RBC 4.63 05/28/2018 0950   RBC 4.94 06/08/2016 1353   HGB 15.6 (H) 08/28/2018 1333   HGB 13.5 05/28/2018 0950   HGB 14.3 04/02/2014 0937   HCT 46.0 08/28/2018 1333   HCT 40.4 05/28/2018 0950   PLT 263 06/08/2016 1353   MCV 87 05/28/2018 0950   MCH 29.2  05/28/2018 0950   MCH 27.5 06/08/2016 1353   MCHC 33.4 05/28/2018 0950   MCHC 31.2 (L) 06/08/2016 1353   RDW 13.4 05/28/2018 0950   LYMPHSABS 1.1 05/28/2018 0950   MONOABS 450 06/08/2016 1353   EOSABS 0.1 05/28/2018 0950   BASOSABS 0.1 05/28/2018 0950   Iron/TIBC/Ferritin/ %Sat No results found for: IRON, TIBC, FERRITIN, IRONPCTSAT Lipid Panel     Component Value Date/Time   CHOL 185 09/18/2018 1225   TRIG 79 09/18/2018 1225   HDL 58 09/18/2018 1225   CHOLHDL 3.1 09/27/2017 1404   CHOLHDL 3.4 06/08/2016 1353   VLDL 19 06/08/2016 1353   LDLCALC 111 (H) 09/18/2018 1225   Hepatic Function Panel     Component Value Date/Time   PROT 6.3 09/18/2018 1225   ALBUMIN 4.4 09/18/2018 1225   AST 17 09/18/2018 1225   ALT 24 09/18/2018 1225   ALKPHOS 84 09/18/2018 1225   BILITOT 0.5 09/18/2018 1225      Component Value Date/Time   TSH 3.430 05/28/2018 0950   TSH 5.100 (H) 02/06/2018 0941   TSH 3.450 09/27/2017 1404   Results for TRISH, MANCINELLI "[name]" (MRN [id number]) as of 10/09/2018 14:09  Ref. Range 09/18/2018 12:25  Vitamin D, 25-Hydroxy Latest Ref Range: 30.0 - 100.0 ng/mL 39.2    OBESITY BEHAVIORAL INTERVENTION VISIT  Today's visit was # 14   Starting weight: 281 lbs Starting date: 02/06/2018 Today's weight : 230 lbs  Today's date: 10/09/2018 Total lbs lost to date: 51    10/09/2018  Height 5\' 6"  (1.676 m)  Weight 230 lb (104.3 kg)  BMI (Calculated) 37.14  BLOOD PRESSURE - SYSTOLIC 412  BLOOD PRESSURE - DIASTOLIC 79   Body Fat % 87.8 %  Total Body Water (lbs) 82.2 lbs    ASK: We discussed  the diagnosis of obesity with Cheryl [name] today and Cheryl [name] agreed to give [name] permission to discuss obesity behavioral modification therapy today.  ASSESS: Cheryl [name] has the diagnosis of obesity and her BMI today is 37.14 Cheryl [name] is in the action stage of change   ADVISE: Cheryl [name] was educated on the multiple health risks of obesity as well as the benefit of weight loss to  improve her health. She was advised of the need for long term treatment and the importance of lifestyle modifications to improve her current health and to decrease her risk of future health problems.  AGREE: Multiple dietary modification options and treatment options were discussed and  Cheryl [name] agreed to follow the recommendations documented in the above note.  ARRANGE: Cheryl [name] was educated on the importance of frequent visits to treat obesity as outlined per CMS and USPSTF guidelines and agreed to schedule her next follow up appointment today.  I, Doreene Nest, am acting as transcriptionist for Dennard Nip, MD  I have reviewed the above documentation for accuracy and completeness, and I agree with the above. -Dennard Nip, MD

## 2018-10-30 ENCOUNTER — Encounter (INDEPENDENT_AMBULATORY_CARE_PROVIDER_SITE_OTHER): Payer: Self-pay | Admitting: Family Medicine

## 2018-10-30 ENCOUNTER — Ambulatory Visit (INDEPENDENT_AMBULATORY_CARE_PROVIDER_SITE_OTHER): Payer: Federal, State, Local not specified - PPO | Admitting: Family Medicine

## 2018-10-30 ENCOUNTER — Other Ambulatory Visit: Payer: Self-pay

## 2018-10-30 VITALS — BP 129/84 | HR 79 | Temp 98.4°F | Ht 66.0 in | Wt 227.0 lb

## 2018-10-30 DIAGNOSIS — Z6836 Body mass index (BMI) 36.0-36.9, adult: Secondary | ICD-10-CM | POA: Diagnosis not present

## 2018-10-30 DIAGNOSIS — I10 Essential (primary) hypertension: Secondary | ICD-10-CM | POA: Diagnosis not present

## 2018-10-30 DIAGNOSIS — Z9189 Other specified personal risk factors, not elsewhere classified: Secondary | ICD-10-CM

## 2018-10-30 DIAGNOSIS — Z711 Person with feared health complaint in whom no diagnosis is made: Secondary | ICD-10-CM | POA: Diagnosis not present

## 2018-10-30 DIAGNOSIS — R7303 Prediabetes: Secondary | ICD-10-CM | POA: Diagnosis not present

## 2018-10-30 MED ORDER — METFORMIN HCL 500 MG PO TABS: ORAL_TABLET | ORAL | 0 refills | Status: DC

## 2018-10-30 MED ORDER — LOSARTAN POTASSIUM 100 MG PO TABS: 100.0000 mg | ORAL_TABLET | Freq: Every day | ORAL | 0 refills | Status: DC

## 2018-10-30 NOTE — Progress Notes (Signed)
Office: [phone]  /  Fax: [phone]   HPI:   Chief Complaint: OBESITY Cheryl [name] is here to discuss her progress with her obesity treatment plan. She is on the food journal with 1200 calories and 70 grams of protein and is following her eating plan approximately 50 % of the time. She states she is walking 30 minutes 2 to 3 times per week. Cheryl [name] has done well with weight loss even though she notes struggling with emotional eating with the stress of caring for her elderly parents during the [place] outbreak.  Her weight is 227 lb (103 kg) today and has had a weight loss of 3 pounds over a period of 3 weeks since her last visit. She has lost 54 lbs since starting treatment with [name].  Hypertension Cheryl [name] is a 63 y.o. female with hypertension. Cheryl [name]'s blood pressure is currently well controlled. She is working on weight loss to help control her blood pressure with the goal of decreasing her risk of heart attack and stroke. Cheryl [name] denies chest pain or dizziness.  At risk for cardiovascular disease Cheryl [name] is at a higher than average risk for cardiovascular disease due to hypertension and obesity. She currently denies any chest pain.  Pre-Diabetes Cheryl [name] has a diagnosis of pre-diabetes based on her elevated Hgb A1c and was informed this puts her at greater risk of developing diabetes. She continues to do well on metformin and with her diet prescription. Cheryl [name] continues to work on diet and exercise to decrease risk of diabetes. She denies nausea, vomiting, or hypoglycemia.   Worried Well Cheryl [name] has questions about how to tell the difference between a cold and COVID19.  ASSESSMENT AND PLAN:  Essential hypertension - Plan: losartan (COZAAR) 100 MG tablet  Prediabetes - Plan: metFORMIN (GLUCOPHAGE) 500 MG tablet  Worried well  At risk for heart disease  Class 2 severe obesity with serious comorbidity and body mass index (BMI) of 36.0 to 36.9 in adult, unspecified obesity type  (Cheryl [name])  PLAN:  Hypertension We discussed sodium restriction, working on healthy weight loss, and a regular exercise program as the means to achieve improved blood pressure control. We will continue to monitor her blood pressure as well as her progress with the above lifestyle modifications. She will continue her losartan 100 mg qd #30 with no refills and she will watch for signs of hypotension as she continues her lifestyle modifications. Cheryl [name] agreed with this plan and agreed to follow up as directed in 2 weeks.  Cardiovascular risk counseling Cheryl [name] was given extended (15 minutes) coronary artery disease prevention counseling today. She is 63 y.o. female and has risk factors for heart disease including hypertension and obesity. We discussed intensive lifestyle modifications today with an emphasis on specific weight loss instructions and strategies. Pt was also informed of the importance of increasing exercise and decreasing saturated fats to help prevent heart disease.  Pre-Diabetes Cheryl [name] will continue to work on weight loss, exercise, and decreasing simple carbohydrates in her diet to help decrease the risk of diabetes. She was informed that eating too many simple carbohydrates or too many calories at one sitting increases the likelihood of GI side effects. Cheryl [name] agreed to continue metformin 500 mg BID #60 with no refills and a prescription was written today. Cheryl [name] agreed to follow up with [name] as directed to monitor her progress.  Worried Well Cheryl [name] was educated on the need for social distancing, no travel, and hand washing/hand sanitizing. We discussed symptoms of COVID19 and we discussed the  possible need for sequestration and how to deal with this if it happens. Pt was offered guidance and reassurance. Cheryl [name] was advised to use her mychart app to have an E-visit if she thinks she may have [place] and to follow up with our office as directed.  Obesity Cheryl [name] is currently in the action stage  of change. As such, her goal is to continue with weight loss efforts. She has agreed to keep a food journal with 1200 calories and 70 grams of protein.  Cheryl [name] has been instructed to work up to a goal of 150 minutes of combined cardio and strengthening exercise per week for weight loss and overall health benefits. We discussed the following Behavioral Modification Strategies today: increasing lean protein intake, decreasing simple carbohydrates, work on meal planning and easy cooking plans, emotional eating strategies, keeping healthy foods in the home, better snacking choices, ways to avoid boredom eating, and ways to avoid night time snacking.  Cheryl [name] has agreed to follow up with our clinic in 2 weeks. She was informed of the importance of frequent follow up visits to maximize her success with intensive lifestyle modifications for her multiple health conditions.  ALLERGIES: Allergies  Allergen Reactions  . Ace Inhibitors Cough  . Lisinopril Cough  . Neosporin [Neomycin-Bacitracin Zn-Polymyx] Rash  . Z-Pak [Azithromycin] Other (See Comments)    thrush    MEDICATIONS: Current Outpatient Medications on File Prior to Visit  Medication Sig Dispense Refill  . aspirin 81 MG chewable tablet Chew 81 mg by mouth daily.    . Cholecalciferol (VITAMIN D3) 1000 UNITS CAPS Take 1 capsule by mouth daily.     Marland Kitchen docusate sodium (COLACE) 100 MG capsule Take 100 mg by mouth daily.    . Loratadine (CLARITIN PO) Take 1 tablet by mouth daily.     . meloxicam (MOBIC) 15 MG tablet Take 1 tablet by mouth at bedtime.  3  . nitroGLYCERIN (NITROSTAT) 0.4 MG SL tablet Place 1 tablet (0.4 mg total) under the tongue every 5 (five) minutes as needed for chest pain. 12 tablet 0  . UNABLE TO FIND C PAP for sleep apnea     . venlafaxine (EFFEXOR) 75 MG tablet Take 1 tablet (75 mg total) by mouth daily. 90 tablet 4   Current Facility-Administered Medications on File Prior to Visit  Medication Dose Route Frequency  Provider Last Rate Last Dose  . 0.9 %  sodium chloride infusion  500 mL Intravenous Once Armbruster, Carlota Raspberry, MD        PAST MEDICAL HISTORY: Past Medical History:  Diagnosis Date  . Allergy   . Arthritis   . Chest pain   . Depression   . Fatigue   . GERD (gastroesophageal reflux disease)   . Hyperlipidemia   . Hypertension   . Joint pain   . Obesity   . Osteopenia   . Palpitation   . Sleep apnea    uses CPAP   . SOB (shortness of breath)   . Urinary incontinence     PAST SURGICAL HISTORY: Past Surgical History:  Procedure Laterality Date  . ABDOMINAL HYSTERECTOMY  08/10/2000   TAH/RSO  . BREAST SURGERY     Biopsy-Benign  . COLOSTOMY  2000   Dr.Patterson  . KNEE ARTHROSCOPY     left knee  . LAPAROSCOPIC CHOLECYSTECTOMY  2010  . LAPAROTOMY  1990   Fibroid removed-ovarian cyst   . RHINOPLASTY      SOCIAL HISTORY: Social History   Tobacco Use  .  Smoking status: Former Smoker    Last attempt to quit: 08/15/2007    Years since quitting: 11.2  . Smokeless tobacco: Never Used  Substance Use Topics  . Alcohol use: Yes    Alcohol/week: 3.0 standard drinks    Types: 3 Standard drinks or equivalent per week    Comment: 3 drinks per week per pt   . Drug use: No    FAMILY HISTORY: Family History  Problem Relation Age of Onset  . Breast cancer Mother        Age 35  . Hypertension Mother   . Depression Mother   . Hypertension Father   . Arthritis Father   . Heart disease Father   . Cancer Father        Spleen  . Sleep apnea Father   . Heart disease Maternal Grandfather   . Colon cancer Paternal Grandfather        Colon cancer  . Esophageal cancer Neg Hx   . Stomach cancer Neg Hx    ROS: Review of Systems  Constitutional: Positive for weight loss.  Cardiovascular: Negative for chest pain.  Gastrointestinal: Negative for nausea and vomiting.  Neurological: Negative for dizziness.  Endo/Heme/Allergies:       Negative for hypoglycemia.   PHYSICAL  EXAM: Blood pressure 129/84, pulse 79, temperature 98.4 F (36.9 C), temperature source Oral, height 5\' 6"  (1.676 m), weight 227 lb (103 kg), SpO2 96 %. Body mass index is 36.64 kg/m. Physical Exam Vitals signs reviewed.  Constitutional:      Appearance: Normal appearance. She is obese.  Cardiovascular:     Rate and Rhythm: Normal rate.  Pulmonary:     Effort: Pulmonary effort is normal.  Musculoskeletal: Normal range of motion.  Skin:    General: Skin is warm and dry.  Neurological:     Mental Status: She is alert and oriented to person, place, and time.  Psychiatric:        Mood and Affect: Mood normal.        Behavior: Behavior normal.    RECENT LABS AND TESTS: BMET    Component Value Date/Time   NA 144 09/18/2018 1225   K 4.8 09/18/2018 1225   CL 105 09/18/2018 1225   CO2 23 09/18/2018 1225   GLUCOSE 84 09/18/2018 1225   GLUCOSE 93 08/28/2018 1333   BUN 17 09/18/2018 1225   CREATININE 0.73 09/18/2018 1225   CREATININE 0.81 06/08/2016 1353   CALCIUM 9.1 09/18/2018 1225   GFRNONAA 89 09/18/2018 1225   GFRAA 102 09/18/2018 1225   Lab Results  Component Value Date   HGBA1C 5.4 09/18/2018   HGBA1C 5.5 05/28/2018   HGBA1C 5.8 (H) 02/06/2018   Lab Results  Component Value Date   INSULIN 4.5 09/18/2018   INSULIN 6.2 05/28/2018   INSULIN 8.7 02/06/2018   CBC    Component Value Date/Time   WBC 4.8 05/28/2018 0950   WBC 5.0 06/08/2016 1353   RBC 4.63 05/28/2018 0950   RBC 4.94 06/08/2016 1353   HGB 15.6 (H) 08/28/2018 1333   HGB 13.5 05/28/2018 0950   HGB 14.3 04/02/2014 0937   HCT 46.0 08/28/2018 1333   HCT 40.4 05/28/2018 0950   PLT 263 06/08/2016 1353   MCV 87 05/28/2018 0950   MCH 29.2 05/28/2018 0950   MCH 27.5 06/08/2016 1353   MCHC 33.4 05/28/2018 0950   MCHC 31.2 (L) 06/08/2016 1353   RDW 13.4 05/28/2018 0950   LYMPHSABS 1.1 05/28/2018 0950  MONOABS 450 06/08/2016 1353   EOSABS 0.1 05/28/2018 0950   BASOSABS 0.1 05/28/2018 0950    Iron/TIBC/Ferritin/ %Sat No results found for: IRON, TIBC, FERRITIN, IRONPCTSAT Lipid Panel     Component Value Date/Time   CHOL 185 09/18/2018 1225   TRIG 79 09/18/2018 1225   HDL 58 09/18/2018 1225   CHOLHDL 3.1 09/27/2017 1404   CHOLHDL 3.4 06/08/2016 1353   VLDL 19 06/08/2016 1353   LDLCALC 111 (H) 09/18/2018 1225   Hepatic Function Panel     Component Value Date/Time   PROT 6.3 09/18/2018 1225   ALBUMIN 4.4 09/18/2018 1225   AST 17 09/18/2018 1225   ALT 24 09/18/2018 1225   ALKPHOS 84 09/18/2018 1225   BILITOT 0.5 09/18/2018 1225      Component Value Date/Time   TSH 3.430 05/28/2018 0950   TSH 5.100 (H) 02/06/2018 0941   TSH 3.450 09/27/2017 1404   Results for SYNIYAH, BOURNE "[name]" (MRN [id number]) as of 10/30/2018 08:21  Ref. Range 09/18/2018 12:25  Vitamin D, 25-Hydroxy Latest Ref Range: 30.0 - 100.0 ng/mL 39.2   OBESITY BEHAVIORAL INTERVENTION VISIT  Today's visit was # 15   Starting weight: 281 lbs Starting date: 02/06/18 Today's weight : Weight: 227 lb (103 kg)  Today's date: 10/30/2018 Total lbs lost to date: 54    10/30/2018  Height 5\' 6"  (1.676 m)  Weight 227 lb (103 kg)  BMI (Calculated) 36.66  BLOOD PRESSURE - SYSTOLIC 660  BLOOD PRESSURE - DIASTOLIC 84   Body Fat % 63.0 %  Total Body Water (lbs) 78.4 lbs   ASK: We discussed the diagnosis of obesity with Tally Due today and Camyah agreed to give [name] permission to discuss obesity behavioral modification therapy today.  ASSESS: Josetta has the diagnosis of obesity and her BMI today is 36.66. Deshunda is in the action stage of change.   ADVISE: Mashawn was educated on the multiple health risks of obesity as well as the benefit of weight loss to improve her health. She was advised of the need for long term treatment and the importance of lifestyle modifications to improve her current health and to decrease her risk of future health problems.  AGREE: Multiple dietary modification options and  treatment options were discussed and Sherline agreed to follow the recommendations documented in the above note.  ARRANGE: Lakeva was educated on the importance of frequent visits to treat obesity as outlined per CMS and USPSTF guidelines and agreed to schedule her next follow up appointment today.  IMarcille Blanco, CMA, am acting as transcriptionist for Starlyn Skeans, MD  I have reviewed the above documentation for accuracy and completeness, and I agree with the above. -Dennard Nip, MD

## 2018-11-05 ENCOUNTER — Encounter (INDEPENDENT_AMBULATORY_CARE_PROVIDER_SITE_OTHER): Payer: Self-pay

## 2018-11-13 ENCOUNTER — Ambulatory Visit (INDEPENDENT_AMBULATORY_CARE_PROVIDER_SITE_OTHER): Payer: Federal, State, Local not specified - PPO | Admitting: Family Medicine

## 2018-11-13 ENCOUNTER — Encounter (INDEPENDENT_AMBULATORY_CARE_PROVIDER_SITE_OTHER): Payer: Self-pay | Admitting: Family Medicine

## 2018-11-13 ENCOUNTER — Other Ambulatory Visit: Payer: Self-pay

## 2018-11-13 DIAGNOSIS — R7303 Prediabetes: Secondary | ICD-10-CM | POA: Diagnosis not present

## 2018-11-13 DIAGNOSIS — Z6836 Body mass index (BMI) 36.0-36.9, adult: Secondary | ICD-10-CM

## 2018-11-13 DIAGNOSIS — I10 Essential (primary) hypertension: Secondary | ICD-10-CM | POA: Diagnosis not present

## 2018-11-13 NOTE — Progress Notes (Signed)
Office: [phone]  /  Fax: [phone] TeleHealth Visit:  Cheryl [name] has verbally consented to this TeleHealth visit today. The patient is located at home, the provider is located at the [facility] office. The participants in this visit include the listed provider and patient. The visit was conducted today via telephone.  Cheryl [name] was unable to use realtime audiovisual technology today and the telehealth visit was conducted via telephone.  HPI:   Chief Complaint: OBESITY Cheryl [name] is here to discuss her progress with her obesity treatment plan. She is keeping a food journal with 1200 calories and 70+ grams of protein and is following her eating plan approximately 20 % of the time. She states she is exercising 0 minutes 0 times per week. Cheryl [name] was unable to use video and we held the visit by telephone. She is now working from home and struggling to meal plan. She is not journaling, but she is still trying to be mindful and portion control and make smarter choices. Cheryl [name] thinks that she has maintained her weight for the last 2 to 3 weeks.  We were unable to weigh the patient today for this TeleHealth visit. She feels as if she has maintained weight since her last visit. She has lost 54 lbs since starting treatment with [name].  Pre-Diabetes Cheryl [name] has a diagnosis of pre-diabetes based on her elevated Hgb A1c and was informed this puts her at greater risk of developing diabetes. She is stable on metformin currently and is working on diet and exercise to decrease risk of diabetes. She denies nausea, vomiting, or hypoglycemia.   Hypertension Cheryl [name] is a 63 y.o. female with hypertension. She does not check her blood pressure at home. Cheryl [name] thinks her blood pressure is still well controlled on losartan 100 mg, but she notes increased stress with COVID19 isolation. She is working on weight loss to help control her blood pressure with the goal of decreasing her risk of  heart attack and stroke. Cheryl [name] denies chest pain or headache.  ASSESSMENT AND PLAN:  Prediabetes  Essential hypertension  Class 2 severe obesity with serious comorbidity and body mass index (BMI) of 36.0 to 36.9 in adult, unspecified obesity type Cheryl [name])  PLAN:  Pre-Diabetes Cheryl [name] will continue to work on weight loss, exercise, and decreasing simple carbohydrates in her diet to help decrease the risk of diabetes. She was informed that eating too many simple carbohydrates or too many calories at one sitting increases the likelihood of GI side effects. Cheryl [name] agreed to continue metformin and to increase exercise to help prevent diabetes mellitus. Cheryl [name] agreed to follow up with [name] as directed to monitor her progress in 2 weeks.   Hypertension We discussed sodium restriction, working on healthy weight loss, and a regular exercise program as the means to achieve improved blood pressure control. We will continue to monitor her blood pressure as well as her progress with the above lifestyle modifications. She will continue her losartan as prescribed and to check her blood pressure at home every 1 to 2 days just to make sure. She will watch for signs of hypotension as she continues her lifestyle modifications. Cheryl [name] agreed with this plan and agreed to follow up as directed.  I spent > than 50% of the 25 minute visit on counseling as documented in the note.  Obesity Cheryl [name] is currently in the action stage of change. As such, her goal is to continue with weight loss efforts. She has agreed to keep a food  journal with 1200 calories and 70+ grams of protein.  Cheryl [name] has been instructed to start walking. We discussed the following Behavioral Modification Strategies today: increasing lean protein intake, increasing vegetables, ways to avoid boredom eating, better snacking choices, and ways to avoid night time snacking.  [name] has agreed to follow up with our clinic in 2 weeks. She was informed of the  importance of frequent follow up visits to maximize her success with intensive lifestyle modifications for her multiple health conditions.  ALLERGIES: Allergies  Allergen Reactions   Ace Inhibitors Cough   Lisinopril Cough   Neosporin [Neomycin-Bacitracin Zn-Polymyx] Rash   Z-Pak [Azithromycin] Other (See Comments)    thrush    MEDICATIONS: Current Outpatient Medications on File Prior to Visit  Medication Sig Dispense Refill   aspirin 81 MG chewable tablet Chew 81 mg by mouth daily.     Cholecalciferol (VITAMIN D3) 1000 UNITS CAPS Take 1 capsule by mouth daily.      docusate sodium (COLACE) 100 MG capsule Take 100 mg by mouth daily.     Loratadine (CLARITIN PO) Take 1 tablet by mouth daily.      losartan (COZAAR) 100 MG tablet Take 1 tablet (100 mg total) by mouth daily. 30 tablet 0   meloxicam (MOBIC) 15 MG tablet Take 1 tablet by mouth at bedtime.  3   metFORMIN (GLUCOPHAGE) 500 MG tablet TAKE 1 TABLET(500 MG) BY MOUTH TWICE DAILY WITH A MEAL 60 tablet 0   nitroGLYCERIN (NITROSTAT) 0.4 MG SL tablet Place 1 tablet (0.4 mg total) under the tongue every 5 (five) minutes as needed for chest pain. 12 tablet 0   UNABLE TO FIND C PAP for sleep apnea      venlafaxine (EFFEXOR) 75 MG tablet Take 1 tablet (75 mg total) by mouth daily. 90 tablet 4   Current Facility-Administered Medications on File Prior to Visit  Medication Dose Route Frequency Provider Last Rate Last Dose   0.9 %  sodium chloride infusion  500 mL Intravenous Once Armbruster, Carlota Raspberry, MD        PAST MEDICAL HISTORY: Past Medical History:  Diagnosis Date   Allergy    Arthritis    Chest pain    Depression    Fatigue    GERD (gastroesophageal reflux disease)    Hyperlipidemia    Hypertension    Joint pain    Obesity    Osteopenia    Palpitation    Sleep apnea    uses CPAP    SOB (shortness of breath)    Urinary incontinence     PAST SURGICAL HISTORY: Past Surgical History:    Procedure Laterality Date   ABDOMINAL HYSTERECTOMY  08/10/2000   TAH/RSO   BREAST SURGERY     Biopsy-Benign   COLOSTOMY  2000   Dr.Patterson   KNEE ARTHROSCOPY     left knee   LAPAROSCOPIC CHOLECYSTECTOMY  2010   LAPAROTOMY  1990   Fibroid removed-ovarian cyst    RHINOPLASTY      SOCIAL HISTORY: Social History   Tobacco Use   Smoking status: Former Smoker    Last attempt to quit: 08/15/2007    Years since quitting: 11.2   Smokeless tobacco: Never Used  Substance Use Topics   Alcohol use: Yes    Alcohol/week: 3.0 standard drinks    Types: 3 Standard drinks or equivalent per week    Comment: 3 drinks per week per pt    Drug use: No    FAMILY HISTORY: Family  History  Problem Relation Age of Onset   Breast cancer Mother        Age 53   Hypertension Mother    Depression Mother    Hypertension Father    Arthritis Father    Heart disease Father    Cancer Father        Spleen   Sleep apnea Father    Heart disease Maternal Grandfather    Colon cancer Paternal Grandfather        Colon cancer   Esophageal cancer Neg Hx    Stomach cancer Neg Hx     ROS: Review of Systems  Cardiovascular: Negative for chest pain.  Gastrointestinal: Negative for nausea and vomiting.  Neurological: Negative for headaches.  Endo/Heme/Allergies:       Negative for hypoglycemia.    PHYSICAL EXAM: Pt in no acute distress  RECENT LABS AND TESTS: BMET    Component Value Date/Time   NA 144 09/18/2018 1225   K 4.8 09/18/2018 1225   CL 105 09/18/2018 1225   CO2 23 09/18/2018 1225   GLUCOSE 84 09/18/2018 1225   GLUCOSE 93 08/28/2018 1333   BUN 17 09/18/2018 1225   CREATININE 0.73 09/18/2018 1225   CREATININE 0.81 06/08/2016 1353   CALCIUM 9.1 09/18/2018 1225   GFRNONAA 89 09/18/2018 1225   GFRAA 102 09/18/2018 1225   Lab Results  Component Value Date   HGBA1C 5.4 09/18/2018   HGBA1C 5.5 05/28/2018   HGBA1C 5.8 (H) 02/06/2018   Lab Results  Component  Value Date   INSULIN 4.5 09/18/2018   INSULIN 6.2 05/28/2018   INSULIN 8.7 02/06/2018   CBC    Component Value Date/Time   WBC 4.8 05/28/2018 0950   WBC 5.0 06/08/2016 1353   RBC 4.63 05/28/2018 0950   RBC 4.94 06/08/2016 1353   HGB 15.6 (H) 08/28/2018 1333   HGB 13.5 05/28/2018 0950   HGB 14.3 04/02/2014 0937   HCT 46.0 08/28/2018 1333   HCT 40.4 05/28/2018 0950   PLT 263 06/08/2016 1353   MCV 87 05/28/2018 0950   MCH 29.2 05/28/2018 0950   MCH 27.5 06/08/2016 1353   MCHC 33.4 05/28/2018 0950   MCHC 31.2 (L) 06/08/2016 1353   RDW 13.4 05/28/2018 0950   LYMPHSABS 1.1 05/28/2018 0950   MONOABS 450 06/08/2016 1353   EOSABS 0.1 05/28/2018 0950   BASOSABS 0.1 05/28/2018 0950   Iron/TIBC/Ferritin/ %Sat No results found for: IRON, TIBC, FERRITIN, IRONPCTSAT Lipid Panel     Component Value Date/Time   CHOL 185 09/18/2018 1225   TRIG 79 09/18/2018 1225   HDL 58 09/18/2018 1225   CHOLHDL 3.1 09/27/2017 1404   CHOLHDL 3.4 06/08/2016 1353   VLDL 19 06/08/2016 1353   LDLCALC 111 (H) 09/18/2018 1225   Hepatic Function Panel     Component Value Date/Time   PROT 6.3 09/18/2018 1225   ALBUMIN 4.4 09/18/2018 1225   AST 17 09/18/2018 1225   ALT 24 09/18/2018 1225   ALKPHOS 84 09/18/2018 1225   BILITOT 0.5 09/18/2018 1225      Component Value Date/Time   TSH 3.430 05/28/2018 0950   TSH 5.100 (H) 02/06/2018 0941   TSH 3.450 09/27/2017 1404   Results for LIZETH, BENCOSME "[name]" (MRN [id number]) as of 11/13/2018 14:48  Ref. Range 09/18/2018 12:25  Vitamin D, 25-Hydroxy Latest Ref Range: 30.0 - 100.0 ng/mL 39.2    I, Marcille Blanco, CMA, am acting as transcriptionist for Starlyn Skeans, MD I have reviewed the  above documentation for accuracy and completeness, and I agree with the above. -Dennard Nip, MD

## 2018-11-25 ENCOUNTER — Other Ambulatory Visit: Payer: Self-pay | Admitting: Obstetrics & Gynecology

## 2018-11-25 DIAGNOSIS — I10 Essential (primary) hypertension: Secondary | ICD-10-CM

## 2018-11-25 NOTE — Telephone Encounter (Signed)
Medication refill request: Venlafaxine Last AEX:  09/27/17 SM Next AEX: 12/17/18 Last MMG (if hormonal medication request): 08/20/17 BIRADS 2 benign/density b Refill authorized: Order pended #90 w/1 refill if authorized

## 2018-11-27 ENCOUNTER — Other Ambulatory Visit: Payer: Self-pay

## 2018-11-27 ENCOUNTER — Encounter (INDEPENDENT_AMBULATORY_CARE_PROVIDER_SITE_OTHER): Payer: Self-pay | Admitting: Family Medicine

## 2018-11-27 ENCOUNTER — Ambulatory Visit (INDEPENDENT_AMBULATORY_CARE_PROVIDER_SITE_OTHER): Payer: Federal, State, Local not specified - PPO | Admitting: Family Medicine

## 2018-11-27 DIAGNOSIS — I10 Essential (primary) hypertension: Secondary | ICD-10-CM

## 2018-11-27 DIAGNOSIS — F3289 Other specified depressive episodes: Secondary | ICD-10-CM

## 2018-11-27 DIAGNOSIS — Z6836 Body mass index (BMI) 36.0-36.9, adult: Secondary | ICD-10-CM

## 2018-11-27 MED ORDER — LOSARTAN POTASSIUM 100 MG PO TABS: 100.0000 mg | ORAL_TABLET | Freq: Every day | ORAL | 0 refills | Status: DC

## 2018-11-27 NOTE — Progress Notes (Signed)
Office: [phone]  /  Fax: [phone] TeleHealth Visit:  Cheryl [name] has verbally consented to this TeleHealth visit today. The patient is located at home, the provider is located at the [employer] [facility] office. The participants in this visit include the listed provider and patient. Tamiya was unable to use Webex today and the Telehealth visit was conducted via telephone.  HPI:   Chief Complaint: OBESITY Cheryl [name] is here to discuss her progress with her obesity treatment plan. She is keeping a food journal with 1200 calories and 70+ grams of protein and is following her eating plan approximately 25 % of the time. She states she is walking 30 minutes 3 times per week. Cheryl [name] feels that she has done well maintaining her weight. She is trying to stay active and avoid snacking for boredom. She has not done as well with meal planning and increasing protein and vegetables.  We were unable to weigh the patient today for this TeleHealth visit. She feels as if she has maintained weight since her last visit. She has lost 54 lbs since starting treatment with [name].  Hypertension Cheryl [name] is a 63 y.o. female with hypertension. [name]'s blood pressure have been currently well controlled on losartan, diet, and exercise. She is working on weight loss to help control her blood pressure with the goal of decreasing her risk of heart attack and stroke. Cyan denies chest pain, headache, and lightheadedness.  Depression with emotional eating behaviors Cheryl [name]'s mood is stable on Effexor and she is sleeping well. Cheryl [name] is handling isolation well and has found ways to keep herself occupied and upbeat. She is struggling with emotional eating and using food for comfort to the extent that it is negatively impacting her health. She often snacks when she is not hungry. Cheryl [name] sometimes feels she is out of control and then feels guilty that she made poor food choices. She has been working on behavior  modification techniques to help reduce her emotional eating and has been somewhat successful.  ASSESSMENT AND PLAN:  Essential hypertension - Plan: losartan (COZAAR) 100 MG tablet  Other depression - with emotional eating  Class 2 severe obesity with serious comorbidity and body mass index (BMI) of 36.0 to 36.9 in adult, unspecified obesity type ([place])  PLAN:  Hypertension We discussed sodium restriction, working on healthy weight loss, and a regular exercise program as the means to achieve improved blood pressure control. We will continue to monitor her blood pressure as well as her progress with the above lifestyle modifications. She will continue her losartan 100 mg qd #30 with no refills and will watch for signs of hypotension as she continues her lifestyle modifications. Dannika agreed with this plan and agreed to follow up as directed in 3 weeks.  Depression with Emotional Eating Behaviors We discussed behavior modification techniques today to help Hazley deal with her emotional eating and depression. She has agreed to take Effexor and we will continue to monitor her progress in 3 weeks.   Obesity Cheryl [name] is currently in the action stage of change. As such, her goal is to continue with weight loss efforts. She has agreed to keep a food journal with 1200 calories and 70+ grams of protein.  Cheryl [name] has been instructed to work up to a goal of 150 minutes of combined cardio and strengthening exercise per week for weight loss and overall health benefits. We discussed the following Behavioral Modification Strategies today: increasing lean protein intake, increasing vegetables, better snacking choices, and  work on meal planning and easy cooking plans.  Cheryl [name] has agreed to follow up with our clinic in 3 weeks. She was informed of the importance of frequent follow up visits to maximize her success with intensive lifestyle modifications for her multiple health conditions.  ALLERGIES: Allergies    Allergen Reactions   Ace Inhibitors Cough   Lisinopril Cough   Neosporin [Neomycin-Bacitracin Zn-Polymyx] Rash   Z-Pak [Azithromycin] Other (See Comments)    thrush    MEDICATIONS: Current Outpatient Medications on File Prior to Visit  Medication Sig Dispense Refill   aspirin 81 MG chewable tablet Chew 81 mg by mouth daily.     Cholecalciferol (VITAMIN D3) 1000 UNITS CAPS Take 1 capsule by mouth daily.      docusate sodium (COLACE) 100 MG capsule Take 100 mg by mouth daily.     Loratadine (CLARITIN PO) Take 1 tablet by mouth daily.      losartan (COZAAR) 100 MG tablet Take 1 tablet (100 mg total) by mouth daily. 30 tablet 0   meloxicam (MOBIC) 15 MG tablet Take 1 tablet by mouth at bedtime.  3   metFORMIN (GLUCOPHAGE) 500 MG tablet TAKE 1 TABLET(500 MG) BY MOUTH TWICE DAILY WITH A MEAL 60 tablet 0   nitroGLYCERIN (NITROSTAT) 0.4 MG SL tablet Place 1 tablet (0.4 mg total) under the tongue every 5 (five) minutes as needed for chest pain. 12 tablet 0   UNABLE TO FIND C PAP for sleep apnea      venlafaxine (EFFEXOR) 75 MG tablet TAKE 1 TABLET(75 MG) BY MOUTH DAILY 90 tablet 0   Current Facility-Administered Medications on File Prior to Visit  Medication Dose Route Frequency Provider Last Rate Last Dose   0.9 %  sodium chloride infusion  500 mL Intravenous Once Armbruster, Carlota Raspberry, MD        PAST MEDICAL HISTORY: Past Medical History:  Diagnosis Date   Allergy    Arthritis    Chest pain    Depression    Fatigue    GERD (gastroesophageal reflux disease)    Hyperlipidemia    Hypertension    Joint pain    Obesity    Osteopenia    Palpitation    Sleep apnea    uses CPAP    SOB (shortness of breath)    Urinary incontinence     PAST SURGICAL HISTORY: Past Surgical History:  Procedure Laterality Date   ABDOMINAL HYSTERECTOMY  08/10/2000   TAH/RSO   BREAST SURGERY     Biopsy-Benign   COLOSTOMY  2000   Dr.Patterson   KNEE ARTHROSCOPY      left knee   LAPAROSCOPIC CHOLECYSTECTOMY  2010   LAPAROTOMY  1990   Fibroid removed-ovarian cyst    RHINOPLASTY      SOCIAL HISTORY: Social History   Tobacco Use   Smoking status: Former Smoker    Last attempt to quit: 08/15/2007    Years since quitting: 11.2   Smokeless tobacco: Never Used  Substance Use Topics   Alcohol use: Yes    Alcohol/week: 3.0 standard drinks    Types: 3 Standard drinks or equivalent per week    Comment: 3 drinks per week per pt    Drug use: No    FAMILY HISTORY: Family History  Problem Relation Age of Onset   Breast cancer Mother        Age 85   Hypertension Mother    Depression Mother    Hypertension Father    Arthritis Father  Heart disease Father    Cancer Father        Spleen   Sleep apnea Father    Heart disease Maternal Grandfather    Colon cancer Paternal Grandfather        Colon cancer   Esophageal cancer Neg Hx    Stomach cancer Neg Hx     ROS: Review of Systems  Cardiovascular: Negative for chest pain.  Neurological: Negative for headaches.       Negative for lightheadedness.  Psychiatric/Behavioral: Positive for depression.    PHYSICAL EXAM: Pt in no acute distress  RECENT LABS AND TESTS: BMET    Component Value Date/Time   NA 144 09/18/2018 1225   K 4.8 09/18/2018 1225   CL 105 09/18/2018 1225   CO2 23 09/18/2018 1225   GLUCOSE 84 09/18/2018 1225   GLUCOSE 93 08/28/2018 1333   BUN 17 09/18/2018 1225   CREATININE 0.73 09/18/2018 1225   CREATININE 0.81 06/08/2016 1353   CALCIUM 9.1 09/18/2018 1225   GFRNONAA 89 09/18/2018 1225   GFRAA 102 09/18/2018 1225   Lab Results  Component Value Date   HGBA1C 5.4 09/18/2018   HGBA1C 5.5 05/28/2018   HGBA1C 5.8 (H) 02/06/2018   Lab Results  Component Value Date   INSULIN 4.5 09/18/2018   INSULIN 6.2 05/28/2018   INSULIN 8.7 02/06/2018   CBC    Component Value Date/Time   WBC 4.8 05/28/2018 0950   WBC 5.0 06/08/2016 1353   RBC 4.63  05/28/2018 0950   RBC 4.94 06/08/2016 1353   HGB 15.6 (H) 08/28/2018 1333   HGB 13.5 05/28/2018 0950   HGB 14.3 04/02/2014 0937   HCT 46.0 08/28/2018 1333   HCT 40.4 05/28/2018 0950   PLT 263 06/08/2016 1353   MCV 87 05/28/2018 0950   MCH 29.2 05/28/2018 0950   MCH 27.5 06/08/2016 1353   MCHC 33.4 05/28/2018 0950   MCHC 31.2 (L) 06/08/2016 1353   RDW 13.4 05/28/2018 0950   LYMPHSABS 1.1 05/28/2018 0950   MONOABS 450 06/08/2016 1353   EOSABS 0.1 05/28/2018 0950   BASOSABS 0.1 05/28/2018 0950   Iron/TIBC/Ferritin/ %Sat No results found for: IRON, TIBC, FERRITIN, IRONPCTSAT Lipid Panel     Component Value Date/Time   CHOL 185 09/18/2018 1225   TRIG 79 09/18/2018 1225   HDL 58 09/18/2018 1225   CHOLHDL 3.1 09/27/2017 1404   CHOLHDL 3.4 06/08/2016 1353   VLDL 19 06/08/2016 1353   LDLCALC 111 (H) 09/18/2018 1225   Hepatic Function Panel     Component Value Date/Time   PROT 6.3 09/18/2018 1225   ALBUMIN 4.4 09/18/2018 1225   AST 17 09/18/2018 1225   ALT 24 09/18/2018 1225   ALKPHOS 84 09/18/2018 1225   BILITOT 0.5 09/18/2018 1225      Component Value Date/Time   TSH 3.430 05/28/2018 0950   TSH 5.100 (H) 02/06/2018 0941   TSH 3.450 09/27/2017 1404   Results for SYD, MANGES "[name]" (MRN [id number]) as of 11/27/2018 11:22  Ref. Range 09/18/2018 12:25  Vitamin D, 25-Hydroxy Latest Ref Range: 30.0 - 100.0 ng/mL 39.2    I, Marcille Blanco, CMA, am acting as transcriptionist for Starlyn Skeans, MD I have reviewed the above documentation for accuracy and completeness, and I agree with the above. -Dennard Nip, MD

## 2018-12-17 ENCOUNTER — Ambulatory Visit: Payer: Federal, State, Local not specified - PPO | Admitting: Obstetrics & Gynecology

## 2018-12-18 ENCOUNTER — Encounter (INDEPENDENT_AMBULATORY_CARE_PROVIDER_SITE_OTHER): Payer: Self-pay | Admitting: Family Medicine

## 2018-12-18 ENCOUNTER — Ambulatory Visit (INDEPENDENT_AMBULATORY_CARE_PROVIDER_SITE_OTHER): Payer: Federal, State, Local not specified - PPO | Admitting: Family Medicine

## 2018-12-18 ENCOUNTER — Other Ambulatory Visit: Payer: Self-pay

## 2018-12-18 DIAGNOSIS — R7303 Prediabetes: Secondary | ICD-10-CM

## 2018-12-18 DIAGNOSIS — Z6836 Body mass index (BMI) 36.0-36.9, adult: Secondary | ICD-10-CM | POA: Diagnosis not present

## 2018-12-18 MED ORDER — METFORMIN HCL 500 MG PO TABS: ORAL_TABLET | ORAL | 0 refills | Status: DC

## 2018-12-18 NOTE — Progress Notes (Signed)
Office: [phone]  /  Fax: [phone] TeleHealth Visit:  Cheryl [name] has verbally consented to this TeleHealth visit today. The patient is located at work, the provider is located at the [employer] [facility] office. The participants in this visit include the listed provider and patient. The visit was conducted today via [email or web address].  HPI:   Chief Complaint: OBESITY Cheryl [name] is here to discuss her progress with her obesity treatment plan. She is keeping a food journal with 1200 calories and 70+ grams of protein and is following her eating plan approximately 25 % of the time. She states she is gardening and walking 30 minutes 3 times per week. Cheryl [name] has been struggling to stay on track and thinks that she has gained 4 pounds since our last visit. She has been ordering out and not journaling often, but is trying to be mindful of her food choices.  We were unable to weigh the patient today for this TeleHealth visit. She feels as if she has gained weight since her last visit. She has lost 54 lbs since starting treatment with [name].  Pre-Diabetes Cheryl [name] has a diagnosis of pre-diabetes based on her elevated Hgb A1c and was informed this puts her at greater risk of developing diabetes. She is often missing her 2nd dose of metformin and notes increased polyphagia, especially in the afternoon. Cheryl [name] is struggling to follow her eating plan. She denies nausea, vomiting, or hypoglycemia.   ASSESSMENT AND PLAN:  Prediabetes - Plan: metFORMIN (GLUCOPHAGE) 500 MG tablet  Class 2 severe obesity with serious comorbidity and body mass index (BMI) of 36.0 to 36.9 in adult, unspecified obesity type Cheryl [name])  PLAN:  Pre-Diabetes Cheryl [name] will continue to work on weight loss, exercise, and decreasing simple carbohydrates in her diet to help decrease the risk of diabetes. She was informed that eating too many simple carbohydrates or too many calories at one sitting increases the likelihood of GI side effects.  Cheryl [name] agreed to continue metformin 500 mg BID #60 with no refills and a prescription was written today. Cheryl [name] agreed to work on ways to remember her 2nd dose of metformin and will follow up with [name] as directed to monitor her progress in 3 weeks.   Obesity Cheryl [name] is currently in the action stage of change. As such, her goal is to continue with weight loss efforts. She has agreed to keep a food journal with 1200 calories and 70+ grams of protein.  Cheryl [name] has been instructed to continue walking and gardening. We discussed the following Behavioral Modification Strategies today: decrease eating out, ways to avoid boredom eating, and ways to avoid night time snacking.  Cheryl [name] has agreed to follow up with our clinic in 3 weeks. She was informed of the importance of frequent follow up visits to maximize her success with intensive lifestyle modifications for her multiple health conditions.  ALLERGIES: Allergies  Allergen Reactions  . Ace Inhibitors Cough  . Lisinopril Cough  . Neosporin [Neomycin-Bacitracin Zn-Polymyx] Rash  . Z-Pak [Azithromycin] Other (See Comments)    thrush    MEDICATIONS: Current Outpatient Medications on File Prior to Visit  Medication Sig Dispense Refill  . aspirin 81 MG chewable tablet Chew 81 mg by mouth daily.    . Cholecalciferol (VITAMIN D3) 1000 UNITS CAPS Take 1 capsule by mouth daily.     Marland Kitchen docusate sodium (COLACE) 100 MG capsule Take 100 mg by mouth daily.    . Loratadine (CLARITIN PO) Take 1 tablet by mouth daily.     Marland Kitchen  losartan (COZAAR) 100 MG tablet Take 1 tablet (100 mg total) by mouth daily. 30 tablet 0  . meloxicam (MOBIC) 15 MG tablet Take 1 tablet by mouth at bedtime.  3  . nitroGLYCERIN (NITROSTAT) 0.4 MG SL tablet Place 1 tablet (0.4 mg total) under the tongue every 5 (five) minutes as needed for chest pain. 12 tablet 0  . UNABLE TO FIND C PAP for sleep apnea     . venlafaxine (EFFEXOR) 75 MG tablet TAKE 1 TABLET(75 MG) BY MOUTH DAILY 90 tablet 0    Current Facility-Administered Medications on File Prior to Visit  Medication Dose Route Frequency Provider Last Rate Last Dose  . 0.9 %  sodium chloride infusion  500 mL Intravenous Once Armbruster, Carlota Raspberry, MD        PAST MEDICAL HISTORY: Past Medical History:  Diagnosis Date  . Allergy   . Arthritis   . Chest pain   . Depression   . Fatigue   . GERD (gastroesophageal reflux disease)   . Hyperlipidemia   . Hypertension   . Joint pain   . Obesity   . Osteopenia   . Palpitation   . Sleep apnea    uses CPAP   . SOB (shortness of breath)   . Urinary incontinence     PAST SURGICAL HISTORY: Past Surgical History:  Procedure Laterality Date  . ABDOMINAL HYSTERECTOMY  08/10/2000   TAH/RSO  . BREAST SURGERY     Biopsy-Benign  . COLOSTOMY  2000   Dr.Patterson  . KNEE ARTHROSCOPY     left knee  . LAPAROSCOPIC CHOLECYSTECTOMY  2010  . LAPAROTOMY  1990   Fibroid removed-ovarian cyst   . RHINOPLASTY      SOCIAL HISTORY: Social History   Tobacco Use  . Smoking status: Former Smoker    Last attempt to quit: 08/15/2007    Years since quitting: 11.3  . Smokeless tobacco: Never Used  Substance Use Topics  . Alcohol use: Yes    Alcohol/week: 3.0 standard drinks    Types: 3 Standard drinks or equivalent per week    Comment: 3 drinks per week per pt   . Drug use: No    FAMILY HISTORY: Family History  Problem Relation Age of Onset  . Breast cancer Mother        Age 60  . Hypertension Mother   . Depression Mother   . Hypertension Father   . Arthritis Father   . Heart disease Father   . Cancer Father        Spleen  . Sleep apnea Father   . Heart disease Maternal Grandfather   . Colon cancer Paternal Grandfather        Colon cancer  . Esophageal cancer Neg Hx   . Stomach cancer Neg Hx     ROS: Review of Systems  Gastrointestinal: Negative for nausea and vomiting.  Endo/Heme/Allergies:       Positive for polyphagia.    PHYSICAL EXAM: Pt in no acute  distress  RECENT LABS AND TESTS: BMET    Component Value Date/Time   NA 144 09/18/2018 1225   K 4.8 09/18/2018 1225   CL 105 09/18/2018 1225   CO2 23 09/18/2018 1225   GLUCOSE 84 09/18/2018 1225   GLUCOSE 93 08/28/2018 1333   BUN 17 09/18/2018 1225   CREATININE 0.73 09/18/2018 1225   CREATININE 0.81 06/08/2016 1353   CALCIUM 9.1 09/18/2018 1225   GFRNONAA 89 09/18/2018 1225   GFRAA 102 09/18/2018 1225  Lab Results  Component Value Date   HGBA1C 5.4 09/18/2018   HGBA1C 5.5 05/28/2018   HGBA1C 5.8 (H) 02/06/2018   Lab Results  Component Value Date   INSULIN 4.5 09/18/2018   INSULIN 6.2 05/28/2018   INSULIN 8.7 02/06/2018   CBC    Component Value Date/Time   WBC 4.8 05/28/2018 0950   WBC 5.0 06/08/2016 1353   RBC 4.63 05/28/2018 0950   RBC 4.94 06/08/2016 1353   HGB 15.6 (H) 08/28/2018 1333   HGB 13.5 05/28/2018 0950   HGB 14.3 04/02/2014 0937   HCT 46.0 08/28/2018 1333   HCT 40.4 05/28/2018 0950   PLT 263 06/08/2016 1353   MCV 87 05/28/2018 0950   MCH 29.2 05/28/2018 0950   MCH 27.5 06/08/2016 1353   MCHC 33.4 05/28/2018 0950   MCHC 31.2 (L) 06/08/2016 1353   RDW 13.4 05/28/2018 0950   LYMPHSABS 1.1 05/28/2018 0950   MONOABS 450 06/08/2016 1353   EOSABS 0.1 05/28/2018 0950   BASOSABS 0.1 05/28/2018 0950   Iron/TIBC/Ferritin/ %Sat No results found for: IRON, TIBC, FERRITIN, IRONPCTSAT Lipid Panel     Component Value Date/Time   CHOL 185 09/18/2018 1225   TRIG 79 09/18/2018 1225   HDL 58 09/18/2018 1225   CHOLHDL 3.1 09/27/2017 1404   CHOLHDL 3.4 06/08/2016 1353   VLDL 19 06/08/2016 1353   LDLCALC 111 (H) 09/18/2018 1225   Hepatic Function Panel     Component Value Date/Time   PROT 6.3 09/18/2018 1225   ALBUMIN 4.4 09/18/2018 1225   AST 17 09/18/2018 1225   ALT 24 09/18/2018 1225   ALKPHOS 84 09/18/2018 1225   BILITOT 0.5 09/18/2018 1225      Component Value Date/Time   TSH 3.430 05/28/2018 0950   TSH 5.100 (H) 02/06/2018 0941   TSH 3.450  09/27/2017 1404    Results for DEBY, ADGER "[name]" (MRN [id number]) as of 12/18/2018 10:57  Ref. Range 09/18/2018 12:25  Vitamin D, 25-Hydroxy Latest Ref Range: 30.0 - 100.0 ng/mL 39.2    I, Marcille Blanco, CMA, am acting as transcriptionist for Starlyn Skeans, MD I have reviewed the above documentation for accuracy and completeness, and I agree with the above. -Dennard Nip, MD

## 2019-01-13 ENCOUNTER — Other Ambulatory Visit: Payer: Self-pay

## 2019-01-13 ENCOUNTER — Encounter (INDEPENDENT_AMBULATORY_CARE_PROVIDER_SITE_OTHER): Payer: Self-pay | Admitting: Family Medicine

## 2019-01-13 ENCOUNTER — Ambulatory Visit (INDEPENDENT_AMBULATORY_CARE_PROVIDER_SITE_OTHER): Payer: Federal, State, Local not specified - PPO | Admitting: Family Medicine

## 2019-01-13 DIAGNOSIS — Z6836 Body mass index (BMI) 36.0-36.9, adult: Secondary | ICD-10-CM

## 2019-01-13 DIAGNOSIS — I10 Essential (primary) hypertension: Secondary | ICD-10-CM

## 2019-01-13 MED ORDER — LOSARTAN POTASSIUM 100 MG PO TABS: 100.0000 mg | ORAL_TABLET | Freq: Every day | ORAL | 0 refills | Status: DC

## 2019-01-13 NOTE — Progress Notes (Signed)
Office: [phone]  /  Fax: [phone] TeleHealth Visit:  Cheryl [name] has verbally consented to this TeleHealth visit today. The patient is located at work, the provider is located at the [employer] [facility] office. The participants in this visit include the listed provider and patient. The visit was conducted today via [email or web address].  HPI:   Chief Complaint: OBESITY Cheryl [name] is here to discuss her progress with her obesity treatment plan. She is keeping a food journal with 1200 calories and 70+ grams of protein and is following her eating plan approximately 10 % of the time. She states she is gardening and walking 45 minutes 3 times per week. Cheryl [name] has been struggling in the last 2 weeks to keep a strict journal. She has instead been trying to portion control and make smarter choices, but looks like she has gained a bit of weight with this strategy. Cheryl [name] was also on vacation for part of this time. She is ready to get back on track now.  We were unable to weigh the patient today for this TeleHealth visit. She feels as if she has gained weight since her last visit. She has lost 54 lbs since starting treatment with [name].  Hypertension Cheryl [name] is a 63 y.o. female with hypertension. Cheryl [name], but her blood pressure has been well controlled in the recent past. She is working on weight loss to help control her blood pressure with the goal of decreasing her risk of heart attack and stroke.  ASSESSMENT AND PLAN:  Essential hypertension - Plan: losartan (COZAAR) 100 MG tablet  Class 2 severe obesity with serious comorbidity and body mass index (BMI) of 36.0 to 36.9 in adult, unspecified obesity type ([place])  PLAN:  Hypertension We discussed sodium restriction, working on healthy weight loss, and a regular exercise program as the means to achieve improved blood pressure control. We will continue to monitor her blood pressure as well as her  progress with the above lifestyle modifications. She will continue her losartan 100 mg qd #30 with no refills and will watch for signs of hypotension as she continues her lifestyle modifications. Cheryl [name] agreed with this plan and agreed to follow up as directed in 2 weeks.  Obesity Cheryl [name] is currently in the action stage of change. As such, her goal is to continue with weight loss efforts. She has agreed to keep a food journal with 1200 calories and 70+ grams of protein.  Cheryl [name] has been instructed to work up to a goal of 150 minutes of combined cardio and strengthening exercise per week for weight loss and overall health benefits. We discussed the following Behavioral Modification Strategies today: decrease liquid calories.  Cheryl [name] has agreed to follow up with our clinic in 2 weeks. She was informed of the importance of frequent follow up visits to maximize her success with intensive lifestyle modifications for her multiple health conditions.  ALLERGIES: Allergies  Allergen Reactions  . Ace Inhibitors Cough  . Lisinopril Cough  . Neosporin [Neomycin-Bacitracin Zn-Polymyx] Rash  . Z-Pak [Azithromycin] Other (See Comments)    thrush    MEDICATIONS: Current Outpatient Medications on File Prior to Visit  Medication Sig Dispense Refill  . aspirin 81 MG chewable tablet Chew 81 mg by mouth daily.    . Cholecalciferol (VITAMIN D3) 1000 UNITS CAPS Take 1 capsule by mouth daily.     Marland Kitchen docusate sodium (COLACE) 100 MG capsule Take 100 mg by mouth daily.    Marland Kitchen  Loratadine (CLARITIN PO) Take 1 tablet by mouth daily.     . meloxicam (MOBIC) 15 MG tablet Take 1 tablet by mouth at bedtime.  3  . metFORMIN (GLUCOPHAGE) 500 MG tablet TAKE 1 TABLET(500 MG) BY MOUTH TWICE DAILY WITH A MEAL 60 tablet 0  . nitroGLYCERIN (NITROSTAT) 0.4 MG SL tablet Place 1 tablet (0.4 mg total) under the tongue every 5 (five) minutes as needed for chest pain. 12 tablet 0  . UNABLE TO FIND C PAP for sleep apnea     .  venlafaxine (EFFEXOR) 75 MG tablet TAKE 1 TABLET(75 MG) BY MOUTH DAILY 90 tablet 0   Current Facility-Administered Medications on File Prior to Visit  Medication Dose Route Frequency Provider Last Rate Last Dose  . 0.9 %  sodium chloride infusion  500 mL Intravenous Once Armbruster, Carlota Raspberry, MD        PAST MEDICAL HISTORY: Past Medical History:  Diagnosis Date  . Allergy   . Arthritis   . Chest pain   . Depression   . Fatigue   . GERD (gastroesophageal reflux disease)   . Hyperlipidemia   . Hypertension   . Joint pain   . Obesity   . Osteopenia   . Palpitation   . Sleep apnea    uses CPAP   . SOB (shortness of breath)   . Urinary incontinence     PAST SURGICAL HISTORY: Past Surgical History:  Procedure Laterality Date  . ABDOMINAL HYSTERECTOMY  08/10/2000   TAH/RSO  . BREAST SURGERY     Biopsy-Benign  . COLOSTOMY  2000   Dr.Patterson  . KNEE ARTHROSCOPY     left knee  . LAPAROSCOPIC CHOLECYSTECTOMY  2010  . LAPAROTOMY  1990   Fibroid removed-ovarian cyst   . RHINOPLASTY      SOCIAL HISTORY: Social History   Tobacco Use  . Smoking status: Former Smoker    Last attempt to quit: 08/15/2007    Years since quitting: 11.4  . Smokeless tobacco: Never Used  Substance Use Topics  . Alcohol use: Yes    Alcohol/week: 3.0 standard drinks    Types: 3 Standard drinks or equivalent per week    Comment: 3 drinks per week per pt   . Drug use: No    FAMILY HISTORY: Family History  Problem Relation Age of Onset  . Breast cancer Mother        Age 49  . Hypertension Mother   . Depression Mother   . Hypertension Father   . Arthritis Father   . Heart disease Father   . Cancer Father        Spleen  . Sleep apnea Father   . Heart disease Maternal Grandfather   . Colon cancer Paternal Grandfather        Colon cancer  . Esophageal cancer Neg Hx   . Stomach cancer Neg Hx     ROS: ROS  PHYSICAL EXAM: Pt in no acute distress  RECENT LABS AND TESTS: BMET     Component Value Date/Time   NA 144 09/18/2018 1225   K 4.8 09/18/2018 1225   CL 105 09/18/2018 1225   CO2 23 09/18/2018 1225   GLUCOSE 84 09/18/2018 1225   GLUCOSE 93 08/28/2018 1333   BUN 17 09/18/2018 1225   CREATININE 0.73 09/18/2018 1225   CREATININE 0.81 06/08/2016 1353   CALCIUM 9.1 09/18/2018 1225   GFRNONAA 89 09/18/2018 1225   GFRAA 102 09/18/2018 1225   Lab Results  Component  Value Date   HGBA1C 5.4 09/18/2018   HGBA1C 5.5 05/28/2018   HGBA1C 5.8 (H) 02/06/2018   Lab Results  Component Value Date   INSULIN 4.5 09/18/2018   INSULIN 6.2 05/28/2018   INSULIN 8.7 02/06/2018   CBC    Component Value Date/Time   WBC 4.8 05/28/2018 0950   WBC 5.0 06/08/2016 1353   RBC 4.63 05/28/2018 0950   RBC 4.94 06/08/2016 1353   HGB 15.6 (H) 08/28/2018 1333   HGB 13.5 05/28/2018 0950   HGB 14.3 04/02/2014 0937   HCT 46.0 08/28/2018 1333   HCT 40.4 05/28/2018 0950   PLT 263 06/08/2016 1353   MCV 87 05/28/2018 0950   MCH 29.2 05/28/2018 0950   MCH 27.5 06/08/2016 1353   MCHC 33.4 05/28/2018 0950   MCHC 31.2 (L) 06/08/2016 1353   RDW 13.4 05/28/2018 0950   LYMPHSABS 1.1 05/28/2018 0950   MONOABS 450 06/08/2016 1353   EOSABS 0.1 05/28/2018 0950   BASOSABS 0.1 05/28/2018 0950   Iron/TIBC/Ferritin/ %Sat No results found for: IRON, TIBC, FERRITIN, IRONPCTSAT Lipid Panel     Component Value Date/Time   CHOL 185 09/18/2018 1225   TRIG 79 09/18/2018 1225   HDL 58 09/18/2018 1225   CHOLHDL 3.1 09/27/2017 1404   CHOLHDL 3.4 06/08/2016 1353   VLDL 19 06/08/2016 1353   LDLCALC 111 (H) 09/18/2018 1225   Hepatic Function Panel     Component Value Date/Time   PROT 6.3 09/18/2018 1225   ALBUMIN 4.4 09/18/2018 1225   AST 17 09/18/2018 1225   ALT 24 09/18/2018 1225   ALKPHOS 84 09/18/2018 1225   BILITOT 0.5 09/18/2018 1225      Component Value Date/Time   TSH 3.430 05/28/2018 0950   TSH 5.100 (H) 02/06/2018 0941   TSH 3.450 09/27/2017 1404    Results for GETSEMANI, LINDON "[name]" (MRN [id number]) as of 01/13/2019 13:08  Ref. Range 09/18/2018 12:25  Vitamin D, 25-Hydroxy Latest Ref Range: 30.0 - 100.0 ng/mL 39.2    I, Marcille Blanco, CMA, am acting as transcriptionist for Starlyn Skeans, MD I have reviewed the above documentation for accuracy and completeness, and I agree with the above. -Dennard Nip, MD

## 2019-01-27 ENCOUNTER — Ambulatory Visit (INDEPENDENT_AMBULATORY_CARE_PROVIDER_SITE_OTHER): Payer: Federal, State, Local not specified - PPO | Admitting: Family Medicine

## 2019-01-27 ENCOUNTER — Encounter (INDEPENDENT_AMBULATORY_CARE_PROVIDER_SITE_OTHER): Payer: Self-pay | Admitting: Family Medicine

## 2019-01-27 ENCOUNTER — Other Ambulatory Visit: Payer: Self-pay

## 2019-01-27 VITALS — BP 137/82 | HR 76 | Temp 98.1°F | Ht 66.0 in | Wt 229.0 lb

## 2019-01-27 DIAGNOSIS — R7303 Prediabetes: Secondary | ICD-10-CM

## 2019-01-27 DIAGNOSIS — Z6837 Body mass index (BMI) 37.0-37.9, adult: Secondary | ICD-10-CM | POA: Diagnosis not present

## 2019-01-27 DIAGNOSIS — I10 Essential (primary) hypertension: Secondary | ICD-10-CM

## 2019-01-27 MED ORDER — METFORMIN HCL 500 MG PO TABS: ORAL_TABLET | ORAL | 0 refills | Status: DC

## 2019-01-28 NOTE — Progress Notes (Signed)
Office: [phone]  /  Fax: [phone]   HPI:   Chief Complaint: OBESITY Cheryl [name] is here to discuss her progress with her obesity treatment plan. She is on the keep a food journal with 1200 calories and 70+ grams of protein daily and is following her eating plan approximately 25 % of the time. She states she is walking and gardening for 45 minutes 2 times per week. Cheryl [name]'s last visit in the office was 3 months ago and she has done well maintaining her weight. She noticed she snacked more when she was working from home. She feels she is doing better and ready to get back to journaling.  Her weight is 229 lb (103.9 kg) today and has not lost weight since her last visit. She has lost 52 lbs since starting treatment with [name].  Pre-Diabetes Cheryl [name] has a diagnosis of pre-diabetes based on her elevated Hgb A1c and was informed this puts her at greater risk of developing diabetes. She is stable on metformin and diet, and denies nausea, vomiting, or hypoglycemia. Last A1c was well controlled.   Hypertension Cheryl [name] is a 63 y.o. female with hypertension. Cheryl [name]'s blood pressure is stable on losartan. She denies chest pain or headaches. She is doing with diet and trying to decrease Na+. She is working on weight loss to help control her blood pressure with the goal of decreasing her risk of heart attack and stroke.   ASSESSMENT AND PLAN:  Prediabetes - Plan: metFORMIN (GLUCOPHAGE) 500 MG tablet  Essential hypertension  Class 2 severe obesity with serious comorbidity and body mass index (BMI) of 37.0 to 37.9 in adult, unspecified obesity type [facility])  PLAN:  Pre-Diabetes Cheryl [name] will continue to work on weight loss, exercise, and decreasing simple carbohydrates in her diet to help decrease the risk of diabetes. We dicussed metformin including benefits and risks. She was informed that eating too many simple carbohydrates or too many calories at one sitting increases the likelihood of GI  side effects. Cheryl [name] agrees to continue taking metformin 500 mg BID #60 and we will refill for 1 month. Cheryl [name] agrees to follow up with our clinic in 2 weeks as directed to monitor her progress.  Hypertension We discussed sodium restriction, working on healthy weight loss, and a regular exercise program as the means to achieve improved blood pressure control. Cheryl [name] agreed with this plan and agreed to follow up as directed. We will continue to monitor her blood pressure as well as her progress with the above lifestyle modifications. Cheryl [name] agrees to continue her medications, diet, and exercise, and will watch for signs of hypotension as she continues her lifestyle modifications.  Obesity Cheryl [name] is currently in the action stage of change. As such, her goal is to continue with weight loss efforts She has agreed to keep a food journal with 1200 calories and 70+ grams of protein daily Cheryl [name] has been instructed to work up to a goal of 150 minutes of combined cardio and strengthening exercise per week for weight loss and overall health benefits. We discussed the following Behavioral Modification Strategies today: increasing lean protein intake, decreasing simple carbohydrates  and work on meal planning and easy cooking plans, and keep a strict food journal   Cheryl [name] has agreed to follow up with our clinic in 2 weeks. She was informed of the importance of frequent follow up visits to maximize her success with intensive lifestyle modifications for her multiple health conditions.  ALLERGIES: Allergies  Allergen Reactions  . Ace Inhibitors  Cough  . Lisinopril Cough  . Neosporin [Neomycin-Bacitracin Zn-Polymyx] Rash  . Z-Pak [Azithromycin] Other (See Comments)    thrush    MEDICATIONS: Current Outpatient Medications on File Prior to Visit  Medication Sig Dispense Refill  . aspirin 81 MG chewable tablet Chew 81 mg by mouth daily.    . Cholecalciferol (VITAMIN D3) 1000 UNITS CAPS Take 1 capsule by  mouth daily.     Marland Kitchen docusate sodium (COLACE) 100 MG capsule Take 100 mg by mouth daily.    . Loratadine (CLARITIN PO) Take 1 tablet by mouth daily.     Marland Kitchen losartan (COZAAR) 100 MG tablet Take 1 tablet (100 mg total) by mouth daily. 30 tablet 0  . meloxicam (MOBIC) 15 MG tablet Take 1 tablet by mouth at bedtime.  3  . nitroGLYCERIN (NITROSTAT) 0.4 MG SL tablet Place 1 tablet (0.4 mg total) under the tongue every 5 (five) minutes as needed for chest pain. 12 tablet 0  . UNABLE TO FIND C PAP for sleep apnea     . venlafaxine (EFFEXOR) 75 MG tablet TAKE 1 TABLET(75 MG) BY MOUTH DAILY 90 tablet 0   Current Facility-Administered Medications on File Prior to Visit  Medication Dose Route Frequency Provider Last Rate Last Dose  . 0.9 %  sodium chloride infusion  500 mL Intravenous Once Armbruster, Carlota Raspberry, MD        PAST MEDICAL HISTORY: Past Medical History:  Diagnosis Date  . Allergy   . Arthritis   . Chest pain   . Depression   . Fatigue   . GERD (gastroesophageal reflux disease)   . Hyperlipidemia   . Hypertension   . Joint pain   . Obesity   . Osteopenia   . Palpitation   . Sleep apnea    uses CPAP   . SOB (shortness of breath)   . Urinary incontinence     PAST SURGICAL HISTORY: Past Surgical History:  Procedure Laterality Date  . ABDOMINAL HYSTERECTOMY  08/10/2000   TAH/RSO  . BREAST SURGERY     Biopsy-Benign  . COLOSTOMY  2000   Dr.Patterson  . KNEE ARTHROSCOPY     left knee  . LAPAROSCOPIC CHOLECYSTECTOMY  2010  . LAPAROTOMY  1990   Fibroid removed-ovarian cyst   . RHINOPLASTY      SOCIAL HISTORY: Social History   Tobacco Use  . Smoking status: Former Smoker    Quit date: 08/15/2007    Years since quitting: 11.4  . Smokeless tobacco: Never Used  Substance Use Topics  . Alcohol use: Yes    Alcohol/week: 3.0 standard drinks    Types: 3 Standard drinks or equivalent per week    Comment: 3 drinks per week per pt   . Drug use: No    FAMILY HISTORY: Family  History  Problem Relation Age of Onset  . Breast cancer Mother        Age 52  . Hypertension Mother   . Depression Mother   . Hypertension Father   . Arthritis Father   . Heart disease Father   . Cancer Father        Spleen  . Sleep apnea Father   . Heart disease Maternal Grandfather   . Colon cancer Paternal Grandfather        Colon cancer  . Esophageal cancer Neg Hx   . Stomach cancer Neg Hx     ROS: Review of Systems  Constitutional: Negative for weight loss.  Cardiovascular: Negative for chest  pain.  Gastrointestinal: Negative for nausea and vomiting.  Neurological: Negative for headaches.  Endo/Heme/Allergies:       Negative hypoglycemia    PHYSICAL EXAM: Blood pressure 137/82, pulse 76, temperature 98.1 F (36.7 C), temperature source Oral, height 5\' 6"  (1.676 m), weight 229 lb (103.9 kg), SpO2 99 %. Body mass index is 36.96 kg/m. Physical Exam Vitals signs reviewed.  Constitutional:      Appearance: Normal appearance. She is obese.  Cardiovascular:     Rate and Rhythm: Normal rate.     Pulses: Normal pulses.  Pulmonary:     Effort: Pulmonary effort is normal.     Breath sounds: Normal breath sounds.  Musculoskeletal: Normal range of motion.  Skin:    General: Skin is warm and dry.  Neurological:     Mental Status: She is alert and oriented to person, place, and time.  Psychiatric:        Mood and Affect: Mood normal.        Behavior: Behavior normal.     RECENT LABS AND TESTS: BMET    Component Value Date/Time   NA 144 09/18/2018 1225   K 4.8 09/18/2018 1225   CL 105 09/18/2018 1225   CO2 23 09/18/2018 1225   GLUCOSE 84 09/18/2018 1225   GLUCOSE 93 08/28/2018 1333   BUN 17 09/18/2018 1225   CREATININE 0.73 09/18/2018 1225   CREATININE 0.81 06/08/2016 1353   CALCIUM 9.1 09/18/2018 1225   GFRNONAA 89 09/18/2018 1225   GFRAA 102 09/18/2018 1225   Lab Results  Component Value Date   HGBA1C 5.4 09/18/2018   HGBA1C 5.5 05/28/2018   HGBA1C  5.8 (H) 02/06/2018   Lab Results  Component Value Date   INSULIN 4.5 09/18/2018   INSULIN 6.2 05/28/2018   INSULIN 8.7 02/06/2018   CBC    Component Value Date/Time   WBC 4.8 05/28/2018 0950   WBC 5.0 06/08/2016 1353   RBC 4.63 05/28/2018 0950   RBC 4.94 06/08/2016 1353   HGB 15.6 (H) 08/28/2018 1333   HGB 13.5 05/28/2018 0950   HGB 14.3 04/02/2014 0937   HCT 46.0 08/28/2018 1333   HCT 40.4 05/28/2018 0950   PLT 263 06/08/2016 1353   MCV 87 05/28/2018 0950   MCH 29.2 05/28/2018 0950   MCH 27.5 06/08/2016 1353   MCHC 33.4 05/28/2018 0950   MCHC 31.2 (L) 06/08/2016 1353   RDW 13.4 05/28/2018 0950   LYMPHSABS 1.1 05/28/2018 0950   MONOABS 450 06/08/2016 1353   EOSABS 0.1 05/28/2018 0950   BASOSABS 0.1 05/28/2018 0950   Iron/TIBC/Ferritin/ %Sat No results found for: IRON, TIBC, FERRITIN, IRONPCTSAT Lipid Panel     Component Value Date/Time   CHOL 185 09/18/2018 1225   TRIG 79 09/18/2018 1225   HDL 58 09/18/2018 1225   CHOLHDL 3.1 09/27/2017 1404   CHOLHDL 3.4 06/08/2016 1353   VLDL 19 06/08/2016 1353   LDLCALC 111 (H) 09/18/2018 1225   Hepatic Function Panel     Component Value Date/Time   PROT 6.3 09/18/2018 1225   ALBUMIN 4.4 09/18/2018 1225   AST 17 09/18/2018 1225   ALT 24 09/18/2018 1225   ALKPHOS 84 09/18/2018 1225   BILITOT 0.5 09/18/2018 1225      Component Value Date/Time   TSH 3.430 05/28/2018 0950   TSH 5.100 (H) 02/06/2018 0941   TSH 3.450 09/27/2017 1404      OBESITY BEHAVIORAL INTERVENTION VISIT  Today's visit was # 20   Starting weight: 281  lbs Starting date: 02/06/18 Today's weight : 229 lbs Today's date: 01/27/2019 Total lbs lost to date: 55    ASK: We discussed the diagnosis of obesity with Cheryl [name] today and Cheryl [name] agreed to give [name] permission to discuss obesity behavioral modification therapy today.  ASSESS: [place] has the diagnosis of obesity and her BMI today is 39.98 Cheryl [name] is in the action stage of change    ADVISE: Cheryl [name] was educated on the multiple health risks of obesity as well as the benefit of weight loss to improve her health. She was advised of the need for long term treatment and the importance of lifestyle modifications to improve her current health and to decrease her risk of future health problems.  AGREE: Multiple dietary modification options and treatment options were discussed and  Cheryl [name] agreed to follow the recommendations documented in the above note.  ARRANGE: Cheryl [name] was educated on the importance of frequent visits to treat obesity as outlined per CMS and USPSTF guidelines and agreed to schedule her next follow up appointment today.  I, Trixie Dredge, am acting as transcriptionist for Dennard Nip, MD  I have reviewed the above documentation for accuracy and completeness, and I agree with the above. -Dennard Nip, MD

## 2019-02-10 ENCOUNTER — Encounter (INDEPENDENT_AMBULATORY_CARE_PROVIDER_SITE_OTHER): Payer: Self-pay | Admitting: Family Medicine

## 2019-02-10 ENCOUNTER — Ambulatory Visit (INDEPENDENT_AMBULATORY_CARE_PROVIDER_SITE_OTHER): Payer: Federal, State, Local not specified - PPO | Admitting: Family Medicine

## 2019-02-10 ENCOUNTER — Other Ambulatory Visit: Payer: Self-pay

## 2019-02-10 VITALS — BP 136/86 | HR 68 | Temp 97.8°F | Ht 66.0 in | Wt 231.0 lb

## 2019-02-10 DIAGNOSIS — I10 Essential (primary) hypertension: Secondary | ICD-10-CM

## 2019-02-10 DIAGNOSIS — Z6837 Body mass index (BMI) 37.0-37.9, adult: Secondary | ICD-10-CM

## 2019-02-10 DIAGNOSIS — Z9189 Other specified personal risk factors, not elsewhere classified: Secondary | ICD-10-CM

## 2019-02-10 DIAGNOSIS — R600 Localized edema: Secondary | ICD-10-CM | POA: Diagnosis not present

## 2019-02-10 MED ORDER — LOSARTAN POTASSIUM 100 MG PO TABS: 100.0000 mg | ORAL_TABLET | Freq: Every day | ORAL | 0 refills | Status: DC

## 2019-02-11 NOTE — Progress Notes (Signed)
Office: [phone]  /  Fax: [phone]   HPI:   Chief Complaint: OBESITY Cheryl Cheryl [name] is here to discuss her progress with her obesity treatment plan. She is on the keep a food journal with 1200 calories and 70+ grams of protein daily and is following her eating plan approximately 25 % of the time. She states she is walking and gardening for 45 minutes 3 times per week. Cheryl Cheryl [name] is retaining some fluid today, and she notes increased Na+ in her diet last night and mild edema in her feet. She denies shortness of breath at rest or orthopnea. She is getting bored and not journaling often. She would like to look at other meal plan options.  Her weight is 231 lb (104.8 kg) today and has gained 2 lbs since her last visit. She has lost 50 lbs since starting treatment with [name].  Hypertension Cheryl Cheryl [name] is a 63 y.o. female with hypertension. Cheryl Cheryl [name]'s blood pressure is stable on medications. She denies chest pain, headaches, or dizziness. She is working on weight loss to help control her blood pressure with the goal of decreasing her risk of heart attack and stroke.   At risk for cardiovascular disease Cheryl Cheryl [name] is at a higher than average risk for cardiovascular disease Cheryl [name] to obesity and hypertension. She currently denies any chest pain.  Bilateral Lower Extremity Edema Cheryl Cheryl [name] notes mild bilateral edema, but she denies signs of congestive heart failure. She has increased Na+ and simple carbohydrates which are likely contributing.  ASSESSMENT AND PLAN:  Essential hypertension - Plan: losartan (COZAAR) 100 MG tablet  Lower extremity edema  At risk for heart disease  Class 2 severe obesity with serious comorbidity and body mass index (BMI) of 37.0 to 37.9 in adult, unspecified obesity type (Cheryl Cheryl [name])  PLAN:  Hypertension We discussed sodium restriction, working on healthy weight loss, and a regular exercise program as the means to achieve improved blood pressure control. Cheryl Cheryl [name] agreed with this plan  and agreed to follow up as directed. We will continue to monitor her blood pressure as well as her progress with the above lifestyle modifications. Cheryl Cheryl [name] agrees to continue taking losartan 100 mg qd #30 and we will refill for 1 month. She will continue diet and exercise, and will watch for signs of hypotension as she continues her lifestyle modifications. Cheryl Cheryl [name] agrees to follow up with our clinic in 2 to 3 weeks.  Cardiovascular risk counseling Cheryl Cheryl [name] was given extended (15 minutes) coronary artery disease prevention counseling today. She is 63 y.o. female and has risk factors for heart disease including obesity and hypertension. We discussed intensive lifestyle modifications today with an emphasis on specific weight loss instructions and strategies. Pt was also informed of the importance of increasing exercise and decreasing saturated fats to help prevent heart disease.  Bilateral Lower Extremity Edema Cheryl Cheryl [name] is to decrease Na+ and increase H20, and she agrees to follow up with our clinic in 2 to 3 weeks.  Obesity Cheryl Cheryl [name] is currently in the action stage of change. As such, her goal is to continue with weight loss efforts She has agreed to change to follow a lower carbohydrate, vegetable and lean protein rich diet plan Cheryl Cheryl [name] has been instructed to work up to a goal of 150 minutes of combined cardio and strengthening exercise per week for weight loss and overall health benefits. We discussed the following Behavioral Modification Strategies today: increasing lean protein intake, in crease H20 intake, decreasing sodium intake, and work on meal planning and easy cooking plans  Cheryl Cheryl [name] has agreed to follow up with our clinic in 2 to 3 weeks. She was informed of the importance of frequent follow up visits to maximize her success with intensive lifestyle modifications for her multiple health conditions.  ALLERGIES: Allergies  Allergen Reactions  . Ace Inhibitors Cough  . Lisinopril Cough  .  Neosporin [Neomycin-Bacitracin Zn-Polymyx] Rash  . Z-Pak [Azithromycin] Other (See Comments)    thrush    MEDICATIONS: Current Outpatient Medications on File Prior to Visit  Medication Sig Dispense Refill  . aspirin 81 MG chewable tablet Chew 81 mg by mouth daily.    . Cholecalciferol (VITAMIN D3) 1000 UNITS CAPS Take 1 capsule by mouth daily.     Marland Kitchen docusate sodium (COLACE) 100 MG capsule Take 100 mg by mouth daily.    . Loratadine (CLARITIN PO) Take 1 tablet by mouth daily.     . meloxicam (MOBIC) 15 MG tablet Take 1 tablet by mouth at bedtime.  3  . metFORMIN (GLUCOPHAGE) 500 MG tablet TAKE 1 TABLET(500 MG) BY MOUTH TWICE DAILY WITH A MEAL 60 tablet 0  . nitroGLYCERIN (NITROSTAT) 0.4 MG SL tablet Place 1 tablet (0.4 mg total) under the tongue every 5 (five) minutes as needed for chest pain. 12 tablet 0  . UNABLE TO FIND C PAP for sleep apnea     . venlafaxine (EFFEXOR) 75 MG tablet TAKE 1 TABLET(75 MG) BY MOUTH DAILY 90 tablet 0   Current Facility-Administered Medications on File Prior to Visit  Medication Dose Route Frequency Provider Last Rate Last Dose  . 0.9 %  sodium chloride infusion  500 mL Intravenous Once Armbruster, Carlota Raspberry, MD        PAST MEDICAL HISTORY: Past Medical History:  Diagnosis Date  . Allergy   . Arthritis   . Chest pain   . Depression   . Fatigue   . GERD (gastroesophageal reflux disease)   . Hyperlipidemia   . Hypertension   . Joint pain   . Obesity   . Osteopenia   . Palpitation   . Sleep apnea    uses CPAP   . SOB (shortness of breath)   . Urinary incontinence     PAST SURGICAL HISTORY: Past Surgical History:  Procedure Laterality Date  . ABDOMINAL HYSTERECTOMY  08/10/2000   TAH/RSO  . BREAST SURGERY     Biopsy-Benign  . COLOSTOMY  2000   Dr.Patterson  . KNEE ARTHROSCOPY     left knee  . LAPAROSCOPIC CHOLECYSTECTOMY  2010  . LAPAROTOMY  1990   Fibroid removed-ovarian cyst   . RHINOPLASTY      SOCIAL HISTORY: Social History    Tobacco Use  . Smoking status: Former Smoker    Quit date: 08/15/2007    Years since quitting: 11.5  . Smokeless tobacco: Never Used  Substance Use Topics  . Alcohol use: Yes    Alcohol/week: 3.0 standard drinks    Types: 3 Standard drinks or equivalent per week    Comment: 3 drinks per week per pt   . Drug use: No    FAMILY HISTORY: Family History  Problem Relation Age of Onset  . Breast cancer Mother        Age 88  . Hypertension Mother   . Depression Mother   . Hypertension Father   . Arthritis Father   . Heart disease Father   . Cancer Father        Spleen  . Sleep apnea Father   . Heart disease  Maternal Grandfather   . Colon cancer Paternal Grandfather        Colon cancer  . Esophageal cancer Neg Hx   . Stomach cancer Neg Hx     ROS: Review of Systems  Constitutional: Negative for weight loss.  Respiratory: Negative for shortness of breath (at rest).   Cardiovascular: Negative for chest pain and orthopnea.       + Feet swelling  Neurological: Negative for dizziness and headaches.    PHYSICAL EXAM: Blood pressure 136/86, pulse 68, temperature 97.8 F (36.6 C), temperature source Oral, height 5\' 6"  (1.676 m), weight 231 lb (104.8 kg), SpO2 98 %. Body mass index is 37.28 kg/m. Physical Exam Vitals signs reviewed.  Constitutional:      Appearance: Normal appearance. She is obese.  Cardiovascular:     Rate and Rhythm: Normal rate.     Pulses: Normal pulses.  Pulmonary:     Effort: Pulmonary effort is normal.     Breath sounds: Normal breath sounds.  Musculoskeletal: Normal range of motion.  Skin:    General: Skin is warm and dry.  Neurological:     Mental Status: She is alert and oriented to person, place, and time.  Psychiatric:        Mood and Affect: Mood normal.        Behavior: Behavior normal.     RECENT LABS AND TESTS: BMET    Component Value Date/Time   NA 144 09/18/2018 1225   K 4.8 09/18/2018 1225   CL 105 09/18/2018 1225   CO2 23  09/18/2018 1225   GLUCOSE 84 09/18/2018 1225   GLUCOSE 93 08/28/2018 1333   BUN 17 09/18/2018 1225   CREATININE 0.73 09/18/2018 1225   CREATININE 0.81 06/08/2016 1353   CALCIUM 9.1 09/18/2018 1225   GFRNONAA 89 09/18/2018 1225   GFRAA 102 09/18/2018 1225   Lab Results  Component Value Date   HGBA1C 5.4 09/18/2018   HGBA1C 5.5 05/28/2018   HGBA1C 5.8 (H) 02/06/2018   Lab Results  Component Value Date   INSULIN 4.5 09/18/2018   INSULIN 6.2 05/28/2018   INSULIN 8.7 02/06/2018   CBC    Component Value Date/Time   WBC 4.8 05/28/2018 0950   WBC 5.0 06/08/2016 1353   RBC 4.63 05/28/2018 0950   RBC 4.94 06/08/2016 1353   HGB 15.6 (H) 08/28/2018 1333   HGB 13.5 05/28/2018 0950   HGB 14.3 04/02/2014 0937   HCT 46.0 08/28/2018 1333   HCT 40.4 05/28/2018 0950   PLT 263 06/08/2016 1353   MCV 87 05/28/2018 0950   MCH 29.2 05/28/2018 0950   MCH 27.5 06/08/2016 1353   MCHC 33.4 05/28/2018 0950   MCHC 31.2 (L) 06/08/2016 1353   RDW 13.4 05/28/2018 0950   LYMPHSABS 1.1 05/28/2018 0950   MONOABS 450 06/08/2016 1353   EOSABS 0.1 05/28/2018 0950   BASOSABS 0.1 05/28/2018 0950   Iron/TIBC/Ferritin/ %Sat No results found for: IRON, TIBC, FERRITIN, IRONPCTSAT Lipid Panel     Component Value Date/Time   CHOL 185 09/18/2018 1225   TRIG 79 09/18/2018 1225   HDL 58 09/18/2018 1225   CHOLHDL 3.1 09/27/2017 1404   CHOLHDL 3.4 06/08/2016 1353   VLDL 19 06/08/2016 1353   LDLCALC 111 (H) 09/18/2018 1225   Hepatic Function Panel     Component Value Date/Time   PROT 6.3 09/18/2018 1225   ALBUMIN 4.4 09/18/2018 1225   AST 17 09/18/2018 1225   ALT 24 09/18/2018 1225   ALKPHOS  84 09/18/2018 1225   BILITOT 0.5 09/18/2018 1225      Component Value Date/Time   TSH 3.430 05/28/2018 0950   TSH 5.100 (H) 02/06/2018 0941   TSH 3.450 09/27/2017 1404      OBESITY BEHAVIORAL INTERVENTION VISIT  Today's visit was # 21   Starting weight: 281 lbs Starting date: 02/06/18 Today's weight  : 231 lbs Today's date: 02/10/2019 Total lbs lost to date: 42    ASK: We discussed the diagnosis of obesity with Cheryl Cheryl [name] today and Cheryl Cheryl [name] agreed to give [name] permission to discuss obesity behavioral modification therapy today.  ASSESS: Cheryl Cheryl [name] has the diagnosis of obesity and her BMI today is 73.3 Cheryl Cheryl [name] is in the action stage of change   ADVISE: Cheryl Cheryl [name] was educated on the multiple health risks of obesity as well as the benefit of weight loss to improve her health. She was advised of the need for long term treatment and the importance of lifestyle modifications to improve her current health and to decrease her risk of future health problems.  AGREE: Multiple dietary modification options and treatment options were discussed and  Cheryl Cheryl [name] agreed to follow the recommendations documented in the above note.  ARRANGE: Cheryl Cheryl [name] was educated on the importance of frequent visits to treat obesity as outlined per CMS and USPSTF guidelines and agreed to schedule her next follow up appointment today.  I, Cheryl Cheryl [name], am acting as transcriptionist for Dennard Nip, MD I have reviewed the above documentation for accuracy and completeness, and I agree with the above. -Dennard Nip, MD

## 2019-02-19 ENCOUNTER — Other Ambulatory Visit: Payer: Self-pay | Admitting: Obstetrics and Gynecology

## 2019-02-19 DIAGNOSIS — I10 Essential (primary) hypertension: Secondary | ICD-10-CM

## 2019-02-19 NOTE — Telephone Encounter (Signed)
Medication refill request: Effexor  Last AEX:  09-27-17 SM Next AEX: 04-28-2019 Last MMG (if hormonal medication request): n/a Refill authorized: Please advise.   Medication pended for #90, 0RF. Please refill if appropriate.

## 2019-03-03 ENCOUNTER — Other Ambulatory Visit: Payer: Self-pay

## 2019-03-03 ENCOUNTER — Encounter (INDEPENDENT_AMBULATORY_CARE_PROVIDER_SITE_OTHER): Payer: Self-pay | Admitting: Family Medicine

## 2019-03-03 ENCOUNTER — Ambulatory Visit (INDEPENDENT_AMBULATORY_CARE_PROVIDER_SITE_OTHER): Payer: Federal, State, Local not specified - PPO | Admitting: Family Medicine

## 2019-03-03 VITALS — BP 108/75 | HR 73 | Temp 97.7°F | Ht 66.0 in | Wt 221.0 lb

## 2019-03-03 DIAGNOSIS — Z6835 Body mass index (BMI) 35.0-35.9, adult: Secondary | ICD-10-CM

## 2019-03-03 DIAGNOSIS — I10 Essential (primary) hypertension: Secondary | ICD-10-CM | POA: Diagnosis not present

## 2019-03-03 DIAGNOSIS — R7303 Prediabetes: Secondary | ICD-10-CM | POA: Diagnosis not present

## 2019-03-03 DIAGNOSIS — Z9189 Other specified personal risk factors, not elsewhere classified: Secondary | ICD-10-CM

## 2019-03-03 MED ORDER — METFORMIN HCL 500 MG PO TABS: ORAL_TABLET | ORAL | 0 refills | Status: DC

## 2019-03-03 MED ORDER — LOSARTAN POTASSIUM 100 MG PO TABS: 100.0000 mg | ORAL_TABLET | Freq: Every day | ORAL | 0 refills | Status: DC

## 2019-03-04 NOTE — Progress Notes (Signed)
Office: [phone]  /  Fax: [phone]   HPI:   Chief Complaint: OBESITY Cheryl [name] is here to discuss her progress with her obesity treatment plan. She is on the lower carbohydrate, vegetable and lean protein rich diet plan and is following her eating plan approximately 85% of the time. She states she is walking and doing yard work 45 minutes 3 times per week. Cheryl [name] was changed to the low carb diet plan at her last visit and has done well with weight loss. She would like to go back to the Category 2 plan as she misses fruit. Her weight is 221 lb (100.2 kg) today and has had a weight loss of 10 pounds over a period of 3 weeks since her last visit. She has lost 60 lbs since starting treatment with [name].  Pre-Diabetes Cheryl [name] has a diagnosis of prediabetes based on her elevated Hgb A1c and was informed this puts her at greater risk of developing diabetes. She is stable on metformin currently and continues to work on diet and exercise to decrease risk of diabetes. She denies nausea, vomiting, or hypoglycemia. She is doing well with diet and exercise.  Hypertension Cheryl [name] is a 63 y.o. female with hypertension. Cheryl [name] denies chest pain, headache, or dizziness. She is working weight loss to help control her blood pressure with the goal of decreasing her risk of heart attack and stroke. Cheryl [name]'s blood pressure is well controlled on medications, diet, and weight loss.  At risk for diabetes Cheryl [name] is at higher than averagerisk for developing diabetes due to her obesity. She currently denies polyuria or polydipsia.  ASSESSMENT AND PLAN:  Class 2 severe obesity with serious comorbidity and body mass index (BMI) of 35.0 to 35.9 in adult, unspecified obesity type (Cheryl [name])  Prediabetes - Plan: metFORMIN (GLUCOPHAGE) 500 MG tablet  Essential hypertension - Plan: losartan (COZAAR) 100 MG tablet  PLAN:  Pre-Diabetes Cheryl [name] will continue to work on weight loss, exercise, and decreasing  simple carbohydrates in her diet to help decrease the risk of diabetes. We dicussed metformin including benefits and risks. She was informed that eating too many simple carbohydrates or too many calories at one sitting increases the likelihood of GI side effects. Cheryl [name] was given a refill on her metformin 500 mg #60 with 0 refills and agrees to follow-up with our clinic in 2 weeks. She will have labs checked at her next visit.  Hypertension We discussed sodium restriction, working on healthy weight loss, and a regular exercise program as the means to achieve improved blood pressure control. Cheryl [name] agreed with this plan and agreed to follow up as directed. We will continue to monitor her blood pressure as well as her progress with the above lifestyle modifications. Cheryl [name] was given a refill on her losartan 100 mg #30 with 0 refills and agrees to follow-up with our clinic in 2 weeks. She will have labs checked at her next visit and will watch for signs of hypotension as she continues her lifestyle modifications.  Diabetes risk counselling Cheryl [name] was given extended (15 minutes) diabetes prevention counseling today. She is 63 y.o. female and has risk factors for diabetes including obesity. We discussed intensive lifestyle modifications today with an emphasis on weight loss as well as increasing exercise and decreasing simple carbohydrates in her diet.  Obesity Cheryl [name] is currently in the action stage of change. As such, her goal is to continue with weight loss efforts. She has agreed to follow the Category 2 plan. Cheryl [name] has  been instructed to work up to a goal of 150 minutes of combined cardio and strengthening exercise per week for weight loss and overall health benefits. We discussed the following Behavioral Modification Strategies today: increasing lean protein intake, decreasing simple carbohydrates, work on meal planning and easy cooking plans  Cheryl [name] has agreed to follow-up with our clinic in 2  weeks (fasting). She was informed of the importance of frequent follow-up visits to maximize her success with intensive lifestyle modifications for her multiple health conditions.  ALLERGIES: Allergies  Allergen Reactions   Ace Inhibitors Cough   Lisinopril Cough   Neosporin [Neomycin-Bacitracin Zn-Polymyx] Rash   Z-Pak [Azithromycin] Other (See Comments)    thrush    MEDICATIONS: Current Outpatient Medications on File Prior to Visit  Medication Sig Dispense Refill   aspirin 81 MG chewable tablet Chew 81 mg by mouth daily.     Cholecalciferol (VITAMIN D3) 1000 UNITS CAPS Take 1 capsule by mouth daily.      docusate sodium (COLACE) 100 MG capsule Take 100 mg by mouth daily.     Loratadine (CLARITIN PO) Take 1 tablet by mouth daily.      meloxicam (MOBIC) 15 MG tablet Take 1 tablet by mouth at bedtime.  3   nitroGLYCERIN (NITROSTAT) 0.4 MG SL tablet Place 1 tablet (0.4 mg total) under the tongue every 5 (five) minutes as needed for chest pain. 12 tablet 0   UNABLE TO FIND C PAP for sleep apnea      venlafaxine (EFFEXOR) 75 MG tablet TAKE 1 TABLET(75 MG) BY MOUTH DAILY 90 tablet 0   Current Facility-Administered Medications on File Prior to Visit  Medication Dose Route Frequency Provider Last Rate Last Dose   0.9 %  sodium chloride infusion  500 mL Intravenous Once Armbruster, Carlota Raspberry, MD        PAST MEDICAL HISTORY: Past Medical History:  Diagnosis Date   Allergy    Arthritis    Chest pain    Depression    Fatigue    GERD (gastroesophageal reflux disease)    Hyperlipidemia    Hypertension    Joint pain    Obesity    Osteopenia    Palpitation    Sleep apnea    uses CPAP    SOB (shortness of breath)    Urinary incontinence     PAST SURGICAL HISTORY: Past Surgical History:  Procedure Laterality Date   ABDOMINAL HYSTERECTOMY  08/10/2000   TAH/RSO   BREAST SURGERY     Biopsy-Benign   COLOSTOMY  2000   Dr.Patterson   KNEE ARTHROSCOPY      left knee   LAPAROSCOPIC CHOLECYSTECTOMY  2010   LAPAROTOMY  1990   Fibroid removed-ovarian cyst    RHINOPLASTY      SOCIAL HISTORY: Social History   Tobacco Use   Smoking status: Former Smoker    Quit date: 08/15/2007    Years since quitting: 11.5   Smokeless tobacco: Never Used  Substance Use Topics   Alcohol use: Yes    Alcohol/week: 3.0 standard drinks    Types: 3 Standard drinks or equivalent per week    Comment: 3 drinks per week per pt    Drug use: No    FAMILY HISTORY: Family History  Problem Relation Age of Onset   Breast cancer Mother        Age 81   Hypertension Mother    Depression Mother    Hypertension Father    Arthritis Father  Heart disease Father    Cancer Father        Spleen   Sleep apnea Father    Heart disease Maternal Grandfather    Colon cancer Paternal Grandfather        Colon cancer   Esophageal cancer Neg Hx    Stomach cancer Neg Hx    ROS: Review of Systems  Cardiovascular: Negative for chest pain.  Gastrointestinal: Negative for nausea and vomiting.  Neurological: Negative for dizziness and headaches.  Endo/Heme/Allergies:       Negative for hypoglycemia.   PHYSICAL EXAM: Blood pressure 108/75, pulse 73, temperature 97.7 F (36.5 C), temperature source Oral, height 5\' 6"  (1.676 m), weight 221 lb (100.2 kg), SpO2 98 %. Body mass index is 35.67 kg/m. Physical Exam Vitals signs reviewed.  Constitutional:      Appearance: Normal appearance. She is obese.  Cardiovascular:     Rate and Rhythm: Normal rate.     Pulses: Normal pulses.  Pulmonary:     Effort: Pulmonary effort is normal.     Breath sounds: Normal breath sounds.  Musculoskeletal: Normal range of motion.  Skin:    General: Skin is warm and dry.  Neurological:     Mental Status: She is alert and oriented to person, place, and time.  Psychiatric:        Behavior: Behavior normal.   RECENT LABS AND TESTS: BMET    Component Value  Date/Time   NA 144 09/18/2018 1225   K 4.8 09/18/2018 1225   CL 105 09/18/2018 1225   CO2 23 09/18/2018 1225   GLUCOSE 84 09/18/2018 1225   GLUCOSE 93 08/28/2018 1333   BUN 17 09/18/2018 1225   CREATININE 0.73 09/18/2018 1225   CREATININE 0.81 06/08/2016 1353   CALCIUM 9.1 09/18/2018 1225   GFRNONAA 89 09/18/2018 1225   GFRAA 102 09/18/2018 1225   Lab Results  Component Value Date   HGBA1C 5.4 09/18/2018   HGBA1C 5.5 05/28/2018   HGBA1C 5.8 (H) 02/06/2018   Lab Results  Component Value Date   INSULIN 4.5 09/18/2018   INSULIN 6.2 05/28/2018   INSULIN 8.7 02/06/2018   CBC    Component Value Date/Time   WBC 4.8 05/28/2018 0950   WBC 5.0 06/08/2016 1353   RBC 4.63 05/28/2018 0950   RBC 4.94 06/08/2016 1353   HGB 15.6 (H) 08/28/2018 1333   HGB 13.5 05/28/2018 0950   HGB 14.3 04/02/2014 0937   HCT 46.0 08/28/2018 1333   HCT 40.4 05/28/2018 0950   PLT 263 06/08/2016 1353   MCV 87 05/28/2018 0950   MCH 29.2 05/28/2018 0950   MCH 27.5 06/08/2016 1353   MCHC 33.4 05/28/2018 0950   MCHC 31.2 (L) 06/08/2016 1353   RDW 13.4 05/28/2018 0950   LYMPHSABS 1.1 05/28/2018 0950   MONOABS 450 06/08/2016 1353   EOSABS 0.1 05/28/2018 0950   BASOSABS 0.1 05/28/2018 0950   Iron/TIBC/Ferritin/ %Sat No results found for: IRON, TIBC, FERRITIN, IRONPCTSAT Lipid Panel     Component Value Date/Time   CHOL 185 09/18/2018 1225   TRIG 79 09/18/2018 1225   HDL 58 09/18/2018 1225   CHOLHDL 3.1 09/27/2017 1404   CHOLHDL 3.4 06/08/2016 1353   VLDL 19 06/08/2016 1353   LDLCALC 111 (H) 09/18/2018 1225   Hepatic Function Panel     Component Value Date/Time   PROT 6.3 09/18/2018 1225   ALBUMIN 4.4 09/18/2018 1225   AST 17 09/18/2018 1225   ALT 24 09/18/2018 1225  ALKPHOS 84 09/18/2018 1225   BILITOT 0.5 09/18/2018 1225      Component Value Date/Time   TSH 3.430 05/28/2018 0950   TSH 5.100 (H) 02/06/2018 0941   TSH 3.450 09/27/2017 1404   Results for JULENA, BARBOUR "[name]" (MRN  [id number]) as of 03/04/2019 10:56  Ref. Range 09/18/2018 12:25  Vitamin D, 25-Hydroxy Latest Ref Range: 30.0 - 100.0 ng/mL 39.2   OBESITY BEHAVIORAL INTERVENTION VISIT  Today's visit was #22  Starting weight: 281 lbs Starting date: 02/06/2018 Today's weight: 221 lbs  Today's date: 03/03/2019 Total lbs lost to date: 60   03/03/2019  Height 5\' 6"  (1.676 m)  Weight 221 lb (100.2 kg)  BMI (Calculated) 35.69  BLOOD PRESSURE - SYSTOLIC 354  BLOOD PRESSURE - DIASTOLIC 75   Body Fat % 44 %  Total Body Water (lbs) 89.2 lbs   ASK: We discussed the diagnosis of obesity with Tally Due today and Nikea agreed to give [name] permission to discuss obesity behavioral modification therapy today.  ASSESS: Lyrica has the diagnosis of obesity and her BMI today is 35.8. Paislea is in the action stage of change.   ADVISE: Luciann was educated on the multiple health risks of obesity as well as the benefit of weight loss to improve her health. She was advised of the need for long term treatment and the importance of lifestyle modifications to improve her current health and to decrease her risk of future health problems.  AGREE: Multiple dietary modification options and treatment options were discussed and  Ammanda agreed to follow the recommendations documented in the above note.  ARRANGE: Nixon was educated on the importance of frequent visits to treat obesity as outlined per CMS and USPSTF guidelines and agreed to schedule her next follow up appointment today.  I, Michaelene Song, am acting as Location manager for Dennard Nip, MD   I have reviewed the above documentation for accuracy and completeness, and I agree with the above. -Dennard Nip, MD

## 2019-03-18 ENCOUNTER — Other Ambulatory Visit: Payer: Self-pay

## 2019-03-18 ENCOUNTER — Encounter (INDEPENDENT_AMBULATORY_CARE_PROVIDER_SITE_OTHER): Payer: Self-pay | Admitting: Family Medicine

## 2019-03-18 ENCOUNTER — Ambulatory Visit (INDEPENDENT_AMBULATORY_CARE_PROVIDER_SITE_OTHER): Payer: Federal, State, Local not specified - PPO | Admitting: Family Medicine

## 2019-03-18 VITALS — BP 139/81 | HR 71 | Temp 98.0°F | Ht 66.0 in | Wt 225.0 lb

## 2019-03-18 DIAGNOSIS — Z9189 Other specified personal risk factors, not elsewhere classified: Secondary | ICD-10-CM

## 2019-03-18 DIAGNOSIS — E559 Vitamin D deficiency, unspecified: Secondary | ICD-10-CM | POA: Diagnosis not present

## 2019-03-18 DIAGNOSIS — Z6836 Body mass index (BMI) 36.0-36.9, adult: Secondary | ICD-10-CM

## 2019-03-18 DIAGNOSIS — I10 Essential (primary) hypertension: Secondary | ICD-10-CM

## 2019-03-18 DIAGNOSIS — R7303 Prediabetes: Secondary | ICD-10-CM

## 2019-03-18 DIAGNOSIS — E782 Mixed hyperlipidemia: Secondary | ICD-10-CM | POA: Diagnosis not present

## 2019-03-18 DIAGNOSIS — E7849 Other hyperlipidemia: Secondary | ICD-10-CM | POA: Diagnosis not present

## 2019-03-18 NOTE — Progress Notes (Signed)
Office: [phone]  /  Fax: [phone]   HPI:   Chief Complaint: OBESITY Cheryl [name] is here to discuss her progress with her obesity treatment plan. She is on the Category 2 plan and is following her eating plan approximately 50 % of the time. She states she is walking 30 minutes 2 to 3 times per week. Cheryl [name] has struggled to stay on track in the last two to three weeks and she has gained four pounds. She felt deprived on the low carb plan and she over did the carbohydrates when she went back to the Category 2 plan. Cheryl [name] states she will be traveling and she wants to discuss eating strategies. Her weight is 225 lb (102.1 kg) today and has had a weight gain of 4 pounds over a period of 2 weeks since her last visit. She has lost 56 lbs since starting treatment with [name].  Hypertension Cheryl [name] is a 63 y.o. female with hypertension. Her blood pressure is elevated today, and normally her blood pressure is well controlled on medications and diet. Cheryl [name] denies chest pain or headache. She is working weight loss to help control her blood pressure with the goal of decreasing her risk of heart attack and stroke.   Pre-Diabetes Cheryl [name] has a diagnosis of prediabetes based on her elevated Hgb A1c and was informed this puts her at greater risk of developing diabetes. Cheryl [name] is doing well overall on her diet prescription. Cheryl [name] continues to work on diet and exercise to decrease the risk of diabetes. She denies nausea, vomiting or hypoglycemia. Cheryl [name] is due for labs.  At risk for diabetes Cheryl [name] is at higher than average risk for developing diabetes due to her obesity and prediabetes. She currently denies polyuria or polydipsia.  Vitamin D deficiency Cheryl [name] has a diagnosis of vitamin D deficiency. Cheryl [name] is stable on vit D, but she is not yet at goal. Cheryl [name] denies nausea, vomiting or muscle weakness. Cheryl [name] is due for labs.  Mixed Hyperlipidemia not needing statin Cheryl [name] has  hyperlipidemia and she is attempting to improve her cholesterol levels with intensive lifestyle modification including a low saturated fat diet, exercise and weight loss. She denies any chest pain. Cheryl [name] is due for labs.  ASSESSMENT AND PLAN:  Essential hypertension - Plan: Comprehensive metabolic panel  Prediabetes - Plan: Hemoglobin A1c, Insulin, random, Lipid Panel With LDL/HDL Ratio  Vitamin D deficiency - Plan: VITAMIN D 25 Hydroxy (Vit-D Deficiency, Fractures)  Mixed hyperlipidemia - Plan: Lipid Panel With LDL/HDL Ratio  At risk for diabetes mellitus  Class 2 severe obesity with serious comorbidity and body mass index (BMI) of 36.0 to 36.9 in adult, unspecified obesity type (Cheryl [name])  PLAN:  Hypertension We discussed sodium restriction, working on healthy weight loss, and a regular exercise program as the means to achieve improved blood pressure control. Cheryl [name] agreed with this plan and agreed to follow up as directed. We will recheck blood pressure in 2 to 3 weeks and we will continue to monitor her blood pressure as well as her progress with the above lifestyle modifications. She will continue her medications and diet and she will watch for signs of hypotension as she continues her lifestyle modifications.  Pre-Diabetes Cheryl [name] will continue to work on weight loss, exercise, and decreasing simple carbohydrates in her diet to help decrease the risk of diabetes. She was informed that eating too many simple carbohydrates or too many calories at one sitting increases the likelihood of GI side effects. We will check labs  and Cheryl [name] agreed to follow up with [name] as directed to monitor her progress.  Diabetes risk counseling Cheryl [name] was given extended (15 minutes) diabetes prevention counseling today. She is 63 y.o. female and has risk factors for diabetes including obesity and prediabetes. We discussed intensive lifestyle modifications today with an emphasis on weight loss as well as increasing  exercise and decreasing simple carbohydrates in her diet.  Vitamin D Deficiency Cheryl [name] was informed that low vitamin D levels contributes to fatigue and are associated with obesity, breast, and colon cancer. Cheryl [name] will continue OTC vitamin D and she will follow up for routine testing of vitamin D, at least 2-3 times per year. She was informed of the risk of over-replacement of vitamin D and agrees to not increase her dose unless she discusses this with [name] first. We will check labs and follow.  Mixed Hyperlipidemia not needing statin Cheryl [name] was informed of the American Heart Association Guidelines emphasizing intensive lifestyle modifications as the first line treatment for hyperlipidemia. We discussed many lifestyle modifications today in depth, and Cheryl [name] will work on decreasing saturated fats such as fatty red meat, butter and many fried foods. She will also increase vegetables and lean protein in her diet and continue to work on exercise and weight loss efforts. We will check labs and Cheryl [name] will follow up with our clinic in 2 to 3 weeks.  Obesity Cheryl [name] is currently in the action stage of change. As such, her goal is to continue with weight loss efforts She has agreed to follow the Category 2 plan Cheryl [name] has been instructed to work up to a goal of 150 minutes of combined cardio and strengthening exercise per week for weight loss and overall health benefits. We discussed the following Behavioral Modification Strategies today: travel eating strategies and celebration eating strategies  Cheryl [name] has agreed to follow up with our clinic in 2 to 3 weeks. She was informed of the importance of frequent follow up visits to maximize her success with intensive lifestyle modifications for her multiple health conditions.  ALLERGIES: Allergies  Allergen Reactions   Ace Inhibitors Cough   Lisinopril Cough   Neosporin [Neomycin-Bacitracin Zn-Polymyx] Rash   Z-Pak [Azithromycin] Other (See Comments)     thrush    MEDICATIONS: Current Outpatient Medications on File Prior to Visit  Medication Sig Dispense Refill   aspirin 81 MG chewable tablet Chew 81 mg by mouth daily.     Cholecalciferol (VITAMIN D3) 1000 UNITS CAPS Take 1 capsule by mouth daily.      docusate sodium (COLACE) 100 MG capsule Take 100 mg by mouth daily.     Loratadine (CLARITIN PO) Take 1 tablet by mouth daily.      losartan (COZAAR) 100 MG tablet Take 1 tablet (100 mg total) by mouth daily. 30 tablet 0   meloxicam (MOBIC) 15 MG tablet Take 1 tablet by mouth at bedtime.  3   metFORMIN (GLUCOPHAGE) 500 MG tablet TAKE 1 TABLET(500 MG) BY MOUTH TWICE DAILY WITH A MEAL 60 tablet 0   nitroGLYCERIN (NITROSTAT) 0.4 MG SL tablet Place 1 tablet (0.4 mg total) under the tongue every 5 (five) minutes as needed for chest pain. 12 tablet 0   UNABLE TO FIND C PAP for sleep apnea      venlafaxine (EFFEXOR) 75 MG tablet TAKE 1 TABLET(75 MG) BY MOUTH DAILY 90 tablet 0   Current Facility-Administered Medications on File Prior to Visit  Medication Dose Route Frequency Provider Last Rate Last Dose   0.9 %  sodium chloride infusion  500 mL Intravenous Once Armbruster, Carlota Raspberry, MD        PAST MEDICAL HISTORY: Past Medical History:  Diagnosis Date   Allergy    Arthritis    Chest pain    Depression    Fatigue    GERD (gastroesophageal reflux disease)    Hyperlipidemia    Hypertension    Joint pain    Obesity    Osteopenia    Palpitation    Sleep apnea    uses CPAP    SOB (shortness of breath)    Urinary incontinence     PAST SURGICAL HISTORY: Past Surgical History:  Procedure Laterality Date   ABDOMINAL HYSTERECTOMY  08/10/2000   TAH/RSO   BREAST SURGERY     Biopsy-Benign   COLOSTOMY  2000   Dr.Patterson   KNEE ARTHROSCOPY     left knee   LAPAROSCOPIC CHOLECYSTECTOMY  2010   LAPAROTOMY  1990   Fibroid removed-ovarian cyst    RHINOPLASTY      SOCIAL HISTORY: Social History    Tobacco Use   Smoking status: Former Smoker    Quit date: 08/15/2007    Years since quitting: 11.5   Smokeless tobacco: Never Used  Substance Use Topics   Alcohol use: Yes    Alcohol/week: 3.0 standard drinks    Types: 3 Standard drinks or equivalent per week    Comment: 3 drinks per week per pt    Drug use: No    FAMILY HISTORY: Family History  Problem Relation Age of Onset   Breast cancer Mother        Age 63   Hypertension Mother    Depression Mother    Hypertension Father    Arthritis Father    Heart disease Father    Cancer Father        Spleen   Sleep apnea Father    Heart disease Maternal Grandfather    Colon cancer Paternal Grandfather        Colon cancer   Esophageal cancer Neg Hx    Stomach cancer Neg Hx     ROS: Review of Systems  Constitutional: Negative for weight loss.  Cardiovascular: Negative for chest pain.  Gastrointestinal: Negative for nausea and vomiting.  Genitourinary: Negative for frequency.  Musculoskeletal: Negative for myalgias.       Negative for muscle weakness  Neurological: Negative for headaches.  Endo/Heme/Allergies: Negative for polydipsia.       Negative for hypoglycemia    PHYSICAL EXAM: Blood pressure 139/81, pulse 71, temperature 98 F (36.7 C), temperature source Oral, height 5\' 6"  (1.676 m), weight 225 lb (102.1 kg), SpO2 99 %. Body mass index is 36.32 kg/m. Physical Exam Vitals signs reviewed.  Constitutional:      Appearance: Normal appearance. She is well-developed. She is obese.  Cardiovascular:     Rate and Rhythm: Normal rate.  Pulmonary:     Effort: Pulmonary effort is normal.  Musculoskeletal: Normal range of motion.  Skin:    General: Skin is warm and dry.  Neurological:     Mental Status: She is alert and oriented to person, place, and time.  Psychiatric:        Mood and Affect: Mood normal.        Behavior: Behavior normal.     RECENT LABS AND TESTS: BMET    Component Value  Date/Time   NA 144 09/18/2018 1225   K 4.8 09/18/2018 1225   CL 105 09/18/2018 1225  CO2 23 09/18/2018 1225   GLUCOSE 84 09/18/2018 1225   GLUCOSE 93 08/28/2018 1333   BUN 17 09/18/2018 1225   CREATININE 0.73 09/18/2018 1225   CREATININE 0.81 06/08/2016 1353   CALCIUM 9.1 09/18/2018 1225   GFRNONAA 89 09/18/2018 1225   GFRAA 102 09/18/2018 1225   Lab Results  Component Value Date   HGBA1C 5.4 09/18/2018   HGBA1C 5.5 05/28/2018   HGBA1C 5.8 (H) 02/06/2018   Lab Results  Component Value Date   INSULIN 4.5 09/18/2018   INSULIN 6.2 05/28/2018   INSULIN 8.7 02/06/2018   CBC    Component Value Date/Time   WBC 4.8 05/28/2018 0950   WBC 5.0 06/08/2016 1353   RBC 4.63 05/28/2018 0950   RBC 4.94 06/08/2016 1353   HGB 15.6 (H) 08/28/2018 1333   HGB 13.5 05/28/2018 0950   HGB 14.3 04/02/2014 0937   HCT 46.0 08/28/2018 1333   HCT 40.4 05/28/2018 0950   PLT 263 06/08/2016 1353   MCV 87 05/28/2018 0950   MCH 29.2 05/28/2018 0950   MCH 27.5 06/08/2016 1353   MCHC 33.4 05/28/2018 0950   MCHC 31.2 (L) 06/08/2016 1353   RDW 13.4 05/28/2018 0950   LYMPHSABS 1.1 05/28/2018 0950   MONOABS 450 06/08/2016 1353   EOSABS 0.1 05/28/2018 0950   BASOSABS 0.1 05/28/2018 0950   Iron/TIBC/Ferritin/ %Sat No results found for: IRON, TIBC, FERRITIN, IRONPCTSAT Lipid Panel     Component Value Date/Time   CHOL 185 09/18/2018 1225   TRIG 79 09/18/2018 1225   HDL 58 09/18/2018 1225   CHOLHDL 3.1 09/27/2017 1404   CHOLHDL 3.4 06/08/2016 1353   VLDL 19 06/08/2016 1353   LDLCALC 111 (H) 09/18/2018 1225   Hepatic Function Panel     Component Value Date/Time   PROT 6.3 09/18/2018 1225   ALBUMIN 4.4 09/18/2018 1225   AST 17 09/18/2018 1225   ALT 24 09/18/2018 1225   ALKPHOS 84 09/18/2018 1225   BILITOT 0.5 09/18/2018 1225      Component Value Date/Time   TSH 3.430 05/28/2018 0950   TSH 5.100 (H) 02/06/2018 0941   TSH 3.450 09/27/2017 1404    Results for AIGNER, HORSEMAN "[name]"  (MRN [id number]) as of 03/18/2019 15:53  Ref. Range 09/18/2018 12:25  Vitamin D, 25-Hydroxy Latest Ref Range: 30.0 - 100.0 ng/mL 39.2    OBESITY BEHAVIORAL INTERVENTION VISIT  Today's visit was # 23  Starting weight: 281 lbs Starting date: 02/06/2018 Today's weight : 225 lbs Today's date: 03/18/2019 Total lbs lost to date: 56    03/18/2019  Height 5\' 6"  (1.676 m)  Weight 225 lb (102.1 kg)  BMI (Calculated) 36.33  BLOOD PRESSURE - SYSTOLIC 387  BLOOD PRESSURE - DIASTOLIC 81   Body Fat % 37 %  Total Body Water (lbs) 85.8 lbs    ASK: We discussed the diagnosis of obesity with Tally Due today and Raylyn agreed to give [name] permission to discuss obesity behavioral modification therapy today.  ASSESS: Nia has the diagnosis of obesity and her BMI today is 36.33 Anber is in the action stage of change   ADVISE: Ciella was educated on the multiple health risks of obesity as well as the benefit of weight loss to improve her health. She was advised of the need for long term treatment and the importance of lifestyle modifications to improve her current health and to decrease her risk of future health problems.  AGREE: Multiple dietary modification options and treatment options were discussed  and  Oluwatomisin agreed to follow the recommendations documented in the above note.  ARRANGE: Christia was educated on the importance of frequent visits to treat obesity as outlined per CMS and USPSTF guidelines and agreed to schedule her next follow up appointment today.  I, Doreene Nest, am acting as transcriptionist for Dennard Nip, MD I have reviewed the above documentation for accuracy and completeness, and I agree with the above. -Dennard Nip, MD

## 2019-03-19 LAB — COMPREHENSIVE METABOLIC PANEL
ALT: 14 IU/L (ref 0–32)
AST: 13 IU/L (ref 0–40)
Albumin/Globulin Ratio: 2 (ref 1.2–2.2)
Albumin: 4.5 g/dL (ref 3.8–4.8)
Alkaline Phosphatase: 81 IU/L (ref 39–117)
BUN/Creatinine Ratio: 34 — ABNORMAL HIGH (ref 12–28)
BUN: 28 mg/dL — ABNORMAL HIGH (ref 8–27)
Bilirubin Total: 0.3 mg/dL (ref 0.0–1.2)
CO2: 24 mmol/L (ref 20–29)
Calcium: 9.9 mg/dL (ref 8.7–10.3)
Chloride: 108 mmol/L — ABNORMAL HIGH (ref 96–106)
Creatinine, Ser: 0.83 mg/dL (ref 0.57–1.00)
GFR calc Af Amer: 87 mL/min/{1.73_m2} (ref >59)
GFR calc non Af Amer: 76 mL/min/{1.73_m2} (ref >59)
Globulin, Total: 2.2 g/dL (ref 1.5–4.5)
Glucose: 105 mg/dL — ABNORMAL HIGH (ref 65–99)
Potassium: 4.9 mmol/L (ref 3.5–5.2)
Sodium: 147 mmol/L — ABNORMAL HIGH (ref 134–144)
Total Protein: 6.7 g/dL (ref 6.0–8.5)

## 2019-03-19 LAB — INSULIN, RANDOM: INSULIN: 13 u[IU]/mL (ref 2.6–24.9)

## 2019-03-19 LAB — CBC WITH DIFFERENTIAL
Basophils Absolute: 0.1 10*3/uL (ref 0.0–0.2)
Basos: 1 %
EOS (ABSOLUTE): 0.2 10*3/uL (ref 0.0–0.4)
Eos: 3 %
Hematocrit: 41.6 % (ref 34.0–46.6)
Hemoglobin: 13.6 g/dL (ref 11.1–15.9)
Immature Grans (Abs): 0 10*3/uL (ref 0.0–0.1)
Immature Granulocytes: 0 %
Lymphocytes Absolute: 1.3 10*3/uL (ref 0.7–3.1)
Lymphs: 22 %
MCH: 29.8 pg (ref 26.6–33.0)
MCHC: 32.7 g/dL (ref 31.5–35.7)
MCV: 91 fL (ref 79–97)
Monocytes Absolute: 0.5 10*3/uL (ref 0.1–0.9)
Monocytes: 8 %
Neutrophils Absolute: 3.9 10*3/uL (ref 1.4–7.0)
Neutrophils: 66 %
RBC: 4.56 x10E6/uL (ref 3.77–5.28)
RDW: 13.8 % (ref 11.7–15.4)
WBC: 5.9 10*3/uL (ref 3.4–10.8)

## 2019-03-19 LAB — LIPID PANEL WITH LDL/HDL RATIO
Cholesterol, Total: 218 mg/dL — ABNORMAL HIGH (ref 100–199)
HDL: 65 mg/dL (ref >39)
LDL Calculated: 130 mg/dL — ABNORMAL HIGH (ref 0–99)
LDl/HDL Ratio: 2 ratio (ref 0.0–3.2)
Triglycerides: 114 mg/dL (ref 0–149)
VLDL Cholesterol Cal: 23 mg/dL (ref 5–40)

## 2019-03-19 LAB — HEMOGLOBIN A1C
Est. average glucose Bld gHb Est-mCnc: 105 mg/dL
Hgb A1c MFr Bld: 5.3 % (ref 4.8–5.6)

## 2019-03-19 LAB — T3: T3, Total: 96 ng/dL (ref 71–180)

## 2019-03-19 LAB — VITAMIN D 25 HYDROXY (VIT D DEFICIENCY, FRACTURES): Vit D, 25-Hydroxy: 56.7 ng/mL (ref 30.0–100.0)

## 2019-03-19 LAB — TSH: TSH: 5.91 u[IU]/mL — ABNORMAL HIGH (ref 0.450–4.500)

## 2019-03-19 LAB — T4, FREE: Free T4: 0.96 ng/dL (ref 0.82–1.77)

## 2019-04-09 ENCOUNTER — Telehealth (INDEPENDENT_AMBULATORY_CARE_PROVIDER_SITE_OTHER): Payer: Federal, State, Local not specified - PPO | Admitting: Family Medicine

## 2019-04-09 ENCOUNTER — Other Ambulatory Visit (INDEPENDENT_AMBULATORY_CARE_PROVIDER_SITE_OTHER): Payer: Self-pay | Admitting: Family Medicine

## 2019-04-09 ENCOUNTER — Encounter (INDEPENDENT_AMBULATORY_CARE_PROVIDER_SITE_OTHER): Payer: Self-pay | Admitting: Family Medicine

## 2019-04-09 ENCOUNTER — Ambulatory Visit (INDEPENDENT_AMBULATORY_CARE_PROVIDER_SITE_OTHER): Payer: Federal, State, Local not specified - PPO | Admitting: Family Medicine

## 2019-04-09 ENCOUNTER — Other Ambulatory Visit: Payer: Self-pay

## 2019-04-09 VITALS — BP 109/73 | HR 79 | Temp 97.5°F | Ht 66.0 in | Wt 224.6 lb

## 2019-04-09 DIAGNOSIS — I10 Essential (primary) hypertension: Secondary | ICD-10-CM | POA: Diagnosis not present

## 2019-04-09 DIAGNOSIS — R7989 Other specified abnormal findings of blood chemistry: Secondary | ICD-10-CM

## 2019-04-09 DIAGNOSIS — F3289 Other specified depressive episodes: Secondary | ICD-10-CM

## 2019-04-09 DIAGNOSIS — Z9189 Other specified personal risk factors, not elsewhere classified: Secondary | ICD-10-CM

## 2019-04-09 DIAGNOSIS — E88819 Insulin resistance, unspecified: Secondary | ICD-10-CM

## 2019-04-09 DIAGNOSIS — Z6836 Body mass index (BMI) 36.0-36.9, adult: Secondary | ICD-10-CM

## 2019-04-09 DIAGNOSIS — E8881 Metabolic syndrome: Secondary | ICD-10-CM | POA: Diagnosis not present

## 2019-04-09 DIAGNOSIS — R7303 Prediabetes: Secondary | ICD-10-CM | POA: Diagnosis not present

## 2019-04-09 DIAGNOSIS — E559 Vitamin D deficiency, unspecified: Secondary | ICD-10-CM | POA: Diagnosis not present

## 2019-04-09 DIAGNOSIS — E66812 Obesity, class 2: Secondary | ICD-10-CM

## 2019-04-09 MED ORDER — METFORMIN HCL 500 MG PO TABS: ORAL_TABLET | ORAL | 0 refills | Status: DC

## 2019-04-09 MED ORDER — LOSARTAN POTASSIUM 100 MG PO TABS: 100.0000 mg | ORAL_TABLET | Freq: Every day | ORAL | 0 refills | Status: DC

## 2019-04-09 NOTE — Progress Notes (Signed)
Office: [phone]  /  Fax: [phone]   HPI:   Chief Complaint: OBESITY Cheryl Cheryl [name] is here to discuss her progress with her obesity treatment plan. She is on the Category 1 plan and is following her eating plan approximately 100 % of the time. She states she is exercising 0 minutes 0 times per week. Cheryl Cheryl [name] had been on the low carbohydrate plan, but has gone back to Category 1. When she first went off of the lower crab plan she reports she ate too many carbs. She denies polyphagia. She has noted increased stress eating Cheryl [name] to the start of school. She works as the Higher education careers adviser at [employer].   Her weight is 224 lb 9.6 oz (101.9 kg) today and has had a weight loss of 1 pound over a period of 3 weeks since her last visit. She has lost 57 lbs since starting treatment with [name].  Hypertension Cheryl Cheryl [name] is a 63 y.o. female with hypertension. Cheryl Cheryl [name]'s blood pressure is well controlled. She denies chest pain or shortness of breath. Her ASCVD risk is 4.3 %. She is working on weight loss to help control her blood pressure with the goal of decreasing her risk of heart attack and stroke.  The 10-year ASCVD risk score Cheryl Cheryl [name] DC [name]., et al., 2013) is: 4.3%   Values used to calculate the score:     Age: 61 years     Sex: Female     Is Non-Hispanic African American: No     Diabetic: No     Tobacco smoker: No     Systolic Blood Pressure: 0000000 mmHg     Is BP treated: Yes     HDL Cholesterol: 65 mg/dL     Total Cholesterol: 218 mg/dL  At risk for cardiovascular disease Cheryl Cheryl [name] is at a higher than average risk for cardiovascular disease Cheryl [name] to obesity and hypertension. She currently denies any chest pain.  Elevated TSH  Cheryl Cheryl [name]'s TSH is very slightly elevated. She has been on Synthroid in the past, and this was discontinued as thyroid function normalized. Last TSH was normal on 05/28/18. She denies hot or cold intolerance or palpitations.  Insulin Resistance Cheryl Cheryl [name] has a diagnosis of insulin  resistance based on her elevated fasting insulin level >5. Although Cheryl Cheryl [name]'s blood glucose readings are still under good control, insulin resistance puts her at greater risk of metabolic syndrome and diabetes. She is taking metformin currently and denies polyphagia. Last fasting glucose was elevated at 10.5 on 03/18/2019. She continues to work on diet and exercise to decrease risk of diabetes. Lab Results  Component Value Date   HGBA1C 5.3 03/18/2019    Depression with Emotional Eating Behaviors Cheryl Cheryl [name] notes increased stress eating Cheryl [name] to the start of school. She was offered bupropion but declined. Cheryl Cheryl [name] struggles with emotional eating and using food for comfort to the extent that it is negatively impacting her health. She often snacks when she is not hungry. Cheryl Cheryl [name] sometimes feels she is out of control and then feels guilty that she made poor food choices. She has been working on behavior modification techniques to help reduce her emotional eating and has been somewhat successful. She shows no sign of suicidal or homicidal ideations.  Depression screen PHQ 2/9 02/06/2018  Decreased Interest 3  Down, Depressed, Hopeless 1  PHQ - 2 Score 4  Altered sleeping 2  Tired, decreased energy 3  Change in appetite 1  Feeling bad or failure about yourself  1  Trouble concentrating 1  Moving  slowly or fidgety/restless 1  Suicidal thoughts 0  PHQ-9 Score 13  Difficult doing work/chores Somewhat difficult    ASSESSMENT AND PLAN:  Essential hypertension - Plan: losartan (COZAAR) 100 MG tablet  Insulin resistance - Plan: metFORMIN (GLUCOPHAGE) 500 MG tablet  TSH elevation  Other depression - with emotional eating   At risk for heart disease  Class 2 severe obesity with serious comorbidity and body mass index (BMI) of 36.0 to 36.9 in adult, unspecified obesity type (Cheryl Cheryl [name])  PLAN:  Hypertension We discussed sodium restriction, working on healthy weight loss, and a regular exercise program as the  means to achieve improved blood pressure control. Cheryl Cheryl [name] agreed with this plan and agreed to follow up as directed. We will continue to monitor her blood pressure as well as her progress with the above lifestyle modifications.Cheryl Cheryl [name] agrees to start losartan 100 mg qd #30 with no refills. She will watch for signs of hypotension as she continues her lifestyle modifications. Cheryl Cheryl [name] agrees to follow up with our clinic in 2 weeks.  Cardiovascular risk counseling Cheryl Cheryl [name] was given extended (15 minutes) coronary artery disease prevention counseling today. She is 63 y.o. female and has risk factors for heart disease including obesity and hypertension. We discussed intensive lifestyle modifications today with an emphasis on specific weight loss instructions and strategies. Pt was also informed of the importance of increasing exercise and decreasing saturated fats to help prevent heart disease.  Elevated TSH  Cheryl Cheryl [name] was informed of the importance of good thyroid control to help with weight loss efforts. She was also informed that supertheraputic thyroid levels are dangerous and will not improve weight loss results. We will continue to monitor and recheck labs in 3 months.  Insulin Resistance Cheryl Cheryl [name] will continue to work on weight loss, exercise, and decreasing simple carbohydrates in her diet to help decrease the risk of diabetes. We dicussed metformin including benefits and risks. She was informed that eating too many simple carbohydrates or too many calories at one sitting increases the likelihood of GI side effects. Cheryl [name] agrees to continue taking metformin, and I provided handouts on insulin resistance and pre-diabetes mellitus. Cheryl Cheryl [name] agrees to follow up with our clinic in 2 weeks as directed to monitor her progress.  Depression with Emotional Eating Behaviors We discussed behavior modification techniques today to help Cheryl Cheryl [name] deal with her emotional eating and depression. She was given a handout on emotional  eating. Cheryl Cheryl [name] agrees to follow up with our clinic in 2 weeks.  Obesity Cheryl Cheryl [name] is currently in the action stage of change. As such, her goal is to continue with weight loss efforts She has agreed to follow the Category 1 plan Cheryl Cheryl [name] has been instructed to work up to a goal of 150 minutes of combined cardio and strengthening exercise per week or increase walking for weight loss and overall health benefits. We discussed the following Behavioral Modification Strategies today: decreasing simple carbohydrates and planning for success   Cheryl Cheryl [name] has agreed to follow up with our clinic in 2 weeks. She was informed of the importance of frequent follow up visits to maximize her success with intensive lifestyle modifications for her multiple health conditions.  ALLERGIES: Allergies  Allergen Reactions  . Ace Inhibitors Cough  . Lisinopril Cough  . Neosporin [Neomycin-Bacitracin Zn-Polymyx] Rash  . Z-Pak [Azithromycin] Other (See Comments)    thrush    MEDICATIONS: Current Outpatient Medications on File Prior to Visit  Medication Sig Dispense Refill  . aspirin 81 MG chewable tablet Chew 81 mg by mouth daily.    Marland Kitchen  Cholecalciferol (VITAMIN D3) 1000 UNITS CAPS Take 1 capsule by mouth daily.     Marland Kitchen docusate sodium (COLACE) 100 MG capsule Take 100 mg by mouth daily.    . Loratadine (CLARITIN PO) Take 1 tablet by mouth daily.     . meloxicam (MOBIC) 15 MG tablet Take 1 tablet by mouth at bedtime.  3  . nitroGLYCERIN (NITROSTAT) 0.4 MG SL tablet Place 1 tablet (0.4 mg total) under the tongue every 5 (five) minutes as needed for chest pain. 12 tablet 0  . UNABLE TO FIND C PAP for sleep apnea     . venlafaxine (EFFEXOR) 75 MG tablet TAKE 1 TABLET(75 MG) BY MOUTH DAILY 90 tablet 0   Current Facility-Administered Medications on File Prior to Visit  Medication Dose Route Frequency Provider Last Rate Last Dose  . 0.9 %  sodium chloride infusion  500 mL Intravenous Once Armbruster, Carlota Raspberry, MD         PAST MEDICAL HISTORY: Past Medical History:  Diagnosis Date  . Allergy   . Arthritis   . Chest pain   . Depression   . Fatigue   . GERD (gastroesophageal reflux disease)   . Hyperlipidemia   . Hypertension   . Joint pain   . Obesity   . Osteopenia   . Palpitation   . Sleep apnea    uses CPAP   . SOB (shortness of breath)   . Urinary incontinence     PAST SURGICAL HISTORY: Past Surgical History:  Procedure Laterality Date  . ABDOMINAL HYSTERECTOMY  08/10/2000   TAH/RSO  . BREAST SURGERY     Biopsy-Benign  . COLOSTOMY  2000   Dr.Patterson  . KNEE ARTHROSCOPY     left knee  . LAPAROSCOPIC CHOLECYSTECTOMY  2010  . LAPAROTOMY  1990   Fibroid removed-ovarian cyst   . RHINOPLASTY      SOCIAL HISTORY: Social History   Tobacco Use  . Smoking status: Former Smoker    Quit date: 08/15/2007    Years since quitting: 11.6  . Smokeless tobacco: Never Used  Substance Use Topics  . Alcohol use: Yes    Alcohol/week: 3.0 standard drinks    Types: 3 Standard drinks or equivalent per week    Comment: 3 drinks per week per pt   . Drug use: No    FAMILY HISTORY: Family History  Problem Relation Age of Onset  . Breast cancer Mother        Age 41  . Hypertension Mother   . Depression Mother   . Hypertension Father   . Arthritis Father   . Heart disease Father   . Cancer Father        Spleen  . Sleep apnea Father   . Heart disease Maternal Grandfather   . Colon cancer Paternal Grandfather        Colon cancer  . Esophageal cancer Neg Hx   . Stomach cancer Neg Hx     ROS: Review of Systems  Constitutional: Positive for weight loss.  Respiratory: Negative for shortness of breath.   Cardiovascular: Negative for chest pain and palpitations.  Endo/Heme/Allergies:       Negative hot/cold intolerance Negative polyphagia  Psychiatric/Behavioral: Positive for depression. Negative for suicidal ideas.    PHYSICAL EXAM: Blood pressure 109/73, pulse 79, temperature (!)  97.5 F (36.4 C), temperature source Oral, height 5\' 6"  (1.676 m), weight 224 lb 9.6 oz (101.9 kg), SpO2 98 %. Body mass index is 36.25 kg/m. Physical Exam  Vitals signs reviewed.  Constitutional:      Appearance: Normal appearance. She is obese.  Cardiovascular:     Rate and Rhythm: Normal rate.     Pulses: Normal pulses.  Pulmonary:     Effort: Pulmonary effort is normal.     Breath sounds: Normal breath sounds.  Musculoskeletal: Normal range of motion.  Skin:    General: Skin is warm and dry.  Neurological:     Mental Status: She is alert and oriented to person, place, and time.  Psychiatric:        Mood and Affect: Mood normal.        Behavior: Behavior normal.     RECENT LABS AND TESTS: BMET    Component Value Date/Time   NA 147 (H) 03/18/2019 0820   K 4.9 03/18/2019 0820   CL 108 (H) 03/18/2019 0820   CO2 24 03/18/2019 0820   GLUCOSE 105 (H) 03/18/2019 0820   GLUCOSE 93 08/28/2018 1333   BUN 28 (H) 03/18/2019 0820   CREATININE 0.83 03/18/2019 0820   CREATININE 0.81 06/08/2016 1353   CALCIUM 9.9 03/18/2019 0820   GFRNONAA 76 03/18/2019 0820   GFRAA 87 03/18/2019 0820   Lab Results  Component Value Date   HGBA1C 5.3 03/18/2019   HGBA1C 5.4 09/18/2018   HGBA1C 5.5 05/28/2018   HGBA1C 5.8 (H) 02/06/2018   Lab Results  Component Value Date   INSULIN 13.0 03/18/2019   INSULIN 4.5 09/18/2018   INSULIN 6.2 05/28/2018   INSULIN 8.7 02/06/2018   CBC    Component Value Date/Time   WBC 5.9 03/18/2019 0820   WBC 5.0 06/08/2016 1353   RBC 4.56 03/18/2019 0820   RBC 4.94 06/08/2016 1353   HGB 13.6 03/18/2019 0820   HGB 14.3 04/02/2014 0937   HCT 41.6 03/18/2019 0820   PLT 263 06/08/2016 1353   MCV 91 03/18/2019 0820   MCH 29.8 03/18/2019 0820   MCH 27.5 06/08/2016 1353   MCHC 32.7 03/18/2019 0820   MCHC 31.2 (L) 06/08/2016 1353   RDW 13.8 03/18/2019 0820   LYMPHSABS 1.3 03/18/2019 0820   MONOABS 450 06/08/2016 1353   EOSABS 0.2 03/18/2019 0820    BASOSABS 0.1 03/18/2019 0820   Iron/TIBC/Ferritin/ %Sat No results found for: IRON, TIBC, FERRITIN, IRONPCTSAT Lipid Panel     Component Value Date/Time   CHOL 218 (H) 03/18/2019 0820   TRIG 114 03/18/2019 0820   HDL 65 03/18/2019 0820   CHOLHDL 3.1 09/27/2017 1404   CHOLHDL 3.4 06/08/2016 1353   VLDL 19 06/08/2016 1353   LDLCALC 130 (H) 03/18/2019 0820   Hepatic Function Panel     Component Value Date/Time   PROT 6.7 03/18/2019 0820   ALBUMIN 4.5 03/18/2019 0820   AST 13 03/18/2019 0820   ALT 14 03/18/2019 0820   ALKPHOS 81 03/18/2019 0820   BILITOT 0.3 03/18/2019 0820      Component Value Date/Time   TSH 5.910 (H) 03/18/2019 0820   TSH 3.430 05/28/2018 0950   TSH 5.100 (H) 02/06/2018 0941      OBESITY BEHAVIORAL INTERVENTION VISIT  Today's visit was # 24   Starting weight: 281 lbs Starting date: 02/06/18 Today's weight : 224 lbs Today's date: 04/09/2019 Total lbs lost to date: 40    ASK: We discussed the diagnosis of obesity with Cheryl Cheryl [name] today and Cheryl Cheryl [name] agreed to give [name] permission to discuss obesity behavioral modification therapy today.  ASSESS: Cheryl Cheryl [name] has the diagnosis of obesity and her BMI today  is 36.27 Cheryl Cheryl [name] is in the action stage of change   ADVISE: Ed was educated on the multiple health risks of obesity as well as the benefit of weight loss to improve her health. She was advised of the need for long term treatment and the importance of lifestyle modifications to improve her current health and to decrease her risk of future health problems.  AGREE: Multiple dietary modification options and treatment options were discussed and  Kiannah agreed to follow the recommendations documented in the above note.  ARRANGE: Romonia was educated on the importance of frequent visits to treat obesity as outlined per CMS and USPSTF guidelines and agreed to schedule her next follow up appointment today.  Wilhemena Durie, am acting as transcriptionist  for [employer], FNP-C  I have reviewed the above documentation for accuracy and completeness, and I agree with the above.  - Sheanna Dail, FNP-C.

## 2019-04-10 ENCOUNTER — Encounter (INDEPENDENT_AMBULATORY_CARE_PROVIDER_SITE_OTHER): Payer: Self-pay | Admitting: Family Medicine

## 2019-04-10 DIAGNOSIS — F32A Depression, unspecified: Secondary | ICD-10-CM | POA: Insufficient documentation

## 2019-04-10 DIAGNOSIS — F329 Major depressive disorder, single episode, unspecified: Secondary | ICD-10-CM | POA: Insufficient documentation

## 2019-04-23 ENCOUNTER — Encounter (INDEPENDENT_AMBULATORY_CARE_PROVIDER_SITE_OTHER): Payer: Self-pay | Admitting: Family Medicine

## 2019-04-23 ENCOUNTER — Other Ambulatory Visit: Payer: Self-pay

## 2019-04-23 ENCOUNTER — Telehealth (INDEPENDENT_AMBULATORY_CARE_PROVIDER_SITE_OTHER): Payer: Federal, State, Local not specified - PPO | Admitting: Family Medicine

## 2019-04-23 DIAGNOSIS — E559 Vitamin D deficiency, unspecified: Secondary | ICD-10-CM

## 2019-04-23 DIAGNOSIS — E860 Dehydration: Secondary | ICD-10-CM | POA: Diagnosis not present

## 2019-04-23 DIAGNOSIS — Z6835 Body mass index (BMI) 35.0-35.9, adult: Secondary | ICD-10-CM | POA: Diagnosis not present

## 2019-04-24 ENCOUNTER — Other Ambulatory Visit: Payer: Self-pay

## 2019-04-24 NOTE — Progress Notes (Signed)
Office: [phone]  /  Fax: [phone] TeleHealth Visit:  Cheryl [name] has verbally consented to this TeleHealth visit today. The patient is located at home, the provider is located at the [facility] office. The participants in this visit include the listed provider and patient. The visit was conducted today via Webex.  HPI:   Chief Complaint: OBESITY Cheryl [name] is here to discuss her progress with her obesity treatment plan. She is on the Category 1 plan and is following her eating plan approximately 70-75% of the time. She states she is exercising 0 minutes 0 times per week. Cheryl [name] has done well maintaining her weight. She has lost a nice amount of weight with Cheryl [name] but would like to get more organized and lose a little more before the holidays this year. We were unable to weigh the patient today for this TeleHealth visit. She feels as if she has maintained her weight since her last visit. She has lost 57 lbs since starting treatment with Cheryl [name].  Vitamin D deficiency Sharletta has a diagnosis of Vitamin D deficiency, which is not yet at goal. Her level is slowly improving on Vit D and she denies nausea, vomiting or muscle weakness.  Dehydration Starla has been working on increasing her water intake and feels she is doing better. She denies lightheadedness and states her urine is lighter colored.  ASSESSMENT AND PLAN:  Vitamin D deficiency  Dehydration  Class 2 severe obesity with serious comorbidity and body mass index (BMI) of 35.0 to 35.9 in adult, unspecified obesity type ([place])  PLAN:  Vitamin D Deficiency Cheryl [name] was informed that low Vitamin D levels contributes to fatigue and are associated with obesity, breast, and colon cancer. She agrees to continue taking Vit D and will follow-up for routine testing of Vitamin D in 2 months. She was informed of the risk of over-replacement of Vitamin D and agrees to not increase her dose unless she discusses this with Cheryl [name] first.  Cheryl [name] agrees to follow-up with our clinic in 2-3 weeks.  Dehydration Cheryl [name] will increase water intake as is and will have labs rechecked in 2 months.  I spent > than 50% of the 25 minute visit on counseling as documented in the note.  Obesity Cheryl [name] is currently in the action stage of change. As such, her goal is to continue with weight loss efforts. She has agreed to follow the Category 1 plan. Cheryl [name] has been instructed to work up to a goal of 150 minutes of combined cardio and strengthening exercise per week for weight loss and overall health benefits. We discussed the following Behavioral Modification Strategies today: work on meal planning and easy cooking plans, and keeping healthy foods in the home.  Cheryl [name] has agreed to follow-up with our clinic in 2-3 weeks. She was informed of the importance of frequent follow-up visits to maximize her success with intensive lifestyle modifications for her multiple health conditions.  ALLERGIES: Allergies  Allergen Reactions  . Ace Inhibitors Cough  . Lisinopril Cough  . Neosporin [Neomycin-Bacitracin Zn-Polymyx] Rash  . Z-Pak [Azithromycin] Other (See Comments)    thrush    MEDICATIONS: Current Outpatient Medications on File Prior to Visit  Medication Sig Dispense Refill  . aspirin 81 MG chewable tablet Chew 81 mg by mouth daily.    . Cholecalciferol (VITAMIN D3) 1000 UNITS CAPS Take 1 capsule by mouth daily.     Marland Kitchen docusate sodium (COLACE) 100 MG capsule Take 100 mg by mouth daily.    Marland Kitchen  Loratadine (CLARITIN PO) Take 1 tablet by mouth daily.     Marland Kitchen losartan (COZAAR) 100 MG tablet Take 1 tablet (100 mg total) by mouth daily. 30 tablet 0  . meloxicam (MOBIC) 15 MG tablet Take 1 tablet by mouth at bedtime.  3  . metFORMIN (GLUCOPHAGE) 500 MG tablet TAKE 1 TABLET(500 MG) BY MOUTH TWICE DAILY WITH A MEAL 60 tablet 0  . nitroGLYCERIN (NITROSTAT) 0.4 MG SL tablet Place 1 tablet (0.4 mg total) under the tongue every 5 (five) minutes as needed  for chest pain. 12 tablet 0  . UNABLE TO FIND C PAP for sleep apnea     . venlafaxine (EFFEXOR) 75 MG tablet TAKE 1 TABLET(75 MG) BY MOUTH DAILY 90 tablet 0   Current Facility-Administered Medications on File Prior to Visit  Medication Dose Route Frequency Provider Last Rate Last Dose  . 0.9 %  sodium chloride infusion  500 mL Intravenous Once Armbruster, Carlota Raspberry, MD        PAST MEDICAL HISTORY: Past Medical History:  Diagnosis Date  . Allergy   . Arthritis   . Chest pain   . Depression   . Fatigue   . GERD (gastroesophageal reflux disease)   . Hyperlipidemia   . Hypertension   . Joint pain   . Obesity   . Osteopenia   . Palpitation   . Sleep apnea    uses CPAP   . SOB (shortness of breath)   . Urinary incontinence     PAST SURGICAL HISTORY: Past Surgical History:  Procedure Laterality Date  . ABDOMINAL HYSTERECTOMY  08/10/2000   TAH/RSO  . BREAST SURGERY     Biopsy-Benign  . COLOSTOMY  2000   Dr.Patterson  . KNEE ARTHROSCOPY     left knee  . LAPAROSCOPIC CHOLECYSTECTOMY  2010  . LAPAROTOMY  1990   Fibroid removed-ovarian cyst   . RHINOPLASTY      SOCIAL HISTORY: Social History   Tobacco Use  . Smoking status: Former Smoker    Quit date: 08/15/2007    Years since quitting: 11.6  . Smokeless tobacco: Never Used  Substance Use Topics  . Alcohol use: Yes    Alcohol/week: 3.0 standard drinks    Types: 3 Standard drinks or equivalent per week    Comment: 3 drinks per week per pt   . Drug use: No    FAMILY HISTORY: Family History  Problem Relation Age of Onset  . Breast cancer Mother        Age 75  . Hypertension Mother   . Depression Mother   . Hypertension Father   . Arthritis Father   . Heart disease Father   . Cancer Father        Spleen  . Sleep apnea Father   . Heart disease Maternal Grandfather   . Colon cancer Paternal Grandfather        Colon cancer  . Esophageal cancer Neg Hx   . Stomach cancer Neg Hx    ROS: Review of Systems   Gastrointestinal: Negative for nausea and vomiting.  Musculoskeletal:       Negative for muscle weakness.  Neurological:       Negative for lightheadedness.   PHYSICAL EXAM: Pt in no acute distress  RECENT LABS AND TESTS: BMET    Component Value Date/Time   NA 147 (H) 03/18/2019 0820   K 4.9 03/18/2019 0820   CL 108 (H) 03/18/2019 0820   CO2 24 03/18/2019 0820   GLUCOSE  105 (H) 03/18/2019 0820   GLUCOSE 93 08/28/2018 1333   BUN 28 (H) 03/18/2019 0820   CREATININE 0.83 03/18/2019 0820   CREATININE 0.81 06/08/2016 1353   CALCIUM 9.9 03/18/2019 0820   GFRNONAA 76 03/18/2019 0820   GFRAA 87 03/18/2019 0820   Lab Results  Component Value Date   HGBA1C 5.3 03/18/2019   HGBA1C 5.4 09/18/2018   HGBA1C 5.5 05/28/2018   HGBA1C 5.8 (H) 02/06/2018   Lab Results  Component Value Date   INSULIN 13.0 03/18/2019   INSULIN 4.5 09/18/2018   INSULIN 6.2 05/28/2018   INSULIN 8.7 02/06/2018   CBC    Component Value Date/Time   WBC 5.9 03/18/2019 0820   WBC 5.0 06/08/2016 1353   RBC 4.56 03/18/2019 0820   RBC 4.94 06/08/2016 1353   HGB 13.6 03/18/2019 0820   HGB 14.3 04/02/2014 0937   HCT 41.6 03/18/2019 0820   PLT 263 06/08/2016 1353   MCV 91 03/18/2019 0820   MCH 29.8 03/18/2019 0820   MCH 27.5 06/08/2016 1353   MCHC 32.7 03/18/2019 0820   MCHC 31.2 (L) 06/08/2016 1353   RDW 13.8 03/18/2019 0820   LYMPHSABS 1.3 03/18/2019 0820   MONOABS 450 06/08/2016 1353   EOSABS 0.2 03/18/2019 0820   BASOSABS 0.1 03/18/2019 0820   Iron/TIBC/Ferritin/ %Sat No results found for: IRON, TIBC, FERRITIN, IRONPCTSAT Lipid Panel     Component Value Date/Time   CHOL 218 (H) 03/18/2019 0820   TRIG 114 03/18/2019 0820   HDL 65 03/18/2019 0820   CHOLHDL 3.1 09/27/2017 1404   CHOLHDL 3.4 06/08/2016 1353   VLDL 19 06/08/2016 1353   LDLCALC 130 (H) 03/18/2019 0820   Hepatic Function Panel     Component Value Date/Time   PROT 6.7 03/18/2019 0820   ALBUMIN 4.5 03/18/2019 0820   AST 13  03/18/2019 0820   ALT 14 03/18/2019 0820   ALKPHOS 81 03/18/2019 0820   BILITOT 0.3 03/18/2019 0820      Component Value Date/Time   TSH 5.910 (H) 03/18/2019 0820   TSH 3.430 05/28/2018 0950   TSH 5.100 (H) 02/06/2018 0941   Results for DALLIS, GORELIK "[name]" (MRN [id number]) as of 04/24/2019 08:59  Ref. Range 03/18/2019 08:20  Vitamin D, 25-Hydroxy Latest Ref Range: 30.0 - 100.0 ng/mL 56.7   I, Michaelene Song, am acting as Location manager for Dennard Nip, MD I have reviewed the above documentation for accuracy and completeness, and I agree with the above. -Dennard Nip, MD

## 2019-04-24 NOTE — Progress Notes (Signed)
63 y.o. G0P0 Married White or Caucasian female here for annual exam.  Doing well.  Granddaughter is doing well.  She's down over 60 pounds.  Goal is under 200.    Denies vaginal bleeding.    No LMP recorded. Patient has had a hysterectomy.          Sexually active: No.  The current method of family planning is status post hysterectomy.    Exercising: No.  Smoker:  no  Health Maintenance: Pap:  01/12/2011 Negative History of abnormal Pap:  no MMG:  09/03/2017 cyst present Colonoscopy:  2019 adenoma, follow up 3 years recommended.  Dr. Havery Moros BMD:   08/20/2017 TDaP:  2012 Pneumonia vaccine(s):  No Shingrix:   No Hep C testing: Done Screening Labs: PCP   reports that she quit smoking about 11 years ago. She has never used smokeless tobacco. She reports current alcohol use of about 3.0 standard drinks of alcohol per week. She reports that she does not use drugs.  Past Medical History:  Diagnosis Date  . Allergy   . Arthritis   . Chest pain   . Depression   . Fatigue   . GERD (gastroesophageal reflux disease)   . Hyperlipidemia   . Hypertension   . Joint pain   . Obesity   . Osteopenia   . Palpitation   . Sleep apnea    uses CPAP   . SOB (shortness of breath)   . Urinary incontinence     Past Surgical History:  Procedure Laterality Date  . ABDOMINAL HYSTERECTOMY  08/10/2000   TAH/RSO  . BREAST SURGERY     Biopsy-Benign  . COLOSTOMY  2000   Dr.Patterson  . KNEE ARTHROSCOPY     left knee  . LAPAROSCOPIC CHOLECYSTECTOMY  2010  . LAPAROTOMY  1990   Fibroid removed-ovarian cyst   . RHINOPLASTY      Current Outpatient Medications  Medication Sig Dispense Refill  . aspirin 81 MG chewable tablet Chew 81 mg by mouth daily.    . Cholecalciferol (VITAMIN D3) 1000 UNITS CAPS Take 1 capsule by mouth daily.     Marland Kitchen docusate sodium (COLACE) 100 MG capsule Take 100 mg by mouth daily.    . Loratadine (CLARITIN PO) Take 1 tablet by mouth daily.     Marland Kitchen losartan (COZAAR) 100 MG  tablet Take 1 tablet (100 mg total) by mouth daily. 30 tablet 0  . meloxicam (MOBIC) 15 MG tablet Take 1 tablet by mouth at bedtime.  3  . metFORMIN (GLUCOPHAGE) 500 MG tablet TAKE 1 TABLET(500 MG) BY MOUTH TWICE DAILY WITH A MEAL 60 tablet 0  . nitroGLYCERIN (NITROSTAT) 0.4 MG SL tablet Place 1 tablet (0.4 mg total) under the tongue every 5 (five) minutes as needed for chest pain. 12 tablet 0  . UNABLE TO FIND C PAP for sleep apnea     . venlafaxine (EFFEXOR) 75 MG tablet TAKE 1 TABLET(75 MG) BY MOUTH DAILY 90 tablet 0   Current Facility-Administered Medications  Medication Dose Route Frequency Provider Last Rate Last Dose  . 0.9 %  sodium chloride infusion  500 mL Intravenous Once Armbruster, Carlota Raspberry, MD        Family History  Problem Relation Age of Onset  . Breast cancer Mother        Age 107  . Hypertension Mother   . Depression Mother   . Hypertension Father   . Arthritis Father   . Heart disease Father   . Cancer  Father        Spleen  . Sleep apnea Father   . Heart disease Maternal Grandfather   . Colon cancer Paternal Grandfather        Colon cancer  . Esophageal cancer Neg Hx   . Stomach cancer Neg Hx     Review of Systems  Constitutional: Negative.   HENT: Negative.   Eyes: Negative.   Respiratory: Negative.   Cardiovascular: Negative.   Gastrointestinal: Negative.   Endocrine: Negative.   Genitourinary: Negative.   Musculoskeletal: Negative.   Skin: Negative.   Allergic/Immunologic: Negative.   Neurological: Negative.   Hematological: Negative.   Psychiatric/Behavioral: Negative.     Exam:   BP 110/80 (BP Location: Right Arm, Patient Position: Sitting, Cuff Size: Large)   Pulse 70   Temp (!) 97.4 F (36.3 C) (Temporal)   Ht 5' 5.5" (1.664 m)   Wt 227 lb 9.6 oz (103.2 kg)   BMI 37.30 kg/m   Height: 5' 5.5" (166.4 cm)  Ht Readings from Last 3 Encounters:  04/28/19 5' 5.5" (1.664 m)  04/09/19 5\' 6"  (1.676 m)  03/18/19 5\' 6"  (1.676 m)    General  appearance: alert, cooperative and appears stated age Head: Normocephalic, without obvious abnormality, atraumatic Neck: no adenopathy, supple, symmetrical, trachea midline and thyroid normal to inspection and palpation Lungs: clear to auscultation bilaterally Breasts: normal appearance, no masses or tenderness Heart: regular rate and rhythm Abdomen: soft, non-tender; bowel sounds normal; no masses,  no organomegaly Extremities: extremities normal, atraumatic, no cyanosis or edema Skin: Skin color, texture, turgor normal. No rashes or lesions Lymph nodes: Cervical, supraclavicular, and axillary nodes normal. No abnormal inguinal nodes palpated Neurologic: Grossly normal   Pelvic: External genitalia:  no lesions              Urethra:  normal appearing urethra with no masses, tenderness or lesions              Bartholins and Skenes: normal                 Vagina: normal appearing vagina with normal color and discharge, no lesions              Cervix: absent              Pap taken: No. Bimanual Exam:  Uterus:  uterus absent              Adnexa: no mass, fullness, tenderness               Rectovaginal: Confirms               Anus:  normal sphincter tone, no lesions  Chaperone was present for exam.  A:  Well Woman with normal exam PMP, no HRT H/o TAH/RSO 12/01 Obesity with weight loss over the past year OAB H/O depression  P:   Mammogram guideliens reivewed pap smear not indicated Colonoscopy due again in 2022 RF for Effexor 75mg  daily.  #90/4RF.  (Doesn't take BID dosing) Trial of Oxybutynin XL 5mg  daily.  #30/2RF. return annually or prn

## 2019-04-28 ENCOUNTER — Encounter: Payer: Self-pay | Admitting: Obstetrics & Gynecology

## 2019-04-28 ENCOUNTER — Other Ambulatory Visit: Payer: Self-pay

## 2019-04-28 ENCOUNTER — Ambulatory Visit: Payer: Federal, State, Local not specified - PPO | Admitting: Obstetrics & Gynecology

## 2019-04-28 VITALS — BP 110/80 | HR 70 | Temp 97.4°F | Ht 65.5 in | Wt 227.6 lb

## 2019-04-28 DIAGNOSIS — Z01419 Encounter for gynecological examination (general) (routine) without abnormal findings: Secondary | ICD-10-CM | POA: Diagnosis not present

## 2019-04-28 DIAGNOSIS — I10 Essential (primary) hypertension: Secondary | ICD-10-CM

## 2019-04-28 MED ORDER — VENLAFAXINE HCL 75 MG PO TABS: 75.0000 mg | ORAL_TABLET | Freq: Every day | ORAL | 4 refills | Status: DC

## 2019-04-28 MED ORDER — OXYBUTYNIN CHLORIDE ER 5 MG PO TB24: 5.0000 mg | ORAL_TABLET | Freq: Every day | ORAL | 2 refills | Status: DC

## 2019-04-29 ENCOUNTER — Telehealth: Payer: Self-pay | Admitting: Obstetrics & Gynecology

## 2019-04-29 NOTE — Telephone Encounter (Signed)
Made in error

## 2019-05-15 ENCOUNTER — Other Ambulatory Visit: Payer: Self-pay

## 2019-05-15 ENCOUNTER — Encounter (INDEPENDENT_AMBULATORY_CARE_PROVIDER_SITE_OTHER): Payer: Self-pay | Admitting: Family Medicine

## 2019-05-15 ENCOUNTER — Ambulatory Visit (INDEPENDENT_AMBULATORY_CARE_PROVIDER_SITE_OTHER): Payer: Federal, State, Local not specified - PPO | Admitting: Family Medicine

## 2019-05-15 VITALS — BP 127/84 | HR 62 | Temp 97.3°F | Ht 66.0 in | Wt 228.0 lb

## 2019-05-15 DIAGNOSIS — I10 Essential (primary) hypertension: Secondary | ICD-10-CM | POA: Diagnosis not present

## 2019-05-15 DIAGNOSIS — R7303 Prediabetes: Secondary | ICD-10-CM | POA: Diagnosis not present

## 2019-05-15 DIAGNOSIS — Z6836 Body mass index (BMI) 36.0-36.9, adult: Secondary | ICD-10-CM

## 2019-05-15 DIAGNOSIS — Z9189 Other specified personal risk factors, not elsewhere classified: Secondary | ICD-10-CM

## 2019-05-15 MED ORDER — METFORMIN HCL 500 MG PO TABS: ORAL_TABLET | ORAL | 0 refills | Status: DC

## 2019-05-15 MED ORDER — LOSARTAN POTASSIUM 100 MG PO TABS: 100.0000 mg | ORAL_TABLET | Freq: Every day | ORAL | 0 refills | Status: DC

## 2019-05-16 DIAGNOSIS — D225 Melanocytic nevi of trunk: Secondary | ICD-10-CM | POA: Diagnosis not present

## 2019-05-16 DIAGNOSIS — L814 Other melanin hyperpigmentation: Secondary | ICD-10-CM | POA: Diagnosis not present

## 2019-05-16 DIAGNOSIS — L723 Sebaceous cyst: Secondary | ICD-10-CM | POA: Diagnosis not present

## 2019-05-16 DIAGNOSIS — D224 Melanocytic nevi of scalp and neck: Secondary | ICD-10-CM | POA: Diagnosis not present

## 2019-05-20 NOTE — Progress Notes (Signed)
Office: [phone]  /  Fax: [phone]   HPI:   Chief Complaint: OBESITY Cheryl [name] is here to discuss her progress with her obesity treatment plan. She is on the Category 1 plan and is following her eating plan approximately 50 % of the time. She states she is exercising 0 minutes 0 times per week. Cheryl [name] has been off track, more over the last few weeks with increased boredom eating, but she states she has started to get back on track. Her weight is 228 lb (103.4 kg) today and has had a weight gain of 4 pounds since her last in-office visit. She has lost 53 lbs since starting treatment with [name].  Pre-Diabetes Cheryl [name] has a diagnosis of prediabetes based on her elevated Hgb A1c and was informed this puts her at greater risk of developing diabetes. Cheryl [name] is stable on metformin and her A1c has improved (5.3 on 03/18/19). She continues to work on diet and exercise to decrease risk of diabetes. She denies nausea, vomiting or hypoglycemia.  At risk for diabetes Cheryl [name] is at higher than average risk for developing diabetes due to her obesity and prediabetes. She currently denies polyuria or polydipsia.  Hypertension Cheryl [name] is a 63 y.o. female with hypertension. Cheryl [name]'s blood pressure is controlled on medications.  Cheryl [name] denies chest pain, headache or dizziness. She is working weight loss to help control her blood pressure with the goal of decreasing her risk of heart attack and stroke.   ASSESSMENT AND PLAN:  Prediabetes - Plan: metFORMIN (GLUCOPHAGE) 500 MG tablet  Essential hypertension - Plan: losartan (COZAAR) 100 MG tablet  At risk for diabetes mellitus  Class 2 severe obesity with serious comorbidity and body mass index (BMI) of 36.0 to 36.9 in adult, unspecified obesity type Cheryl [name])  PLAN:  Pre-Diabetes Cheryl [name] will continue to work on weight loss, exercise, and decreasing simple carbohydrates in her diet to help decrease the risk of diabetes. We dicussed  metformin including benefits and risks. She was informed that eating too many simple carbohydrates or too many calories at one sitting increases the likelihood of GI side effects. Cheryl [name] agrees to continue metformin 500 mg twice daily with a meal #60 with no refills and follow up with [name] as directed to monitor her progress.  Diabetes risk counseling Cheryl [name] was given extended (15 minutes) diabetes prevention counseling today. She is 63 y.o. female and has risk factors for diabetes including obesity and prediabetes. We discussed intensive lifestyle modifications today with an emphasis on weight loss as well as increasing exercise and decreasing simple carbohydrates in her diet.  Hypertension We discussed sodium restriction, working on healthy weight loss, and a regular exercise program as the means to achieve improved blood pressure control. Cheryl [name] agreed with this plan and agreed to follow up as directed. We will continue to monitor her blood pressure as well as her progress with the above lifestyle modifications. Cheryl [name] agrees to continue losartan 100 mg daily #30 with no refills and she will watch for signs of hypotension as she continues her lifestyle modifications.  Obesity Cheryl [name] is currently in the action stage of change. As such, her goal is to continue with weight loss efforts She has agreed to follow the Category 1 plan Cheryl [name] has been instructed to work up to a goal of 150 minutes of combined cardio and strengthening exercise per week for weight loss and overall health benefits. We discussed the following Behavioral Modification Strategies today: work on meal planning and easy cooking plans  and emotional eating strategies  Easy meal recipes were given to patient today.  Cheryl [name] has agreed to follow up with our clinic in 3 weeks. She was informed of the importance of frequent follow up visits to maximize her success with intensive lifestyle modifications for her multiple health conditions.   ALLERGIES: Allergies  Allergen Reactions  . Ace Inhibitors Cough  . Lisinopril Cough  . Neosporin [Neomycin-Bacitracin Zn-Polymyx] Rash  . Z-Pak [Azithromycin] Other (See Comments)    thrush    MEDICATIONS: Current Outpatient Medications on File Prior to Visit  Medication Sig Dispense Refill  . aspirin 81 MG chewable tablet Chew 81 mg by mouth daily.    . Cholecalciferol (VITAMIN D3) 1000 UNITS CAPS Take 1 capsule by mouth daily.     Marland Kitchen docusate sodium (COLACE) 100 MG capsule Take 100 mg by mouth daily.    . Loratadine (CLARITIN PO) Take 1 tablet by mouth daily.     . meloxicam (MOBIC) 15 MG tablet Take 1 tablet by mouth at bedtime.  3  . nitroGLYCERIN (NITROSTAT) 0.4 MG SL tablet Place 1 tablet (0.4 mg total) under the tongue every 5 (five) minutes as needed for chest pain. 12 tablet 0  . oxybutynin (DITROPAN XL) 5 MG 24 hr tablet Take 1 tablet (5 mg total) by mouth at bedtime. 30 tablet 2  . UNABLE TO FIND C PAP for sleep apnea     . venlafaxine (EFFEXOR) 75 MG tablet Take 1 tablet (75 mg total) by mouth daily. 90 tablet 4   Current Facility-Administered Medications on File Prior to Visit  Medication Dose Route Frequency Provider Last Rate Last Dose  . 0.9 %  sodium chloride infusion  500 mL Intravenous Once Armbruster, Carlota Raspberry, MD        PAST MEDICAL HISTORY: Past Medical History:  Diagnosis Date  . Allergy   . Arthritis   . Chest pain   . Depression   . Fatigue   . GERD (gastroesophageal reflux disease)   . Hyperlipidemia   . Hypertension   . Joint pain   . Obesity   . Osteopenia   . Palpitation   . Sleep apnea    uses CPAP   . SOB (shortness of breath)   . Urinary incontinence     PAST SURGICAL HISTORY: Past Surgical History:  Procedure Laterality Date  . ABDOMINAL HYSTERECTOMY  08/10/2000   TAH/RSO  . BREAST SURGERY     Biopsy-Benign  . COLOSTOMY  2000   Dr.Patterson  . KNEE ARTHROSCOPY     left knee  . LAPAROSCOPIC CHOLECYSTECTOMY  2010  .  LAPAROTOMY  1990   Fibroid removed-ovarian cyst   . RHINOPLASTY      SOCIAL HISTORY: Social History   Tobacco Use  . Smoking status: Former Smoker    Quit date: 08/15/2007    Years since quitting: 11.7  . Smokeless tobacco: Never Used  Substance Use Topics  . Alcohol use: Yes    Alcohol/week: 3.0 standard drinks    Types: 3 Standard drinks or equivalent per week    Comment: 3 drinks per week per pt   . Drug use: No    FAMILY HISTORY: Family History  Problem Relation Age of Onset  . Breast cancer Mother        Age 20  . Hypertension Mother   . Depression Mother   . Hypertension Father   . Arthritis Father   . Heart disease Father   . Cancer Father  Spleen  . Sleep apnea Father   . Heart disease Maternal Grandfather   . Colon cancer Paternal Grandfather        Colon cancer  . Esophageal cancer Neg Hx   . Stomach cancer Neg Hx     ROS: Review of Systems  Constitutional: Negative for weight loss.  Cardiovascular: Negative for chest pain.  Gastrointestinal: Negative for nausea and vomiting.  Genitourinary: Negative for frequency.  Neurological: Negative for dizziness and headaches.  Endo/Heme/Allergies: Negative for polydipsia.       Negative for hypoglycemia    PHYSICAL EXAM: Blood pressure 127/84, pulse 62, temperature (!) 97.3 F (36.3 C), temperature source Oral, height 5\' 6"  (1.676 m), weight 228 lb (103.4 kg), SpO2 98 %. Body mass index is 36.8 kg/m. Physical Exam Vitals signs reviewed.  Constitutional:      Appearance: Normal appearance. She is well-developed. She is obese.  Cardiovascular:     Rate and Rhythm: Normal rate.  Pulmonary:     Effort: Pulmonary effort is normal.  Musculoskeletal: Normal range of motion.  Skin:    General: Skin is warm and dry.  Neurological:     Mental Status: She is alert and oriented to person, place, and time.  Psychiatric:        Mood and Affect: Mood normal.        Behavior: Behavior normal.      RECENT LABS AND TESTS: BMET    Component Value Date/Time   NA 147 (H) 03/18/2019 0820   K 4.9 03/18/2019 0820   CL 108 (H) 03/18/2019 0820   CO2 24 03/18/2019 0820   GLUCOSE 105 (H) 03/18/2019 0820   GLUCOSE 93 08/28/2018 1333   BUN 28 (H) 03/18/2019 0820   CREATININE 0.83 03/18/2019 0820   CREATININE 0.81 06/08/2016 1353   CALCIUM 9.9 03/18/2019 0820   GFRNONAA 76 03/18/2019 0820   GFRAA 87 03/18/2019 0820   Lab Results  Component Value Date   HGBA1C 5.3 03/18/2019   HGBA1C 5.4 09/18/2018   HGBA1C 5.5 05/28/2018   HGBA1C 5.8 (H) 02/06/2018   Lab Results  Component Value Date   INSULIN 13.0 03/18/2019   INSULIN 4.5 09/18/2018   INSULIN 6.2 05/28/2018   INSULIN 8.7 02/06/2018   CBC    Component Value Date/Time   WBC 5.9 03/18/2019 0820   WBC 5.0 06/08/2016 1353   RBC 4.56 03/18/2019 0820   RBC 4.94 06/08/2016 1353   HGB 13.6 03/18/2019 0820   HGB 14.3 04/02/2014 0937   HCT 41.6 03/18/2019 0820   PLT 263 06/08/2016 1353   MCV 91 03/18/2019 0820   MCH 29.8 03/18/2019 0820   MCH 27.5 06/08/2016 1353   MCHC 32.7 03/18/2019 0820   MCHC 31.2 (L) 06/08/2016 1353   RDW 13.8 03/18/2019 0820   LYMPHSABS 1.3 03/18/2019 0820   MONOABS 450 06/08/2016 1353   EOSABS 0.2 03/18/2019 0820   BASOSABS 0.1 03/18/2019 0820   Iron/TIBC/Ferritin/ %Sat No results found for: IRON, TIBC, FERRITIN, IRONPCTSAT Lipid Panel     Component Value Date/Time   CHOL 218 (H) 03/18/2019 0820   TRIG 114 03/18/2019 0820   HDL 65 03/18/2019 0820   CHOLHDL 3.1 09/27/2017 1404   CHOLHDL 3.4 06/08/2016 1353   VLDL 19 06/08/2016 1353   LDLCALC 130 (H) 03/18/2019 0820   Hepatic Function Panel     Component Value Date/Time   PROT 6.7 03/18/2019 0820   ALBUMIN 4.5 03/18/2019 0820   AST 13 03/18/2019 0820   ALT  14 03/18/2019 0820   ALKPHOS 81 03/18/2019 0820   BILITOT 0.3 03/18/2019 0820      Component Value Date/Time   TSH 5.910 (H) 03/18/2019 0820   TSH 3.430 05/28/2018 0950   TSH  5.100 (H) 02/06/2018 0941     Ref. Range 03/18/2019 08:20  Vitamin D, 25-Hydroxy Latest Ref Range: 30.0 - 100.0 ng/mL 56.7    OBESITY BEHAVIORAL INTERVENTION VISIT  Today's visit was # 26  Starting weight: 281 lbs Starting date: 02/06/2018 Today's weight : 228 lbs Today's date: 05/15/2019 Total lbs lost to date: 53    05/15/2019  Height 5\' 6"  (1.676 m)  Weight 228 lb (103.4 kg)  BMI (Calculated) 36.82  BLOOD PRESSURE - SYSTOLIC [id number]  BLOOD PRESSURE - DIASTOLIC 84   Body Fat % [id number] %  Total Body Water (lbs) 84.6 lbs    ASK: We discussed the diagnosis of obesity with Tally Due today and Annanicole agreed to give [name] permission to discuss obesity behavioral modification therapy today.  ASSESS: Tamzyn has the diagnosis of obesity and her BMI today is 36.82 Sherna is in the action stage of change   ADVISE: Iiesha was educated on the multiple health risks of obesity as well as the benefit of weight loss to improve her health. She was advised of the need for long term treatment and the importance of lifestyle modifications to improve her current health and to decrease her risk of future health problems.  AGREE: Multiple dietary modification options and treatment options were discussed and  Shayli agreed to follow the recommendations documented in the above note.  ARRANGE: Kliyah was educated on the importance of frequent visits to treat obesity as outlined per CMS and USPSTF guidelines and agreed to schedule her next follow up appointment today.  I, Doreene Nest, am acting as transcriptionist for Dennard Nip, MD  I have reviewed the above documentation for accuracy and completeness, and I agree with the above. -Dennard Nip, MD

## 2019-06-05 ENCOUNTER — Encounter (INDEPENDENT_AMBULATORY_CARE_PROVIDER_SITE_OTHER): Payer: Self-pay | Admitting: Family Medicine

## 2019-06-05 ENCOUNTER — Other Ambulatory Visit: Payer: Self-pay

## 2019-06-05 ENCOUNTER — Ambulatory Visit (INDEPENDENT_AMBULATORY_CARE_PROVIDER_SITE_OTHER): Payer: Federal, State, Local not specified - PPO | Admitting: Family Medicine

## 2019-06-05 VITALS — BP 110/77 | HR 76 | Temp 97.7°F | Ht 66.0 in | Wt 222.0 lb

## 2019-06-05 DIAGNOSIS — Z6836 Body mass index (BMI) 36.0-36.9, adult: Secondary | ICD-10-CM

## 2019-06-05 DIAGNOSIS — Z9189 Other specified personal risk factors, not elsewhere classified: Secondary | ICD-10-CM

## 2019-06-05 DIAGNOSIS — E559 Vitamin D deficiency, unspecified: Secondary | ICD-10-CM | POA: Diagnosis not present

## 2019-06-05 DIAGNOSIS — I10 Essential (primary) hypertension: Secondary | ICD-10-CM

## 2019-06-05 MED ORDER — LOSARTAN POTASSIUM 100 MG PO TABS: 100.0000 mg | ORAL_TABLET | Freq: Every day | ORAL | 0 refills | Status: DC

## 2019-06-08 NOTE — Progress Notes (Signed)
Office: [phone]  /  Fax: [phone]   HPI:   Chief Complaint: OBESITY Cheryl [name] is here to discuss her progress with her obesity treatment plan. She is on the Category 1 plan and is following her eating plan approximately 75 % of the time. She states she is exercising 0 minutes 0 times per week. Cheryl [name] continues to do well with weight loss. She is journaling well and her hunger is controlled. She is ready to start exercising.  Her weight is 222 lb (100.7 kg) today and has had a weight loss of 6 pounds over a period of 3 weeks since her last visit. She has lost 59 lbs since starting treatment with [name].  Hypertension Cheryl [name] is a 63 y.o. female with hypertension. Cheryl [name]'s blood pressure is stable on medications and diet. She denies chest pain, headaches, or dizziness. She is working on weight loss to help control her blood pressure with the goal of decreasing her risk of heart attack and stroke.  At risk for cardiovascular disease Cheryl [name] is at a higher than average risk for cardiovascular disease [name] to obesity and hypertension. She currently denies any chest pain.  Vitamin D Deficiency Cheryl [name] has a diagnosis of vitamin D deficiency. She is stable on Vit D3, and last level was at goal. She denies nausea, vomiting or muscle weakness.  ASSESSMENT AND PLAN:  Essential hypertension - Plan: losartan (COZAAR) 100 MG tablet  Vitamin D deficiency  At risk for heart disease  Class 2 severe obesity with serious comorbidity and body mass index (BMI) of 36.0 to 36.9 in adult, unspecified obesity type (Cheryl [name])  PLAN:  Hypertension We discussed sodium restriction, working on healthy weight loss, and a regular exercise program as the means to achieve improved blood pressure control. Cheryl [name] agreed with this plan and agreed to follow up as directed. We will continue to monitor her blood pressure as well as her progress with the above lifestyle modifications. Cheryl [name] agrees to continue taking  losartan 100 mg PO q daily #30 and we will refill for 1 month. She will watch for signs of hypotension as she continues her lifestyle modifications. Cheryl [name] agrees to follow up with our clinic in 2 weeks.  Cardiovascular risk counseling Cheryl [name] was given extended (15 minutes) coronary artery disease prevention counseling today. She is 63 y.o. female and has risk factors for heart disease including obesity and hypertension. We discussed intensive lifestyle modifications today with an emphasis on specific weight loss instructions and strategies. Pt was also informed of the importance of increasing exercise and decreasing saturated fats to help prevent heart disease.  Vitamin D Deficiency Cheryl [name] was informed that low vitamin D levels contributes to fatigue and are associated with obesity, breast, and colon cancer. Cheryl [name] agrees to continue taking Vit D3 1,000 IU daily and will follow up for routine testing of vitamin D, at least 2-3 times per year. She was informed of the risk of over-replacement of vitamin D and agrees to not increase her dose unless she discusses this with [name] first. We will recheck labs in 1 month. Cheryl [name] agrees to follow up with our clinic in 2 weeks.  Obesity Cheryl [name] is currently in the action stage of change. As such, her goal is to continue with weight loss efforts She has agreed to keep a food journal with 1200 calories and 70 grams of protein daily Cheryl [name] has been instructed to work up to a goal of 150 minutes of combined cardio and strengthening exercise per week for weight  loss and overall health benefits. We discussed the following Behavioral Modification Strategies today: increasing lean protein intake, decreasing simple carbohydrates  and emotional eating strategies   Cheryl [name] has agreed to follow up with our clinic in 2 weeks. She was informed of the importance of frequent follow up visits to maximize her success with intensive lifestyle modifications for her multiple health  conditions.  ALLERGIES: Allergies  Allergen Reactions  . Ace Inhibitors Cough  . Lisinopril Cough  . Neosporin [Neomycin-Bacitracin Zn-Polymyx] Rash  . Z-Pak [Azithromycin] Other (See Comments)    thrush    MEDICATIONS: Current Outpatient Medications on File Prior to Visit  Medication Sig Dispense Refill  . aspirin 81 MG chewable tablet Chew 81 mg by mouth daily.    . Cholecalciferol (VITAMIN D3) 1000 UNITS CAPS Take 1 capsule by mouth daily.     Marland Kitchen docusate sodium (COLACE) 100 MG capsule Take 100 mg by mouth daily.    . Loratadine (CLARITIN PO) Take 1 tablet by mouth daily.     . meloxicam (MOBIC) 15 MG tablet Take 1 tablet by mouth at bedtime.  3  . metFORMIN (GLUCOPHAGE) 500 MG tablet TAKE 1 TABLET(500 MG) BY MOUTH TWICE DAILY WITH A MEAL 60 tablet 0  . nitroGLYCERIN (NITROSTAT) 0.4 MG SL tablet Place 1 tablet (0.4 mg total) under the tongue every 5 (five) minutes as needed for chest pain. 12 tablet 0  . oxybutynin (DITROPAN XL) 5 MG 24 hr tablet Take 1 tablet (5 mg total) by mouth at bedtime. 30 tablet 2  . UNABLE TO FIND C PAP for sleep apnea     . venlafaxine (EFFEXOR) 75 MG tablet Take 1 tablet (75 mg total) by mouth daily. 90 tablet 4   Current Facility-Administered Medications on File Prior to Visit  Medication Dose Route Frequency Provider Last Rate Last Dose  . 0.9 %  sodium chloride infusion  500 mL Intravenous Once Armbruster, Carlota Raspberry, MD        PAST MEDICAL HISTORY: Past Medical History:  Diagnosis Date  . Allergy   . Arthritis   . Chest pain   . Depression   . Fatigue   . GERD (gastroesophageal reflux disease)   . Hyperlipidemia   . Hypertension   . Joint pain   . Obesity   . Osteopenia   . Palpitation   . Sleep apnea    uses CPAP   . SOB (shortness of breath)   . Urinary incontinence     PAST SURGICAL HISTORY: Past Surgical History:  Procedure Laterality Date  . ABDOMINAL HYSTERECTOMY  08/10/2000   TAH/RSO  . BREAST SURGERY     Biopsy-Benign   . COLOSTOMY  2000   Dr.Patterson  . KNEE ARTHROSCOPY     left knee  . LAPAROSCOPIC CHOLECYSTECTOMY  2010  . LAPAROTOMY  1990   Fibroid removed-ovarian cyst   . RHINOPLASTY      SOCIAL HISTORY: Social History   Tobacco Use  . Smoking status: Former Smoker    Quit date: 08/15/2007    Years since quitting: 11.8  . Smokeless tobacco: Never Used  Substance Use Topics  . Alcohol use: Yes    Alcohol/week: 3.0 standard drinks    Types: 3 Standard drinks or equivalent per week    Comment: 3 drinks per week per pt   . Drug use: No    FAMILY HISTORY: Family History  Problem Relation Age of Onset  . Breast cancer Mother  Age 23  . Hypertension Mother   . Depression Mother   . Hypertension Father   . Arthritis Father   . Heart disease Father   . Cancer Father        Spleen  . Sleep apnea Father   . Heart disease Maternal Grandfather   . Colon cancer Paternal Grandfather        Colon cancer  . Esophageal cancer Neg Hx   . Stomach cancer Neg Hx     ROS: Review of Systems  Constitutional: Positive for weight loss.  Cardiovascular: Negative for chest pain.  Gastrointestinal: Negative for nausea and vomiting.  Musculoskeletal:       Negative muscle weakness  Neurological: Negative for dizziness and headaches.    PHYSICAL EXAM: Blood pressure 110/77, pulse 76, temperature 97.7 F (36.5 C), temperature source Oral, height 5\' 6"  (1.676 m), weight 222 lb (100.7 kg), SpO2 96 %. Body mass index is 35.83 kg/m. Physical Exam Vitals signs reviewed.  Constitutional:      Appearance: Normal appearance. She is obese.  Cardiovascular:     Rate and Rhythm: Normal rate.     Pulses: Normal pulses.  Pulmonary:     Effort: Pulmonary effort is normal.     Breath sounds: Normal breath sounds.  Musculoskeletal: Normal range of motion.  Skin:    General: Skin is warm and dry.  Neurological:     Mental Status: She is alert and oriented to person, place, and time.  Psychiatric:         Mood and Affect: Mood normal.        Behavior: Behavior normal.     RECENT LABS AND TESTS: BMET    Component Value Date/Time   NA 147 (H) 03/18/2019 0820   K 4.9 03/18/2019 0820   CL 108 (H) 03/18/2019 0820   CO2 24 03/18/2019 0820   GLUCOSE 105 (H) 03/18/2019 0820   GLUCOSE 93 08/28/2018 1333   BUN 28 (H) 03/18/2019 0820   CREATININE 0.83 03/18/2019 0820   CREATININE 0.81 06/08/2016 1353   CALCIUM 9.9 03/18/2019 0820   GFRNONAA 76 03/18/2019 0820   GFRAA 87 03/18/2019 0820   Lab Results  Component Value Date   HGBA1C 5.3 03/18/2019   HGBA1C 5.4 09/18/2018   HGBA1C 5.5 05/28/2018   HGBA1C 5.8 (H) 02/06/2018   Lab Results  Component Value Date   INSULIN 13.0 03/18/2019   INSULIN 4.5 09/18/2018   INSULIN 6.2 05/28/2018   INSULIN 8.7 02/06/2018   CBC    Component Value Date/Time   WBC 5.9 03/18/2019 0820   WBC 5.0 06/08/2016 1353   RBC 4.56 03/18/2019 0820   RBC 4.94 06/08/2016 1353   HGB 13.6 03/18/2019 0820   HGB 14.3 04/02/2014 0937   HCT 41.6 03/18/2019 0820   PLT 263 06/08/2016 1353   MCV 91 03/18/2019 0820   MCH 29.8 03/18/2019 0820   MCH 27.5 06/08/2016 1353   MCHC 32.7 03/18/2019 0820   MCHC 31.2 (L) 06/08/2016 1353   RDW 13.8 03/18/2019 0820   LYMPHSABS 1.3 03/18/2019 0820   MONOABS 450 06/08/2016 1353   EOSABS 0.2 03/18/2019 0820   BASOSABS 0.1 03/18/2019 0820   Iron/TIBC/Ferritin/ %Sat No results found for: IRON, TIBC, FERRITIN, IRONPCTSAT Lipid Panel     Component Value Date/Time   CHOL 218 (H) 03/18/2019 0820   TRIG 114 03/18/2019 0820   HDL 65 03/18/2019 0820   CHOLHDL 3.1 09/27/2017 1404   CHOLHDL 3.4 06/08/2016 1353   VLDL  19 06/08/2016 1353   LDLCALC 130 (H) 03/18/2019 0820   Hepatic Function Panel     Component Value Date/Time   PROT 6.7 03/18/2019 0820   ALBUMIN 4.5 03/18/2019 0820   AST 13 03/18/2019 0820   ALT 14 03/18/2019 0820   ALKPHOS 81 03/18/2019 0820   BILITOT 0.3 03/18/2019 0820      Component Value  Date/Time   TSH 5.910 (H) 03/18/2019 0820   TSH 3.430 05/28/2018 0950   TSH 5.100 (H) 02/06/2018 0941      OBESITY BEHAVIORAL INTERVENTION VISIT  Today's visit was # 27   Starting weight: 281 lbs Starting date: 02/06/18 Today's weight : 222 lbs  Today's date: 06/05/2019 Total lbs lost to date: 8    ASK: We discussed the diagnosis of obesity with Cheryl [name] today and Cheryl [name] agreed to give [name] permission to discuss obesity behavioral modification therapy today.  ASSESS: Cheryl [name] has the diagnosis of obesity and her BMI today is 35.85 Cheryl [name] is in the action stage of change   ADVISE: Cheryl [name] was educated on the multiple health risks of obesity as well as the benefit of weight loss to improve her health. She was advised of the need for long term treatment and the importance of lifestyle modifications to improve her current health and to decrease her risk of future health problems.  AGREE: Multiple dietary modification options and treatment options were discussed and  Akara agreed to follow the recommendations documented in the above note.  ARRANGE: Ahliya was educated on the importance of frequent visits to treat obesity as outlined per CMS and USPSTF guidelines and agreed to schedule her next follow up appointment today.  I, Cheryl [name], am acting as transcriptionist for Dennard Nip, MD  I have reviewed the above documentation for accuracy and completeness, and I agree with the above. -Dennard Nip, MD

## 2019-06-23 ENCOUNTER — Encounter (INDEPENDENT_AMBULATORY_CARE_PROVIDER_SITE_OTHER): Payer: Self-pay | Admitting: Family Medicine

## 2019-06-23 ENCOUNTER — Other Ambulatory Visit: Payer: Self-pay

## 2019-06-23 ENCOUNTER — Ambulatory Visit (INDEPENDENT_AMBULATORY_CARE_PROVIDER_SITE_OTHER): Payer: Federal, State, Local not specified - PPO | Admitting: Family Medicine

## 2019-06-23 VITALS — BP 124/85 | HR 66 | Temp 97.8°F | Ht 66.0 in | Wt 224.0 lb

## 2019-06-23 DIAGNOSIS — Z9189 Other specified personal risk factors, not elsewhere classified: Secondary | ICD-10-CM

## 2019-06-23 DIAGNOSIS — R7303 Prediabetes: Secondary | ICD-10-CM

## 2019-06-23 DIAGNOSIS — I10 Essential (primary) hypertension: Secondary | ICD-10-CM | POA: Diagnosis not present

## 2019-06-23 DIAGNOSIS — Z6836 Body mass index (BMI) 36.0-36.9, adult: Secondary | ICD-10-CM

## 2019-06-23 MED ORDER — METFORMIN HCL 500 MG PO TABS: ORAL_TABLET | ORAL | 0 refills | Status: DC

## 2019-06-23 MED ORDER — LOSARTAN POTASSIUM 100 MG PO TABS: 100.0000 mg | ORAL_TABLET | Freq: Every day | ORAL | 0 refills | Status: DC

## 2019-06-23 NOTE — Progress Notes (Signed)
Office: [phone]  /  Fax: [phone]   HPI:   Chief Complaint: OBESITY Cheryl [name] is here to discuss her progress with her obesity treatment plan. She is on the keep a food journal with 1200 calories and 70 grams of protein daily and is following her eating plan approximately 75 % of the time. She states she is working for 20 minutes 2-3 times per week. Adysson hasn't been journaling as frequently, and had some increased celebration eating but is getting back on track.  She has some concerns about holiday weight gain. Her weight is 224 lb (101.6 kg) today and has gained 2 lbs since her last visit. She has lost 57 lbs since starting treatment with [name].  Hypertension Cheryl [name] is a 63 y.o. female with hypertension. [name]'s blood pressure is well controlled on medications, and diet. She denies chest pain. She is working on weight loss to help control her blood pressure with the goal of decreasing her risk of heart attack and stroke.  At risk for cardiovascular disease Cheryl [name] is at a higher than average risk for cardiovascular disease due to obesity and hypertension. She currently denies any chest pain.  Pre-Diabetes Cheryl [name] has a diagnosis of pre-diabetes based on her elevated Hgb A1c and was informed this puts her at greater risk of developing diabetes. She is stable on metformin and denies GI upset. She is working on diet and exercise to decrease risk of diabetes. She denies hypoglycemia.  ASSESSMENT AND PLAN:  Essential hypertension - Plan: losartan (COZAAR) 100 MG tablet  Prediabetes - Plan: metFORMIN (GLUCOPHAGE) 500 MG tablet  At risk for heart disease  Class 2 severe obesity with serious comorbidity and body mass index (BMI) of 36.0 to 36.9 in adult, unspecified obesity type ([place])  PLAN:  Hypertension We discussed sodium restriction, working on healthy weight loss, and a regular exercise program as the means to achieve improved blood pressure control. [name] agreed with  this plan and agreed to follow up as directed. We will continue to monitor her blood pressure as well as her progress with the above lifestyle modifications. [name] agrees to continue taking losartan 100 mg PO q daily #30 and we will refill for 1 month. She will watch for signs of hypotension as she continues her lifestyle modifications. She is to get labs done at her next visit. [name] agrees to follow up with our clinic in 3 weeks.  Cardiovascular risk counseling [name] was given extended (15 minutes) coronary artery disease prevention counseling today. She is 63 y.o. female and has risk factors for heart disease including obesity and hypertension. We discussed intensive lifestyle modifications today with an emphasis on specific weight loss instructions and strategies. Pt was also informed of the importance of increasing exercise and decreasing saturated fats to help prevent heart disease.  Pre-Diabetes Cheryl [name] will continue to work on weight loss, exercise, and decreasing simple carbohydrates in her diet to help decrease the risk of diabetes. We dicussed metformin including benefits and risks. She was informed that eating too many simple carbohydrates or too many calories at one sitting increases the likelihood of GI side effects. Cheryl [name] agrees to continue taking metformin 500 mg PO BID #60 and we will refill for 1 month. [name] agrees to follow up with our clinic in 3 weeks as directed to monitor her progress.  Obesity Cheryl [name] is currently in the action stage of change. As such, her goal is to continue with weight loss efforts She has agreed to keep a food  journal with 1200 calories and 70 grams of protein daily Cheryl [name] has been instructed to work up to a goal of 150 minutes of combined cardio and strengthening exercise per week for weight loss and overall health benefits. We discussed the following Behavioral Modification Strategies today: holiday eating strategies    Cheryl [name] has agreed to follow up  with our clinic in 3 weeks. She was informed of the importance of frequent follow up visits to maximize her success with intensive lifestyle modifications for her multiple health conditions.  ALLERGIES: Allergies  Allergen Reactions  . Ace Inhibitors Cough  . Lisinopril Cough  . Neosporin [Neomycin-Bacitracin Zn-Polymyx] Rash  . Z-Pak [Azithromycin] Other (See Comments)    thrush    MEDICATIONS: Current Outpatient Medications on File Prior to Visit  Medication Sig Dispense Refill  . aspirin 81 MG chewable tablet Chew 81 mg by mouth daily.    . Cholecalciferol (VITAMIN D3) 1000 UNITS CAPS Take 1 capsule by mouth daily.     Cheryl [name] docusate sodium (COLACE) 100 MG capsule Take 100 mg by mouth daily.    . Loratadine (CLARITIN PO) Take 1 tablet by mouth daily.     . meloxicam (MOBIC) 15 MG tablet Take 1 tablet by mouth at bedtime.  3  . nitroGLYCERIN (NITROSTAT) 0.4 MG SL tablet Place 1 tablet (0.4 mg total) under the tongue every 5 (five) minutes as needed for chest pain. 12 tablet 0  . oxybutynin (DITROPAN XL) 5 MG 24 hr tablet Take 1 tablet (5 mg total) by mouth at bedtime. 30 tablet 2  . UNABLE TO FIND C PAP for sleep apnea     . venlafaxine (EFFEXOR) 75 MG tablet Take 1 tablet (75 mg total) by mouth daily. 90 tablet 4   Current Facility-Administered Medications on File Prior to Visit  Medication Dose Route Frequency Provider Last Rate Last Dose  . 0.9 %  sodium chloride infusion  500 mL Intravenous Once Armbruster, Carlota Raspberry, MD        PAST MEDICAL HISTORY: Past Medical History:  Diagnosis Date  . Allergy   . Arthritis   . Chest pain   . Depression   . Fatigue   . GERD (gastroesophageal reflux disease)   . Hyperlipidemia   . Hypertension   . Joint pain   . Obesity   . Osteopenia   . Palpitation   . Sleep apnea    uses CPAP   . SOB (shortness of breath)   . Urinary incontinence     PAST SURGICAL HISTORY: Past Surgical History:  Procedure Laterality Date  . ABDOMINAL  HYSTERECTOMY  08/10/2000   TAH/RSO  . BREAST SURGERY     Biopsy-Benign  . COLOSTOMY  2000   Dr.Patterson  . KNEE ARTHROSCOPY     left knee  . LAPAROSCOPIC CHOLECYSTECTOMY  2010  . LAPAROTOMY  1990   Fibroid removed-ovarian cyst   . RHINOPLASTY      SOCIAL HISTORY: Social History   Tobacco Use  . Smoking status: Former Smoker    Quit date: 08/15/2007    Years since quitting: 11.8  . Smokeless tobacco: Never Used  Substance Use Topics  . Alcohol use: Yes    Alcohol/week: 3.0 standard drinks    Types: 3 Standard drinks or equivalent per week    Comment: 3 drinks per week per pt   . Drug use: No    FAMILY HISTORY: Family History  Problem Relation Age of Onset  . Breast cancer Mother  Age 91  . Hypertension Mother   . Depression Mother   . Hypertension Father   . Arthritis Father   . Heart disease Father   . Cancer Father        Spleen  . Sleep apnea Father   . Heart disease Maternal Grandfather   . Colon cancer Paternal Grandfather        Colon cancer  . Esophageal cancer Neg Hx   . Stomach cancer Neg Hx     ROS: Review of Systems  Constitutional: Negative for weight loss.  Cardiovascular: Negative for chest pain.  Endo/Heme/Allergies:       Negative hypoglycemia    PHYSICAL EXAM: Blood pressure 124/85, pulse 66, temperature 97.8 F (36.6 C), temperature source Oral, height 5\' 6"  (1.676 m), weight 224 lb (101.6 kg), SpO2 98 %. Body mass index is 36.15 kg/m. Physical Exam Vitals signs reviewed.  Constitutional:      Appearance: Normal appearance. She is obese.  Cardiovascular:     Rate and Rhythm: Normal rate.     Pulses: Normal pulses.  Pulmonary:     Effort: Pulmonary effort is normal.     Breath sounds: Normal breath sounds.  Musculoskeletal: Normal range of motion.  Skin:    General: Skin is warm and dry.  Neurological:     Mental Status: She is alert and oriented to person, place, and time.  Psychiatric:        Mood and Affect: Mood  normal.        Behavior: Behavior normal.     RECENT LABS AND TESTS: BMET    Component Value Date/Time   NA 147 (H) 03/18/2019 0820   K 4.9 03/18/2019 0820   CL 108 (H) 03/18/2019 0820   CO2 24 03/18/2019 0820   GLUCOSE 105 (H) 03/18/2019 0820   GLUCOSE 93 08/28/2018 1333   BUN 28 (H) 03/18/2019 0820   CREATININE 0.83 03/18/2019 0820   CREATININE 0.81 06/08/2016 1353   CALCIUM 9.9 03/18/2019 0820   GFRNONAA 76 03/18/2019 0820   GFRAA 87 03/18/2019 0820   Lab Results  Component Value Date   HGBA1C 5.3 03/18/2019   HGBA1C 5.4 09/18/2018   HGBA1C 5.5 05/28/2018   HGBA1C 5.8 (H) 02/06/2018   Lab Results  Component Value Date   INSULIN 13.0 03/18/2019   INSULIN 4.5 09/18/2018   INSULIN 6.2 05/28/2018   INSULIN 8.7 02/06/2018   CBC    Component Value Date/Time   WBC 5.9 03/18/2019 0820   WBC 5.0 06/08/2016 1353   RBC 4.56 03/18/2019 0820   RBC 4.94 06/08/2016 1353   HGB 13.6 03/18/2019 0820   HGB 14.3 04/02/2014 0937   HCT 41.6 03/18/2019 0820   PLT 263 06/08/2016 1353   MCV 91 03/18/2019 0820   MCH 29.8 03/18/2019 0820   MCH 27.5 06/08/2016 1353   MCHC 32.7 03/18/2019 0820   MCHC 31.2 (L) 06/08/2016 1353   RDW 13.8 03/18/2019 0820   LYMPHSABS 1.3 03/18/2019 0820   MONOABS 450 06/08/2016 1353   EOSABS 0.2 03/18/2019 0820   BASOSABS 0.1 03/18/2019 0820   Iron/TIBC/Ferritin/ %Sat No results found for: IRON, TIBC, FERRITIN, IRONPCTSAT Lipid Panel     Component Value Date/Time   CHOL 218 (H) 03/18/2019 0820   TRIG 114 03/18/2019 0820   HDL 65 03/18/2019 0820   CHOLHDL 3.1 09/27/2017 1404   CHOLHDL 3.4 06/08/2016 1353   VLDL 19 06/08/2016 1353   LDLCALC 130 (H) 03/18/2019 0820   Hepatic Function Panel  Component Value Date/Time   PROT 6.7 03/18/2019 0820   ALBUMIN 4.5 03/18/2019 0820   AST 13 03/18/2019 0820   ALT 14 03/18/2019 0820   ALKPHOS 81 03/18/2019 0820   BILITOT 0.3 03/18/2019 0820      Component Value Date/Time   TSH 5.910 (H)  03/18/2019 0820   TSH 3.430 05/28/2018 0950   TSH 5.100 (H) 02/06/2018 0941      OBESITY BEHAVIORAL INTERVENTION VISIT  Today's visit was # 28   Starting weight: 281 lbs Starting date: 02/06/18 Today's weight : 244 lbs Today's date: 06/23/2019 Total lbs lost to date: 55    ASK: We discussed the diagnosis of obesity with Tally Due today and Cookie agreed to give [name] permission to discuss obesity behavioral modification therapy today.  ASSESS: Beretta has the diagnosis of obesity and her BMI today is 36.17 Dania is in the action stage of change   ADVISE: Jaliza was educated on the multiple health risks of obesity as well as the benefit of weight loss to improve her health. She was advised of the need for long term treatment and the importance of lifestyle modifications to improve her current health and to decrease her risk of future health problems.  AGREE: Multiple dietary modification options and treatment options were discussed and  Wava agreed to follow the recommendations documented in the above note.  ARRANGE: Rolinda was educated on the importance of frequent visits to treat obesity as outlined per CMS and USPSTF guidelines and agreed to schedule her next follow up appointment today.  I, Trixie Dredge, am acting as transcriptionist for Dennard Nip, MD I have reviewed the above documentation for accuracy and completeness, and I agree with the above. -Dennard Nip, MD

## 2019-07-15 ENCOUNTER — Encounter (INDEPENDENT_AMBULATORY_CARE_PROVIDER_SITE_OTHER): Payer: Self-pay | Admitting: Family Medicine

## 2019-07-15 ENCOUNTER — Other Ambulatory Visit: Payer: Self-pay

## 2019-07-15 ENCOUNTER — Ambulatory Visit (INDEPENDENT_AMBULATORY_CARE_PROVIDER_SITE_OTHER): Payer: Federal, State, Local not specified - PPO | Admitting: Family Medicine

## 2019-07-15 VITALS — BP 146/83 | HR 72 | Temp 97.6°F | Ht 66.0 in | Wt 222.0 lb

## 2019-07-15 DIAGNOSIS — I10 Essential (primary) hypertension: Secondary | ICD-10-CM | POA: Diagnosis not present

## 2019-07-15 DIAGNOSIS — R7303 Prediabetes: Secondary | ICD-10-CM | POA: Diagnosis not present

## 2019-07-15 DIAGNOSIS — Z9189 Other specified personal risk factors, not elsewhere classified: Secondary | ICD-10-CM | POA: Diagnosis not present

## 2019-07-15 DIAGNOSIS — R7989 Other specified abnormal findings of blood chemistry: Secondary | ICD-10-CM | POA: Diagnosis not present

## 2019-07-15 DIAGNOSIS — E7849 Other hyperlipidemia: Secondary | ICD-10-CM

## 2019-07-15 DIAGNOSIS — E559 Vitamin D deficiency, unspecified: Secondary | ICD-10-CM | POA: Diagnosis not present

## 2019-07-15 DIAGNOSIS — Z6835 Body mass index (BMI) 35.0-35.9, adult: Secondary | ICD-10-CM

## 2019-07-15 MED ORDER — LOSARTAN POTASSIUM 100 MG PO TABS: 100.0000 mg | ORAL_TABLET | Freq: Every day | ORAL | 0 refills | Status: DC

## 2019-07-15 NOTE — Progress Notes (Signed)
Office: [phone]  /  Fax: [phone]   HPI:   Chief Complaint: OBESITY Cheryl [name] is here to discuss her progress with her obesity treatment plan. She is on the keep a food journal with 1200 calories and 70 grams of protein daily plan and she is following her eating plan approximately 70 % of the time. She states she is walking 30 minutes 3 times per week. Cheryl [name] has done very well moderating her holiday eating and she has even lost two pounds over Thanksgiving. Hunger is controlled and she is not feeling deprived. Her weight is 222 lb (100.7 kg) today and has had a weight loss of 2 pounds over a period of 3 weeks since her last visit. She has lost 59 lbs since starting treatment with [name].  Hyperlipidemia Cheryl [name] has hyperlipidemia and has been trying to improve her cholesterol levels with intensive lifestyle modification including a low saturated fat diet, exercise and weight loss. She denies any chest pain or myalgias.  Elevated TSH Cheryl [name]'s TSH was mildly elevated at the last visit, but her T3 and T4 are within normal limits. She denies palpitations or worsening fatigue.  Vitamin D deficiency Cheryl [name] has a diagnosis of vitamin D deficiency. Cheryl [name] is currently on OTC vit D and she is due to have her labs rechecked. Cheryl [name] denies nausea, vomiting or muscle weakness.  Pre-Diabetes Cheryl [name] has a diagnosis of prediabetes based on her elevated Hgb A1c and was informed this puts her at greater risk of developing diabetes. Cheryl [name] is taking metformin currently and she continues to work on diet and exercise to decrease risk of diabetes. She has decreased polyphagia. Cheryl [name] is due to have her labs rechecked.  Hypertension Cheryl [name] is a 63 y.o. female with hypertension. Her blood pressure is elevated today. She didn't take her medications, due to fasting, and she has also had increased back pain. Cheryl [name] denies chest pain. She is working weight loss to help control her blood  pressure with the goal of decreasing her risk of heart attack and stroke. Cheryl [name] blood pressure is currently controlled.  At risk for cardiovascular disease Cheryl [name] is at a higher than average risk for cardiovascular disease due to obesity, hyperlipidemia, prediabetes and hypertension. She currently denies any chest pain.  ASSESSMENT AND PLAN:  Essential hypertension - Plan: losartan (COZAAR) 100 MG tablet  Other hyperlipidemia - Plan: Lipid Panel With LDL/HDL Ratio  TSH elevation - Plan: T3, T4, free, TSH  Vitamin D deficiency - Plan: Vitamin D (25 hydroxy)  Prediabetes - Plan: HgB A1c, Insulin, random, Comprehensive Metabolic Panel (CMET)  At risk for heart disease  Class 2 severe obesity with serious comorbidity and body mass index (BMI) of 35.0 to 35.9 in adult, unspecified obesity type (Cheryl [name])  PLAN:  Hyperlipidemia Cheryl [name] was informed of the American Heart Association Guidelines emphasizing intensive lifestyle modifications as the first line treatment for hyperlipidemia. We discussed many lifestyle modifications today in depth, and Cheryl [name] will continue to work on decreasing saturated fats such as fatty red meat, butter and many fried foods. She will also increase vegetables and lean protein in her diet and continue to work on exercise and weight loss efforts. We will check labs and Cheryl [name] agrees to follow up with our clinic in 2 weeks.  Elevated TSH We will recheck labs and follow. Cheryl [name] will continue to work on weight loss efforts and follow up with our clinic in 2 weeks.  Vitamin D Deficiency Cheryl [name] was informed that low vitamin D levels  contributes to fatigue and are associated with obesity, breast, and colon cancer. Cheryl [name] will continue to take OTC vitamin D 1,000 IU daily and she will follow up for routine testing of vitamin D, at least 2-3 times per year. She was informed of the risk of over-replacement of vitamin D and agrees to not increase her dose unless she discusses  this with [name] first. We will check labs and Cheryl [name] will follow up as directed.  Pre-Diabetes Cheryl [name] will continue to work on weight loss, exercise, and decreasing simple carbohydrates in her diet to help decrease the risk of diabetes. She was informed that eating too many simple carbohydrates or too many calories at one sitting increases the likelihood of GI side effects. We will check labs and Cheryl [name] will continue metformin and she will follow up with [name] as directed to monitor her progress.  Hypertension We discussed sodium restriction, working on healthy weight loss, and a regular exercise program as the means to achieve improved blood pressure control. Cheryl [name] agreed with this plan and agreed to follow up as directed. We will continue to monitor her blood pressure as well as her progress with the above lifestyle modifications. Cheryl [name] agrees to continue Losartan 100 mg daily #30 and she will watch for signs of hypotension as she continues her lifestyle modifications. We will check labs today and we will recheck her blood pressure in 2 weeks.  Cardiovascular risk counseling Cheryl [name] was given extended (15 minutes) coronary artery disease prevention counseling today. She is 63 y.o. female and has risk factors for heart disease including obesity, hyperlipidemia, prediabetes and hypertension. We discussed intensive lifestyle modifications today with an emphasis on specific weight loss instructions and strategies. Pt was also informed of the importance of increasing exercise and decreasing saturated fats to help prevent heart disease.  Obesity Cheryl [name] is currently in the action stage of change. As such, her goal is to continue with weight loss efforts She has agreed to keep a food journal with 1200 calories and 70 grams of protein daily Cheryl [name] has been instructed to work up to a goal of 150 minutes of combined cardio and strengthening exercise per week for weight loss and overall health benefits. We  discussed the following Behavioral Modification Strategies today: holiday eating strategies  and emotional eating strategies  Cheryl [name] has agreed to follow up with our clinic in 2 weeks. She was informed of the importance of frequent follow up visits to maximize her success with intensive lifestyle modifications for her multiple health conditions.  ALLERGIES: Allergies  Allergen Reactions  . Ace Inhibitors Cough  . Lisinopril Cough  . Neosporin [Neomycin-Bacitracin Zn-Polymyx] Rash  . Z-Pak [Azithromycin] Other (See Comments)    thrush    MEDICATIONS: Current Outpatient Medications on File Prior to Visit  Medication Sig Dispense Refill  . aspirin 81 MG chewable tablet Chew 81 mg by mouth daily.    . Cholecalciferol (VITAMIN D3) 1000 UNITS CAPS Take 1 capsule by mouth daily.     Marland Kitchen docusate sodium (COLACE) 100 MG capsule Take 100 mg by mouth daily.    . Loratadine (CLARITIN PO) Take 1 tablet by mouth daily.     . meloxicam (MOBIC) 15 MG tablet Take 1 tablet by mouth at bedtime.  3  . metFORMIN (GLUCOPHAGE) 500 MG tablet TAKE 1 TABLET(500 MG) BY MOUTH TWICE DAILY WITH A MEAL 60 tablet 0  . nitroGLYCERIN (NITROSTAT) 0.4 MG SL tablet Place 1 tablet (0.4 mg total) under the tongue every 5 (five) minutes as  needed for chest pain. 12 tablet 0  . oxybutynin (DITROPAN XL) 5 MG 24 hr tablet Take 1 tablet (5 mg total) by mouth at bedtime. 30 tablet 2  . UNABLE TO FIND C PAP for sleep apnea     . venlafaxine (EFFEXOR) 75 MG tablet Take 1 tablet (75 mg total) by mouth daily. 90 tablet 4   Current Facility-Administered Medications on File Prior to Visit  Medication Dose Route Frequency Provider Last Rate Last Dose  . 0.9 %  sodium chloride infusion  500 mL Intravenous Once Armbruster, Carlota Raspberry, MD        PAST MEDICAL HISTORY: Past Medical History:  Diagnosis Date  . Allergy   . Arthritis   . Chest pain   . Depression   . Fatigue   . GERD (gastroesophageal reflux disease)   . Hyperlipidemia    . Hypertension   . Joint pain   . Obesity   . Osteopenia   . Palpitation   . Sleep apnea    uses CPAP   . SOB (shortness of breath)   . Urinary incontinence     PAST SURGICAL HISTORY: Past Surgical History:  Procedure Laterality Date  . ABDOMINAL HYSTERECTOMY  08/10/2000   TAH/RSO  . BREAST SURGERY     Biopsy-Benign  . COLOSTOMY  2000   Dr.Patterson  . KNEE ARTHROSCOPY     left knee  . LAPAROSCOPIC CHOLECYSTECTOMY  2010  . LAPAROTOMY  1990   Fibroid removed-ovarian cyst   . RHINOPLASTY      SOCIAL HISTORY: Social History   Tobacco Use  . Smoking status: Former Smoker    Quit date: 08/15/2007    Years since quitting: 11.9  . Smokeless tobacco: Never Used  Substance Use Topics  . Alcohol use: Yes    Alcohol/week: 3.0 standard drinks    Types: 3 Standard drinks or equivalent per week    Comment: 3 drinks per week per pt   . Drug use: No    FAMILY HISTORY: Family History  Problem Relation Age of Onset  . Breast cancer Mother        Age 83  . Hypertension Mother   . Depression Mother   . Hypertension Father   . Arthritis Father   . Heart disease Father   . Cancer Father        Spleen  . Sleep apnea Father   . Heart disease Maternal Grandfather   . Colon cancer Paternal Grandfather        Colon cancer  . Esophageal cancer Neg Hx   . Stomach cancer Neg Hx     ROS: Review of Systems  Constitutional: Positive for weight loss. Negative for malaise/fatigue.  Cardiovascular: Negative for chest pain and palpitations.  Gastrointestinal: Negative for nausea and vomiting.  Musculoskeletal: Negative for myalgias.       Negative for muscle weakness  Endo/Heme/Allergies:       Positive for polyphagia    PHYSICAL EXAM: Blood pressure (!) 146/83, pulse 72, temperature 97.6 F (36.4 C), temperature source Oral, height 5\' 6"  (1.676 m), weight 222 lb (100.7 kg), SpO2 97 %. Body mass index is 35.83 kg/m. Physical Exam Vitals signs reviewed.  Constitutional:       Appearance: Normal appearance. She is well-developed. She is obese.  Cardiovascular:     Rate and Rhythm: Normal rate.  Pulmonary:     Effort: Pulmonary effort is normal.  Musculoskeletal: Normal range of motion.  Skin:    General: Skin  is warm and dry.  Neurological:     Mental Status: She is alert and oriented to person, place, and time.  Psychiatric:        Mood and Affect: Mood normal.        Behavior: Behavior normal.     RECENT LABS AND TESTS: BMET    Component Value Date/Time   NA 147 (H) 03/18/2019 0820   K 4.9 03/18/2019 0820   CL 108 (H) 03/18/2019 0820   CO2 24 03/18/2019 0820   GLUCOSE 105 (H) 03/18/2019 0820   GLUCOSE 93 08/28/2018 1333   BUN 28 (H) 03/18/2019 0820   CREATININE 0.83 03/18/2019 0820   CREATININE 0.81 06/08/2016 1353   CALCIUM 9.9 03/18/2019 0820   GFRNONAA 76 03/18/2019 0820   GFRAA 87 03/18/2019 0820   Lab Results  Component Value Date   HGBA1C 5.3 03/18/2019   HGBA1C 5.4 09/18/2018   HGBA1C 5.5 05/28/2018   HGBA1C 5.8 (H) 02/06/2018   Lab Results  Component Value Date   INSULIN 13.0 03/18/2019   INSULIN 4.5 09/18/2018   INSULIN 6.2 05/28/2018   INSULIN 8.7 02/06/2018   CBC    Component Value Date/Time   WBC 5.9 03/18/2019 0820   WBC 5.0 06/08/2016 1353   RBC 4.56 03/18/2019 0820   RBC 4.94 06/08/2016 1353   HGB 13.6 03/18/2019 0820   HGB 14.3 04/02/2014 0937   HCT 41.6 03/18/2019 0820   PLT 263 06/08/2016 1353   MCV 91 03/18/2019 0820   MCH 29.8 03/18/2019 0820   MCH 27.5 06/08/2016 1353   MCHC 32.7 03/18/2019 0820   MCHC 31.2 (L) 06/08/2016 1353   RDW 13.8 03/18/2019 0820   LYMPHSABS 1.3 03/18/2019 0820   MONOABS 450 06/08/2016 1353   EOSABS 0.2 03/18/2019 0820   BASOSABS 0.1 03/18/2019 0820   Iron/TIBC/Ferritin/ %Sat No results found for: IRON, TIBC, FERRITIN, IRONPCTSAT Lipid Panel     Component Value Date/Time   CHOL 218 (H) 03/18/2019 0820   TRIG 114 03/18/2019 0820   HDL 65 03/18/2019 0820   CHOLHDL  3.1 09/27/2017 1404   CHOLHDL 3.4 06/08/2016 1353   VLDL 19 06/08/2016 1353   LDLCALC 130 (H) 03/18/2019 0820   Hepatic Function Panel     Component Value Date/Time   PROT 6.7 03/18/2019 0820   ALBUMIN 4.5 03/18/2019 0820   AST 13 03/18/2019 0820   ALT 14 03/18/2019 0820   ALKPHOS 81 03/18/2019 0820   BILITOT 0.3 03/18/2019 0820      Component Value Date/Time   TSH 5.910 (H) 03/18/2019 0820   TSH 3.430 05/28/2018 0950   TSH 5.100 (H) 02/06/2018 0941    Results for VASILIKI, MEDELLIN "[name]" (MRN [id number]) as of 07/15/2019 14:09  Ref. Range 03/18/2019 08:20  Vitamin D, 25-Hydroxy Latest Ref Range: 30.0 - 100.0 ng/mL 56.7    OBESITY BEHAVIORAL INTERVENTION VISIT  Today's visit was # 29  Starting weight: 281 lbs Starting date: 02/06/2018 Today's weight : 222 lbs Today's date: 07/15/2019 Total lbs lost to date: 59    07/15/2019  Height 5\' 6"  (1.676 m)  Weight 222 lb (100.7 kg)  BMI (Calculated) 35.85  BLOOD PRESSURE - SYSTOLIC [id number]  BLOOD PRESSURE - DIASTOLIC 83   Body Fat % 48 %  Total Body Water (lbs) 78.6 lbs    ASK: We discussed the diagnosis of obesity with Tally Due today and Kiyani agreed to give [name] permission to discuss obesity behavioral modification therapy today.  ASSESS: Eniah has  the diagnosis of obesity and her BMI today is 35.85 Laddie is in the action stage of change   ADVISE: Makeda was educated on the multiple health risks of obesity as well as the benefit of weight loss to improve her health. She was advised of the need for long term treatment and the importance of lifestyle modifications to improve her current health and to decrease her risk of future health problems.  AGREE: Multiple dietary modification options and treatment options were discussed and  Mearl agreed to follow the recommendations documented in the above note.  ARRANGE: Celica was educated on the importance of frequent visits to treat obesity as outlined per CMS and USPSTF  guidelines and agreed to schedule her next follow up appointment today.  I, Doreene Nest, am acting as transcriptionist for Dennard Nip, MD  I have reviewed the above documentation for accuracy and completeness, and I agree with the above. -Dennard Nip, MD

## 2019-07-16 LAB — COMPREHENSIVE METABOLIC PANEL
ALT: 11 IU/L (ref 0–32)
AST: 12 IU/L (ref 0–40)
Albumin/Globulin Ratio: 1.7 (ref 1.2–2.2)
Albumin: 4.5 g/dL (ref 3.8–4.8)
Alkaline Phosphatase: 84 IU/L (ref 39–117)
BUN/Creatinine Ratio: 30 — ABNORMAL HIGH (ref 12–28)
BUN: 26 mg/dL (ref 8–27)
Bilirubin Total: 0.5 mg/dL (ref 0.0–1.2)
CO2: 22 mmol/L (ref 20–29)
Calcium: 9.4 mg/dL (ref 8.7–10.3)
Chloride: 103 mmol/L (ref 96–106)
Creatinine, Ser: 0.86 mg/dL (ref 0.57–1.00)
GFR calc Af Amer: 83 mL/min/{1.73_m2} (ref >59)
GFR calc non Af Amer: 72 mL/min/{1.73_m2} (ref >59)
Globulin, Total: 2.6 g/dL (ref 1.5–4.5)
Glucose: 89 mg/dL (ref 65–99)
Potassium: 4.4 mmol/L (ref 3.5–5.2)
Sodium: 140 mmol/L (ref 134–144)
Total Protein: 7.1 g/dL (ref 6.0–8.5)

## 2019-07-16 LAB — LIPID PANEL WITH LDL/HDL RATIO
Cholesterol, Total: 211 mg/dL — ABNORMAL HIGH (ref 100–199)
HDL: 73 mg/dL (ref >39)
LDL Chol Calc (NIH): 122 mg/dL — ABNORMAL HIGH (ref 0–99)
LDL/HDL Ratio: 1.7 ratio (ref 0.0–3.2)
Triglycerides: 93 mg/dL (ref 0–149)
VLDL Cholesterol Cal: 16 mg/dL (ref 5–40)

## 2019-07-16 LAB — VITAMIN D 25 HYDROXY (VIT D DEFICIENCY, FRACTURES): Vit D, 25-Hydroxy: 44.4 ng/mL (ref 30.0–100.0)

## 2019-07-16 LAB — HEMOGLOBIN A1C
Est. average glucose Bld gHb Est-mCnc: 111 mg/dL
Hgb A1c MFr Bld: 5.5 % (ref 4.8–5.6)

## 2019-07-16 LAB — SPECIMEN STATUS REPORT

## 2019-07-16 LAB — TSH: TSH: 4.63 u[IU]/mL — ABNORMAL HIGH (ref 0.450–4.500)

## 2019-07-16 LAB — INSULIN, RANDOM: INSULIN: 8 u[IU]/mL (ref 2.6–24.9)

## 2019-07-16 LAB — T3: T3, Total: 111 ng/dL (ref 71–180)

## 2019-07-16 LAB — T4, FREE: Free T4: 1.18 ng/dL (ref 0.82–1.77)

## 2019-07-18 DIAGNOSIS — I10 Essential (primary) hypertension: Secondary | ICD-10-CM | POA: Diagnosis not present

## 2019-07-18 DIAGNOSIS — M545 Low back pain: Secondary | ICD-10-CM | POA: Diagnosis not present

## 2019-07-29 ENCOUNTER — Other Ambulatory Visit: Payer: Self-pay

## 2019-07-29 ENCOUNTER — Encounter (INDEPENDENT_AMBULATORY_CARE_PROVIDER_SITE_OTHER): Payer: Self-pay | Admitting: Family Medicine

## 2019-07-29 ENCOUNTER — Ambulatory Visit (INDEPENDENT_AMBULATORY_CARE_PROVIDER_SITE_OTHER): Payer: Federal, State, Local not specified - PPO | Admitting: Family Medicine

## 2019-07-29 VITALS — BP 139/83 | HR 68 | Temp 97.5°F | Ht 66.0 in | Wt 221.0 lb

## 2019-07-29 DIAGNOSIS — Z6835 Body mass index (BMI) 35.0-35.9, adult: Secondary | ICD-10-CM

## 2019-07-29 DIAGNOSIS — Z9189 Other specified personal risk factors, not elsewhere classified: Secondary | ICD-10-CM | POA: Diagnosis not present

## 2019-07-29 DIAGNOSIS — I10 Essential (primary) hypertension: Secondary | ICD-10-CM | POA: Diagnosis not present

## 2019-07-29 MED ORDER — LOSARTAN POTASSIUM 100 MG PO TABS: 100.0000 mg | ORAL_TABLET | Freq: Every day | ORAL | 0 refills | Status: DC

## 2019-07-29 NOTE — Progress Notes (Signed)
Office: [phone]  /  Fax: [phone]   HPI:  Chief Complaint: OBESITY Cheryl [name] is here to discuss her progress with her obesity treatment plan. She is keeping a food journal with 1200 calories and 70 grams of protein and states she is following her eating plan approximately 75% of the time. She states she is walking 15 minutes 2-3 times per week.  Cheryl [name] continues to do well with weight loss even over Thanksgiving. Hunger is controlled and she is working on decreasing snacking.  Today's visit was #30 Starting weight: 281 lbs Starting date: 02/06/2018 Today's weight: 221 lbs   Today's date: 07/29/2019 Total lbs lost to date: 60 Total lbs lost since last in-office visit: 1  Hypertension Cheryl [name] has a diagnosis of hypertension. Her blood pressure is improved since her last visit on medications, diet and weight loss. She feels her back pain contributed to the increased blood pressure and is improved now.  At risk for cardiovascular disease Cheryl [name] is at a higher than average risk for cardiovascular disease due to obesity. She currently denies any chest pain.  ASSESSMENT AND PLAN:  Essential hypertension - Plan: losartan (COZAAR) 100 MG tablet  At risk for heart disease  Class 2 severe obesity with serious comorbidity and body mass index (BMI) of 35.0 to 35.9 in adult, unspecified obesity type (Cheryl [name])  PLAN:  Hypertension Cheryl [name] is working on healthy weight loss and exercise to improve blood pressure control. She was given a refill on her losartan 100 mg #30 with 0 refills and she agrees to follow-up with our clinic in 4 weeks. We will watch for signs of hypotension as she continues her lifestyle modifications.  Cardiovascular risk counseling Cheryl [name] was given (~15 minutes) coronary artery disease prevention counseling today. She is 63 y.o. female and has risk factors for heart disease including obesity. We discussed intensive lifestyle modifications today with an emphasis on  specific weight loss instructions and strategies.   Obesity Cheryl [name] is currently in the action stage of change. As such, her goal is to continue with weight loss efforts. She has agreed to keep a food journal with 1200 calories and 70 grams of protein.  Cheryl [name] has been instructed to work up to a goal of 150 minutes of combined cardio and strengthening exercise per week for weight loss and overall health benefits. We discussed the following Behavioral Modification Stratagies today: increasing lean protein intake and holiday eating strategies.   Cheryl [name] has agreed to follow-up with our clinic in 4 weeks. She was informed of the importance of frequent follow-up visits to maximize her success with intensive lifestyle modifications for her multiple health conditions.  ALLERGIES: Allergies  Allergen Reactions  . Ace Inhibitors Cough  . Lisinopril Cough  . Neosporin [Neomycin-Bacitracin Zn-Polymyx] Rash  . Z-Pak [Azithromycin] Other (See Comments)    thrush    MEDICATIONS: Current Outpatient Medications on File Prior to Visit  Medication Sig Dispense Refill  . aspirin 81 MG chewable tablet Chew 81 mg by mouth daily.    . Cholecalciferol (VITAMIN D3) 1000 UNITS CAPS Take 1 capsule by mouth daily.     Marland Kitchen docusate sodium (COLACE) 100 MG capsule Take 100 mg by mouth daily.    . Loratadine (CLARITIN PO) Take 1 tablet by mouth daily.     . meloxicam (MOBIC) 15 MG tablet Take 1 tablet by mouth at bedtime.  3  . metFORMIN (GLUCOPHAGE) 500 MG tablet TAKE 1 TABLET(500 MG) BY MOUTH TWICE DAILY WITH A MEAL 60 tablet 0  .  nitroGLYCERIN (NITROSTAT) 0.4 MG SL tablet Place 1 tablet (0.4 mg total) under the tongue every 5 (five) minutes as needed for chest pain. 12 tablet 0  . oxybutynin (DITROPAN XL) 5 MG 24 hr tablet Take 1 tablet (5 mg total) by mouth at bedtime. 30 tablet 2  . UNABLE TO FIND C PAP for sleep apnea     . venlafaxine (EFFEXOR) 75 MG tablet Take 1 tablet (75 mg total) by mouth daily. 90 tablet  4   Current Facility-Administered Medications on File Prior to Visit  Medication Dose Route Frequency Provider Last Rate Last Admin  . 0.9 %  sodium chloride infusion  500 mL Intravenous Once Armbruster, Carlota Raspberry, MD        PAST MEDICAL HISTORY: Past Medical History:  Diagnosis Date  . Allergy   . Arthritis   . Chest pain   . Depression   . Fatigue   . GERD (gastroesophageal reflux disease)   . Hyperlipidemia   . Hypertension   . Joint pain   . Obesity   . Osteopenia   . Palpitation   . Sleep apnea    uses CPAP   . SOB (shortness of breath)   . Urinary incontinence     PAST SURGICAL HISTORY: Past Surgical History:  Procedure Laterality Date  . ABDOMINAL HYSTERECTOMY  08/10/2000   TAH/RSO  . BREAST SURGERY     Biopsy-Benign  . COLOSTOMY  2000   Dr.Patterson  . KNEE ARTHROSCOPY     left knee  . LAPAROSCOPIC CHOLECYSTECTOMY  2010  . LAPAROTOMY  1990   Fibroid removed-ovarian cyst   . RHINOPLASTY      SOCIAL HISTORY: Social History   Tobacco Use  . Smoking status: Former Smoker    Quit date: 08/15/2007    Years since quitting: 11.9  . Smokeless tobacco: Never Used  Substance Use Topics  . Alcohol use: Yes    Alcohol/week: 3.0 standard drinks    Types: 3 Standard drinks or equivalent per week    Comment: 3 drinks per week per pt   . Drug use: No    FAMILY HISTORY: Family History  Problem Relation Age of Onset  . Breast cancer Mother        Age 14  . Hypertension Mother   . Depression Mother   . Hypertension Father   . Arthritis Father   . Heart disease Father   . Cancer Father        Spleen  . Sleep apnea Father   . Heart disease Maternal Grandfather   . Colon cancer Paternal Grandfather        Colon cancer  . Esophageal cancer Neg Hx   . Stomach cancer Neg Hx    ROS: Review of Systems  Constitutional: Positive for weight loss (1 lb).   PHYSICAL EXAM: Blood pressure 139/83, pulse 68, temperature (!) 97.5 F (36.4 C), temperature source  Oral, height 5\' 6"  (1.676 m), weight 221 lb (100.2 kg), SpO2 98 %. Body mass index is 35.67 kg/m. Physical Exam Vitals reviewed.  Constitutional:      Appearance: Normal appearance. She is obese.  Cardiovascular:     Rate and Rhythm: Normal rate.     Pulses: Normal pulses.  Pulmonary:     Effort: Pulmonary effort is normal.     Breath sounds: Normal breath sounds.  Musculoskeletal:        General: Normal range of motion.  Skin:    General: Skin is warm and dry.  Neurological:     Mental Status: She is alert and oriented to person, place, and time.  Psychiatric:        Behavior: Behavior normal.   RECENT LABS AND TESTS: BMET    Component Value Date/Time   NA 140 07/15/2019 0000   K 4.4 07/15/2019 0000   CL 103 07/15/2019 0000   CO2 22 07/15/2019 0000   GLUCOSE 89 07/15/2019 0000   GLUCOSE 93 08/28/2018 1333   BUN 26 07/15/2019 0000   CREATININE 0.86 07/15/2019 0000   CREATININE 0.81 06/08/2016 1353   CALCIUM 9.4 07/15/2019 0000   GFRNONAA 72 07/15/2019 0000   GFRAA 83 07/15/2019 0000   Lab Results  Component Value Date   HGBA1C 5.5 07/15/2019   HGBA1C 5.3 03/18/2019   HGBA1C 5.4 09/18/2018   HGBA1C 5.5 05/28/2018   HGBA1C 5.8 (H) 02/06/2018   Lab Results  Component Value Date   INSULIN 8.0 07/15/2019   INSULIN 13.0 03/18/2019   INSULIN 4.5 09/18/2018   INSULIN 6.2 05/28/2018   INSULIN 8.7 02/06/2018   CBC    Component Value Date/Time   WBC 5.9 03/18/2019 0820   WBC 5.0 06/08/2016 1353   RBC 4.56 03/18/2019 0820   RBC 4.94 06/08/2016 1353   HGB 13.6 03/18/2019 0820   HGB 14.3 04/02/2014 0937   HCT 41.6 03/18/2019 0820   PLT 263 06/08/2016 1353   MCV 91 03/18/2019 0820   MCH 29.8 03/18/2019 0820   MCH 27.5 06/08/2016 1353   MCHC 32.7 03/18/2019 0820   MCHC 31.2 (L) 06/08/2016 1353   RDW 13.8 03/18/2019 0820   LYMPHSABS 1.3 03/18/2019 0820   MONOABS 450 06/08/2016 1353   EOSABS 0.2 03/18/2019 0820   BASOSABS 0.1 03/18/2019 0820    Iron/TIBC/Ferritin/ %Sat No results found for: IRON, TIBC, FERRITIN, IRONPCTSAT Lipid Panel     Component Value Date/Time   CHOL 211 (H) 07/15/2019 0000   TRIG 93 07/15/2019 0000   HDL 73 07/15/2019 0000   CHOLHDL 3.1 09/27/2017 1404   CHOLHDL 3.4 06/08/2016 1353   VLDL 19 06/08/2016 1353   LDLCALC 122 (H) 07/15/2019 0000   Hepatic Function Panel     Component Value Date/Time   PROT 7.1 07/15/2019 0000   ALBUMIN 4.5 07/15/2019 0000   AST 12 07/15/2019 0000   ALT 11 07/15/2019 0000   ALKPHOS 84 07/15/2019 0000   BILITOT 0.5 07/15/2019 0000      Component Value Date/Time   TSH 4.630 (H) 07/15/2019 0000   TSH 5.910 (H) 03/18/2019 0820   TSH 3.430 05/28/2018 0950      I, Michaelene Song, am acting as Location manager for Dennard Nip, MD I have reviewed the above documentation for accuracy and completeness, and I agree with the above. -Dennard Nip, MD

## 2019-08-20 ENCOUNTER — Other Ambulatory Visit: Payer: Self-pay | Admitting: Obstetrics & Gynecology

## 2019-08-21 NOTE — Telephone Encounter (Signed)
Medication refill request: Ditropan XL  Last AEX:  04-28-2019 SM  Next AEX: not currently scheduled  Last MMG (if hormonal medication request): n/a Refill authorized: Today, please advise.  Spoke with patient for update after starting ditropan. Patient states the medication "immediately made a difference and worked wonderful." Patient states she has been off of it for about a week now because she ran out and "I'm missing it." RN advised would send request to Dr. Sabra Heck for review. Patient agreeable. Pharmacy confirmed as [facility] on [address].   Medication pended for #30, 2RF. Please refill if appropriate.

## 2019-08-26 ENCOUNTER — Ambulatory Visit (INDEPENDENT_AMBULATORY_CARE_PROVIDER_SITE_OTHER): Payer: Federal, State, Local not specified - PPO | Admitting: Family Medicine

## 2019-08-26 ENCOUNTER — Other Ambulatory Visit: Payer: Self-pay

## 2019-08-26 ENCOUNTER — Other Ambulatory Visit (INDEPENDENT_AMBULATORY_CARE_PROVIDER_SITE_OTHER): Payer: Self-pay | Admitting: Family Medicine

## 2019-08-26 ENCOUNTER — Encounter (INDEPENDENT_AMBULATORY_CARE_PROVIDER_SITE_OTHER): Payer: Self-pay | Admitting: Family Medicine

## 2019-08-26 VITALS — BP 120/72 | HR 78 | Temp 98.4°F | Ht 66.0 in | Wt 225.0 lb

## 2019-08-26 DIAGNOSIS — I10 Essential (primary) hypertension: Secondary | ICD-10-CM

## 2019-08-26 DIAGNOSIS — Z9189 Other specified personal risk factors, not elsewhere classified: Secondary | ICD-10-CM

## 2019-08-26 DIAGNOSIS — Z6836 Body mass index (BMI) 36.0-36.9, adult: Secondary | ICD-10-CM | POA: Diagnosis not present

## 2019-08-26 MED ORDER — LOSARTAN POTASSIUM 100 MG PO TABS: 100.0000 mg | ORAL_TABLET | Freq: Every day | ORAL | 0 refills | Status: DC

## 2019-08-27 NOTE — Progress Notes (Signed)
Chief Complaint:   OBESITY Cheryl [name] Score is here to discuss her progress with her obesity treatment plan along with follow-up of her obesity related diagnoses. Cheryl [name] is keeping a food journal and adhering to recommended goals of 1200 calories and 70+ grams of protein and states she is following her eating plan approximately 35% of the time. Cheryl [name] states she is walking 30 minutes 3 to 4 times per week.  Today's visit was #: 51 Starting weight: 281 lbs Starting date: 02/06/18 Today's weight: 225 lbs Today's date: 08/26/2019 Total lbs lost to date: 56 Total lbs lost since last in-office visit: 0  Interim History: [name] did some celebration eating over the holidays, but got back on track with her journaling last week. She is working on increasing lean protein and is doing better with meal planning.  Subjective:   1. Essential hypertension Review: taking medications as instructed, no medication side effects noted, no TIAs, no chest pain on exertion, no dyspnea on exertion, no swelling of ankles. Cheryl [name]'s blood pressure is well controlled on losartan. She denies chest pain or lightheadedness.  BP Readings from Last 3 Encounters:  08/26/19 120/72  07/29/19 139/83  07/15/19 (!) 146/83   2. At risk for heart disease Cheryl [name] is at a higher than average risk for cardiovascular disease due to hypertension and obesity. Reviewed: no chest pain on exertion, no dyspnea on exertion, and no swelling of ankles.  Assessment/Plan:   1. Essential hypertension [name] is working on healthy weight loss and exercise to improve blood pressure control. We will watch for signs of hypotension as she continues her lifestyle modifications. She agrees to continue her diet and exercise.  - losartan (COZAAR) 100 MG tablet; Take 1 tablet (100 mg total) by mouth daily.  Dispense: 30 tablet; Refill: 0  2. At risk for heart disease Cheryl [name] was given approximately 15 minutes of coronary artery disease prevention  counseling today. She is 64 y.o. female and has risk factors for heart disease including hypertension and obesity. We discussed intensive lifestyle modifications today with an emphasis on specific weight loss instructions and strategies.   3. Class 2 severe obesity with serious comorbidity and body mass index (BMI) of 36.0 to 36.9 in adult, unspecified obesity type [facility])  Cheryl [name] is currently in the action stage of change. As such, her goal is to continue with weight loss efforts. She has agreed to keep a food journal and adhering to recommended goals of 1200 calories and 70+ grams of protein.   We discussed the following behavioral modification strategies today: increasing lean protein intake and decreasing simple carbohydrates.  Cheryl [name] Score has agreed to follow-up with our clinic in 3 to 4 weeks. She was informed of the importance of frequent follow-up visits to maximize her success with intensive lifestyle modifications for her multiple health conditions.   Objective:   Blood pressure 120/72, pulse 78, temperature 98.4 F (36.9 C), temperature source Oral, height 5\' 6"  (1.676 m), weight 225 lb (102.1 kg), SpO2 98 %. Body mass index is 36.32 kg/m.  General: Cooperative, alert, well developed, in no acute distress. HEENT: Conjunctivae and lids unremarkable. Neck: No thyromegaly.  Cardiovascular: Regular rhythm.  Lungs: Normal work of breathing. Extremities: No edema.  Neurologic: No focal deficits.   Lab Results  Component Value Date   CREATININE 0.86 07/15/2019   BUN 26 07/15/2019   NA 140 07/15/2019   K 4.4 07/15/2019   CL 103 07/15/2019   CO2 22 07/15/2019  Lab Results  Component Value Date   ALT 11 07/15/2019   AST 12 07/15/2019   ALKPHOS 84 07/15/2019   BILITOT 0.5 07/15/2019   Lab Results  Component Value Date   HGBA1C 5.5 07/15/2019   HGBA1C 5.3 03/18/2019   HGBA1C 5.4 09/18/2018   HGBA1C 5.5 05/28/2018   HGBA1C 5.8 (H) 02/06/2018   Lab Results  Component  Value Date   INSULIN 8.0 07/15/2019   INSULIN 13.0 03/18/2019   INSULIN 4.5 09/18/2018   INSULIN 6.2 05/28/2018   INSULIN 8.7 02/06/2018   Lab Results  Component Value Date   TSH 4.630 (H) 07/15/2019   Lab Results  Component Value Date   CHOL 211 (H) 07/15/2019   HDL 73 07/15/2019   LDLCALC 122 (H) 07/15/2019   TRIG 93 07/15/2019   CHOLHDL 3.1 09/27/2017   Lab Results  Component Value Date   WBC 5.9 03/18/2019   HGB 13.6 03/18/2019   HCT 41.6 03/18/2019   MCV 91 03/18/2019   PLT 263 06/08/2016   No results found for: IRON, TIBC, FERRITIN   Attestation Statements:   Reviewed by clinician on day of visit: allergies, medications, problem list, medical history, surgical history, family history, social history, and previous encounter notes.  IMarcille Blanco, CMA, am acting as transcriptionist for Starlyn Skeans, MD  I have reviewed the above documentation for accuracy and completeness, and I agree with the above. -  Dennard Nip, MD

## 2019-09-24 ENCOUNTER — Encounter (INDEPENDENT_AMBULATORY_CARE_PROVIDER_SITE_OTHER): Payer: Self-pay | Admitting: Family Medicine

## 2019-09-24 ENCOUNTER — Other Ambulatory Visit (INDEPENDENT_AMBULATORY_CARE_PROVIDER_SITE_OTHER): Payer: Self-pay | Admitting: Family Medicine

## 2019-09-24 ENCOUNTER — Other Ambulatory Visit: Payer: Self-pay

## 2019-09-24 ENCOUNTER — Ambulatory Visit (INDEPENDENT_AMBULATORY_CARE_PROVIDER_SITE_OTHER): Payer: Federal, State, Local not specified - PPO | Admitting: Family Medicine

## 2019-09-24 VITALS — BP 124/84 | HR 76 | Temp 97.5°F | Ht 66.0 in | Wt 228.0 lb

## 2019-09-24 DIAGNOSIS — Z6836 Body mass index (BMI) 36.0-36.9, adult: Secondary | ICD-10-CM

## 2019-09-24 DIAGNOSIS — K219 Gastro-esophageal reflux disease without esophagitis: Secondary | ICD-10-CM

## 2019-09-24 DIAGNOSIS — Z9189 Other specified personal risk factors, not elsewhere classified: Secondary | ICD-10-CM

## 2019-09-24 DIAGNOSIS — I10 Essential (primary) hypertension: Secondary | ICD-10-CM | POA: Diagnosis not present

## 2019-09-24 MED ORDER — LOSARTAN POTASSIUM 100 MG PO TABS: 100.0000 mg | ORAL_TABLET | Freq: Every day | ORAL | 0 refills | Status: DC

## 2019-09-24 NOTE — Progress Notes (Signed)
Chief Complaint:   OBESITY Cheryl [name] is here to discuss her progress with her obesity treatment plan along with follow-up of her obesity related diagnoses. Cheryl [name] is on keeping a food journal and adhering to recommended goals of 1200 calories and 70+ grams of protein daily and states she is following her eating plan approximately 25% of the time. Cheryl [name] states she is walking for 30 minutes 4 times per week.  Today's visit was #: 32 Starting weight: 281 lbs Starting date: 02/06/18 Today's weight: 228 lbs Today's date: 09/24/2019 Total lbs lost to date: 53 Total lbs lost since last in-office visit: 0  Interim History: Cheryl [name] has gotten off track with her journaling, and she hasn't been eating all of her protein. She thinks she would do better with a more structured plan during the week, and freedom for the weekends.  Subjective:   1. Essential hypertension Cheryl [name]'s blood pressure is well controlled on losartan. She is working on diet and weight loss.  2. Gastroesophageal reflux disease without esophagitis Cheryl [name] has been off her plan more, and she notes some GERD symptoms.  3. At risk for malnutrition Cheryl [name] is at increased risk for malnutrition due to inadequate protein intake.   Assessment/Plan:   1. Essential hypertension Cheryl [name] is working on healthy weight loss and exercise to improve blood pressure control. We will watch for signs of hypotension as she continues her lifestyle modifications. We will refill losartan for 1 month, and will continue to monitor.  - losartan (COZAAR) 100 MG tablet; Take 1 tablet (100 mg total) by mouth daily.  Dispense: 30 tablet; Refill: 0  2. Gastroesophageal reflux disease without esophagitis Intensive lifestyle modifications are the first line treatment for this issue. We discussed several lifestyle modifications today and she will get back to her diet prescription, and will continue to work on exercise and weight loss efforts. Orders and follow  up as documented in patient record.    3. At risk for malnutrition Cheryl [name] was given approximately 15 minutes of counseling today regarding prevention of malnutrition. Cheryl [name] was advised that having bariatric surgery increases her risk for anemia, malnutrition, and vitamin deficiencies.   4. Class 2 severe obesity with serious comorbidity and body mass index (BMI) of 36.0 to 36.9 in adult, unspecified obesity type [facility]) Cheryl [name] is currently in the action stage of change. As such, her goal is to continue with weight loss efforts. She has agreed to the Category 2 Plan or keeping a food journal and adhering to recommended goals of 1200 calories and 70+ grams of protein daily.   Behavioral modification strategies: increasing lean protein intake and meal planning and cooking strategies.  Cheryl [name] has agreed to follow-up with our clinic in 3 weeks. She was informed of the importance of frequent follow-up visits to maximize her success with intensive lifestyle modifications for her multiple health conditions.   Objective:   Blood pressure 124/84, pulse 76, temperature (!) 97.5 F (36.4 C), temperature source Oral, height 5\' 6"  (1.676 m), weight 228 lb (103.4 kg), SpO2 97 %. Body mass index is 36.8 kg/m.  General: Cooperative, alert, well developed, in no acute distress. HEENT: Conjunctivae and lids unremarkable. Cardiovascular: Regular rhythm.  Lungs: Normal work of breathing. Neurologic: No focal deficits.   Lab Results  Component Value Date   CREATININE 0.86 07/15/2019   BUN 26 07/15/2019   NA 140 07/15/2019   K 4.4 07/15/2019   CL 103 07/15/2019   CO2 22 07/15/2019   Lab Results  Component  Value Date   ALT 11 07/15/2019   AST 12 07/15/2019   ALKPHOS 84 07/15/2019   BILITOT 0.5 07/15/2019   Lab Results  Component Value Date   HGBA1C 5.5 07/15/2019   HGBA1C 5.3 03/18/2019   HGBA1C 5.4 09/18/2018   HGBA1C 5.5 05/28/2018   HGBA1C 5.8 (H) 02/06/2018   Lab Results  Component  Value Date   INSULIN 8.0 07/15/2019   INSULIN 13.0 03/18/2019   INSULIN 4.5 09/18/2018   INSULIN 6.2 05/28/2018   INSULIN 8.7 02/06/2018   Lab Results  Component Value Date   TSH 4.630 (H) 07/15/2019   Lab Results  Component Value Date   CHOL 211 (H) 07/15/2019   HDL 73 07/15/2019   LDLCALC 122 (H) 07/15/2019   TRIG 93 07/15/2019   CHOLHDL 3.1 09/27/2017   Lab Results  Component Value Date   WBC 5.9 03/18/2019   HGB 13.6 03/18/2019   HCT 41.6 03/18/2019   MCV 91 03/18/2019   PLT 263 06/08/2016   No results found for: IRON, TIBC, FERRITIN  Attestation Statements:   Reviewed by clinician on day of visit: allergies, medications, problem list, medical history, surgical history, family history, social history, and previous encounter notes.   I, Trixie Dredge, am acting as transcriptionist for Dennard Nip, MD.  I have reviewed the above documentation for accuracy and completeness, and I agree with the above. -  Dennard Nip, MD

## 2019-10-09 ENCOUNTER — Ambulatory Visit: Payer: Self-pay

## 2019-10-15 ENCOUNTER — Ambulatory Visit (INDEPENDENT_AMBULATORY_CARE_PROVIDER_SITE_OTHER): Payer: Federal, State, Local not specified - PPO | Admitting: Family Medicine

## 2019-10-15 ENCOUNTER — Other Ambulatory Visit (INDEPENDENT_AMBULATORY_CARE_PROVIDER_SITE_OTHER): Payer: Self-pay | Admitting: Family Medicine

## 2019-10-15 ENCOUNTER — Encounter (INDEPENDENT_AMBULATORY_CARE_PROVIDER_SITE_OTHER): Payer: Self-pay | Admitting: Family Medicine

## 2019-10-15 ENCOUNTER — Other Ambulatory Visit: Payer: Self-pay

## 2019-10-15 VITALS — BP 121/85 | HR 69 | Temp 97.5°F | Ht 66.0 in | Wt 225.0 lb

## 2019-10-15 DIAGNOSIS — R7303 Prediabetes: Secondary | ICD-10-CM

## 2019-10-15 DIAGNOSIS — M25561 Pain in right knee: Secondary | ICD-10-CM | POA: Diagnosis not present

## 2019-10-15 DIAGNOSIS — Z6836 Body mass index (BMI) 36.0-36.9, adult: Secondary | ICD-10-CM

## 2019-10-15 DIAGNOSIS — Z9189 Other specified personal risk factors, not elsewhere classified: Secondary | ICD-10-CM

## 2019-10-15 DIAGNOSIS — E8881 Metabolic syndrome: Secondary | ICD-10-CM | POA: Diagnosis not present

## 2019-10-15 MED ORDER — METFORMIN HCL 500 MG PO TABS: ORAL_TABLET | ORAL | 0 refills | Status: DC

## 2019-10-15 MED ORDER — MELOXICAM 15 MG PO TABS: 15.0000 mg | ORAL_TABLET | Freq: Every day | ORAL | 0 refills | Status: DC

## 2019-10-16 NOTE — Progress Notes (Signed)
Chief Complaint:   OBESITY Cheryl [name] is here to discuss her progress with her obesity treatment plan along with follow-up of her obesity related diagnoses. Cheryl [name] is on the Category 2 Plan or keeping a food journal and adhering to recommended goals of 1200 calories and 70+ grams of protein daily and states she is following her eating plan approximately 25% of the time. Cheryl [name] states she is walking for 30 minutes 3 times per week.  Today's visit was #: 48 Starting weight: 281 lbs Starting date: 02/06/2018 Today's weight: 225 lbs Today's date: 10/15/2019 Total lbs lost to date: 56 Total lbs lost since last in-office visit: 3  Interim History: Xzaria is doing well with weight loss, but she is struggling to follow her plan closely but trying to portion control and make smarter choices.  Subjective:   1. Insulin resistance Cheryl [name] is stable on metformin, but she is frequently forgetting her second dose.  2. Right knee pain, unspecified chronicity Cheryl [name] is stable on Mobic, but she is running out and no longer has a primary care physician.  3. At risk for impaired metabolic function Cheryl [name] is at increased risk for impaired metabolic function due to decrease in protein.  Assessment/Plan:   1. Insulin resistance Cheryl [name] will continue to work on weight loss, exercise, and decreasing simple carbohydrates to help decrease the risk of diabetes. We will refill metformin for 1 month (ok to take second dose at dinner). Cheryl [name] agreed to follow-up with [name] as directed to closely monitor her progress.  - metFORMIN (GLUCOPHAGE) 500 MG tablet; TAKE 1 TABLET(500 MG) BY MOUTH TWICE DAILY WITH A MEAL  Dispense: 60 tablet; Refill: 0  2. Right knee pain, unspecified chronicity We will refill Mobic for 1 month. Lojain agreed to establish care with a primary care physician.  - meloxicam (MOBIC) 15 MG tablet; Take 1 tablet (15 mg total) by mouth at bedtime.  Dispense: 30 tablet; Refill: 0  3. At risk for  impaired metabolic function Cheryl [name] was given approximately 15 minutes of impaired  metabolic function prevention counseling today. We discussed intensive lifestyle modifications today with an emphasis on specific nutrition and exercise instructions and strategies.   Repetitive spaced learning was employed today to elicit superior memory formation and behavioral change.  4. Class 2 severe obesity with serious comorbidity and body mass index (BMI) of 36.0 to 36.9 in adult, unspecified obesity type Cheryl [name]) Cheryl [name] is currently in the action stage of change. As such, her goal is to continue with weight loss efforts. She has agreed to the Category 2 Plan or keeping a food journal and adhering to recommended goals of 1200 calories and 70+ grams of protein daily.   Exercise goals: Cheryl [name] is to continue her current exercise regimen as is.  Behavioral modification strategies: increasing lean protein intake and meal planning and cooking strategies.  Cheryl [name] has agreed to follow-up with our clinic in 2 weeks. She was informed of the importance of frequent follow-up visits to maximize her success with intensive lifestyle modifications for her multiple health conditions.   Objective:   Blood pressure 121/85, pulse 69, temperature (!) 97.5 F (36.4 C), temperature source Oral, height 5\' 6"  (1.676 m), weight 225 lb (102.1 kg), SpO2 99 %. Body mass index is 36.32 kg/m.  General: Cooperative, alert, well developed, in no acute distress. HEENT: Conjunctivae and lids unremarkable. Cardiovascular: Regular rhythm.  Lungs: Normal work of breathing. Neurologic: No focal deficits.   Lab Results  Component Value Date   CREATININE  0.86 07/15/2019   BUN 26 07/15/2019   NA 140 07/15/2019   K 4.4 07/15/2019   CL 103 07/15/2019   CO2 22 07/15/2019   Lab Results  Component Value Date   ALT 11 07/15/2019   AST 12 07/15/2019   ALKPHOS 84 07/15/2019   BILITOT 0.5 07/15/2019   Lab Results  Component Value Date     HGBA1C 5.5 07/15/2019   HGBA1C 5.3 03/18/2019   HGBA1C 5.4 09/18/2018   HGBA1C 5.5 05/28/2018   HGBA1C 5.8 (H) 02/06/2018   Lab Results  Component Value Date   INSULIN 8.0 07/15/2019   INSULIN 13.0 03/18/2019   INSULIN 4.5 09/18/2018   INSULIN 6.2 05/28/2018   INSULIN 8.7 02/06/2018   Lab Results  Component Value Date   TSH 4.630 (H) 07/15/2019   Lab Results  Component Value Date   CHOL 211 (H) 07/15/2019   HDL 73 07/15/2019   LDLCALC 122 (H) 07/15/2019   TRIG 93 07/15/2019   CHOLHDL 3.1 09/27/2017   Lab Results  Component Value Date   WBC 5.9 03/18/2019   HGB 13.6 03/18/2019   HCT 41.6 03/18/2019   MCV 91 03/18/2019   PLT 263 06/08/2016   No results found for: IRON, TIBC, FERRITIN  Attestation Statements:   Reviewed by clinician on day of visit: allergies, medications, problem list, medical history, surgical history, family history, social history, and previous encounter notes.   I, Trixie Dredge, am acting as transcriptionist for Dennard Nip, MD.  I have reviewed the above documentation for accuracy and completeness, and I agree with the above. -  Dennard Nip, MD

## 2019-11-10 ENCOUNTER — Other Ambulatory Visit (INDEPENDENT_AMBULATORY_CARE_PROVIDER_SITE_OTHER): Payer: Self-pay | Admitting: Family Medicine

## 2019-11-10 DIAGNOSIS — I10 Essential (primary) hypertension: Secondary | ICD-10-CM

## 2019-11-12 ENCOUNTER — Other Ambulatory Visit (INDEPENDENT_AMBULATORY_CARE_PROVIDER_SITE_OTHER): Payer: Self-pay | Admitting: Family Medicine

## 2019-11-12 DIAGNOSIS — M25561 Pain in right knee: Secondary | ICD-10-CM

## 2019-11-12 DIAGNOSIS — E8881 Metabolic syndrome: Secondary | ICD-10-CM

## 2019-11-12 DIAGNOSIS — I10 Essential (primary) hypertension: Secondary | ICD-10-CM

## 2019-11-17 ENCOUNTER — Other Ambulatory Visit: Payer: Self-pay

## 2019-11-17 ENCOUNTER — Encounter (INDEPENDENT_AMBULATORY_CARE_PROVIDER_SITE_OTHER): Payer: Self-pay | Admitting: Family Medicine

## 2019-11-17 ENCOUNTER — Ambulatory Visit (INDEPENDENT_AMBULATORY_CARE_PROVIDER_SITE_OTHER): Payer: Federal, State, Local not specified - PPO | Admitting: Family Medicine

## 2019-11-17 VITALS — BP 134/78 | HR 70 | Temp 97.7°F | Ht 66.0 in | Wt 224.0 lb

## 2019-11-17 DIAGNOSIS — E8881 Metabolic syndrome: Secondary | ICD-10-CM | POA: Diagnosis not present

## 2019-11-17 DIAGNOSIS — Z6836 Body mass index (BMI) 36.0-36.9, adult: Secondary | ICD-10-CM | POA: Diagnosis not present

## 2019-11-17 DIAGNOSIS — I10 Essential (primary) hypertension: Secondary | ICD-10-CM | POA: Diagnosis not present

## 2019-11-17 MED ORDER — LOSARTAN POTASSIUM 100 MG PO TABS: ORAL_TABLET | ORAL | 0 refills | Status: DC

## 2019-11-17 MED ORDER — METFORMIN HCL 500 MG PO TABS: ORAL_TABLET | ORAL | 0 refills | Status: DC

## 2019-11-17 NOTE — Progress Notes (Signed)
Chief Complaint:   OBESITY Cheryl [name] is here to discuss her progress with her obesity treatment plan along with follow-up of her obesity related diagnoses. Cheryl [name] is on keeping a food journal and adhering to recommended goals of 1200 calories and 70+ grams of protein daily and states she is following her eating plan approximately 50% of the time. Cheryl [name] states she is is walking and gardening for 45 minutes 3 times per week.  Today's visit was #: 55 Starting weight: 281 lbs Starting date: 02/06/2018 Today's weight: 224 lbs Today's date: 11/17/2019 Total lbs lost to date: 27 Total lbs lost since last in-office visit: 1  Interim History: Cheryl [name] has deviated from journaling due to her hectic work schedule, and she is often skipping lunch. She is happy with journaling and will "get back on it". Her goal weight is 180 lbs.  Subjective:   1. Essential hypertension Cheryl [name]'s blood pressure is well controlled at her office visit. She does not check her blood pressure at home. She denies chest pain or dyspnea with exertion.  2. Insulin resistance Cheryl [name]'s last A1c on 07/15/2019 was 5.5 and insulin 8.0.  Assessment/Plan:   1. Essential hypertension Cheryl [name] is working on healthy weight loss and exercise to improve blood pressure control. We will refill losartan for 1 month. We will watch for signs of hypotension as she continues her lifestyle modifications.  - losartan (COZAAR) 100 MG tablet; TAKE 1 TABLET(100 MG) BY MOUTH DAILY  Dispense: 30 tablet; Refill: 0  2. Insulin resistance Cheryl [name] will continue to work on weight loss, exercise, and decreasing simple carbohydrates to help decrease the risk of diabetes. We will refill metformin for 1 month. Cheryl [name] agreed to follow-up with [name] as directed to closely monitor her progress.  - metFORMIN (GLUCOPHAGE) 500 MG tablet; TAKE 1 TABLET(500 MG) BY MOUTH TWICE DAILY WITH A MEAL  Dispense: 60 tablet; Refill: 0  3. Class 2 severe obesity with serious  comorbidity and body mass index (BMI) of 36.0 to 36.9 in adult, unspecified obesity type Cheryl [name]) Cheryl [name] is currently in the action stage of change. As such, her goal is to continue with weight loss efforts. She has agreed to keeping a food journal and adhering to recommended goals of 1200 calories and 70+ grams of protein daily.   Exercise goals: As is.  Behavioral modification strategies: increasing lean protein intake, no skipping meals and meal planning and cooking strategies.  Cheryl [name] has agreed to follow-up with our clinic in 4 weeks. She was informed of the importance of frequent follow-up visits to maximize her success with intensive lifestyle modifications for her multiple health conditions.   Objective:   Blood pressure 134/78, pulse 70, temperature 97.7 F (36.5 C), temperature source Oral, height 5\' 6"  (1.676 m), weight 224 lb (101.6 kg), SpO2 99 %. Body mass index is 36.15 kg/m.  General: Cooperative, alert, well developed, in no acute distress. HEENT: Conjunctivae and lids unremarkable. Cardiovascular: Regular rhythm.  Lungs: Normal work of breathing. Neurologic: No focal deficits.   Lab Results  Component Value Date   CREATININE 0.86 07/15/2019   BUN 26 07/15/2019   NA 140 07/15/2019   K 4.4 07/15/2019   CL 103 07/15/2019   CO2 22 07/15/2019   Lab Results  Component Value Date   ALT 11 07/15/2019   AST 12 07/15/2019   ALKPHOS 84 07/15/2019   BILITOT 0.5 07/15/2019   Lab Results  Component Value Date   HGBA1C 5.5 07/15/2019   HGBA1C 5.3 03/18/2019  HGBA1C 5.4 09/18/2018   HGBA1C 5.5 05/28/2018   HGBA1C 5.8 (H) 02/06/2018   Lab Results  Component Value Date   INSULIN 8.0 07/15/2019   INSULIN 13.0 03/18/2019   INSULIN 4.5 09/18/2018   INSULIN 6.2 05/28/2018   INSULIN 8.7 02/06/2018   Lab Results  Component Value Date   TSH 4.630 (H) 07/15/2019   Lab Results  Component Value Date   CHOL 211 (H) 07/15/2019   HDL 73 07/15/2019   LDLCALC 122 (H)  07/15/2019   TRIG 93 07/15/2019   CHOLHDL 3.1 09/27/2017   Lab Results  Component Value Date   WBC 5.9 03/18/2019   HGB 13.6 03/18/2019   HCT 41.6 03/18/2019   MCV 91 03/18/2019   PLT 263 06/08/2016   No results found for: IRON, TIBC, FERRITIN  Attestation Statements:   Reviewed by clinician on day of visit: allergies, medications, problem list, medical history, surgical history, family history, social history, and previous encounter notes.   I, Trixie Dredge, am acting as transcriptionist for Dennard Nip, MD.  I have reviewed the above documentation for accuracy and completeness, and I agree with the above. -  Dennard Nip, MD

## 2019-12-15 ENCOUNTER — Other Ambulatory Visit: Payer: Self-pay

## 2019-12-15 ENCOUNTER — Ambulatory Visit (INDEPENDENT_AMBULATORY_CARE_PROVIDER_SITE_OTHER): Payer: Federal, State, Local not specified - PPO | Admitting: Family Medicine

## 2019-12-15 ENCOUNTER — Encounter (INDEPENDENT_AMBULATORY_CARE_PROVIDER_SITE_OTHER): Payer: Self-pay | Admitting: Family Medicine

## 2019-12-15 VITALS — BP 122/83 | HR 77 | Temp 97.5°F | Ht 66.0 in | Wt 222.0 lb

## 2019-12-15 DIAGNOSIS — R7989 Other specified abnormal findings of blood chemistry: Secondary | ICD-10-CM | POA: Diagnosis not present

## 2019-12-15 DIAGNOSIS — E7849 Other hyperlipidemia: Secondary | ICD-10-CM

## 2019-12-15 DIAGNOSIS — E559 Vitamin D deficiency, unspecified: Secondary | ICD-10-CM

## 2019-12-15 DIAGNOSIS — E8881 Metabolic syndrome: Secondary | ICD-10-CM

## 2019-12-15 DIAGNOSIS — I10 Essential (primary) hypertension: Secondary | ICD-10-CM

## 2019-12-15 DIAGNOSIS — E66812 Obesity, class 2: Secondary | ICD-10-CM

## 2019-12-15 DIAGNOSIS — E88819 Insulin resistance, unspecified: Secondary | ICD-10-CM

## 2019-12-15 DIAGNOSIS — Z6835 Body mass index (BMI) 35.0-35.9, adult: Secondary | ICD-10-CM

## 2019-12-15 MED ORDER — LOSARTAN POTASSIUM 100 MG PO TABS: ORAL_TABLET | ORAL | 0 refills | Status: DC

## 2019-12-15 NOTE — Progress Notes (Signed)
Chief Complaint:   OBESITY Cheryl [name] is here to discuss her progress with her obesity treatment plan along with follow-up of her obesity related diagnoses. Cheryl [name] is on keeping a food journal and adhering to recommended goals of 1200 calories and 70+ grams of protein daily and states she is following her eating plan approximately 50% of the time. [name] states she is is gardening and walking for 20 minutes 4 times per week.  Today's visit was #: 40 Starting weight: 281 lbs Starting date: 02/06/2018 Today's weight: 222 lbs Today's date: 12/15/2019 Total lbs lost to date: 59 Total lbs lost since last in-office visit: 2  Interim History: Cheryl [name] continues to do well with weight loss with journaling. She is working on increasing protein and she feels she does a good job most days. Her hunger is controlled but she has been tempted by stress eating recently with increased stress from her son's mental health issues.  Subjective:   1. Vitamin D deficiency Cheryl [name] is stable on Vit D and she is due for labs.  2. Insulin resistance Cheryl [name] is working on diet and weight loss. She denies hypoglycemia. She is due for labs.  3. Other hyperlipidemia Cheryl [name] is stable on diet, and she is due for labs. She denies chest pain.  4. Elevated TSH Cheryl [name] has a history of mildly elevated TSH, but normal T3 and T4. She notes fatigue.  5. Essential hypertension Cheryl [name]'s blood pressure is stable on medications. She denies chest pain. She requests a refill today.  Assessment/Plan:   1. Vitamin D deficiency Low Vitamin D level contributes to fatigue and are associated with obesity, breast, and colon cancer. We will check labs today. [name] will follow-up for routine testing of Vitamin D, at least 2-3 times per year to avoid over-replacement.  - VITAMIN D 25 Hydroxy (Vit-D Deficiency, Fractures)  2. Insulin resistance [name] will continue to work on weight loss, exercise, and decreasing simple carbohydrates  to help decrease the risk of diabetes. we will check labs today. [name] agreed to follow-up with [name] as directed to closely monitor her progress.  - Hemoglobin A1c - Insulin, random  3. Other hyperlipidemia Cardiovascular risk and specific lipid/LDL goals reviewed. We discussed several lifestyle modifications today and Kip will continue to work on diet, exercise and weight loss efforts. We will check labs today. Orders and follow up as documented in patient record.   - Lipid Panel With LDL/HDL Ratio  4. Elevated TSH We will check labs today and will follow up.  - T3 - T4, free - TSH  5. Essential hypertension Cheryl [name] is working on healthy weight loss and exercise to improve blood pressure control. We will watch for signs of hypotension as she continues her lifestyle modifications. We will check labs today, and we will refill losartan for 1 month.  - Comprehensive metabolic panel  - losartan (COZAAR) 100 MG tablet; TAKE 1 TABLET(100 MG) BY MOUTH DAILY  Dispense: 30 tablet; Refill: 0  6. Class 2 severe obesity with serious comorbidity and body mass index (BMI) of 35.0 to 35.9 in adult, unspecified obesity type [facility]) Cheryl [name] is currently in the action stage of change. As such, her goal is to continue with weight loss efforts. She has agreed to keeping a food journal and adhering to recommended goals of 1200 calories and 70+ grams of protein daily.   Exercise goals: As is.  Behavioral modification strategies: emotional eating strategies and travel eating strategies.  Alpha has agreed to follow-up with our  clinic in 3 to 4 weeks. She was informed of the importance of frequent follow-up visits to maximize her success with intensive lifestyle modifications for her multiple health conditions.   Cheryl [name] was informed we would discuss her lab results at her next visit unless there is a critical issue that needs to be addressed sooner. Cheryl [name] agreed to keep her next visit at the agreed upon time  to discuss these results.  Objective:   Blood pressure 122/83, pulse 77, temperature (!) 97.5 F (36.4 C), temperature source Oral, height 5\' 6"  (1.676 m), weight 222 lb (100.7 kg), SpO2 98 %. Body mass index is 35.83 kg/m.  General: Cooperative, alert, well developed, in no acute distress. HEENT: Conjunctivae and lids unremarkable. Cardiovascular: Regular rhythm.  Lungs: Normal work of breathing. Neurologic: No focal deficits.   Lab Results  Component Value Date   CREATININE 0.86 07/15/2019   BUN 26 07/15/2019   NA 140 07/15/2019   K 4.4 07/15/2019   CL 103 07/15/2019   CO2 22 07/15/2019   Lab Results  Component Value Date   ALT 11 07/15/2019   AST 12 07/15/2019   ALKPHOS 84 07/15/2019   BILITOT 0.5 07/15/2019   Lab Results  Component Value Date   HGBA1C 5.5 07/15/2019   HGBA1C 5.3 03/18/2019   HGBA1C 5.4 09/18/2018   HGBA1C 5.5 05/28/2018   HGBA1C 5.8 (H) 02/06/2018   Lab Results  Component Value Date   INSULIN 8.0 07/15/2019   INSULIN 13.0 03/18/2019   INSULIN 4.5 09/18/2018   INSULIN 6.2 05/28/2018   INSULIN 8.7 02/06/2018   Lab Results  Component Value Date   TSH 4.630 (H) 07/15/2019   Lab Results  Component Value Date   CHOL 211 (H) 07/15/2019   HDL 73 07/15/2019   LDLCALC 122 (H) 07/15/2019   TRIG 93 07/15/2019   CHOLHDL 3.1 09/27/2017   Lab Results  Component Value Date   WBC 5.9 03/18/2019   HGB 13.6 03/18/2019   HCT 41.6 03/18/2019   MCV 91 03/18/2019   PLT 263 06/08/2016   No results found for: IRON, TIBC, FERRITIN  Attestation Statements:   Reviewed by clinician on day of visit: allergies, medications, problem list, medical history, surgical history, family history, social history, and previous encounter notes.   I, Trixie Dredge, am acting as transcriptionist for Dennard Nip, MD.  I have reviewed the above documentation for accuracy and completeness, and I agree with the above. -  Dennard Nip, MD\

## 2019-12-16 LAB — LIPID PANEL WITH LDL/HDL RATIO
Cholesterol, Total: 217 mg/dL — ABNORMAL HIGH (ref 100–199)
HDL: 74 mg/dL (ref >39)
LDL Chol Calc (NIH): 128 mg/dL — ABNORMAL HIGH (ref 0–99)
LDL/HDL Ratio: 1.7 ratio (ref 0.0–3.2)
Triglycerides: 85 mg/dL (ref 0–149)
VLDL Cholesterol Cal: 15 mg/dL (ref 5–40)

## 2019-12-16 LAB — COMPREHENSIVE METABOLIC PANEL
ALT: 12 IU/L (ref 0–32)
AST: 18 IU/L (ref 0–40)
Albumin/Globulin Ratio: 2 (ref 1.2–2.2)
Albumin: 4.5 g/dL (ref 3.8–4.8)
Alkaline Phosphatase: 97 IU/L (ref 39–117)
BUN/Creatinine Ratio: 30 — ABNORMAL HIGH (ref 12–28)
BUN: 24 mg/dL (ref 8–27)
Bilirubin Total: 0.3 mg/dL (ref 0.0–1.2)
CO2: 21 mmol/L (ref 20–29)
Calcium: 9.3 mg/dL (ref 8.7–10.3)
Chloride: 106 mmol/L (ref 96–106)
Creatinine, Ser: 0.81 mg/dL (ref 0.57–1.00)
GFR calc Af Amer: 89 mL/min/{1.73_m2} (ref >59)
GFR calc non Af Amer: 78 mL/min/{1.73_m2} (ref >59)
Globulin, Total: 2.3 g/dL (ref 1.5–4.5)
Glucose: 83 mg/dL (ref 65–99)
Potassium: 4.8 mmol/L (ref 3.5–5.2)
Sodium: 142 mmol/L (ref 134–144)
Total Protein: 6.8 g/dL (ref 6.0–8.5)

## 2019-12-16 LAB — INSULIN, RANDOM: INSULIN: 5 u[IU]/mL (ref 2.6–24.9)

## 2019-12-16 LAB — TSH: TSH: 5.43 u[IU]/mL — ABNORMAL HIGH (ref 0.450–4.500)

## 2019-12-16 LAB — HEMOGLOBIN A1C
Est. average glucose Bld gHb Est-mCnc: 108 mg/dL
Hgb A1c MFr Bld: 5.4 % (ref 4.8–5.6)

## 2019-12-16 LAB — VITAMIN D 25 HYDROXY (VIT D DEFICIENCY, FRACTURES): Vit D, 25-Hydroxy: 59.7 ng/mL (ref 30.0–100.0)

## 2019-12-16 LAB — T4, FREE: Free T4: 1.16 ng/dL (ref 0.82–1.77)

## 2019-12-16 LAB — T3: T3, Total: 99 ng/dL (ref 71–180)

## 2019-12-30 ENCOUNTER — Other Ambulatory Visit (INDEPENDENT_AMBULATORY_CARE_PROVIDER_SITE_OTHER): Payer: Self-pay | Admitting: Family Medicine

## 2019-12-30 DIAGNOSIS — I10 Essential (primary) hypertension: Secondary | ICD-10-CM

## 2020-01-05 ENCOUNTER — Other Ambulatory Visit: Payer: Self-pay

## 2020-01-05 ENCOUNTER — Ambulatory Visit (INDEPENDENT_AMBULATORY_CARE_PROVIDER_SITE_OTHER): Payer: Federal, State, Local not specified - PPO | Admitting: Family Medicine

## 2020-01-05 ENCOUNTER — Other Ambulatory Visit (INDEPENDENT_AMBULATORY_CARE_PROVIDER_SITE_OTHER): Payer: Self-pay | Admitting: Family Medicine

## 2020-01-05 ENCOUNTER — Encounter (INDEPENDENT_AMBULATORY_CARE_PROVIDER_SITE_OTHER): Payer: Self-pay | Admitting: Family Medicine

## 2020-01-05 VITALS — BP 133/93 | HR 80 | Temp 98.1°F | Ht 66.0 in | Wt 222.0 lb

## 2020-01-05 DIAGNOSIS — E8881 Metabolic syndrome: Secondary | ICD-10-CM

## 2020-01-05 DIAGNOSIS — E559 Vitamin D deficiency, unspecified: Secondary | ICD-10-CM

## 2020-01-05 DIAGNOSIS — I10 Essential (primary) hypertension: Secondary | ICD-10-CM | POA: Diagnosis not present

## 2020-01-05 DIAGNOSIS — Z9189 Other specified personal risk factors, not elsewhere classified: Secondary | ICD-10-CM | POA: Diagnosis not present

## 2020-01-05 DIAGNOSIS — Z6836 Body mass index (BMI) 36.0-36.9, adult: Secondary | ICD-10-CM

## 2020-01-05 MED ORDER — LOSARTAN POTASSIUM 100 MG PO TABS: ORAL_TABLET | ORAL | 0 refills | Status: DC

## 2020-01-05 NOTE — Progress Notes (Signed)
Chief Complaint:   OBESITY Cheryl [name] is here to discuss her progress with her obesity treatment plan along with follow-up of her obesity related diagnoses. Cheryl [name] is on keeping a food journal and adhering to recommended goals of 1200 calories and 70+ grams of protein daily and states she is following her eating plan approximately 10% of the time. Cheryl [name] states she is doing 0 minutes 0 times per week.  Today's visit was #: 47 Starting weight: 281 lbs Starting date: 02/06/2018 Today's weight: 222 lbs Today's date: 01/05/2020 Total lbs lost to date: 59 Total lbs lost since last in-office visit: 0  Interim History: Cheryl [name] has done well maintaining her weight since her last visit even with increased stress and meal planning. She has had to do more grab and go. She has done better with being mindful of her food choices, but she notes some increased PM boredom and stress eating.  Subjective:   1. Essential hypertension Fannie's diastolic blood pressure was slightly elevated today, and is worsening. She didn't take her medications this morning as she has run out of her medications. She is doing well with diet and her labs are within normal limits. I discussed labs with the patient today.  2. Insulin resistance Cheryl [name] is very well controlled with diet and metformin. She denies hypoglycemia. I discussed labs with the patient today.  3. Vitamin D deficiency Cheryl [name]'s vit D level is now at goal on Vit D OTC. She denies nausea, vomiting, or muscle weakness. I discussed labs with the patient today.  4. At risk for dehydration Cheryl [name] is at risk for dehydration due to weight loss and increased temperatures outside.  Assessment/Plan:   1. Essential hypertension Cheryl [name] is working on healthy weight loss and exercise to improve blood pressure control. We will watch for signs of hypotension as she continues her lifestyle modifications. We will refill Cozaar for 1 month.  - losartan (COZAAR) 100 MG  tablet; TAKE 1 TABLET(100 MG) BY MOUTH DAILY  Dispense: 30 tablet; Refill: 0  2. Insulin resistance Kaneshia will continue metformin, and will continue to work on weight loss, diet, exercise, and decreasing simple carbohydrates to help decrease the risk of diabetes. Cheryl [name] agreed to follow-up with Cheryl [name] as directed to closely monitor her progress.  3. Vitamin D deficiency Low Vitamin D level contributes to fatigue and are associated with obesity, breast, and colon cancer. Cheryl [name] agreed to continue taking OTC Vit D 1,000 IU daily and will follow-up for routine testing of Vitamin D, at least 2-3 times per year to avoid over-replacement.  4. At risk for dehydration Cheryl [name] was given approximately 15 minutes dehydration prevention counseling today. Cheryl [name] is at risk for dehydration due to weight loss and current medication(s). She was encouraged to increase her water intake and monitor fluid status to avoid dehydration as well as weight loss plateaus.   5. Class 2 severe obesity with serious comorbidity and body mass index (BMI) of 36.0 to 36.9 in adult, unspecified obesity type [facility]) Cheryl [name] is currently in the action stage of change. As such, her goal is to continue with weight loss efforts. She has agreed to keeping a food journal and adhering to recommended goals of 1200 calories and 70+ grams of protein daily.   Behavioral modification strategies: increasing water intake, meal planning and cooking strategies and emotional eating strategies.  Cheryl [name] has agreed to follow-up with our clinic in 3 to 4 weeks. She was informed of the importance of frequent follow-up visits to maximize her  success with intensive lifestyle modifications for her multiple health conditions.   Objective:   Blood pressure (!) 133/93, pulse 80, temperature 98.1 F (36.7 C), temperature source Oral, height 5\' 6"  (1.676 m), weight 222 lb (100.7 kg), SpO2 97 %. Body mass index is 35.83 kg/m.  General: Cooperative, alert, well  developed, in no acute distress. HEENT: Conjunctivae and lids unremarkable. Cardiovascular: Regular rhythm.  Lungs: Normal work of breathing. Neurologic: No focal deficits.   Lab Results  Component Value Date   CREATININE 0.81 12/15/2019   BUN 24 12/15/2019   NA 142 12/15/2019   K 4.8 12/15/2019   CL 106 12/15/2019   CO2 21 12/15/2019   Lab Results  Component Value Date   ALT 12 12/15/2019   AST 18 12/15/2019   ALKPHOS 97 12/15/2019   BILITOT 0.3 12/15/2019   Lab Results  Component Value Date   HGBA1C 5.4 12/15/2019   HGBA1C 5.5 07/15/2019   HGBA1C 5.3 03/18/2019   HGBA1C 5.4 09/18/2018   HGBA1C 5.5 05/28/2018   Lab Results  Component Value Date   INSULIN 5.0 12/15/2019   INSULIN 8.0 07/15/2019   INSULIN 13.0 03/18/2019   INSULIN 4.5 09/18/2018   INSULIN 6.2 05/28/2018   Lab Results  Component Value Date   TSH 5.430 (H) 12/15/2019   Lab Results  Component Value Date   CHOL 217 (H) 12/15/2019   HDL 74 12/15/2019   LDLCALC 128 (H) 12/15/2019   TRIG 85 12/15/2019   CHOLHDL 3.1 09/27/2017   Lab Results  Component Value Date   WBC 5.9 03/18/2019   HGB 13.6 03/18/2019   HCT 41.6 03/18/2019   MCV 91 03/18/2019   PLT 263 06/08/2016   No results found for: IRON, TIBC, FERRITIN  Attestation Statements:   Reviewed by clinician on day of visit: allergies, medications, problem list, medical history, surgical history, family history, social history, and previous encounter notes.   I, Trixie Dredge, am acting as transcriptionist for Dennard Nip, MD.  I have reviewed the above documentation for accuracy and completeness, and I agree with the above. -  Dennard Nip, MD

## 2020-02-02 ENCOUNTER — Other Ambulatory Visit (INDEPENDENT_AMBULATORY_CARE_PROVIDER_SITE_OTHER): Payer: Self-pay | Admitting: Family Medicine

## 2020-02-02 ENCOUNTER — Encounter (INDEPENDENT_AMBULATORY_CARE_PROVIDER_SITE_OTHER): Payer: Self-pay | Admitting: Family Medicine

## 2020-02-02 ENCOUNTER — Other Ambulatory Visit: Payer: Self-pay

## 2020-02-02 ENCOUNTER — Ambulatory Visit (INDEPENDENT_AMBULATORY_CARE_PROVIDER_SITE_OTHER): Payer: Federal, State, Local not specified - PPO | Admitting: Family Medicine

## 2020-02-02 VITALS — BP 128/80 | HR 63 | Temp 97.8°F | Ht 66.0 in | Wt 227.0 lb

## 2020-02-02 DIAGNOSIS — M25561 Pain in right knee: Secondary | ICD-10-CM

## 2020-02-02 DIAGNOSIS — E8881 Metabolic syndrome: Secondary | ICD-10-CM

## 2020-02-02 DIAGNOSIS — I10 Essential (primary) hypertension: Secondary | ICD-10-CM

## 2020-02-02 DIAGNOSIS — Z9189 Other specified personal risk factors, not elsewhere classified: Secondary | ICD-10-CM | POA: Diagnosis not present

## 2020-02-02 DIAGNOSIS — M170 Bilateral primary osteoarthritis of knee: Secondary | ICD-10-CM | POA: Diagnosis not present

## 2020-02-02 DIAGNOSIS — Z6836 Body mass index (BMI) 36.0-36.9, adult: Secondary | ICD-10-CM

## 2020-02-02 DIAGNOSIS — E559 Vitamin D deficiency, unspecified: Secondary | ICD-10-CM

## 2020-02-02 MED ORDER — METFORMIN HCL 500 MG PO TABS: ORAL_TABLET | ORAL | 0 refills | Status: DC

## 2020-02-02 MED ORDER — LOSARTAN POTASSIUM 100 MG PO TABS: ORAL_TABLET | ORAL | 0 refills | Status: DC

## 2020-02-02 MED ORDER — MELOXICAM 15 MG PO TABS: 15.0000 mg | ORAL_TABLET | Freq: Every day | ORAL | 0 refills | Status: DC

## 2020-02-04 NOTE — Progress Notes (Signed)
Chief Complaint:   OBESITY Cheryl [name] is here to discuss her progress with her obesity treatment plan along with follow-up of her obesity related diagnoses. Cheryl [name] is on keeping a food journal and adhering to recommended goals of 1200 calories and 70+ grams of protein daily and states she is following her eating plan approximately 50% of the time. Cheryl [name] states she is walking for 30 minutes 2 times per week.  Today's visit was #: 55 Starting weight: 281 lbs Starting date: 02/06/2018 Today's weight: 227 lbs Today's date: 02/02/2020 Total lbs lost to date: 54 Total lbs lost since last in-office visit: 0  Interim History: Cheryl [name] is retaining fluid today but she has otherwise done well maintaining her weight. She is still dealing with her son's health issues which is stressful.  Subjective:   1. Essential hypertension Cheryl [name]'s blood pressure is well controlled on Cozaar and diet.  2. Insulin resistance Cheryl [name] is tolerating metformin well and she denies nausea or vomiting.  3. Osteoarthritis of both knees, unspecified osteoarthritis type Cheryl [name] is stable on Mobic, and she takes it daily with food. She denies GI upset.  4. At risk for hyperglycemia Cheryl [name] is at increased risk for hyperglycemia due to insulin resistance.   Assessment/Plan:   1. Essential hypertension Cheryl [name] is working on healthy weight loss, diet, and exercise to improve blood pressure control. We will watch for signs of hypotension as she continues her lifestyle modifications. We will refill Cozaar for 1 month.  - losartan (COZAAR) 100 MG tablet; TAKE 1 TABLET(100 MG) BY MOUTH DAILY  Dispense: 30 tablet; Refill: 0  2. Insulin resistance Cheryl [name] will continue to work on weight loss, exercise, and decreasing simple carbohydrates to help decrease the risk of diabetes. we will refill metformin for 1 month. Cheryl [name] agreed to follow-up with [name] as directed to closely monitor her progress.  - metFORMIN (GLUCOPHAGE) 500 MG  tablet; TAKE 1 TABLET(500 MG) BY MOUTH TWICE DAILY WITH A MEAL  Dispense: 60 tablet; Refill: 0  3. Osteoarthritis of both knees, unspecified osteoarthritis type Cheryl [name] will continue Mobic, and we will refill for 1 month.  - meloxicam (MOBIC) 15 MG tablet; Take 1 tablet (15 mg total) by mouth at bedtime.  Dispense: 30 tablet; Refill: 0  4. At risk for hyperglycemia Cheryl [name] was given approximately 15 minutes of counseling today regarding prevention of hyperglycemia. She was advised of hyperglycemia causes and the fact hyperglycemia is often asymptomatic. Cheryl [name] was instructed to avoid skipping meals, eat regular protein rich meals and schedule low calorie but protein rich snacks as needed.   Repetitive spaced learning was employed today to elicit superior memory formation and behavioral change  5. Class 2 severe obesity with serious comorbidity and body mass index (BMI) of 36.0 to 36.9 in adult, unspecified obesity type [facility]) Cheryl [name] is currently in the action stage of change. As such, her goal is to continue with weight loss efforts. She has agreed to keeping a food journal and adhering to recommended goals of 1200 calories and 70+ grams of protein daily.   Exercise goals: As is.  Behavioral modification strategies: celebration eating strategies.  Cheryl [name] has agreed to follow-up with our clinic in 4 weeks. She was informed of the importance of frequent follow-up visits to maximize her success with intensive lifestyle modifications for her multiple health conditions.   Objective:   Blood pressure 128/80, pulse 63, temperature 97.8 F (36.6 C), temperature source Oral, height 5\' 6"  (1.676 m), weight 227 lb (103 kg), SpO2 97 %.  Body mass index is 36.64 kg/m.  General: Cooperative, alert, well developed, in no acute distress. HEENT: Conjunctivae and lids unremarkable. Cardiovascular: Regular rhythm.  Lungs: Normal work of breathing. Neurologic: No focal deficits.   Lab Results  Component  Value Date   CREATININE 0.81 12/15/2019   BUN 24 12/15/2019   NA 142 12/15/2019   K 4.8 12/15/2019   CL 106 12/15/2019   CO2 21 12/15/2019   Lab Results  Component Value Date   ALT 12 12/15/2019   AST 18 12/15/2019   ALKPHOS 97 12/15/2019   BILITOT 0.3 12/15/2019   Lab Results  Component Value Date   HGBA1C 5.4 12/15/2019   HGBA1C 5.5 07/15/2019   HGBA1C 5.3 03/18/2019   HGBA1C 5.4 09/18/2018   HGBA1C 5.5 05/28/2018   Lab Results  Component Value Date   INSULIN 5.0 12/15/2019   INSULIN 8.0 07/15/2019   INSULIN 13.0 03/18/2019   INSULIN 4.5 09/18/2018   INSULIN 6.2 05/28/2018   Lab Results  Component Value Date   TSH 5.430 (H) 12/15/2019   Lab Results  Component Value Date   CHOL 217 (H) 12/15/2019   HDL 74 12/15/2019   LDLCALC 128 (H) 12/15/2019   TRIG 85 12/15/2019   CHOLHDL 3.1 09/27/2017   Lab Results  Component Value Date   WBC 5.9 03/18/2019   HGB 13.6 03/18/2019   HCT 41.6 03/18/2019   MCV 91 03/18/2019   PLT 263 06/08/2016   No results found for: IRON, TIBC, FERRITIN  Attestation Statements:   Reviewed by clinician on day of visit: allergies, medications, problem list, medical history, surgical history, family history, social history, and previous encounter notes.   I, Trixie Dredge, am acting as transcriptionist for Dennard Nip, MD.  I have reviewed the above documentation for accuracy and completeness, and I agree with the above. -  Dennard Nip, MD

## 2020-02-29 ENCOUNTER — Other Ambulatory Visit (INDEPENDENT_AMBULATORY_CARE_PROVIDER_SITE_OTHER): Payer: Self-pay | Admitting: Family Medicine

## 2020-02-29 DIAGNOSIS — E8881 Metabolic syndrome: Secondary | ICD-10-CM

## 2020-02-29 DIAGNOSIS — M170 Bilateral primary osteoarthritis of knee: Secondary | ICD-10-CM

## 2020-03-01 ENCOUNTER — Other Ambulatory Visit (INDEPENDENT_AMBULATORY_CARE_PROVIDER_SITE_OTHER): Payer: Self-pay | Admitting: Family Medicine

## 2020-03-01 ENCOUNTER — Ambulatory Visit (INDEPENDENT_AMBULATORY_CARE_PROVIDER_SITE_OTHER): Payer: Federal, State, Local not specified - PPO | Admitting: Family Medicine

## 2020-03-01 ENCOUNTER — Encounter (INDEPENDENT_AMBULATORY_CARE_PROVIDER_SITE_OTHER): Payer: Self-pay | Admitting: Family Medicine

## 2020-03-01 ENCOUNTER — Other Ambulatory Visit: Payer: Self-pay

## 2020-03-01 VITALS — BP 127/74 | HR 74 | Temp 97.7°F | Ht 66.0 in | Wt 227.0 lb

## 2020-03-01 DIAGNOSIS — M158 Other polyosteoarthritis: Secondary | ICD-10-CM

## 2020-03-01 DIAGNOSIS — Z6836 Body mass index (BMI) 36.0-36.9, adult: Secondary | ICD-10-CM

## 2020-03-01 DIAGNOSIS — Z9189 Other specified personal risk factors, not elsewhere classified: Secondary | ICD-10-CM | POA: Diagnosis not present

## 2020-03-01 DIAGNOSIS — R7303 Prediabetes: Secondary | ICD-10-CM

## 2020-03-01 DIAGNOSIS — I10 Essential (primary) hypertension: Secondary | ICD-10-CM | POA: Diagnosis not present

## 2020-03-01 MED ORDER — LOSARTAN POTASSIUM 100 MG PO TABS: ORAL_TABLET | ORAL | 0 refills | Status: DC

## 2020-03-01 MED ORDER — METFORMIN HCL 500 MG PO TABS: ORAL_TABLET | ORAL | 0 refills | Status: DC

## 2020-03-01 MED ORDER — MELOXICAM 15 MG PO TABS: 15.0000 mg | ORAL_TABLET | Freq: Every day | ORAL | 0 refills | Status: DC

## 2020-03-03 NOTE — Progress Notes (Signed)
Chief Complaint:   OBESITY Cheryl [name] is here to discuss her progress with her obesity treatment plan along with follow-up of her obesity related diagnoses. Cheryl [name] is on keeping a food journal and adhering to recommended goals of 1200 calories and 70+ grams of protein daily and states she is following her eating plan approximately 35% of the time. Cheryl [name] states she is walking for 45 minutes 4 times per week.  Today's visit was #: 11 Starting weight: 281 lbs Starting date: 02/06/2018 Today's weight: 227 lbs Today's date: 03/01/2020 Total lbs lost to date: 54 Total lbs lost since last in-office visit: 0  Interim History: Cheryl [name] has done well maintaining her weight loss. She has increased walking and averaging approximately 7,000 steps daily.  Subjective:   1. Pre-diabetes Cheryl [name] is stable on metformin, and she denies nausea, vomiting, or hypoglycemia.  2. Essential hypertension Cheryl [name]'s blood pressure is well controlled on her medications and weight loss.  3. Other osteoarthritis involving multiple joints Cheryl [name] is stable on Mobic, and takes with food at night and feels it is helping.  4. At risk for diabetes mellitus Cheryl [name] is at higher than average risk for developing diabetes due to her obesity.   Assessment/Plan:   1. Pre-diabetes Cheryl [name] will continue to work on weight loss, exercise, and decreasing simple carbohydrates to help decrease the risk of diabetes. We will refill metformin for 1 month.  - metFORMIN (GLUCOPHAGE) 500 MG tablet; TAKE 1 TABLET(500 MG) BY MOUTH TWICE DAILY WITH A MEAL  Dispense: 60 tablet; Refill: 0  2. Essential hypertension Cheryl [name] is working on healthy weight loss and exercise to improve blood pressure control. We will watch for signs of hypotension as she continues her lifestyle modifications. We will refill losartan for 1 month.  - losartan (COZAAR) 100 MG tablet; TAKE 1 TABLET(100 MG) BY MOUTH DAILY  Dispense: 30 tablet; Refill: 0  3. Other  osteoarthritis involving multiple joints We will refill Mobic for 1 month, and Cheryl [name] will follow up as directed.  - meloxicam (MOBIC) 15 MG tablet; Take 1 tablet (15 mg total) by mouth at bedtime.  Dispense: 30 tablet; Refill: 0  4. At risk for diabetes mellitus Cheryl [name] was given approximately 15 minutes of diabetes education and counseling today. We discussed intensive lifestyle modifications today with an emphasis on weight loss as well as increasing exercise and decreasing simple carbohydrates in her diet. We also reviewed medication options with an emphasis on risk versus benefit of those discussed.   Repetitive spaced learning was employed today to elicit superior memory formation and behavioral change.  5. Class 2 severe obesity with serious comorbidity and body mass index (BMI) of 36.0 to 36.9 in adult, unspecified obesity type Cheryl [name]) Cheryl [name] is currently in the action stage of change. As such, her goal is to continue with weight loss efforts. She has agreed to keeping a food journal and adhering to recommended goals of 1200 calories and 70+ grams of protein daily.   Exercise goals: As is.  Behavioral modification strategies: increasing lean protein intake and increasing water intake.  Cheryl [name] has agreed to follow-up with our clinic in 3 weeks. She was informed of the importance of frequent follow-up visits to maximize her success with intensive lifestyle modifications for her multiple health conditions.   Objective:   Blood pressure 127/74, pulse 74, temperature 97.7 F (36.5 C), temperature source Oral, height 5\' 6"  (1.676 m), weight 227 lb (103 kg), SpO2 97 %. Body mass index is 36.64 kg/m.  General:  Cooperative, alert, well developed, in no acute distress. HEENT: Conjunctivae and lids unremarkable. Cardiovascular: Regular rhythm.  Lungs: Normal work of breathing. Neurologic: No focal deficits.   Lab Results  Component Value Date   CREATININE 0.81 12/15/2019   BUN 24  12/15/2019   NA 142 12/15/2019   K 4.8 12/15/2019   CL 106 12/15/2019   CO2 21 12/15/2019   Lab Results  Component Value Date   ALT 12 12/15/2019   AST 18 12/15/2019   ALKPHOS 97 12/15/2019   BILITOT 0.3 12/15/2019   Lab Results  Component Value Date   HGBA1C 5.4 12/15/2019   HGBA1C 5.5 07/15/2019   HGBA1C 5.3 03/18/2019   HGBA1C 5.4 09/18/2018   HGBA1C 5.5 05/28/2018   Lab Results  Component Value Date   INSULIN 5.0 12/15/2019   INSULIN 8.0 07/15/2019   INSULIN 13.0 03/18/2019   INSULIN 4.5 09/18/2018   INSULIN 6.2 05/28/2018   Lab Results  Component Value Date   TSH 5.430 (H) 12/15/2019   Lab Results  Component Value Date   CHOL 217 (H) 12/15/2019   HDL 74 12/15/2019   LDLCALC 128 (H) 12/15/2019   TRIG 85 12/15/2019   CHOLHDL 3.1 09/27/2017   Lab Results  Component Value Date   WBC 5.9 03/18/2019   HGB 13.6 03/18/2019   HCT 41.6 03/18/2019   MCV 91 03/18/2019   PLT 263 06/08/2016   No results found for: IRON, TIBC, FERRITIN  Attestation Statements:   Reviewed by clinician on day of visit: allergies, medications, problem list, medical history, surgical history, family history, social history, and previous encounter notes.   I, Trixie Dredge, am acting as transcriptionist for Dennard Nip, MD.  I have reviewed the above documentation for accuracy and completeness, and I agree with the above. -  Dennard Nip, MD

## 2020-03-10 DIAGNOSIS — H25043 Posterior subcapsular polar age-related cataract, bilateral: Secondary | ICD-10-CM | POA: Diagnosis not present

## 2020-03-10 DIAGNOSIS — E119 Type 2 diabetes mellitus without complications: Secondary | ICD-10-CM | POA: Diagnosis not present

## 2020-03-24 ENCOUNTER — Encounter (INDEPENDENT_AMBULATORY_CARE_PROVIDER_SITE_OTHER): Payer: Self-pay | Admitting: Family Medicine

## 2020-03-24 ENCOUNTER — Ambulatory Visit (INDEPENDENT_AMBULATORY_CARE_PROVIDER_SITE_OTHER): Payer: Federal, State, Local not specified - PPO | Admitting: Family Medicine

## 2020-03-24 ENCOUNTER — Other Ambulatory Visit: Payer: Self-pay

## 2020-03-24 ENCOUNTER — Other Ambulatory Visit (INDEPENDENT_AMBULATORY_CARE_PROVIDER_SITE_OTHER): Payer: Self-pay | Admitting: Family Medicine

## 2020-03-24 VITALS — BP 131/87 | HR 73 | Temp 97.8°F | Ht 66.0 in | Wt 227.0 lb

## 2020-03-24 DIAGNOSIS — Z6836 Body mass index (BMI) 36.0-36.9, adult: Secondary | ICD-10-CM

## 2020-03-24 DIAGNOSIS — I10 Essential (primary) hypertension: Secondary | ICD-10-CM | POA: Diagnosis not present

## 2020-03-24 DIAGNOSIS — M170 Bilateral primary osteoarthritis of knee: Secondary | ICD-10-CM

## 2020-03-24 DIAGNOSIS — Z9189 Other specified personal risk factors, not elsewhere classified: Secondary | ICD-10-CM | POA: Diagnosis not present

## 2020-03-24 DIAGNOSIS — E559 Vitamin D deficiency, unspecified: Secondary | ICD-10-CM

## 2020-03-24 MED ORDER — LOSARTAN POTASSIUM 100 MG PO TABS: ORAL_TABLET | ORAL | 0 refills | Status: DC

## 2020-03-24 MED ORDER — MELOXICAM 15 MG PO TABS: 15.0000 mg | ORAL_TABLET | Freq: Every day | ORAL | 0 refills | Status: DC

## 2020-03-25 NOTE — Progress Notes (Signed)
Chief Complaint:   OBESITY Cheryl [name] is here to discuss her progress with her obesity treatment plan along with follow-up of her obesity related diagnoses. Cheryl [name] is on keeping a food journal and adhering to recommended goals of 1200 calories and 70+ grams of protein daily and states she is following her eating plan approximately 15% of the time. Cheryl [name] states she is walking for 30 minutes 2 times per week.  Today's visit was #: 86 Starting weight: 281 lbs Starting date: 02/06/2018 Today's weight: 227 lbs Today's date: 03/24/2020 Total lbs lost to date: 54 Total lbs lost since last in-office visit: 0  Interim History: Cheryl [name] continues to do well maintaining weight loss. She has a lot of family stress and has done some celebration eating as well, but she was mindful of her food choices. She has worked on increasing her protein since then.  Subjective:   1. Bilateral primary osteoarthritis of knee Cheryl [name] is stable on Mobic, and she feels it helps her osteoarthritis pain. She denies gastritis.   2. Essential hypertension Cheryl [name]'s blood pressure is well controlled on diet prescription. She denies chest pain or lightheadedness.  3. At risk for dehydration Cheryl [name] is at risk for dehydration due to inadequate water intake.  Assessment/Plan:   1. Bilateral primary osteoarthritis of knee We will refill Mobic for 1 month, and Cheryl [name] will follow up as directed.  - meloxicam (MOBIC) 15 MG tablet; Take 1 tablet (15 mg total) by mouth at bedtime.  Dispense: 30 tablet; Refill: 0  2. Essential hypertension Cheryl [name] is working on healthy weight loss, diet, and exercise to improve blood pressure control. We will watch for signs of hypotension as she continues her lifestyle modifications. We will refill losartan for 1 month.  - losartan (COZAAR) 100 MG tablet; TAKE 1 TABLET(100 MG) BY MOUTH DAILY  Dispense: 30 tablet; Refill: 0  3. At risk for dehydration Cheryl [name] was given approximately 15 minutes  dehydration prevention counseling today. Cheryl [name] is at risk for dehydration due to weight loss and current medication(s). She was encouraged to hydrate and monitor fluid status to avoid dehydration as well as weight loss plateaus.   4. Class 2 severe obesity with serious comorbidity and body mass index (BMI) of 36.0 to 36.9 in adult, unspecified obesity type Cheryl [name] is currently in the action stage of change. As such, her goal is to continue with weight loss efforts. She has agreed to keeping a food journal and adhering to recommended goals of 1200 calories and 70+ grams of protein daily.   Exercise goals: As is.  Behavioral modification strategies: increasing lean protein intake.  Cheryl [name] has agreed to follow-up with our clinic in 3 weeks. She was informed of the importance of frequent follow-up visits to maximize her success with intensive lifestyle modifications for her multiple health conditions.   Objective:   Blood pressure 131/87, pulse 73, temperature 97.8 F (36.6 C), temperature source Oral, height 5\' 6"  (1.676 m), weight 227 lb (103 kg), SpO2 100 %. Body mass index is 36.64 kg/m.  General: Cooperative, alert, well developed, in no acute distress. HEENT: Conjunctivae and lids unremarkable. Cardiovascular: Regular rhythm.  Lungs: Normal work of breathing. Neurologic: No focal deficits.   Lab Results  Component Value Date   CREATININE 0.81 12/15/2019   BUN 24 12/15/2019   NA 142 12/15/2019   K 4.8 12/15/2019   CL 106 12/15/2019   CO2 21 12/15/2019   Lab Results  Component Value Date   ALT 12 12/15/2019  AST 18 12/15/2019   ALKPHOS 97 12/15/2019   BILITOT 0.3 12/15/2019   Lab Results  Component Value Date   HGBA1C 5.4 12/15/2019   HGBA1C 5.5 07/15/2019   HGBA1C 5.3 03/18/2019   HGBA1C 5.4 09/18/2018   HGBA1C 5.5 05/28/2018   Lab Results  Component Value Date   INSULIN 5.0 12/15/2019   INSULIN 8.0 07/15/2019   INSULIN 13.0 03/18/2019   INSULIN 4.5  09/18/2018   INSULIN 6.2 05/28/2018   Lab Results  Component Value Date   TSH 5.430 (H) 12/15/2019   Lab Results  Component Value Date   CHOL 217 (H) 12/15/2019   HDL 74 12/15/2019   LDLCALC 128 (H) 12/15/2019   TRIG 85 12/15/2019   CHOLHDL 3.1 09/27/2017   Lab Results  Component Value Date   WBC 5.9 03/18/2019   HGB 13.6 03/18/2019   HCT 41.6 03/18/2019   MCV 91 03/18/2019   PLT 263 06/08/2016   No results found for: IRON, TIBC, FERRITIN  Attestation Statements:   Reviewed by clinician on day of visit: allergies, medications, problem list, medical history, surgical history, family history, social history, and previous encounter notes.   I, Trixie Dredge, am acting as transcriptionist for Dennard Nip, MD.  I have reviewed the above documentation for accuracy and completeness, and I agree with the above. -  Dennard Nip, MD

## 2020-03-29 ENCOUNTER — Other Ambulatory Visit (INDEPENDENT_AMBULATORY_CARE_PROVIDER_SITE_OTHER): Payer: Self-pay | Admitting: Family Medicine

## 2020-03-29 DIAGNOSIS — R7303 Prediabetes: Secondary | ICD-10-CM

## 2020-03-29 DIAGNOSIS — M170 Bilateral primary osteoarthritis of knee: Secondary | ICD-10-CM

## 2020-03-29 DIAGNOSIS — I10 Essential (primary) hypertension: Secondary | ICD-10-CM

## 2020-04-14 ENCOUNTER — Other Ambulatory Visit: Payer: Self-pay

## 2020-04-14 ENCOUNTER — Other Ambulatory Visit (INDEPENDENT_AMBULATORY_CARE_PROVIDER_SITE_OTHER): Payer: Self-pay | Admitting: Family Medicine

## 2020-04-14 ENCOUNTER — Ambulatory Visit (INDEPENDENT_AMBULATORY_CARE_PROVIDER_SITE_OTHER): Payer: Federal, State, Local not specified - PPO | Admitting: Family Medicine

## 2020-04-14 ENCOUNTER — Encounter (INDEPENDENT_AMBULATORY_CARE_PROVIDER_SITE_OTHER): Payer: Self-pay | Admitting: Family Medicine

## 2020-04-14 VITALS — BP 120/83 | HR 67 | Temp 97.5°F | Ht 66.0 in | Wt 225.0 lb

## 2020-04-14 DIAGNOSIS — M170 Bilateral primary osteoarthritis of knee: Secondary | ICD-10-CM | POA: Diagnosis not present

## 2020-04-14 DIAGNOSIS — Z9189 Other specified personal risk factors, not elsewhere classified: Secondary | ICD-10-CM

## 2020-04-14 DIAGNOSIS — R7303 Prediabetes: Secondary | ICD-10-CM | POA: Diagnosis not present

## 2020-04-14 DIAGNOSIS — I10 Essential (primary) hypertension: Secondary | ICD-10-CM | POA: Diagnosis not present

## 2020-04-14 DIAGNOSIS — Z6836 Body mass index (BMI) 36.0-36.9, adult: Secondary | ICD-10-CM

## 2020-04-14 MED ORDER — MELOXICAM 15 MG PO TABS: 15.0000 mg | ORAL_TABLET | Freq: Every day | ORAL | 0 refills | Status: DC

## 2020-04-14 MED ORDER — LOSARTAN POTASSIUM 100 MG PO TABS: ORAL_TABLET | ORAL | 0 refills | Status: DC

## 2020-04-14 MED ORDER — METFORMIN HCL 500 MG PO TABS: ORAL_TABLET | ORAL | 0 refills | Status: DC

## 2020-04-15 NOTE — Progress Notes (Signed)
Chief Complaint:   OBESITY Cheryl [name] is here to discuss her progress with her obesity treatment plan along with follow-up of her obesity related diagnoses. Cheryl [name] is on keeping a food journal and adhering to recommended goals of 1200 calories and 70+ grams of protein daily and states she is following her eating plan approximately 50% of the time. Cheryl [name] states she is walking 7,000 steps daily, and walking for 45 minutes 3 times per week.  Today's visit was #: 41 Starting weight: 281 lbs Starting date: 02/06/2018 Today's weight: 225 lbs Today's date: 04/14/2020 Total lbs lost to date: 56 Total lbs lost since last in-office visit: 2  Interim History: Cheryl [name] continues to do well with weight loss. She is going on vacation soon and she has some strategies to help minimize vacation weight gain.  Subjective:   1. Pre-diabetes Cheryl [name] is stable on metformin, and she is doing well on diet and with weight loss efforts.  2. Bilateral primary osteoarthritis of knee Cheryl [name] is working on diet and weight loss. She notes Mobic helps decrease her pain.  3. Essential hypertension Cheryl [name]'s blood pressure is well controlled on her medications. She denies chest pain or lightheadedness.  4. At risk for dehydration Cheryl [name] is at risk for dehydration due to  Assessment/Plan:   1. Pre-diabetes Cheryl [name] will continue to work on weight loss, diet, exercise, and decreasing simple carbohydrates to help decrease the risk of diabetes. We will refill metformin for 1 month.  - metFORMIN (GLUCOPHAGE) 500 MG tablet; TAKE 1 TABLET(500 MG) BY MOUTH TWICE DAILY WITH A MEAL  Dispense: 60 tablet; Refill: 0  2. Bilateral primary osteoarthritis of knee Cheryl [name] will continue with diet and exercise, and we will refill Mobic for 1 month.  - meloxicam (MOBIC) 15 MG tablet; Take 1 tablet (15 mg total) by mouth at bedtime.  Dispense: 30 tablet; Refill: 0  3. Essential hypertension Cheryl [name] will continue working on healthy  weight loss, diet, and exercise to improve blood pressure control. We will watch for signs of hypotension as she continues her lifestyle modifications. We will refill losartan for 1 month.  - losartan (COZAAR) 100 MG tablet; TAKE 1 TABLET(100 MG) BY MOUTH DAILY  Dispense: 30 tablet; Refill: 0  4. At risk for dehydration Cheryl [name] was given approximately 15 minutes dehydration prevention counseling today. Cheryl [name] is at risk for dehydration due to weight loss and current medication(s). She was encouraged to hydrate and monitor fluid status to avoid dehydration as well as weight loss plateaus.   5. Class 2 severe obesity with serious comorbidity and body mass index (BMI) of 36.0 to 36.9 in adult, unspecified obesity type Cheryl [name]) Cheryl [name] is currently in the action stage of change. As such, her goal is to continue with weight loss efforts. She has agreed to keeping a food journal and adhering to recommended goals of 1200 calories and 70+ grams of protein daily.   Exercise goals: As is.  Behavioral modification strategies: increasing lean protein intake, decreasing simple carbohydrates and holiday eating strategies .  Cheryl [name] has agreed to follow-up with our clinic in 4 weeks. She was informed of the importance of frequent follow-up visits to maximize her success with intensive lifestyle modifications for her multiple health conditions.   Objective:   Blood pressure 120/83, pulse 67, temperature (!) 97.5 F (36.4 C), height 5\' 6"  (1.676 m), weight 225 lb (102.1 kg), SpO2 98 %. Body mass index is 36.32 kg/m.  General: Cooperative, alert, well developed, in no acute distress. HEENT:  Conjunctivae and lids unremarkable. Cardiovascular: Regular rhythm.  Lungs: Normal work of breathing. Neurologic: No focal deficits.   Lab Results  Component Value Date   CREATININE 0.81 12/15/2019   BUN 24 12/15/2019   NA 142 12/15/2019   K 4.8 12/15/2019   CL 106 12/15/2019   CO2 21 12/15/2019   Lab Results    Component Value Date   ALT 12 12/15/2019   AST 18 12/15/2019   ALKPHOS 97 12/15/2019   BILITOT 0.3 12/15/2019   Lab Results  Component Value Date   HGBA1C 5.4 12/15/2019   HGBA1C 5.5 07/15/2019   HGBA1C 5.3 03/18/2019   HGBA1C 5.4 09/18/2018   HGBA1C 5.5 05/28/2018   Lab Results  Component Value Date   INSULIN 5.0 12/15/2019   INSULIN 8.0 07/15/2019   INSULIN 13.0 03/18/2019   INSULIN 4.5 09/18/2018   INSULIN 6.2 05/28/2018   Lab Results  Component Value Date   TSH 5.430 (H) 12/15/2019   Lab Results  Component Value Date   CHOL 217 (H) 12/15/2019   HDL 74 12/15/2019   LDLCALC 128 (H) 12/15/2019   TRIG 85 12/15/2019   CHOLHDL 3.1 09/27/2017   Lab Results  Component Value Date   WBC 5.9 03/18/2019   HGB 13.6 03/18/2019   HCT 41.6 03/18/2019   MCV 91 03/18/2019   PLT 263 06/08/2016   No results found for: IRON, TIBC, FERRITIN  Attestation Statements:   Reviewed by clinician on day of visit: allergies, medications, problem list, medical history, surgical history, family history, social history, and previous encounter notes.   I, Trixie Dredge, am acting as transcriptionist for Dennard Nip, MD.  I have reviewed the above documentation for accuracy and completeness, and I agree with the above. -  Dennard Nip, MD

## 2020-04-16 ENCOUNTER — Other Ambulatory Visit (INDEPENDENT_AMBULATORY_CARE_PROVIDER_SITE_OTHER): Payer: Self-pay | Admitting: Family Medicine

## 2020-04-16 DIAGNOSIS — M170 Bilateral primary osteoarthritis of knee: Secondary | ICD-10-CM

## 2020-04-16 DIAGNOSIS — I10 Essential (primary) hypertension: Secondary | ICD-10-CM

## 2020-04-29 ENCOUNTER — Other Ambulatory Visit: Payer: Self-pay | Admitting: Obstetrics & Gynecology

## 2020-04-29 DIAGNOSIS — I10 Essential (primary) hypertension: Secondary | ICD-10-CM

## 2020-04-29 NOTE — Telephone Encounter (Signed)
Medication refill request: Venlafaxine Last AEX:  04/28/19 Dr. Sabra Heck Next AEX: will call to schedule Last MMG (if hormonal medication request): n/a Refill authorized: today, please advise

## 2020-05-12 ENCOUNTER — Encounter (INDEPENDENT_AMBULATORY_CARE_PROVIDER_SITE_OTHER): Payer: Self-pay | Admitting: Family Medicine

## 2020-05-12 ENCOUNTER — Other Ambulatory Visit: Payer: Self-pay

## 2020-05-12 ENCOUNTER — Other Ambulatory Visit (INDEPENDENT_AMBULATORY_CARE_PROVIDER_SITE_OTHER): Payer: Self-pay | Admitting: Family Medicine

## 2020-05-12 ENCOUNTER — Ambulatory Visit (INDEPENDENT_AMBULATORY_CARE_PROVIDER_SITE_OTHER): Payer: Federal, State, Local not specified - PPO | Admitting: Family Medicine

## 2020-05-12 VITALS — BP 113/77 | HR 86 | Temp 98.1°F | Ht 66.0 in | Wt 225.0 lb

## 2020-05-12 DIAGNOSIS — M170 Bilateral primary osteoarthritis of knee: Secondary | ICD-10-CM | POA: Diagnosis not present

## 2020-05-12 DIAGNOSIS — I10 Essential (primary) hypertension: Secondary | ICD-10-CM

## 2020-05-12 DIAGNOSIS — Z6836 Body mass index (BMI) 36.0-36.9, adult: Secondary | ICD-10-CM

## 2020-05-12 DIAGNOSIS — Z9189 Other specified personal risk factors, not elsewhere classified: Secondary | ICD-10-CM

## 2020-05-12 DIAGNOSIS — E559 Vitamin D deficiency, unspecified: Secondary | ICD-10-CM | POA: Diagnosis not present

## 2020-05-12 MED ORDER — MELOXICAM 15 MG PO TABS: 15.0000 mg | ORAL_TABLET | Freq: Every day | ORAL | 0 refills | Status: DC

## 2020-05-12 MED ORDER — LOSARTAN POTASSIUM 100 MG PO TABS: ORAL_TABLET | ORAL | 0 refills | Status: DC

## 2020-05-12 NOTE — Progress Notes (Signed)
Chief Complaint:   OBESITY Cheryl [name] is here to discuss her progress with her obesity treatment plan along with follow-up of her obesity related diagnoses. Cheryl [name] is on keeping a food journal and adhering to recommended goals of 1200 calories and 70+ grams of protein daily and states she is following her eating plan approximately 35% of the time. Cheryl [name] states she is walking 7,000 steps, and walking for 45 minutes 7 times per week.  Today's visit was #: 9 Starting weight: 281 lbs Starting date: 02/09/2018 Today's weight: 225 lbs Today's date: 05/12/2020 Total lbs lost to date: 56 Total lbs lost since last in-office visit: 0  Interim History: Cheryl [name] continues to d well maintaining her weight. 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 She notes meal planning has been difficult.  Subjective:   1. Essential hypertension Cheryl [name]'s blood pressure is weel controlled on her medications, and she denies nausea or vomiting.  2. Bilateral primary osteoarthritis of knee Cheryl [name] is stable on Mobic, and she feels it is helping. She continues to work on weight loss and her anti-inflammatory diet.  3. At risk for impaired metabolic function Cheryl [name] is at increased risk for impaired metabolic function due to decreased protein.  Assessment/Plan:   1. Essential hypertension Cheryl [name] is working on healthy weight loss and exercise to improve blood pressure control. We will watch for signs of hypotension as she continues her lifestyle modifications. We will refill losartan for 1 month.  - losartan (COZAAR) 100 MG tablet; TAKE 1 TABLET(100 MG) BY MOUTH DAILY  Dispense: 30 tablet; Refill: 0  2. Bilateral primary osteoarthritis of knee We will refill Mobic for 1 month, and Cheryl [name] will continue to follow up as directed.  - meloxicam (MOBIC) 15 MG tablet; Take 1 tablet (15 mg total) by mouth at bedtime.  Dispense: 30 tablet; Refill: 0  3. At risk for impaired metabolic  function Cheryl [name] was given approximately 15 minutes of impaired  metabolic function prevention counseling today. We discussed intensive lifestyle modifications today with an emphasis on specific nutrition and exercise instructions and strategies.   Repetitive spaced learning was employed today to elicit superior memory formation and behavioral change.  4. Class 2 severe obesity with serious comorbidity and body mass index (BMI) of 36.0 to 36.9 in adult, unspecified obesity type [facility]) Cheryl [name] is currently in the action stage of change. As such, her goal is to continue with weight loss efforts. She has agreed to keeping a food journal and adhering to recommended goals of 1200 calories and 70+ grams of protein daily.   We will recheck fasting labs at her next visit.  Exercise goals: As is.  Behavioral modification strategies: meal planning and cooking strategies.  Cheryl [name] has agreed to follow-up with our clinic in 3 weeks. She was informed of the importance of frequent follow-up visits to maximize her success with intensive lifestyle modifications for her multiple health conditions.   Objective:   Blood pressure 113/77, pulse 86, temperature 98.1 F (36.7 C), height 5\' 6"  (1.676 m), weight 225 lb (102.1 kg), SpO2 96 %. Body mass index is 36.32 kg/m.  General: Cooperative, alert, well developed, in no acute distress. HEENT: Conjunctivae and lids unremarkable. Cardiovascular: Regular rhythm.  Lungs: Normal work of breathing. Neurologic: No focal deficits.   Lab Results  Component Value Date   CREATININE 0.81 12/15/2019   BUN 24 12/15/2019   NA 142 12/15/2019   K 4.8 12/15/2019   CL  106 12/15/2019   CO2 21 12/15/2019   Lab Results  Component Value Date   ALT 12 12/15/2019   AST 18 12/15/2019   ALKPHOS 97 12/15/2019   BILITOT 0.3 12/15/2019   Lab Results  Component Value Date   HGBA1C 5.4 12/15/2019   HGBA1C 5.5 07/15/2019   HGBA1C 5.3 03/18/2019   HGBA1C 5.4 09/18/2018    HGBA1C 5.5 05/28/2018   Lab Results  Component Value Date   INSULIN 5.0 12/15/2019   INSULIN 8.0 07/15/2019   INSULIN 13.0 03/18/2019   INSULIN 4.5 09/18/2018   INSULIN 6.2 05/28/2018   Lab Results  Component Value Date   TSH 5.430 (H) 12/15/2019   Lab Results  Component Value Date   CHOL 217 (H) 12/15/2019   HDL 74 12/15/2019   LDLCALC 128 (H) 12/15/2019   TRIG 85 12/15/2019   CHOLHDL 3.1 09/27/2017   Lab Results  Component Value Date   WBC 5.9 03/18/2019   HGB 13.6 03/18/2019   HCT 41.6 03/18/2019   MCV 91 03/18/2019   PLT 263 06/08/2016   No results found for: IRON, TIBC, FERRITIN  Attestation Statements:   Reviewed by clinician on day of visit: allergies, medications, problem list, medical history, surgical history, family history, social history, and previous encounter notes.   I, Trixie Dredge, am acting as transcriptionist for Dennard Nip, MD.  I have reviewed the above documentation for accuracy and completeness, and I agree with the above. -  Dennard Nip, MD

## 2020-05-20 ENCOUNTER — Other Ambulatory Visit: Payer: Self-pay | Admitting: Obstetrics & Gynecology

## 2020-05-20 ENCOUNTER — Encounter (INDEPENDENT_AMBULATORY_CARE_PROVIDER_SITE_OTHER): Payer: Self-pay

## 2020-05-20 ENCOUNTER — Other Ambulatory Visit (INDEPENDENT_AMBULATORY_CARE_PROVIDER_SITE_OTHER): Payer: Self-pay | Admitting: Family Medicine

## 2020-05-20 DIAGNOSIS — R7303 Prediabetes: Secondary | ICD-10-CM

## 2020-05-20 DIAGNOSIS — M170 Bilateral primary osteoarthritis of knee: Secondary | ICD-10-CM

## 2020-05-20 NOTE — Telephone Encounter (Signed)
Message sent to pt-[facility] 

## 2020-05-28 DIAGNOSIS — L578 Other skin changes due to chronic exposure to nonionizing radiation: Secondary | ICD-10-CM | POA: Diagnosis not present

## 2020-05-28 DIAGNOSIS — D225 Melanocytic nevi of trunk: Secondary | ICD-10-CM | POA: Diagnosis not present

## 2020-05-28 DIAGNOSIS — D2261 Melanocytic nevi of right upper limb, including shoulder: Secondary | ICD-10-CM | POA: Diagnosis not present

## 2020-05-28 DIAGNOSIS — L280 Lichen simplex chronicus: Secondary | ICD-10-CM | POA: Diagnosis not present

## 2020-05-28 DIAGNOSIS — L821 Other seborrheic keratosis: Secondary | ICD-10-CM | POA: Diagnosis not present

## 2020-05-28 DIAGNOSIS — D485 Neoplasm of uncertain behavior of skin: Secondary | ICD-10-CM | POA: Diagnosis not present

## 2020-05-28 DIAGNOSIS — B353 Tinea pedis: Secondary | ICD-10-CM | POA: Diagnosis not present

## 2020-06-10 ENCOUNTER — Other Ambulatory Visit (INDEPENDENT_AMBULATORY_CARE_PROVIDER_SITE_OTHER): Payer: Self-pay | Admitting: Family Medicine

## 2020-06-10 ENCOUNTER — Ambulatory Visit (INDEPENDENT_AMBULATORY_CARE_PROVIDER_SITE_OTHER): Payer: Federal, State, Local not specified - PPO | Admitting: Family Medicine

## 2020-06-10 ENCOUNTER — Encounter (INDEPENDENT_AMBULATORY_CARE_PROVIDER_SITE_OTHER): Payer: Self-pay | Admitting: Family Medicine

## 2020-06-10 ENCOUNTER — Other Ambulatory Visit: Payer: Self-pay

## 2020-06-10 VITALS — BP 139/85 | HR 66 | Temp 97.8°F | Ht 66.0 in | Wt 232.0 lb

## 2020-06-10 DIAGNOSIS — E7849 Other hyperlipidemia: Secondary | ICD-10-CM | POA: Diagnosis not present

## 2020-06-10 DIAGNOSIS — R7303 Prediabetes: Secondary | ICD-10-CM

## 2020-06-10 DIAGNOSIS — E66812 Obesity, class 2: Secondary | ICD-10-CM

## 2020-06-10 DIAGNOSIS — E559 Vitamin D deficiency, unspecified: Secondary | ICD-10-CM | POA: Diagnosis not present

## 2020-06-10 DIAGNOSIS — R7989 Other specified abnormal findings of blood chemistry: Secondary | ICD-10-CM

## 2020-06-10 DIAGNOSIS — M1990 Unspecified osteoarthritis, unspecified site: Secondary | ICD-10-CM

## 2020-06-10 DIAGNOSIS — F3289 Other specified depressive episodes: Secondary | ICD-10-CM

## 2020-06-10 DIAGNOSIS — I10 Essential (primary) hypertension: Secondary | ICD-10-CM

## 2020-06-10 DIAGNOSIS — E782 Mixed hyperlipidemia: Secondary | ICD-10-CM | POA: Diagnosis not present

## 2020-06-10 DIAGNOSIS — Z6837 Body mass index (BMI) 37.0-37.9, adult: Secondary | ICD-10-CM

## 2020-06-10 MED ORDER — MELOXICAM 15 MG PO TABS: 15.0000 mg | ORAL_TABLET | Freq: Every day | ORAL | 0 refills | Status: DC

## 2020-06-10 MED ORDER — BUPROPION HCL ER (SR) 150 MG PO TB12: 150.0000 mg | ORAL_TABLET | Freq: Every morning | ORAL | 0 refills | Status: DC

## 2020-06-10 MED ORDER — METFORMIN HCL 500 MG PO TABS: ORAL_TABLET | ORAL | 0 refills | Status: DC

## 2020-06-10 MED ORDER — LOSARTAN POTASSIUM 100 MG PO TABS: ORAL_TABLET | ORAL | 0 refills | Status: DC

## 2020-06-10 NOTE — Telephone Encounter (Signed)
Dr Beasley pt 

## 2020-06-11 LAB — COMPREHENSIVE METABOLIC PANEL
ALT: 14 IU/L (ref 0–32)
AST: 17 IU/L (ref 0–40)
Albumin/Globulin Ratio: 2 (ref 1.2–2.2)
Albumin: 4.2 g/dL (ref 3.8–4.8)
Alkaline Phosphatase: 87 IU/L (ref 44–121)
BUN/Creatinine Ratio: 33 — ABNORMAL HIGH (ref 12–28)
BUN: 23 mg/dL (ref 8–27)
Bilirubin Total: 0.4 mg/dL (ref 0.0–1.2)
CO2: 24 mmol/L (ref 20–29)
Calcium: 9.1 mg/dL (ref 8.7–10.3)
Chloride: 106 mmol/L (ref 96–106)
Creatinine, Ser: 0.7 mg/dL (ref 0.57–1.00)
GFR calc Af Amer: 106 mL/min/{1.73_m2} (ref >59)
GFR calc non Af Amer: 92 mL/min/{1.73_m2} (ref >59)
Globulin, Total: 2.1 g/dL (ref 1.5–4.5)
Glucose: 80 mg/dL (ref 65–99)
Potassium: 4.7 mmol/L (ref 3.5–5.2)
Sodium: 142 mmol/L (ref 134–144)
Total Protein: 6.3 g/dL (ref 6.0–8.5)

## 2020-06-11 LAB — LIPID PANEL WITH LDL/HDL RATIO
Cholesterol, Total: 222 mg/dL — ABNORMAL HIGH (ref 100–199)
HDL: 72 mg/dL (ref >39)
LDL Chol Calc (NIH): 139 mg/dL — ABNORMAL HIGH (ref 0–99)
LDL/HDL Ratio: 1.9 ratio (ref 0.0–3.2)
Triglycerides: 66 mg/dL (ref 0–149)
VLDL Cholesterol Cal: 11 mg/dL (ref 5–40)

## 2020-06-11 LAB — HEMOGLOBIN A1C
Est. average glucose Bld gHb Est-mCnc: 103 mg/dL
Hgb A1c MFr Bld: 5.2 % (ref 4.8–5.6)

## 2020-06-11 LAB — INSULIN, RANDOM: INSULIN: 5.4 u[IU]/mL (ref 2.6–24.9)

## 2020-06-11 LAB — T3: T3, Total: 88 ng/dL (ref 71–180)

## 2020-06-11 LAB — TSH: TSH: 2.98 u[IU]/mL (ref 0.450–4.500)

## 2020-06-11 LAB — VITAMIN D 25 HYDROXY (VIT D DEFICIENCY, FRACTURES): Vit D, 25-Hydroxy: 54.1 ng/mL (ref 30.0–100.0)

## 2020-06-11 LAB — T4, FREE: Free T4: 0.9 ng/dL (ref 0.82–1.77)

## 2020-06-12 ENCOUNTER — Other Ambulatory Visit (INDEPENDENT_AMBULATORY_CARE_PROVIDER_SITE_OTHER): Payer: Self-pay | Admitting: Family Medicine

## 2020-06-12 DIAGNOSIS — F3289 Other specified depressive episodes: Secondary | ICD-10-CM

## 2020-06-14 NOTE — Telephone Encounter (Signed)
Last OV with Dr. Beasley 

## 2020-06-14 NOTE — Progress Notes (Signed)
Chief Complaint:   OBESITY Cheryl [name] is here to discuss her progress with her obesity treatment plan along with follow-up of her obesity related diagnoses. Cheryl [name] is on keeping a food journal and adhering to recommended goals of 1200 calories and 70+ grams of protein daily and states she is following her eating plan approximately 35% of the time. Cheryl [name] states she is walking 7,000 steps daily.   Today's visit was #: 42 Starting weight: 281 lbs Starting date: 02/09/2018 Today's weight: 232 lbs Today's date: 06/10/2020 Total lbs lost to date: 49 Total lbs lost since last in-office visit: 0  Interim History: Cheryl [name] has struggled with increased stress at home and at work. She hasn't been able to concentrate on diet and weight loss, and she has gained some weight back.  Subjective:   1. Other hyperlipidemia Cheryl [name] is due to have labs checked. She hasn't done as well with her diet recently.   2. Pre-diabetes Cheryl [name] is tolerating metformin, and she denies nausea or vomiting. She is due for labs.  3. Vitamin D deficiency Cheryl [name] is due to have Vit D labs checked.  4. Elevated TSH Cheryl [name] has a history of mildly elevated TSH, and normal T3 and T4. She is due to have labs checked.  5. Essential hypertension Cheryl [name]'s blood pressure is borderline elevated today. She is on losartan, and she is due for labs. She is struggling more with weight loss recently.  6. Osteoarthritis, unspecified osteoarthritis type, unspecified site Cheryl [name]'s pain is stable on Mobic. She requests a refill today.  7. Other depression Cheryl [name] continues to struggle with her mood, especially with increased stress recently.  Assessment/Plan:   1. Other hyperlipidemia Cardiovascular risk and specific lipid/LDL goals reviewed. We discussed several lifestyle modifications today. Cheryl [name] will continue to work on diet, exercise and weight loss efforts. We will check labs today. Orders and follow up as documented in patient  record.   - Lipid Panel With LDL/HDL Ratio  2. Pre-diabetes Cheryl [name] will continue to work on weight loss, diet, exercise, and decreasing simple carbohydrates to help decrease the risk of diabetes. We will check labs today, and we will refill metformin for 1 month.    - Insulin, random - Hemoglobin A1c  - metFORMIN (GLUCOPHAGE) 500 MG tablet; TAKE 1 TABLET(500 MG) BY MOUTH TWICE DAILY WITH A MEAL  Dispense: 60 tablet; Refill: 0  3. Vitamin D deficiency Low Vitamin D level contributes to fatigue and are associated with obesity, breast, and colon cancer. We will check labs today. Cheryl [name] will follow-up for routine testing of Vitamin D, at least 2-3 times per year to avoid over-replacement.  - VITAMIN D 25 Hydroxy (Vit-D Deficiency, Fractures)  4. Elevated TSH We will check labs today, and Cheryl [name] will follow up as directed.  - TSH - T4, free - T3  5. Essential hypertension Cheryl [name] is working on healthy weight loss and exercise to improve blood pressure control. We will watch for signs of hypotension as she continues her lifestyle modifications. We will check labs today, and we will refill losartan for 1 month.  - Comprehensive metabolic panel - losartan (COZAAR) 100 MG tablet; TAKE 1 TABLET(100 MG) BY MOUTH DAILY  Dispense: 30 tablet; Refill: 0  6. Osteoarthritis, unspecified osteoarthritis type, unspecified site We will refill Mobic, and Cheryl [name] will follow up as directed.  - meloxicam (MOBIC) 15 MG tablet; Take 1 tablet (15 mg total) by mouth at bedtime.  Dispense: 30 tablet; Refill: 0  7. Other depression Behavior modification techniques  were discussed today to help Cheryl [name] deal with her emotional/non-hunger eating behaviors. Cheryl [name] will continue Effexor, and start Wellbutrin SR 150 mg q AM with no refills. Orders and follow up as documented in patient record.   - buPROPion (WELLBUTRIN SR) 150 MG 12 hr tablet; Take 1 tablet (150 mg total) by mouth in the morning.  Dispense: 30 tablet;  Refill: 0  8. Class 2 severe obesity with serious comorbidity and body mass index (BMI) of 37.0 to 37.9 in adult, unspecified obesity type Cheryl [name] is currently in the action stage of change. As such, her goal is to continue with weight loss efforts. She has agreed to keeping a food journal and adhering to recommended goals of 1200 calories and 70 grams of protein daily.   Exercise goals: As is.  Behavioral modification strategies: increasing lean protein intake, emotional eating strategies and keeping a strict food journal.  Cheryl [name] has agreed to follow-up with our clinic in 3 weeks. She was informed of the importance of frequent follow-up visits to maximize her success with intensive lifestyle modifications for her multiple health conditions.   Cheryl [name] was informed we would discuss her lab results at her next visit unless there is a critical issue that needs to be addressed sooner. Cheryl [name] agreed to keep her next visit at the agreed upon time to discuss these results.  Objective:   Blood pressure 139/85, pulse 66, temperature 97.8 F (36.6 C), height 5\' 6"  (1.676 m), weight 232 lb (105.2 kg), SpO2 96 %. Body mass index is 37.45 kg/m.  General: Cooperative, alert, well developed, in no acute distress. HEENT: Conjunctivae and lids unremarkable. Cardiovascular: Regular rhythm.  Lungs: Normal work of breathing. Neurologic: No focal deficits.   Lab Results  Component Value Date   CREATININE 0.70 06/10/2020   BUN 23 06/10/2020   NA 142 06/10/2020   K 4.7 06/10/2020   CL 106 06/10/2020   CO2 24 06/10/2020   Lab Results  Component Value Date   ALT 14 06/10/2020   AST 17 06/10/2020   ALKPHOS 87 06/10/2020   BILITOT 0.4 06/10/2020   Lab Results  Component Value Date   HGBA1C 5.2 06/10/2020   HGBA1C 5.4 12/15/2019   HGBA1C 5.5 07/15/2019   HGBA1C 5.3 03/18/2019   HGBA1C 5.4 09/18/2018   Lab Results  Component Value Date   INSULIN 5.4 06/10/2020   INSULIN 5.0 12/15/2019    INSULIN 8.0 07/15/2019   INSULIN 13.0 03/18/2019   INSULIN 4.5 09/18/2018   Lab Results  Component Value Date   TSH 2.980 06/10/2020   Lab Results  Component Value Date   CHOL 222 (H) 06/10/2020   HDL 72 06/10/2020   LDLCALC 139 (H) 06/10/2020   TRIG 66 06/10/2020   CHOLHDL 3.1 09/27/2017   Lab Results  Component Value Date   WBC 5.9 03/18/2019   HGB 13.6 03/18/2019   HCT 41.6 03/18/2019   MCV 91 03/18/2019   PLT 263 06/08/2016   No results found for: IRON, TIBC, FERRITIN  Attestation Statements:   Reviewed by clinician on day of visit: allergies, medications, problem list, medical history, surgical history, family history, social history, and previous encounter notes.  Time spent on visit including pre-visit chart review and post-visit care and charting was 40 minutes.    I, Trixie Dredge, am acting as transcriptionist for Dennard Nip, MD.  I have reviewed the above documentation for accuracy and completeness, and I agree with the above. -  Dennard Nip, MD

## 2020-06-16 ENCOUNTER — Other Ambulatory Visit (INDEPENDENT_AMBULATORY_CARE_PROVIDER_SITE_OTHER): Payer: Self-pay | Admitting: Family Medicine

## 2020-06-16 DIAGNOSIS — I10 Essential (primary) hypertension: Secondary | ICD-10-CM

## 2020-06-30 ENCOUNTER — Ambulatory Visit: Payer: Self-pay

## 2020-07-01 ENCOUNTER — Encounter (INDEPENDENT_AMBULATORY_CARE_PROVIDER_SITE_OTHER): Payer: Self-pay | Admitting: Family Medicine

## 2020-07-01 ENCOUNTER — Ambulatory Visit (INDEPENDENT_AMBULATORY_CARE_PROVIDER_SITE_OTHER): Payer: Federal, State, Local not specified - PPO | Admitting: Family Medicine

## 2020-07-01 ENCOUNTER — Other Ambulatory Visit: Payer: Self-pay

## 2020-07-01 VITALS — BP 120/84 | HR 83 | Temp 97.8°F | Ht 66.0 in | Wt 226.0 lb

## 2020-07-01 DIAGNOSIS — Z6836 Body mass index (BMI) 36.0-36.9, adult: Secondary | ICD-10-CM | POA: Diagnosis not present

## 2020-07-01 DIAGNOSIS — F3289 Other specified depressive episodes: Secondary | ICD-10-CM

## 2020-07-01 DIAGNOSIS — Z9189 Other specified personal risk factors, not elsewhere classified: Secondary | ICD-10-CM | POA: Diagnosis not present

## 2020-07-01 DIAGNOSIS — I10 Essential (primary) hypertension: Secondary | ICD-10-CM

## 2020-07-01 MED ORDER — LOSARTAN POTASSIUM 100 MG PO TABS: ORAL_TABLET | ORAL | 0 refills | Status: DC

## 2020-07-01 MED ORDER — BUPROPION HCL ER (SR) 150 MG PO TB12: 150.0000 mg | ORAL_TABLET | Freq: Every morning | ORAL | 0 refills | Status: DC

## 2020-07-05 NOTE — Progress Notes (Signed)
Chief Complaint:   OBESITY Cheryl [name] is here to discuss her progress with her obesity treatment plan along with follow-up of her obesity related diagnoses. Cheryl [name] is on keeping a food journal and adhering to recommended goals of 1200 calories and 70 grams of protein daily and states she is following her eating plan approximately 25% of the time. Cheryl [name] states she is walking for 20-25 minutes 5 times per week.  Today's visit was #: 70 Starting weight: 281 lbs Starting date: 02/09/2018 Today's weight: 226 lbs Today's date: 07/01/2020 Total lbs lost to date: 55 Total lbs lost since last in-office visit: 6  Interim History: Cheryl [name] continues to do very well with weight loss. She is expecting a low-key Thanksgiving with minimal challenges or temptations. She is doing well controlling emotional eating.  Subjective:   1. Essential hypertension Cheryl [name]'s blood pressure is stable on Cozaar. There has been no increase in blood pressure with the addition of Wellbutrin.  2. Other depression Cheryl [name] started Wellbutrin and she is doing well with it. She notes decreased fatigue, and she is feeling more productive. She denies worsening insomnia.  3. At risk for dehydration Cheryl [name] is at risk for dehydration due to BUN/creatinine ratio.  Assessment/Plan:   1. Essential hypertension Cheryl [name] is working on healthy weight loss and exercise to improve blood pressure control. We will watch for signs of hypotension as she continues her lifestyle modifications. We will refill Cozaar for 1 month.  - losartan (COZAAR) 100 MG tablet; TAKE 1 TABLET(100 MG) BY MOUTH DAILY  Dispense: 30 tablet; Refill: 0  2. Other depression Behavior modification techniques were discussed today to help Cheryl [name] deal with her emotional/non-hunger eating behaviors. Cheryl [name] will continue Effexor, and we will refill Wellbutrin SR for 1 month. Orders and follow up as documented in patient record.   - buPROPion (WELLBUTRIN SR) 150 MG 12  hr tablet; Take 1 tablet (150 mg total) by mouth in the morning.  Dispense: 30 tablet; Refill: 0  3. At risk for dehydration Cheryl [name] was given approximately 15 minutes dehydration prevention counseling today. Cheryl [name] is at risk for dehydration due to weight loss and current medication(s). She was encouraged to hydrate and monitor fluid status to avoid dehydration as well as weight loss plateaus.   4. Class 2 severe obesity with serious comorbidity and body mass index (BMI) of 36.0 to 36.9 in adult, unspecified obesity type [name]) Cheryl [name] is currently in the action stage of change. As such, her goal is to continue with weight loss efforts. She has agreed to keeping a food journal and adhering to recommended goals of 1200 calories and 70+ grams of protein daily.   Exercise goals: As is.  Behavioral modification strategies: increasing water intake and holiday eating strategies .  Cheryl [name] has agreed to follow-up with our clinic in 3 weeks. She was informed of the importance of frequent follow-up visits to maximize her success with intensive lifestyle modifications for her multiple health conditions.   Objective:   Blood pressure 120/84, pulse 83, temperature 97.8 F (36.6 C), height 5\' 6"  (1.676 m), weight 226 lb (102.5 kg), SpO2 97 %. Body mass index is 36.48 kg/m.  General: Cooperative, alert, well developed, in no acute distress. HEENT: Conjunctivae and lids unremarkable. Cardiovascular: Regular rhythm.  Lungs: Normal work of breathing. Neurologic: No focal deficits.   Lab Results  Component Value Date   CREATININE 0.70 06/10/2020   BUN 23 06/10/2020   NA 142 06/10/2020   K 4.7 06/10/2020   CL  106 06/10/2020   CO2 24 06/10/2020   Lab Results  Component Value Date   ALT 14 06/10/2020   AST 17 06/10/2020   ALKPHOS 87 06/10/2020   BILITOT 0.4 06/10/2020   Lab Results  Component Value Date   HGBA1C 5.2 06/10/2020   HGBA1C 5.4 12/15/2019   HGBA1C 5.5 07/15/2019   HGBA1C 5.3  03/18/2019   HGBA1C 5.4 09/18/2018   Lab Results  Component Value Date   INSULIN 5.4 06/10/2020   INSULIN 5.0 12/15/2019   INSULIN 8.0 07/15/2019   INSULIN 13.0 03/18/2019   INSULIN 4.5 09/18/2018   Lab Results  Component Value Date   TSH 2.980 06/10/2020   Lab Results  Component Value Date   CHOL 222 (H) 06/10/2020   HDL 72 06/10/2020   LDLCALC 139 (H) 06/10/2020   TRIG 66 06/10/2020   CHOLHDL 3.1 09/27/2017   Lab Results  Component Value Date   WBC 5.9 03/18/2019   HGB 13.6 03/18/2019   HCT 41.6 03/18/2019   MCV 91 03/18/2019   PLT 263 06/08/2016   No results found for: IRON, TIBC, FERRITIN  Attestation Statements:   Reviewed by clinician on day of visit: allergies, medications, problem list, medical history, surgical history, family history, social history, and previous encounter notes.   I, Trixie Dredge, am acting as transcriptionist for Dennard Nip, MD.  I have reviewed the above documentation for accuracy and completeness, and I agree with the above. -  Dennard Nip, MD

## 2020-07-09 ENCOUNTER — Other Ambulatory Visit (INDEPENDENT_AMBULATORY_CARE_PROVIDER_SITE_OTHER): Payer: Self-pay | Admitting: Family Medicine

## 2020-07-09 DIAGNOSIS — R7303 Prediabetes: Secondary | ICD-10-CM

## 2020-07-10 ENCOUNTER — Other Ambulatory Visit (INDEPENDENT_AMBULATORY_CARE_PROVIDER_SITE_OTHER): Payer: Self-pay | Admitting: Family Medicine

## 2020-07-10 DIAGNOSIS — M1990 Unspecified osteoarthritis, unspecified site: Secondary | ICD-10-CM

## 2020-07-12 ENCOUNTER — Encounter (INDEPENDENT_AMBULATORY_CARE_PROVIDER_SITE_OTHER): Payer: Self-pay

## 2020-07-12 NOTE — Telephone Encounter (Signed)
MyChart message sent to pt to find out if they have enough medication to get them through until next appt.   

## 2020-07-12 NOTE — Telephone Encounter (Signed)
Last OV with Dr. Beasley 

## 2020-07-14 ENCOUNTER — Other Ambulatory Visit (INDEPENDENT_AMBULATORY_CARE_PROVIDER_SITE_OTHER): Payer: Self-pay | Admitting: Family Medicine

## 2020-07-14 ENCOUNTER — Encounter (INDEPENDENT_AMBULATORY_CARE_PROVIDER_SITE_OTHER): Payer: Self-pay

## 2020-07-14 DIAGNOSIS — F3289 Other specified depressive episodes: Secondary | ICD-10-CM

## 2020-07-14 NOTE — Telephone Encounter (Signed)
Dr Beasley pt 

## 2020-07-21 ENCOUNTER — Other Ambulatory Visit: Payer: Self-pay

## 2020-07-21 ENCOUNTER — Encounter (INDEPENDENT_AMBULATORY_CARE_PROVIDER_SITE_OTHER): Payer: Self-pay | Admitting: Family Medicine

## 2020-07-21 ENCOUNTER — Ambulatory Visit (INDEPENDENT_AMBULATORY_CARE_PROVIDER_SITE_OTHER): Payer: Federal, State, Local not specified - PPO | Admitting: Family Medicine

## 2020-07-21 VITALS — BP 139/87 | HR 87 | Temp 97.8°F | Ht 66.0 in | Wt 227.0 lb

## 2020-07-21 DIAGNOSIS — Z6836 Body mass index (BMI) 36.0-36.9, adult: Secondary | ICD-10-CM

## 2020-07-21 DIAGNOSIS — M1990 Unspecified osteoarthritis, unspecified site: Secondary | ICD-10-CM | POA: Diagnosis not present

## 2020-07-21 DIAGNOSIS — F3289 Other specified depressive episodes: Secondary | ICD-10-CM | POA: Diagnosis not present

## 2020-07-21 DIAGNOSIS — Z9189 Other specified personal risk factors, not elsewhere classified: Secondary | ICD-10-CM

## 2020-07-21 DIAGNOSIS — I10 Essential (primary) hypertension: Secondary | ICD-10-CM | POA: Diagnosis not present

## 2020-07-22 NOTE — Progress Notes (Signed)
Chief Complaint:   OBESITY Cheryl [name] is here to discuss her progress with her obesity treatment plan along with follow-up of her obesity related diagnoses. Cheryl [name] is on keeping a food journal and adhering to recommended goals of 1200 calories and 70+ grams of protein daily and states she is following her eating plan approximately 35% of the time. Cheryl [name] states she is walking 7,000 steps 7 times per week.  Today's visit was #: 58 Starting weight: 281 lbs Starting date: 02/09/2018 Today's weight: 227 lbs Today's date: 07/21/2020 Total lbs lost to date: 54 Total lbs lost since last in-office visit: 0  Interim History: Cheryl [name] has done well minimizing holiday weight gain. She has a lot of stressors with her parents multiple health issues.  Subjective:   1. Essential hypertension Lester's blood pressure is a bit elevated today with stress. She denies chest pain or headache.  2. Osteoarthritis, unspecified osteoarthritis type, unspecified site Cheryl [name] notes improved pain in her knees on Mobic.  3. Other depression with emotional eating Cheryl [name]'s mood is stable on her medications despite increased stress. She requests a refill today.  4. At risk for impaired metabolic function Cheryl [name] is at increased risk for impaired metabolic function due to decrease in protein.  Assessment/Plan:   1. Essential hypertension Cheryl [name] is working on healthy weight loss, diet, and exercise to improve blood pressure control. We will watch for signs of hypotension as she continues her lifestyle modifications. We will refill losartan 100 mg q daily #30 for 1 month.  2. Osteoarthritis, unspecified osteoarthritis type, unspecified site We will continue to monitor. We will refill Mobic 15 mg q daily #30 for 1 month. Cheryl [name] will continue to follow up as directed. Orders and follow up as documented in patient record.  3. Other depression with emotional eating Behavior modification techniques were discussed today to  help Cheryl [name] deal with her emotional/non-hunger eating behaviors. We will refill bupropion 150 mg q AM #30 for 1 month. Orders and follow up as documented in patient record.   4. At risk for impaired metabolic function Cheryl [name] was given approximately 15 minutes of impaired  metabolic function prevention counseling today. We discussed intensive lifestyle modifications today with an emphasis on specific nutrition and exercise instructions and strategies.   Repetitive spaced learning was employed today to elicit superior memory formation and behavioral change.  5. Class 2 severe obesity with serious comorbidity and body mass index (BMI) of 36.0 to 36.9 in adult, unspecified obesity type [name]) Cheryl [name] is currently in the action stage of change. As such, her goal is to continue with weight loss efforts. She has agreed to keeping a food journal and adhering to recommended goals of 1200 calories and 70+ grams of protein daily.   Exercise goals: As is.  Behavioral modification strategies: decreasing simple carbohydrates and meal planning and cooking strategies.  Cheryl [name] has agreed to follow-up with our clinic in 4 weeks. She was informed of the importance of frequent follow-up visits to maximize her success with intensive lifestyle modifications for her multiple health conditions.   Objective:   Blood pressure 139/87, pulse 87, temperature 97.8 F (36.6 C), height 5\' 6"  (1.676 m), weight 227 lb (103 kg), SpO2 97 %. Body mass index is 36.64 kg/m.  General: Cooperative, alert, well developed, in no acute distress. HEENT: Conjunctivae and lids unremarkable. Cardiovascular: Regular rhythm.  Lungs: Normal work of breathing. Neurologic: No focal deficits.   Lab Results  Component Value Date   CREATININE 0.70 06/10/2020  BUN 23 06/10/2020   NA 142 06/10/2020   K 4.7 06/10/2020   CL 106 06/10/2020   CO2 24 06/10/2020   Lab Results  Component Value Date   ALT 14 06/10/2020   AST 17 06/10/2020    ALKPHOS 87 06/10/2020   BILITOT 0.4 06/10/2020   Lab Results  Component Value Date   HGBA1C 5.2 06/10/2020   HGBA1C 5.4 12/15/2019   HGBA1C 5.5 07/15/2019   HGBA1C 5.3 03/18/2019   HGBA1C 5.4 09/18/2018   Lab Results  Component Value Date   INSULIN 5.4 06/10/2020   INSULIN 5.0 12/15/2019   INSULIN 8.0 07/15/2019   INSULIN 13.0 03/18/2019   INSULIN 4.5 09/18/2018   Lab Results  Component Value Date   TSH 2.980 06/10/2020   Lab Results  Component Value Date   CHOL 222 (H) 06/10/2020   HDL 72 06/10/2020   LDLCALC 139 (H) 06/10/2020   TRIG 66 06/10/2020   CHOLHDL 3.1 09/27/2017   Lab Results  Component Value Date   WBC 5.9 03/18/2019   HGB 13.6 03/18/2019   HCT 41.6 03/18/2019   MCV 91 03/18/2019   PLT 263 06/08/2016   No results found for: IRON, TIBC, FERRITIN  Attestation Statements:   Reviewed by clinician on day of visit: allergies, medications, problem list, medical history, surgical history, family history, social history, and previous encounter notes.   I, Trixie Dredge, am acting as transcriptionist for Dennard Nip, MD.  I have reviewed the above documentation for accuracy and completeness, and I agree with the above. -  Dennard Nip, MD

## 2020-08-02 MED ORDER — LOSARTAN POTASSIUM 100 MG PO TABS: ORAL_TABLET | ORAL | 0 refills | Status: DC

## 2020-08-02 MED ORDER — BUPROPION HCL ER (SR) 150 MG PO TB12: 150.0000 mg | ORAL_TABLET | Freq: Every morning | ORAL | 0 refills | Status: DC

## 2020-08-02 MED ORDER — MELOXICAM 15 MG PO TABS: 15.0000 mg | ORAL_TABLET | Freq: Every day | ORAL | 0 refills | Status: DC

## 2020-08-08 ENCOUNTER — Other Ambulatory Visit (INDEPENDENT_AMBULATORY_CARE_PROVIDER_SITE_OTHER): Payer: Self-pay | Admitting: Family Medicine

## 2020-08-08 DIAGNOSIS — M1990 Unspecified osteoarthritis, unspecified site: Secondary | ICD-10-CM

## 2020-08-12 ENCOUNTER — Other Ambulatory Visit (INDEPENDENT_AMBULATORY_CARE_PROVIDER_SITE_OTHER): Payer: Self-pay | Admitting: Family Medicine

## 2020-08-12 DIAGNOSIS — I10 Essential (primary) hypertension: Secondary | ICD-10-CM

## 2020-08-12 DIAGNOSIS — F3289 Other specified depressive episodes: Secondary | ICD-10-CM

## 2020-08-18 ENCOUNTER — Other Ambulatory Visit: Payer: Self-pay

## 2020-08-18 ENCOUNTER — Ambulatory Visit (INDEPENDENT_AMBULATORY_CARE_PROVIDER_SITE_OTHER): Payer: Federal, State, Local not specified - PPO | Admitting: Family Medicine

## 2020-08-18 ENCOUNTER — Encounter (INDEPENDENT_AMBULATORY_CARE_PROVIDER_SITE_OTHER): Payer: Self-pay | Admitting: Family Medicine

## 2020-08-18 VITALS — BP 126/80 | HR 82 | Temp 98.1°F | Ht 66.0 in | Wt 227.0 lb

## 2020-08-18 DIAGNOSIS — M1990 Unspecified osteoarthritis, unspecified site: Secondary | ICD-10-CM

## 2020-08-18 DIAGNOSIS — F3289 Other specified depressive episodes: Secondary | ICD-10-CM | POA: Diagnosis not present

## 2020-08-18 DIAGNOSIS — I10 Essential (primary) hypertension: Secondary | ICD-10-CM | POA: Diagnosis not present

## 2020-08-18 DIAGNOSIS — Z9189 Other specified personal risk factors, not elsewhere classified: Secondary | ICD-10-CM | POA: Diagnosis not present

## 2020-08-18 DIAGNOSIS — Z6836 Body mass index (BMI) 36.0-36.9, adult: Secondary | ICD-10-CM

## 2020-08-18 MED ORDER — MELOXICAM 15 MG PO TABS: 15.0000 mg | ORAL_TABLET | Freq: Every day | ORAL | 0 refills | Status: DC

## 2020-08-18 MED ORDER — LOSARTAN POTASSIUM 100 MG PO TABS
ORAL_TABLET | ORAL | 0 refills | Status: DC
Start: 2020-08-18 — End: 2020-09-15

## 2020-08-18 MED ORDER — BUPROPION HCL ER (SR) 150 MG PO TB12: 150.0000 mg | ORAL_TABLET | Freq: Every morning | ORAL | 0 refills | Status: DC

## 2020-08-19 NOTE — Progress Notes (Signed)
Chief Complaint:   OBESITY Cheryl [name] is here to discuss her progress with her obesity treatment plan along with follow-up of her obesity related diagnoses. Cheryl [name] is on keeping a food journal and adhering to recommended goals of 1200 calories and 70+ grams of protein daily and states she is following her eating plan approximately 25% of the time. Cheryl [name] states she is doing 0 minutes 0 times per week.  Today's visit was #: 45 Starting weight: 281 lbs Starting date: 02/09/2018 Today's weight: 227 lbs Today's date: 08/18/2020 Total lbs lost to date: 54 Total lbs lost since last in-office visit: 0  Interim History: Meigan has done very well avoiding weight gain over the holidays. She had some extra temptations, but she was able to portion control and make smarter choices.  Subjective:   1. Osteoarthritis, unspecified osteoarthritis type, unspecified site Cheryl [name] is stable on Mobic, and she denies any abdominal pain or melena. She continues to work on weight loss.  2. Essential hypertension Lamyiah's blood pressure is well controlled on medications. She denies chest pain or lightheadedness.  3. Other depression with emotional eating Cheryl [name] doing well on medications, and she is mindful of emotional eating and doing well with this even over the holidays.  4. At risk for activity intolerance Cheryl [name] is at risk for exercise intolerance due to decrease in exercise recently.  Assessment/Plan:   1. Osteoarthritis, unspecified osteoarthritis type, unspecified site We will refill Mobic for 1 month, and will continue to monitor. Cheryl [name] will follow up as directed. Orders and follow up as documented in patient record.  - meloxicam (MOBIC) 15 MG tablet; Take 1 tablet (15 mg total) by mouth at bedtime.  Dispense: 30 tablet; Refill: 0  2. Essential hypertension Cheryl [name] is working on healthy weight loss and exercise to improve blood pressure control. We will watch for signs of hypotension as she  continues her lifestyle modifications. We will refill Cozaar for 1 month.  - losartan (COZAAR) 100 MG tablet; TAKE 1 TABLET(100 MG) BY MOUTH DAILY  Dispense: 30 tablet; Refill: 0  3. Other depression with emotional eating Behavior modification techniques were discussed today to help Cheryl [name] deal with her emotional/non-hunger eating behaviors. We will refill Wellbutrin SR for 1 month. Orders and follow up as documented in patient record.   - buPROPion (WELLBUTRIN SR) 150 MG 12 hr tablet; Take 1 tablet (150 mg total) by mouth in the morning.  Dispense: 30 tablet; Refill: 0  4. At risk for activity intolerance Cheryl [name] was given approximately 15 minutes of exercise intolerance counseling today. She is 65 y.o. female and has risk factors exercise intolerance including obesity. We discussed intensive lifestyle modifications today with an emphasis on specific weight loss instructions and strategies. Cheryl [name] will work on increasing exercises after the next visit.  Repetitive spaced learning was employed today to elicit superior memory formation and behavioral change.  5. Class 2 severe obesity with serious comorbidity and body mass index (BMI) of 36.0 to 36.9 in adult, unspecified obesity type [facility]) Cheryl [name] is currently in the action stage of change. As such, her goal is to continue with weight loss efforts. She has agreed to the Category 2 Plan or keeping a food journal and adhering to recommended goals of 1200 calories and 70+ grams of protein daily.   Behavioral modification strategies: increasing lean protein intake and meal planning and cooking strategies.  Cheryl [name] has agreed to follow-up with our clinic in 2 weeks. She was informed of the importance of frequent follow-up  visits to maximize her success with intensive lifestyle modifications for her multiple health conditions.   Objective:   Blood pressure 126/80, pulse 82, temperature 98.1 F (36.7 C), height 5\' 6"  (1.676 m), weight 227 lb (103 kg),  SpO2 96 %. Body mass index is 36.64 kg/m.  General: Cooperative, alert, well developed, in no acute distress. HEENT: Conjunctivae and lids unremarkable. Cardiovascular: Regular rhythm.  Lungs: Normal work of breathing. Neurologic: No focal deficits.   Lab Results  Component Value Date   CREATININE 0.70 06/10/2020   BUN 23 06/10/2020   NA 142 06/10/2020   K 4.7 06/10/2020   CL 106 06/10/2020   CO2 24 06/10/2020   Lab Results  Component Value Date   ALT 14 06/10/2020   AST 17 06/10/2020   ALKPHOS 87 06/10/2020   BILITOT 0.4 06/10/2020   Lab Results  Component Value Date   HGBA1C 5.2 06/10/2020   HGBA1C 5.4 12/15/2019   HGBA1C 5.5 07/15/2019   HGBA1C 5.3 03/18/2019   HGBA1C 5.4 09/18/2018   Lab Results  Component Value Date   INSULIN 5.4 06/10/2020   INSULIN 5.0 12/15/2019   INSULIN 8.0 07/15/2019   INSULIN 13.0 03/18/2019   INSULIN 4.5 09/18/2018   Lab Results  Component Value Date   TSH 2.980 06/10/2020   Lab Results  Component Value Date   CHOL 222 (H) 06/10/2020   HDL 72 06/10/2020   LDLCALC 139 (H) 06/10/2020   TRIG 66 06/10/2020   CHOLHDL 3.1 09/27/2017   Lab Results  Component Value Date   WBC 5.9 03/18/2019   HGB 13.6 03/18/2019   HCT 41.6 03/18/2019   MCV 91 03/18/2019   PLT 263 06/08/2016   No results found for: IRON, TIBC, FERRITIN  Attestation Statements:   Reviewed by clinician on day of visit: allergies, medications, problem list, medical history, surgical history, family history, social history, and previous encounter notes.   I, Trixie Dredge, am acting as transcriptionist for Dennard Nip, MD.  I have reviewed the above documentation for accuracy and completeness, and I agree with the above. -  Dennard Nip, MD

## 2020-09-01 ENCOUNTER — Encounter (INDEPENDENT_AMBULATORY_CARE_PROVIDER_SITE_OTHER): Payer: Self-pay | Admitting: Family Medicine

## 2020-09-01 ENCOUNTER — Other Ambulatory Visit: Payer: Self-pay

## 2020-09-01 ENCOUNTER — Telehealth (INDEPENDENT_AMBULATORY_CARE_PROVIDER_SITE_OTHER): Payer: Federal, State, Local not specified - PPO | Admitting: Family Medicine

## 2020-09-01 DIAGNOSIS — Z6836 Body mass index (BMI) 36.0-36.9, adult: Secondary | ICD-10-CM

## 2020-09-01 DIAGNOSIS — R7303 Prediabetes: Secondary | ICD-10-CM
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
## 2020-09-08 ENCOUNTER — Other Ambulatory Visit (INDEPENDENT_AMBULATORY_CARE_PROVIDER_SITE_OTHER): Payer: Self-pay | Admitting: Family Medicine

## 2020-09-08 DIAGNOSIS — F3289 Other specified depressive episodes: Secondary | ICD-10-CM

## 2020-09-15 ENCOUNTER — Other Ambulatory Visit: Payer: Self-pay

## 2020-09-15 ENCOUNTER — Encounter (INDEPENDENT_AMBULATORY_CARE_PROVIDER_SITE_OTHER): Payer: Self-pay | Admitting: Family Medicine

## 2020-09-15 ENCOUNTER — Ambulatory Visit (INDEPENDENT_AMBULATORY_CARE_PROVIDER_SITE_OTHER): Payer: Federal, State, Local not specified - PPO | Admitting: Family Medicine

## 2020-09-15 VITALS — BP 129/91 | HR 76 | Temp 97.4°F | Ht 66.0 in | Wt 226.0 lb

## 2020-09-15 DIAGNOSIS — M1990 Unspecified osteoarthritis, unspecified site: Secondary | ICD-10-CM | POA: Diagnosis not present

## 2020-09-15 DIAGNOSIS — F3289 Other specified depressive episodes: Secondary | ICD-10-CM

## 2020-09-15 DIAGNOSIS — I10 Essential (primary) hypertension: Secondary | ICD-10-CM | POA: Diagnosis not present

## 2020-09-15 DIAGNOSIS — Z9189 Other specified personal risk factors, not elsewhere classified: Secondary | ICD-10-CM | POA: Diagnosis not present

## 2020-09-15 DIAGNOSIS — Z6836 Body mass index (BMI) 36.0-36.9, adult: Secondary | ICD-10-CM

## 2020-09-15 DIAGNOSIS — R7303 Prediabetes: Secondary | ICD-10-CM

## 2020-09-15 MED ORDER — METFORMIN HCL 500 MG PO TABS: ORAL_TABLET | ORAL | 0 refills | Status: DC

## 2020-09-15 MED ORDER — LOSARTAN POTASSIUM 100 MG PO TABS
ORAL_TABLET | ORAL | 0 refills | Status: DC
Start: 2020-09-15 — End: 2020-11-15

## 2020-09-15 MED ORDER — MELOXICAM 15 MG PO TABS: 15.0000 mg | ORAL_TABLET | Freq: Every day | ORAL | 0 refills | Status: DC

## 2020-09-15 MED ORDER — BUPROPION HCL ER (SR) 150 MG PO TB12: 150.0000 mg | ORAL_TABLET | Freq: Every morning | ORAL | 0 refills | Status: DC

## 2020-09-22 NOTE — Progress Notes (Signed)
Chief Complaint:   OBESITY Cheryl [name] is here to discuss her progress with her obesity treatment plan along with follow-up of her obesity related diagnoses. [name] is on the Category 2 Plan or keeping a food journal and adhering to recommended goals of 1200 calories and 70+ grams of protein daily and states she is following her eating plan approximately 40% of the time. [name] states she is doing 0 minutes 0 times per week.  Today's visit was #: 56 Starting weight: 281 lbs Starting date: 02/09/2018 Today's weight: 226 lbs Today's date: 09/15/2020 Total lbs lost to date: 55 Total lbs lost since last in-office visit: 1  Interim History: [name] continues to do well with gradual weight loss. She is happy with her progress overall. She is not exercising formally but she is trying to stay active especially with caring for her granddaughter. Her hunger is mostly controlled but she still struggles with meal planning and prepping especially with some grocery shortages.  Subjective:   1. Essential hypertension Cheryl [name]'s diastolic blood pressure is a bit elevated, but normally her blood pressure is well controlled. She is working on diet and weight loss.  2. Pre-diabetes Cheryl [name] is stable on metformin, and she requests a refill today. No signs of hypoglycemia was noted.  3. Osteoarthritis, unspecified osteoarthritis type, unspecified site Cheryl [name] is doing well on Mobic for her osteoarthritis. She continue to work on weight loss and exercise.  4. Other depression with emotional eating Cheryl [name] is stable on Wellbutrin. She has been working on decreasing emotional eating behaviors. She is doing well overall.  5. At risk for heart disease Cheryl [name] is at a higher than average risk for cardiovascular disease due to obesity.   Assessment/Plan:   1. Essential hypertension [name] is working on healthy weight loss and exercise to improve blood pressure control. We will watch for signs of hypotension as she  continues her lifestyle modifications. We will refill losartan for 1 month.  - losartan (COZAAR) 100 MG tablet; TAKE 1 TABLET(100 MG) BY MOUTH DAILY  Dispense: 30 tablet; Refill: 0  2. Pre-diabetes [name] will continue to work on weight loss, exercise, and decreasing simple carbohydrates to help decrease the risk of diabetes. We will refill metformin for 1 month.  - metFORMIN (GLUCOPHAGE) 500 MG tablet; TAKE 1 TABLET(500 MG) BY MOUTH TWICE DAILY WITH A MEAL  Dispense: 60 tablet; Refill: 0  3. Osteoarthritis, unspecified osteoarthritis type, unspecified site We will refill Mobic for 1 month. [name] will continue to follow up as directed.  - meloxicam (MOBIC) 15 MG tablet; Take 1 tablet (15 mg total) by mouth at bedtime.  Dispense: 30 tablet; Refill: 0  4. Other depression with emotional eating Behavior modification techniques were discussed today to help Idell deal with her emotional/non-hunger eating behaviors. We will refill Wellbutrin SR for 1 month. Orders and follow up as documented in patient record.   - buPROPion (WELLBUTRIN SR) 150 MG 12 hr tablet; Take 1 tablet (150 mg total) by mouth in the morning.  Dispense: 30 tablet; Refill: 0  5. At risk for heart disease Cheryl [name] was given approximately 15 minutes of coronary artery disease prevention counseling today. She is 65 y.o. female and has risk factors for heart disease including obesity. We discussed intensive lifestyle modifications today with an emphasis on specific weight loss instructions and strategies.   Repetitive spaced learning was employed today to elicit superior memory formation and behavioral change.  6. Class 2 severe obesity with serious comorbidity and body mass  index (BMI) of 36.0 to 36.9 in adult, unspecified obesity type [name]) Cheryl [name] is currently in the action stage of change. As such, her goal is to continue with weight loss efforts. She has agreed to keeping a food journal and adhering to recommended goals of 1200  calories and 70+ grams of protein daily.   We will recheck fasting labs at her next visit.  Behavioral modification strategies: meal planning and cooking strategies.  Cheryl [name] has agreed to follow-up with our clinic in 3 to 4 weeks. She was informed of the importance of frequent follow-up visits to maximize her success with intensive lifestyle modifications for her multiple health conditions.   Objective:   Blood pressure (!) 129/91, pulse 76, temperature (!) 97.4 F (36.3 C), height 5\' 6"  (1.676 m), weight 226 lb (102.5 kg), SpO2 97 %. Body mass index is 36.48 kg/m.  General: Cooperative, alert, well developed, in no acute distress. HEENT: Conjunctivae and lids unremarkable. Cardiovascular: Regular rhythm.  Lungs: Normal work of breathing. Neurologic: No focal deficits.   Lab Results  Component Value Date   CREATININE 0.70 06/10/2020   BUN 23 06/10/2020   NA 142 06/10/2020   K 4.7 06/10/2020   CL 106 06/10/2020   CO2 24 06/10/2020   Lab Results  Component Value Date   ALT 14 06/10/2020   AST 17 06/10/2020   ALKPHOS 87 06/10/2020   BILITOT 0.4 06/10/2020   Lab Results  Component Value Date   HGBA1C 5.2 06/10/2020   HGBA1C 5.4 12/15/2019   HGBA1C 5.5 07/15/2019   HGBA1C 5.3 03/18/2019   HGBA1C 5.4 09/18/2018   Lab Results  Component Value Date   INSULIN 5.4 06/10/2020   INSULIN 5.0 12/15/2019   INSULIN 8.0 07/15/2019   INSULIN 13.0 03/18/2019   INSULIN 4.5 09/18/2018   Lab Results  Component Value Date   TSH 2.980 06/10/2020   Lab Results  Component Value Date   CHOL 222 (H) 06/10/2020   HDL 72 06/10/2020   LDLCALC 139 (H) 06/10/2020   TRIG 66 06/10/2020   CHOLHDL 3.1 09/27/2017   Lab Results  Component Value Date   WBC 5.9 03/18/2019   HGB 13.6 03/18/2019   HCT 41.6 03/18/2019   MCV 91 03/18/2019   PLT 263 06/08/2016   No results found for: IRON, TIBC, FERRITIN  Attestation Statements:   Reviewed by clinician on day of visit: allergies,  medications, problem list, medical history, surgical history, family history, social history, and previous encounter notes.   I, Trixie Dredge, am acting as transcriptionist for Dennard Nip, MD.  I have reviewed the above documentation for accuracy and completeness, and I agree with the above. -  Dennard Nip, MD

## 2020-10-06 ENCOUNTER — Other Ambulatory Visit (INDEPENDENT_AMBULATORY_CARE_PROVIDER_SITE_OTHER): Payer: Self-pay | Admitting: Family Medicine

## 2020-10-06 ENCOUNTER — Encounter (INDEPENDENT_AMBULATORY_CARE_PROVIDER_SITE_OTHER): Payer: Self-pay

## 2020-10-06 DIAGNOSIS — F3289 Other specified depressive episodes: Secondary | ICD-10-CM

## 2020-10-06 DIAGNOSIS — M1990 Unspecified osteoarthritis, unspecified site: Secondary | ICD-10-CM

## 2020-10-06 NOTE — Telephone Encounter (Signed)
Dr.Beasley 

## 2020-10-06 NOTE — Telephone Encounter (Signed)
MyChart message sent to pt to find out if they have enough medication to get them through until next appt.   

## 2020-10-07 ENCOUNTER — Other Ambulatory Visit (INDEPENDENT_AMBULATORY_CARE_PROVIDER_SITE_OTHER): Payer: Self-pay | Admitting: Family Medicine

## 2020-10-07 DIAGNOSIS — I10 Essential (primary) hypertension: Secondary | ICD-10-CM

## 2020-10-07 NOTE — Telephone Encounter (Signed)
Last OV with Dr. Beasley 

## 2020-10-14 ENCOUNTER — Ambulatory Visit (INDEPENDENT_AMBULATORY_CARE_PROVIDER_SITE_OTHER): Payer: Federal, State, Local not specified - PPO | Admitting: Family Medicine

## 2020-10-14 ENCOUNTER — Encounter (INDEPENDENT_AMBULATORY_CARE_PROVIDER_SITE_OTHER): Payer: Self-pay | Admitting: Family Medicine

## 2020-10-14 ENCOUNTER — Other Ambulatory Visit: Payer: Self-pay

## 2020-10-14 VITALS — BP 130/82 | HR 61 | Temp 97.5°F | Ht 66.0 in | Wt 227.0 lb

## 2020-10-14 DIAGNOSIS — R7303 Prediabetes: Secondary | ICD-10-CM | POA: Diagnosis not present

## 2020-10-14 DIAGNOSIS — E782 Mixed hyperlipidemia: Secondary | ICD-10-CM | POA: Diagnosis not present

## 2020-10-14 DIAGNOSIS — R7989 Other specified abnormal findings of blood chemistry: Secondary | ICD-10-CM | POA: Diagnosis not present

## 2020-10-14 DIAGNOSIS — Z9189 Other specified personal risk factors, not elsewhere classified: Secondary | ICD-10-CM

## 2020-10-14 DIAGNOSIS — E88819 Insulin resistance, unspecified: Secondary | ICD-10-CM

## 2020-10-14 DIAGNOSIS — E559 Vitamin D deficiency, unspecified: Secondary | ICD-10-CM | POA: Diagnosis not present

## 2020-10-14 DIAGNOSIS — E8881 Metabolic syndrome: Secondary | ICD-10-CM

## 2020-10-14 DIAGNOSIS — Z6836 Body mass index (BMI) 36.0-36.9, adult: Secondary | ICD-10-CM

## 2020-10-14 MED ORDER — METFORMIN HCL 500 MG PO TABS: ORAL_TABLET | ORAL | 0 refills | Status: DC

## 2020-10-15 LAB — COMPREHENSIVE METABOLIC PANEL
ALT: 14 IU/L (ref 0–32)
AST: 15 IU/L (ref 0–40)
Albumin/Globulin Ratio: 1.7 (ref 1.2–2.2)
Albumin: 4.5 g/dL (ref 3.8–4.8)
Alkaline Phosphatase: 100 IU/L (ref 44–121)
BUN/Creatinine Ratio: 33 — ABNORMAL HIGH (ref 12–28)
BUN: 29 mg/dL — ABNORMAL HIGH (ref 8–27)
Bilirubin Total: 0.3 mg/dL (ref 0.0–1.2)
CO2: 21 mmol/L (ref 20–29)
Calcium: 9.4 mg/dL (ref 8.7–10.3)
Chloride: 104 mmol/L (ref 96–106)
Creatinine, Ser: 0.88 mg/dL (ref 0.57–1.00)
Globulin, Total: 2.6 g/dL (ref 1.5–4.5)
Glucose: 88 mg/dL (ref 65–99)
Potassium: 5 mmol/L (ref 3.5–5.2)
Sodium: 142 mmol/L (ref 134–144)
Total Protein: 7.1 g/dL (ref 6.0–8.5)
eGFR: 73 mL/min/{1.73_m2} (ref >59)

## 2020-10-15 LAB — INSULIN, RANDOM: INSULIN: 5.5 u[IU]/mL (ref 2.6–24.9)

## 2020-10-15 LAB — TSH: TSH: 5.07 u[IU]/mL — ABNORMAL HIGH (ref 0.450–4.500)

## 2020-10-15 LAB — T4, FREE: Free T4: 1.12 ng/dL (ref 0.82–1.77)

## 2020-10-15 LAB — LIPID PANEL WITH LDL/HDL RATIO
Cholesterol, Total: 212 mg/dL — ABNORMAL HIGH (ref 100–199)
HDL: 83 mg/dL (ref >39)
LDL Chol Calc (NIH): 119 mg/dL — ABNORMAL HIGH (ref 0–99)
LDL/HDL Ratio: 1.4 ratio (ref 0.0–3.2)
Triglycerides: 57 mg/dL (ref 0–149)
VLDL Cholesterol Cal: 10 mg/dL (ref 5–40)

## 2020-10-15 LAB — HEMOGLOBIN A1C
Est. average glucose Bld gHb Est-mCnc: 103 mg/dL
Hgb A1c MFr Bld: 5.2 % (ref 4.8–5.6)

## 2020-10-15 LAB — T3: T3, Total: 119 ng/dL (ref 71–180)

## 2020-10-15 LAB — VITAMIN D 25 HYDROXY (VIT D DEFICIENCY, FRACTURES): Vit D, 25-Hydroxy: 52.4 ng/mL (ref 30.0–100.0)

## 2020-10-18 NOTE — Progress Notes (Signed)
Chief Complaint:   OBESITY Cheryl [name] is here to discuss her progress with her obesity treatment plan along with follow-up of her obesity related diagnoses. Cheryl [name] is on keeping a food journal and adhering to recommended goals of 1200 calories and 70+ grams of protein daily and states she is following her eating plan approximately 15% of the time. Cheryl [name] states she is walking for 20-25 minutes 3 times per week.  Today's visit was #: 62 Starting weight: 281 lbs Starting date: 02/09/2018 Today's weight: 227 lbs Today's date: 10/14/2020 Total lbs lost to date: 54 Total lbs lost since last in-office visit: 0  Interim History: Cheryl [name] has had increased stress at work and longer work hours, which has made meal planning difficult. She is sometimes skipping lunch due to her schedule and then eating more at night due to increased hunger.  Subjective:   1. Mixed hyperlipidemia Cheryl [name] is working on diet and exercise. She is due for labs.  2. Insulin resistance Cheryl [name] is on metformin, and she denies nausea or vomiting. She is working on diet.  3. Vitamin D deficiency Cheryl [name] is on Vit D OTC, and she is due for labs. She denies signs of over-replacement.  4. TSH elevation Cheryl [name] is not on thyroid medications. She has a history of elevated TSH. Her fatigue is slightly worsening.  5. At risk for impaired metabolic function Cheryl [name] is at increased risk for impaired metabolic function due to skipping meals and decreased protein.  Assessment/Plan:   1. Mixed hyperlipidemia Cardiovascular risk and specific lipid/LDL goals reviewed. We discussed several lifestyle modifications today and Oma will continue to work on diet, exercise and weight loss efforts. We will check labs today. Orders and follow up as documented in patient record.   - Lipid Panel With LDL/HDL Ratio  2. Insulin resistance Cheryl [name] will continue to work on weight loss, exercise, and decreasing simple carbohydrates to help decrease  the risk of diabetes. We will check labs today, and we will refill metformin for 1 month. Cheryl [name] agreed to follow-up with Cheryl [name] as directed to closely monitor her progress.  - Comprehensive metabolic panel - Insulin, random - Hemoglobin A1c - metFORMIN (GLUCOPHAGE) 500 MG tablet; TAKE 1 TABLET(500 MG) BY MOUTH TWICE DAILY WITH A MEAL  Dispense: 60 tablet; Refill: 0  3. Vitamin D deficiency Low Vitamin D level contributes to fatigue and are associated with obesity, breast, and colon cancer. We will check labs today. Cheryl [name] will follow-up for routine testing of Vitamin D, at least 2-3 times per year to avoid over-replacement.  - VITAMIN D 25 Hydroxy (Vit-D Deficiency, Fractures)  4. TSH elevation We will check labs today. Cheryl [name] will continue to follow up as directed. Orders and follow up as documented in patient record.  - TSH - T4, free - T3  5. At risk for impaired metabolic function Cheryl [name] was given approximately 15 minutes of impaired  metabolic function prevention counseling today. We discussed intensive lifestyle modifications today with an emphasis on specific nutrition and exercise instructions and strategies.   Repetitive spaced learning was employed today to elicit superior memory formation and behavioral change.  6. Class 2 severe obesity with serious comorbidity and body mass index (BMI) of 36.0 to 36.9 in adult, unspecified obesity type [facility]) Janika is currently in the action stage of change. As such, her goal is to continue with weight loss efforts. She has agreed to keeping a food journal and adhering to recommended goals of 1200 calories and 70+ grams of protein  daily.   Exercise goals: As is.  Behavioral modification strategies: no skipping meals, meal planning and cooking strategies and planning for success.  Cheryl [name] has agreed to follow-up with our clinic in 4 weeks. She was informed of the importance of frequent follow-up visits to maximize her success with intensive  lifestyle modifications for her multiple health conditions.   Cheryl [name] was informed we would discuss her lab results at her next visit unless there is a critical issue that needs to be addressed sooner. Cheryl [name] agreed to keep her next visit at the agreed upon time to discuss these results.  Objective:   Blood pressure 130/82, pulse 61, temperature (!) 97.5 F (36.4 C), height 5\' 6"  (1.676 m), weight 227 lb (103 kg), SpO2 97 %. Body mass index is 36.64 kg/m.  General: Cooperative, alert, well developed, in no acute distress. HEENT: Conjunctivae and lids unremarkable. Cardiovascular: Regular rhythm.  Lungs: Normal work of breathing. Neurologic: No focal deficits.   Lab Results  Component Value Date   CREATININE 0.88 10/14/2020   BUN 29 (H) 10/14/2020   NA 142 10/14/2020   K 5.0 10/14/2020   CL 104 10/14/2020   CO2 21 10/14/2020   Lab Results  Component Value Date   ALT 14 10/14/2020   AST 15 10/14/2020   ALKPHOS 100 10/14/2020   BILITOT 0.3 10/14/2020   Lab Results  Component Value Date   HGBA1C 5.2 10/14/2020   HGBA1C 5.2 06/10/2020   HGBA1C 5.4 12/15/2019   HGBA1C 5.5 07/15/2019   HGBA1C 5.3 03/18/2019   Lab Results  Component Value Date   INSULIN 5.5 10/14/2020   INSULIN 5.4 06/10/2020   INSULIN 5.0 12/15/2019   INSULIN 8.0 07/15/2019   INSULIN 13.0 03/18/2019   Lab Results  Component Value Date   TSH 5.070 (H) 10/14/2020   Lab Results  Component Value Date   CHOL 212 (H) 10/14/2020   HDL 83 10/14/2020   LDLCALC 119 (H) 10/14/2020   TRIG 57 10/14/2020   CHOLHDL 3.1 09/27/2017   Lab Results  Component Value Date   WBC 5.9 03/18/2019   HGB 13.6 03/18/2019   HCT 41.6 03/18/2019   MCV 91 03/18/2019   PLT 263 06/08/2016   No results found for: IRON, TIBC, FERRITIN  Attestation Statements:   Reviewed by clinician on day of visit: allergies, medications, problem list, medical history, surgical history, family history, social history, and previous  encounter notes.   I, Trixie Dredge, am acting as transcriptionist for Dennard Nip, MD.  I have reviewed the above documentation for accuracy and completeness, and I agree with the above. -  Dennard Nip, MD

## 2020-11-02 ENCOUNTER — Other Ambulatory Visit (INDEPENDENT_AMBULATORY_CARE_PROVIDER_SITE_OTHER): Payer: Self-pay | Admitting: Family Medicine

## 2020-11-02 ENCOUNTER — Encounter (INDEPENDENT_AMBULATORY_CARE_PROVIDER_SITE_OTHER): Payer: Self-pay

## 2020-11-02 DIAGNOSIS — M1990 Unspecified osteoarthritis, unspecified site: Secondary | ICD-10-CM

## 2020-11-02 DIAGNOSIS — F3289 Other specified depressive episodes: Secondary | ICD-10-CM

## 2020-11-02 NOTE — Telephone Encounter (Signed)
MyChart message sent to pt to find out if they have enough medication to get them through until next appt.   

## 2020-11-02 NOTE — Telephone Encounter (Signed)
Last OV with Dr. Beasley 

## 2020-11-03 NOTE — Telephone Encounter (Signed)
Would you like to refill? 

## 2020-11-03 NOTE — Telephone Encounter (Signed)
Rx refilled.

## 2020-11-03 NOTE — Telephone Encounter (Signed)
Yes x 1 

## 2020-11-09 ENCOUNTER — Other Ambulatory Visit (INDEPENDENT_AMBULATORY_CARE_PROVIDER_SITE_OTHER): Payer: Self-pay | Admitting: Family Medicine

## 2020-11-09 DIAGNOSIS — F3289 Other specified depressive episodes: Secondary | ICD-10-CM

## 2020-11-11 ENCOUNTER — Other Ambulatory Visit (INDEPENDENT_AMBULATORY_CARE_PROVIDER_SITE_OTHER): Payer: Self-pay | Admitting: Family Medicine

## 2020-11-11 DIAGNOSIS — I10 Essential (primary) hypertension: Secondary | ICD-10-CM

## 2020-11-15 ENCOUNTER — Ambulatory Visit (INDEPENDENT_AMBULATORY_CARE_PROVIDER_SITE_OTHER): Payer: Federal, State, Local not specified - PPO | Admitting: Family Medicine

## 2020-11-15 ENCOUNTER — Other Ambulatory Visit: Payer: Self-pay

## 2020-11-15 ENCOUNTER — Encounter (INDEPENDENT_AMBULATORY_CARE_PROVIDER_SITE_OTHER): Payer: Self-pay | Admitting: Family Medicine

## 2020-11-15 VITALS — BP 142/83 | HR 77 | Temp 97.7°F | Ht 66.0 in | Wt 230.0 lb

## 2020-11-15 DIAGNOSIS — M1711 Unilateral primary osteoarthritis, right knee: Secondary | ICD-10-CM

## 2020-11-15 DIAGNOSIS — F3289 Other specified depressive episodes: Secondary | ICD-10-CM | POA: Diagnosis not present

## 2020-11-15 DIAGNOSIS — Z9189 Other specified personal risk factors, not elsewhere classified: Secondary | ICD-10-CM

## 2020-11-15 DIAGNOSIS — E8881 Metabolic syndrome: Secondary | ICD-10-CM | POA: Diagnosis not present

## 2020-11-15 DIAGNOSIS — E559 Vitamin D deficiency, unspecified: Secondary | ICD-10-CM

## 2020-11-15 DIAGNOSIS — I10 Essential (primary) hypertension: Secondary | ICD-10-CM | POA: Diagnosis not present

## 2020-11-15 DIAGNOSIS — Z6841 Body Mass Index (BMI) 40.0 and over, adult: Secondary | ICD-10-CM

## 2020-11-15 MED ORDER — METFORMIN HCL 500 MG PO TABS: ORAL_TABLET | ORAL | 0 refills | Status: DC

## 2020-11-15 MED ORDER — BUPROPION HCL ER (SR) 150 MG PO TB12
ORAL_TABLET | ORAL | 0 refills | Status: DC
Start: 2020-11-15 — End: 2020-12-02

## 2020-11-15 MED ORDER — VENLAFAXINE HCL 75 MG PO TABS: 75.0000 mg | ORAL_TABLET | Freq: Every day | ORAL | 0 refills | Status: DC

## 2020-11-15 MED ORDER — LOSARTAN POTASSIUM 100 MG PO TABS
ORAL_TABLET | ORAL | 0 refills | Status: DC
Start: 2020-11-15 — End: 2020-12-16

## 2020-11-15 MED ORDER — MELOXICAM 15 MG PO TABS
ORAL_TABLET | ORAL | 0 refills | Status: DC
Start: 2020-11-15 — End: 2020-12-02

## 2020-11-30 NOTE — Progress Notes (Signed)
Chief Complaint:   OBESITY Cheryl [name] is here to discuss her progress with her obesity treatment plan along with follow-up of her obesity related diagnoses. Cheryl [name] is on keeping a food journal and adhering to recommended goals of 1200 calories and 70+ grams of protein daily and states she is following her eating plan approximately 33% of the time. Cheryl [name] states she is walking 7,000 steps daily.   Today's visit was #: 6 Starting weight: 281 lbs Starting date: 02/09/2018 Today's weight: 230 lbs Today's date: 11/15/2020 Total lbs lost to date: 51 Total lbs lost since last in-office visit: 0  Interim History: Cheryl [name] has been struggling more with increased temptations and some increased work stress. She will be going on vacation to the beach over Spring break.  Subjective:   1. Essential hypertension Cheryl [name]'s blood pressure is mildly elevated today. Her blood pressure is normally well controlled, but she has had increased work stress.  2. Insulin resistance Cheryl [name]'s labs are well controlled with diet, weight loss, and metformin.  3. Osteoarthritis of right knee, unspecified osteoarthritis type Cheryl [name]'s knee pain fluctuates, worse with weather changes but Mobic is still helping.   4. Other depression with emotional eating Cheryl [name]'s mood is stable on her medications. She has had increased stress but she is handling this stress better.  5. At risk for heart disease Cheryl [name] is at a higher than average risk for cardiovascular disease due to obesity.   Assessment/Plan:   1. Essential hypertension Cheryl [name] is working on healthy weight loss and exercise to improve blood pressure control. We will watch for signs of hypotension as she continues her lifestyle modifications. We will refill Cozaar for 1 month.  - losartan (COZAAR) 100 MG tablet; TAKE 1 TABLET(100 MG) BY MOUTH DAILY  Dispense: 30 tablet; Refill: 0  2. Insulin resistance Cheryl [name] will continue to work on weight loss, exercise, and  decreasing simple carbohydrates to help decrease the risk of diabetes. We will refill metformin for 1 month. Cheryl [name] agreed to follow-up with [name] as directed to closely monitor her progress.  - metFORMIN (GLUCOPHAGE) 500 MG tablet; TAKE 1 TABLET(500 MG) BY MOUTH TWICE DAILY WITH A MEAL  Dispense: 60 tablet; Refill: 0  3. Osteoarthritis of right knee, unspecified osteoarthritis type We will refill Mobic for 1 month, and Cheryl [name] will continue to follow up as directed.  - meloxicam (MOBIC) 15 MG tablet; TAKE 1 TABLET(15 MG) BY MOUTH AT BEDTIME  Dispense: 30 tablet; Refill: 0  4. Other depression with emotional eating Behavior modification techniques were discussed today to help Cheryl [name] deal with her emotional/non-hunger eating behaviors. We will refill Wellbutrin SR and Effexor for 1 month. Orders and follow up as documented in patient record.   - buPROPion (WELLBUTRIN SR) 150 MG 12 hr tablet; TAKE 1 TABLET(150 MG) BY MOUTH IN THE MORNING  Dispense: 30 tablet; Refill: 0 - venlafaxine (EFFEXOR) 75 MG tablet; Take 1 tablet (75 mg total) by mouth daily.  Dispense: 30 tablet; Refill: 0  5. At risk for heart disease Cheryl [name] was given approximately 15 minutes of coronary artery disease prevention counseling today. She is 65 y.o. female and has risk factors for heart disease including obesity. We discussed intensive lifestyle modifications today with an emphasis on specific weight loss instructions and strategies.   Repetitive spaced learning was employed today to elicit superior memory formation and behavioral change.  6. Obesity with current BMI of 37.2 Cheryl [name] is currently in the action stage of change. As such, her goal is to  continue with weight loss efforts. She has agreed to keeping a food journal and adhering to recommended goals of 1200 calories and 70+ grams of protein daily.   Exercise goals: As is.  Behavioral modification strategies: increasing lean protein intake and travel eating  strategies.  Cheryl [name] has agreed to follow-up with our clinic in 4 weeks. She was informed of the importance of frequent follow-up visits to maximize her success with intensive lifestyle modifications for her multiple health conditions.   Objective:   Blood pressure (!) 142/83, pulse 77, temperature 97.7 F (36.5 C), height 5\' 6"  (1.676 m), weight 230 lb (104.3 kg), SpO2 97 %. Body mass index is 37.12 kg/m.  General: Cooperative, alert, well developed, in no acute distress. HEENT: Conjunctivae and lids unremarkable. Cardiovascular: Regular rhythm.  Lungs: Normal work of breathing. Neurologic: No focal deficits.   Lab Results  Component Value Date   CREATININE 0.88 10/14/2020   BUN 29 (H) 10/14/2020   NA 142 10/14/2020   K 5.0 10/14/2020   CL 104 10/14/2020   CO2 21 10/14/2020   Lab Results  Component Value Date   ALT 14 10/14/2020   AST 15 10/14/2020   ALKPHOS 100 10/14/2020   BILITOT 0.3 10/14/2020   Lab Results  Component Value Date   HGBA1C 5.2 10/14/2020   HGBA1C 5.2 06/10/2020   HGBA1C 5.4 12/15/2019   HGBA1C 5.5 07/15/2019   HGBA1C 5.3 03/18/2019   Lab Results  Component Value Date   INSULIN 5.5 10/14/2020   INSULIN 5.4 06/10/2020   INSULIN 5.0 12/15/2019   INSULIN 8.0 07/15/2019   INSULIN 13.0 03/18/2019   Lab Results  Component Value Date   TSH 5.070 (H) 10/14/2020   Lab Results  Component Value Date   CHOL 212 (H) 10/14/2020   HDL 83 10/14/2020   LDLCALC 119 (H) 10/14/2020   TRIG 57 10/14/2020   CHOLHDL 3.1 09/27/2017   Lab Results  Component Value Date   WBC 5.9 03/18/2019   HGB 13.6 03/18/2019   HCT 41.6 03/18/2019   MCV 91 03/18/2019   PLT 263 06/08/2016   No results found for: IRON, TIBC, FERRITIN  Attestation Statements:   Reviewed by clinician on day of visit: allergies, medications, problem list, medical history, surgical history, family history, social history, and previous encounter notes.   I, Trixie Dredge, am acting as  transcriptionist for Dennard Nip, MD.  I have reviewed the above documentation for accuracy and completeness, and I agree with the above. -  Dennard Nip, MD

## 2020-12-02 ENCOUNTER — Other Ambulatory Visit (INDEPENDENT_AMBULATORY_CARE_PROVIDER_SITE_OTHER): Payer: Self-pay | Admitting: Family Medicine

## 2020-12-02 DIAGNOSIS — M1711 Unilateral primary osteoarthritis, right knee: Secondary | ICD-10-CM

## 2020-12-02 DIAGNOSIS — F3289 Other specified depressive episodes: Secondary | ICD-10-CM

## 2020-12-02 NOTE — Telephone Encounter (Signed)
Would you like to refill? 

## 2020-12-02 NOTE — Telephone Encounter (Signed)
Dr.Beasley 

## 2020-12-02 NOTE — Telephone Encounter (Signed)
Ok x 1

## 2020-12-13 ENCOUNTER — Other Ambulatory Visit (INDEPENDENT_AMBULATORY_CARE_PROVIDER_SITE_OTHER): Payer: Self-pay | Admitting: Family Medicine

## 2020-12-13 DIAGNOSIS — I10 Essential (primary) hypertension: Secondary | ICD-10-CM

## 2020-12-13 NOTE — Telephone Encounter (Signed)
Pt last seen by Dr. Beasley.  

## 2020-12-16 ENCOUNTER — Ambulatory Visit (INDEPENDENT_AMBULATORY_CARE_PROVIDER_SITE_OTHER): Payer: Federal, State, Local not specified - PPO | Admitting: Family Medicine

## 2020-12-16 ENCOUNTER — Encounter (INDEPENDENT_AMBULATORY_CARE_PROVIDER_SITE_OTHER): Payer: Self-pay | Admitting: Family Medicine

## 2020-12-16 ENCOUNTER — Other Ambulatory Visit: Payer: Self-pay

## 2020-12-16 VITALS — BP 116/88 | HR 80 | Temp 97.7°F | Ht 66.0 in | Wt 228.0 lb

## 2020-12-16 DIAGNOSIS — F3289 Other specified depressive episodes: Secondary | ICD-10-CM

## 2020-12-16 DIAGNOSIS — I10 Essential (primary) hypertension: Secondary | ICD-10-CM

## 2020-12-16 DIAGNOSIS — M1711 Unilateral primary osteoarthritis, right knee: Secondary | ICD-10-CM

## 2020-12-16 DIAGNOSIS — Z6836 Body mass index (BMI) 36.0-36.9, adult: Secondary | ICD-10-CM

## 2020-12-16 DIAGNOSIS — E559 Vitamin D deficiency, unspecified: Secondary | ICD-10-CM

## 2020-12-16 DIAGNOSIS — E88819 Insulin resistance, unspecified: Secondary | ICD-10-CM

## 2020-12-16 DIAGNOSIS — E8881 Metabolic syndrome: Secondary | ICD-10-CM | POA: Diagnosis not present

## 2020-12-16 DIAGNOSIS — E538 Deficiency of other specified B group vitamins: Secondary | ICD-10-CM

## 2020-12-16 DIAGNOSIS — M25552 Pain in left hip: Secondary | ICD-10-CM

## 2020-12-16 DIAGNOSIS — Z9189 Other specified personal risk factors, not elsewhere classified: Secondary | ICD-10-CM | POA: Diagnosis not present

## 2020-12-16 DIAGNOSIS — E7849 Other hyperlipidemia: Secondary | ICD-10-CM

## 2020-12-16 MED ORDER — LOSARTAN POTASSIUM 100 MG PO TABS: ORAL_TABLET | ORAL | 0 refills | Status: DC

## 2020-12-16 MED ORDER — MELOXICAM 15 MG PO TABS: ORAL_TABLET | ORAL | 0 refills | Status: DC

## 2020-12-16 MED ORDER — METFORMIN HCL 500 MG PO TABS: ORAL_TABLET | ORAL | 0 refills | Status: DC

## 2020-12-20 MED ORDER — VENLAFAXINE HCL 75 MG PO TABS: 75.0000 mg | ORAL_TABLET | Freq: Every day | ORAL | 0 refills | Status: DC

## 2020-12-20 MED ORDER — BUPROPION HCL ER (XL) 150 MG PO TB24
150.0000 mg | ORAL_TABLET | Freq: Every day | ORAL | 0 refills | Status: DC
Start: 2020-12-20 — End: 2021-01-24

## 2020-12-21 NOTE — Progress Notes (Signed)
Chief Complaint:   OBESITY Cheryl [name] is here to discuss her progress with her obesity treatment plan along with follow-up of her obesity related diagnoses. Cheryl [name] is on keeping a food journal and adhering to recommended goals of 1200 calories and 70+ grams of protein daily and states she is following her eating plan approximately 25% of the time. Cheryl [name] states she is doing 0 minutes 0 times per week.  Today's visit was #: 33 Starting weight: 281 lbs Starting date: 02/09/2018 Today's weight: 228 lbs Today's date: 12/20/2020 Total lbs lost to date: 41 Total lbs lost since last in-office visit: 2  Interim History: Cheryl [name] continues to do well with weight loss even with being on vacation. She feels she is doing well with portion control, and she has decreased simple carbohydrates. She is trying to increase her water intake and be more active.  Subjective:   1. Essential hypertension [name]'s blood pressure is well controlled with diet and weight loss, and with her medications.  2. Insulin resistance Cheryl [name] is working on her diet. She is on metformin, and she requests a refill today.  3. Osteoarthritis of right knee, unspecified osteoarthritis type Cheryl [name] notes her pain is stable on her medications. She is working on increasing activity and weight loss.  4. Other depression with emotional eating Cheryl [name] is staruggling with increased cravings in the last week.  5. At risk for complication associated with hypotension Cheryl [name] is at a higher than average risk of hypotension with continued weight loss.  Assessment/Plan:   1. Essential hypertension Cheryl [name] will continue working on healthy weight loss and exercise to improve blood pressure control. We will watch for signs of hypotension as she continues her lifestyle modifications. We will refill losartan for 1 month.  - losartan (COZAAR) 100 MG tablet; TAKE 1 TABLET(100 MG) BY MOUTH DAILY  Dispense: 30 tablet; Refill: 0  2. Insulin  resistance Cheryl [name] will continue to work on weight loss, exercise, and decreasing simple carbohydrates to help decrease the risk of diabetes. We will refill metformin for 1 month. Cheryl [name] agreed to follow-up with [name] as directed to closely monitor her progress.  - metFORMIN (GLUCOPHAGE) 500 MG tablet; TAKE 1 TABLET(500 MG) BY MOUTH TWICE DAILY WITH A MEAL  Dispense: 60 tablet; Refill: 0  3. Osteoarthritis of right knee, unspecified osteoarthritis type We will refill Mobic for 1 month, and Nichele will continue to follow up as directed.  - meloxicam (MOBIC) 15 MG tablet; 1 po qday prn  Dispense: 30 tablet; Refill: 0  4. Other depression with emotional eating Behavior modification techniques were discussed today to help Alaylah deal with her emotional/non-hunger eating behaviors. We will refill Effexor for 1 month, and Vaneza agreed to change Wellbutrin to XL 150 mg q daily with no refills. Orders and follow up as documented in patient record.   - venlafaxine (EFFEXOR) 75 MG tablet; Take 1 tablet (75 mg total) by mouth daily.  Dispense: 30 tablet; Refill: 0 - buPROPion (WELLBUTRIN XL) 150 MG 24 hr tablet; Take 1 tablet (150 mg total) by mouth daily.  Dispense: 30 tablet; Refill: 0  5. At risk for complication associated with hypotension Cheryl [name] was given approximately 15 minutes of education and counseling today to help avoid hypotension. We discussed risks of hypotension with weight loss and signs of hypotension such as feeling lightheaded or unsteady.  Repetitive spaced learning was employed today to elicit superior memory formation and behavioral change.  6. Obesity with current BMI 36.8 Cheryl [name] is currently in  the action stage of change. As such, her goal is to continue with weight loss efforts. She has agreed to keeping a food journal and adhering to recommended goals of 1200 calories and 70+ grams of protein daily.   Behavioral modification strategies: increasing lean protein intake and meal  planning and cooking strategies.  Cheryl [name] has agreed to follow-up with our clinic in 4 weeks. She was informed of the importance of frequent follow-up visits to maximize her success with intensive lifestyle modifications for her multiple health conditions.   Objective:   Blood pressure 116/88, pulse 80, temperature 97.7 F (36.5 C), height 5\' 6"  (1.676 m), weight 228 lb (103.4 kg), SpO2 98 %. Body mass index is 36.8 kg/m.  General: Cooperative, alert, well developed, in no acute distress. HEENT: Conjunctivae and lids unremarkable. Cardiovascular: Regular rhythm.  Lungs: Normal work of breathing. Neurologic: No focal deficits.   Lab Results  Component Value Date   CREATININE 0.88 10/14/2020   BUN 29 (H) 10/14/2020   NA 142 10/14/2020   K 5.0 10/14/2020   CL 104 10/14/2020   CO2 21 10/14/2020   Lab Results  Component Value Date   ALT 14 10/14/2020   AST 15 10/14/2020   ALKPHOS 100 10/14/2020   BILITOT 0.3 10/14/2020   Lab Results  Component Value Date   HGBA1C 5.2 10/14/2020   HGBA1C 5.2 06/10/2020   HGBA1C 5.4 12/15/2019   HGBA1C 5.5 07/15/2019   HGBA1C 5.3 03/18/2019   Lab Results  Component Value Date   INSULIN 5.5 10/14/2020   INSULIN 5.4 06/10/2020   INSULIN 5.0 12/15/2019   INSULIN 8.0 07/15/2019   INSULIN 13.0 03/18/2019   Lab Results  Component Value Date   TSH 5.070 (H) 10/14/2020   Lab Results  Component Value Date   CHOL 212 (H) 10/14/2020   HDL 83 10/14/2020   LDLCALC 119 (H) 10/14/2020   TRIG 57 10/14/2020   CHOLHDL 3.1 09/27/2017   Lab Results  Component Value Date   WBC 5.9 03/18/2019   HGB 13.6 03/18/2019   HCT 41.6 03/18/2019   MCV 91 03/18/2019   PLT 263 06/08/2016   No results found for: IRON, TIBC, FERRITIN  Attestation Statements:   Reviewed by clinician on day of visit: allergies, medications, problem list, medical history, surgical history, family history, social history, and previous encounter notes.   I, Trixie Dredge,  am acting as transcriptionist for Dennard Nip, MD.  I have reviewed the above documentation for accuracy and completeness, and I agree with the above. -  Dennard Nip, MD

## 2021-01-04 ENCOUNTER — Other Ambulatory Visit (INDEPENDENT_AMBULATORY_CARE_PROVIDER_SITE_OTHER): Payer: Self-pay | Admitting: Family Medicine

## 2021-01-04 DIAGNOSIS — F3289 Other specified depressive episodes: Secondary | ICD-10-CM

## 2021-01-04 NOTE — Telephone Encounter (Signed)
Ok x 1

## 2021-01-04 NOTE — Telephone Encounter (Signed)
Patient is requesting a refill of the following medications: Requested Prescriptions   Pending Prescriptions Disp Refills   buPROPion (WELLBUTRIN SR) 150 MG 12 hr tablet [Pharmacy Med Name: BUPROPION SR 150MG  TABLETS (12 H)] 30 tablet 0    Sig: TAKE 1 TABLET(150 MG) BY MOUTH IN THE MORNING     Last office visit: 12/26/53 Date of last refill: 12/20/20 Last refill amount: 30 Follow up time period per chart: 4 week Next scheduled appt: 01/24/21

## 2021-01-05 ENCOUNTER — Other Ambulatory Visit (INDEPENDENT_AMBULATORY_CARE_PROVIDER_SITE_OTHER): Payer: Self-pay | Admitting: Family Medicine

## 2021-01-05 DIAGNOSIS — M1711 Unilateral primary osteoarthritis, right knee: Secondary | ICD-10-CM

## 2021-01-05 NOTE — Telephone Encounter (Signed)
Patient is requesting a refill of the following medications: Requested Prescriptions   Pending Prescriptions Disp Refills   meloxicam (MOBIC) 15 MG tablet [Pharmacy Med Name: MELOXICAM 15MG  TABLETS] 30 tablet 0    Sig: TAKE 1 TABLET(15 MG) BY MOUTH AT BEDTIME     Last office visit: 12/16/20 Date of last refill: 12/16/20 Last refill amount: 30 Follow up time period per chart: 4 week Next appt scheduled: 01/24/21

## 2021-01-05 NOTE — Telephone Encounter (Signed)
Ok x 1

## 2021-01-17 ENCOUNTER — Ambulatory Visit (INDEPENDENT_AMBULATORY_CARE_PROVIDER_SITE_OTHER): Payer: Federal, State, Local not specified - PPO | Admitting: Family Medicine

## 2021-01-19 ENCOUNTER — Other Ambulatory Visit (INDEPENDENT_AMBULATORY_CARE_PROVIDER_SITE_OTHER): Payer: Self-pay | Admitting: Family Medicine

## 2021-01-19 DIAGNOSIS — E8881 Metabolic syndrome: Secondary | ICD-10-CM

## 2021-01-19 DIAGNOSIS — I10 Essential (primary) hypertension: Secondary | ICD-10-CM

## 2021-01-19 DIAGNOSIS — F3289 Other specified depressive episodes: Secondary | ICD-10-CM

## 2021-01-20 ENCOUNTER — Other Ambulatory Visit (INDEPENDENT_AMBULATORY_CARE_PROVIDER_SITE_OTHER): Payer: Self-pay | Admitting: Family Medicine

## 2021-01-20 DIAGNOSIS — F3289 Other specified depressive episodes: Secondary | ICD-10-CM

## 2021-01-20 NOTE — Telephone Encounter (Signed)
Patient is requesting a refill of the following medications: Requested Prescriptions   Pending Prescriptions Disp Refills   metFORMIN (GLUCOPHAGE) 500 MG tablet [Pharmacy Med Name: METFORMIN 500MG  TABLETS] 180 tablet     Sig: TAKE 1 TABLET(500 MG) BY MOUTH TWICE DAILY WITH A MEAL   losartan (COZAAR) 100 MG tablet [Pharmacy Med Name: LOSARTAN 100MG  TABLETS] 90 tablet     Sig: TAKE 1 TABLET(100 MG) BY MOUTH DAILY     Last office visit: 12/16/20 Date of last refill: 12/20/20 Last refill amount: 30 Follow up time period per chart: 4wk Next schedule appt: 01/24/21

## 2021-01-20 NOTE — Telephone Encounter (Signed)
Patient is requesting a refill of the following medications: Requested Prescriptions   Pending Prescriptions Disp Refills   buPROPion (WELLBUTRIN XL) 150 MG 24 hr tablet [Pharmacy Med Name: BUPROPION XL 150MG  TABLETS (24 H)] 30 tablet 0    Sig: TAKE 1 TABLET BY MOUTH DAILY.     Last office visit: 12/16/20 Date of last refill: 12/20/20 Last refill amount: 30 Follow up time period per chart: 4wk Next schedule appt: 01/24/21

## 2021-01-20 NOTE — Telephone Encounter (Signed)
Patient is requesting a refill of the following medications: Requested Prescriptions   Pending Prescriptions Disp Refills   venlafaxine (EFFEXOR) 75 MG tablet [Pharmacy Med Name: VENLAFAXINE 75MG  TABLETS] 30 tablet 0    Sig: TAKE 1 TABLET BY MOUTH DAILY.    Last office visit: 12/16/20 Date of last refill: 12/20/20 Last refill amount: 30 Follow up time period per chart: 4 week Next appt scheduled: 01/24/21

## 2021-01-24 ENCOUNTER — Encounter (INDEPENDENT_AMBULATORY_CARE_PROVIDER_SITE_OTHER): Payer: Self-pay | Admitting: Family Medicine

## 2021-01-24 ENCOUNTER — Ambulatory Visit (INDEPENDENT_AMBULATORY_CARE_PROVIDER_SITE_OTHER): Payer: Federal, State, Local not specified - PPO | Admitting: Family Medicine

## 2021-01-24 ENCOUNTER — Other Ambulatory Visit: Payer: Self-pay

## 2021-01-24 VITALS — BP 147/94 | HR 93 | Temp 97.8°F | Ht 66.0 in | Wt 229.0 lb

## 2021-01-24 DIAGNOSIS — Z6841 Body Mass Index (BMI) 40.0 and over, adult: Secondary | ICD-10-CM | POA: Diagnosis not present

## 2021-01-24 DIAGNOSIS — R7303 Prediabetes: Secondary | ICD-10-CM

## 2021-01-27 NOTE — Progress Notes (Signed)
Chief Complaint:   OBESITY Brina is here to discuss her progress with her obesity treatment plan along with follow-up of her obesity related diagnoses. Calina is on keeping a food journal and adhering to recommended goals of 1200 calories and 70+ grams of protein daily and states she is following her eating plan approximately 20% of the time. Lin states she is walking 7,000 steps 4 times per week.  Today's visit was #: 79 Starting weight: 281 lbs Starting date: 02/09/2018 Today's weight: 229 lbs Today's date: 01/24/2021 Total lbs lost to date: 52 Total lbs lost since last in-office visit: 0  Interim History: Celie is retaining some fluid with a recent fall and left knee injury. She is feeling better today and she is able to ambulate. She has joined a Phot[employer] and she plans on being more active this Summer.  Subjective:   1. Pre-diabetes Ellanore is stable on metformin, and she is working on diet and weight loss. She denies nausea or vomiting.  Assessment/Plan:   1. Pre-diabetes Cydnee will continue metformin as is, no refill needed today. We will recheck labs in 1-2 months. She will continue to work on weight loss, exercise, and decreasing simple carbohydrates to help decrease the risk of diabetes.   2. Obesity with current BMI 37.1 Steffie is currently in the action stage of change. As such, her goal is to continue with weight loss efforts. She has agreed to keeping a food journal and adhering to recommended goals of 1200 calories and 70+ grams of protein daily.   Exercise goals: As is.  Behavioral modification strategies: increasing water intake.  Shuntay has agreed to follow-up with our clinic in 2 weeks. She was informed of the importance of frequent follow-up visits to maximize her success with intensive lifestyle modifications for her multiple health conditions.   Objective:   Blood pressure (!) 147/94, pulse 93, temperature 97.8 F (36.6 C), height 5\' 6"  (1.676  m), weight 229 lb (103.9 kg), SpO2 97 %. Body mass index is 36.96 kg/m.  General: Cooperative, alert, well developed, in no acute distress. HEENT: Conjunctivae and lids unremarkable. Cardiovascular: Regular rhythm.  Lungs: Normal work of breathing. Neurologic: No focal deficits.   Lab Results  Component Value Date   CREATININE 0.88 10/14/2020   BUN 29 (H) 10/14/2020   NA 142 10/14/2020   K 5.0 10/14/2020   CL 104 10/14/2020   CO2 21 10/14/2020   Lab Results  Component Value Date   ALT 14 10/14/2020   AST 15 10/14/2020   ALKPHOS 100 10/14/2020   BILITOT 0.3 10/14/2020   Lab Results  Component Value Date   HGBA1C 5.2 10/14/2020   HGBA1C 5.2 06/10/2020   HGBA1C 5.4 12/15/2019   HGBA1C 5.5 07/15/2019   HGBA1C 5.3 03/18/2019   Lab Results  Component Value Date   INSULIN 5.5 10/14/2020   INSULIN 5.4 06/10/2020   INSULIN 5.0 12/15/2019   INSULIN 8.0 07/15/2019   INSULIN 13.0 03/18/2019   Lab Results  Component Value Date   TSH 5.070 (H) 10/14/2020   Lab Results  Component Value Date   CHOL 212 (H) 10/14/2020   HDL 83 10/14/2020   LDLCALC 119 (H) 10/14/2020   TRIG 57 10/14/2020   CHOLHDL 3.1 09/27/2017   Lab Results  Component Value Date   WBC 5.9 03/18/2019   HGB 13.6 03/18/2019   HCT 41.6 03/18/2019   MCV 91 03/18/2019   PLT 263 06/08/2016   No results found for:  IRON, TIBC, FERRITIN  Attestation Statements:   Reviewed by clinician on day of visit: allergies, medications, problem list, medical history, surgical history, family history, social history, and previous encounter notes.  Time spent on visit including pre-visit chart review and post-visit care and charting was 22 minutes.    I, Trixie Dredge, am acting as transcriptionist for Dennard Nip, MD.  I have reviewed the above documentation for accuracy and completeness, and I agree with the above. -  Dennard Nip, MD

## 2021-02-08 ENCOUNTER — Ambulatory Visit (INDEPENDENT_AMBULATORY_CARE_PROVIDER_SITE_OTHER): Payer: Federal, State, Local not specified - PPO | Admitting: Family Medicine

## 2021-02-08 ENCOUNTER — Encounter (INDEPENDENT_AMBULATORY_CARE_PROVIDER_SITE_OTHER): Payer: Self-pay | Admitting: Family Medicine

## 2021-02-08 ENCOUNTER — Other Ambulatory Visit: Payer: Self-pay

## 2021-02-08 VITALS — BP 122/84 | HR 83 | Temp 97.8°F | Ht 66.0 in | Wt 229.0 lb

## 2021-02-08 DIAGNOSIS — I10 Essential (primary) hypertension: Secondary | ICD-10-CM

## 2021-02-08 DIAGNOSIS — Z6841 Body Mass Index (BMI) 40.0 and over, adult: Secondary | ICD-10-CM

## 2021-02-08 DIAGNOSIS — M170 Bilateral primary osteoarthritis of knee: Secondary | ICD-10-CM

## 2021-02-08 DIAGNOSIS — S90122A Contusion of left lesser toe(s) without damage to nail, initial encounter: Secondary | ICD-10-CM | POA: Diagnosis not present

## 2021-02-15 NOTE — Progress Notes (Signed)
Chief Complaint:   OBESITY Cheryl [name] is here to discuss her progress with her obesity treatment plan along with follow-up of her obesity related diagnoses. Cheryl [name] is on keeping a food journal and adhering to recommended goals of 1200 calories and 70+ grams of protein daily and states she is following her eating plan approximately 50% of the time. Cheryl [name] states she is doing 0 minutes 0 times per week.  Today's visit was #: 26 Starting weight: 281 lbs Starting date: 02/09/2018 Today's weight: 229 lbs Today's date: 02/08/2021 Total lbs lost to date: 52 Total lbs lost since last in-office visit: 0  Interim History: Cheryl [name] has done well maintaining her weight. She is trying to do more water activity to stay active. She is mindful of her portions and she is working on more [employer]".  Subjective:   1. Essential hypertension Cheryl [name]'s blood pressure is well controlled on her medications and with diet. She denies lightheadedness.  2. Osteoarthritis of both knees, unspecified osteoarthritis type Cheryl [name] recently had knee injections and she is trying to stay active. She was told she may have to have her knees replaced eventually.  Assessment/Plan:   1. Essential hypertension Cheryl [name] will continue with diet, exercise, and her medications to improve blood pressure control. We will watch for signs of hypotension as she continues her lifestyle modifications.  2. Osteoarthritis of both knees, unspecified osteoarthritis type Cheryl [name] will continue working on diet and weight loss to maximize healing especially if she has surgery.    3. Obesity with current BMI 37.1 Cheryl [name] is currently in the action stage of change. As such, her goal is to continue with weight loss efforts. She has agreed to keeping a food journal and adhering to recommended goals of 1200-1400 calories and 70+ grams of protein daily.   More fruit options was discussed as well as lean meat equivalents.  Behavioral modification  strategies: increasing lean protein intake.  Cheryl [name] has agreed to follow-up with our clinic in 4 weeks. She was informed of the importance of frequent follow-up visits to maximize her success with intensive lifestyle modifications for her multiple health conditions.   Objective:   Blood pressure 122/84, pulse 83, temperature 97.8 F (36.6 C), height 5\' 6"  (1.676 m), weight 229 lb (103.9 kg), SpO2 97 %. Body mass index is 36.96 kg/m.  General: Cooperative, alert, well developed, in no acute distress. HEENT: Conjunctivae and lids unremarkable. Cardiovascular: Regular rhythm.  Lungs: Normal work of breathing. Neurologic: No focal deficits.   Lab Results  Component Value Date   CREATININE 0.88 10/14/2020   BUN 29 (H) 10/14/2020   NA 142 10/14/2020   K 5.0 10/14/2020   CL 104 10/14/2020   CO2 21 10/14/2020   Lab Results  Component Value Date   ALT 14 10/14/2020   AST 15 10/14/2020   ALKPHOS 100 10/14/2020   BILITOT 0.3 10/14/2020   Lab Results  Component Value Date   HGBA1C 5.2 10/14/2020   HGBA1C 5.2 06/10/2020   HGBA1C 5.4 12/15/2019   HGBA1C 5.5 07/15/2019   HGBA1C 5.3 03/18/2019   Lab Results  Component Value Date   INSULIN 5.5 10/14/2020   INSULIN 5.4 06/10/2020   INSULIN 5.0 12/15/2019   INSULIN 8.0 07/15/2019   INSULIN 13.0 03/18/2019   Lab Results  Component Value Date   TSH 5.070 (H) 10/14/2020   Lab Results  Component Value Date   CHOL 212 (H) 10/14/2020   HDL 83 10/14/2020   LDLCALC 119 (H) 10/14/2020  TRIG 57 10/14/2020   CHOLHDL 3.1 09/27/2017   Lab Results  Component Value Date   VD25OH 52.4 10/14/2020   VD25OH 54.1 06/10/2020   VD25OH 59.7 12/15/2019   Lab Results  Component Value Date   WBC 5.9 03/18/2019   HGB 13.6 03/18/2019   HCT 41.6 03/18/2019   MCV 91 03/18/2019   PLT 263 06/08/2016   No results found for: IRON, TIBC, FERRITIN  Attestation Statements:   Reviewed by clinician on day of visit: allergies, medications,  problem list, medical history, surgical history, family history, social history, and previous encounter notes.  Time spent on visit including pre-visit chart review and post-visit care and charting was 32 minutes.    I, Trixie Dredge, am acting as transcriptionist for Dennard Nip, MD.  I have reviewed the above documentation for accuracy and completeness, and I agree with the above. -  Dennard Nip, MD

## 2021-02-21 ENCOUNTER — Other Ambulatory Visit (INDEPENDENT_AMBULATORY_CARE_PROVIDER_SITE_OTHER): Payer: Self-pay | Admitting: Family Medicine

## 2021-02-21 DIAGNOSIS — F3289 Other specified depressive episodes: Secondary | ICD-10-CM

## 2021-02-21 NOTE — Telephone Encounter (Signed)
Patient is requesting a refill of the following medications: Requested Prescriptions   Pending Prescriptions Disp Refills   buPROPion (WELLBUTRIN XL) 150 MG 24 hr tablet [Pharmacy Med Name: BUPROPION XL 150MG  TABLETS (24 H)] 30 tablet 0    Sig: TAKE 1 TABLET BY MOUTH DAILY.    Last office visit: 02/08/21 Date of last refill: 01/24/21 Last refill amount: 30 Follow up time period per chart: 4 week Next appt:03/08/21

## 2021-03-07 ENCOUNTER — Other Ambulatory Visit (INDEPENDENT_AMBULATORY_CARE_PROVIDER_SITE_OTHER): Payer: Self-pay | Admitting: Family Medicine

## 2021-03-07 DIAGNOSIS — F3289 Other specified depressive episodes: Secondary | ICD-10-CM

## 2021-03-07 DIAGNOSIS — I10 Essential (primary) hypertension: Secondary | ICD-10-CM

## 2021-03-07 DIAGNOSIS — M1711 Unilateral primary osteoarthritis, right knee: Secondary | ICD-10-CM

## 2021-03-07 DIAGNOSIS — E8881 Metabolic syndrome: Secondary | ICD-10-CM

## 2021-03-07 NOTE — Telephone Encounter (Signed)
Dr.Beasley 

## 2021-03-08 ENCOUNTER — Ambulatory Visit (INDEPENDENT_AMBULATORY_CARE_PROVIDER_SITE_OTHER): Payer: Federal, State, Local not specified - PPO | Admitting: Family Medicine

## 2021-03-08 ENCOUNTER — Encounter (INDEPENDENT_AMBULATORY_CARE_PROVIDER_SITE_OTHER): Payer: Self-pay | Admitting: Family Medicine

## 2021-03-08 ENCOUNTER — Other Ambulatory Visit: Payer: Self-pay

## 2021-03-08 VITALS — BP 129/84 | HR 70 | Temp 97.7°F | Ht 66.0 in | Wt 230.0 lb

## 2021-03-08 DIAGNOSIS — R7303 Prediabetes: Secondary | ICD-10-CM

## 2021-03-08 DIAGNOSIS — M1711 Unilateral primary osteoarthritis, right knee: Secondary | ICD-10-CM

## 2021-03-08 DIAGNOSIS — Z9189 Other specified personal risk factors, not elsewhere classified: Secondary | ICD-10-CM | POA: Diagnosis not present

## 2021-03-08 DIAGNOSIS — Z6841 Body Mass Index (BMI) 40.0 and over, adult: Secondary | ICD-10-CM

## 2021-03-08 DIAGNOSIS — E8881 Metabolic syndrome: Secondary | ICD-10-CM

## 2021-03-08 DIAGNOSIS — F3289 Other specified depressive episodes: Secondary | ICD-10-CM

## 2021-03-08 DIAGNOSIS — I10 Essential (primary) hypertension: Secondary | ICD-10-CM

## 2021-03-08 MED ORDER — BUPROPION HCL ER (XL) 150 MG PO TB24: 150.0000 mg | ORAL_TABLET | Freq: Every day | ORAL | 0 refills | Status: DC

## 2021-03-08 MED ORDER — MELOXICAM 15 MG PO TABS: ORAL_TABLET | ORAL | 0 refills | Status: DC

## 2021-03-08 MED ORDER — METFORMIN HCL 500 MG PO TABS: ORAL_TABLET | ORAL | 0 refills | Status: DC

## 2021-03-08 MED ORDER — LOSARTAN POTASSIUM 100 MG PO TABS: ORAL_TABLET | ORAL | 0 refills | Status: DC

## 2021-03-08 MED ORDER — VENLAFAXINE HCL 75 MG PO TABS: 75.0000 mg | ORAL_TABLET | Freq: Every day | ORAL | 0 refills | Status: DC

## 2021-03-09 ENCOUNTER — Encounter (INDEPENDENT_AMBULATORY_CARE_PROVIDER_SITE_OTHER): Payer: Self-pay | Admitting: Family Medicine

## 2021-03-11 NOTE — Progress Notes (Signed)
Chief Complaint:   OBESITY Cheryl [name] is here to discuss her progress with her obesity treatment plan along with follow-up of her obesity related diagnoses. Cheryl [name] is on keeping a food journal and adhering to recommended goals of 1200-1400 calories and 70+ grams of protein daily and states she is following her eating plan approximately 40% of the time. Cheryl [name] states she is walking and doing pool workout for 60 minutes 2 times per week.  Today's visit was #: 67 Starting weight: 281 lbs Starting date: 02/09/2018 Today's weight: 230 lbs Today's date: 03/08/2021 Total lbs lost to date: 51 Total lbs lost since last in-office visit: 0  Interim History: Cheryl [name] continues to work on diet and exercise but she is under a lot of stress at work. This should improve soon and she feels it will be easier to get back on track soon.  Subjective:   1. Essential hypertension Cheryl [name]'s blood pressure is stable on her medications. She continues to work on diet and weight loss to help control her blood pressure.  2. Osteoarthritis of right knee, unspecified osteoarthritis type Cheryl [name] is tolerating Mobic and she denies gastritis symptoms.  3. Pre-diabetes Cheryl [name] is stable on metformin, and she is working on diet and exercise. She denies nausea or vomiting.  4. Other depression with emotional eating Cheryl [name]'s mood is stable on her medications. She is dealing with a lot of stress, but she appears to be doing better overall with minimizing her emotional eating behaviors.  5. At risk for dehydration Cheryl [name] is at risk for dehydration due to inadequate water intake.  Assessment/Plan:   1. Essential hypertension Cheryl [name] will continue working on healthy weight loss and exercise to improve blood pressure control. We will refill losartan for 90 days with no refills.  - losartan (COZAAR) 100 MG tablet; TAKE 1 TABLET(100 MG) BY MOUTH DAILY  Dispense: 90 tablet; Refill: 0  2. Osteoarthritis of right knee,  unspecified osteoarthritis type Cheryl [name] will continue her medications, and we will refill Mobic for 1 month.  - meloxicam (MOBIC) 15 MG tablet; TAKE 1 TABLET(15 MG) BY MOUTH AT BEDTIME  Dispense: 30 tablet; Refill: 0  3. Pre-diabetes Cheryl [name] will continue to work on weight loss, exercise, and decreasing simple carbohydrates to help decrease the risk of diabetes. We will refill metformin for for 1 month.  - metFORMIN (GLUCOPHAGE) 500 MG tablet; TAKE 1 TABLET(500 MG) BY MOUTH TWICE DAILY WITH A MEAL  Dispense: 90 tablet; Refill: 0  4. Other depression with emotional eating Behavior modification techniques were discussed today to help Cheryl [name] deal with her emotional/non-hunger eating behaviors. We will refill Effexor and Wellbutrin XL for 1 month. Orders and follow up as documented in patient record.   - venlafaxine (EFFEXOR) 75 MG tablet; Take 1 tablet (75 mg total) by mouth daily.  Dispense: 30 tablet; Refill: 0 - buPROPion (WELLBUTRIN XL) 150 MG 24 hr tablet; Take 1 tablet (150 mg total) by mouth daily.  Dispense: 30 tablet; Refill: 0  5. At risk for dehydration Cheryl [name] was given approximately 15 minutes dehydration prevention counseling today. Cheryl [name] is at risk for dehydration due to weight loss and current medication(s). She was encouraged to hydrate and monitor fluid status to avoid dehydration as well as weight loss plateaus.   6. Obesity with current BMI 37.3 Cheryl [name] is currently in the action stage of change. As such, her goal is to continue with weight loss efforts. She has agreed to keeping a food journal and adhering to recommended goals of  1200-1400 calories and 70+ grams of protein daily.   Exercise goals: As is.  Behavioral modification strategies: increasing water intake, meal planning and cooking strategies, emotional eating strategies, and dealing with family or coworker sabotage.  Cheryl [name] has agreed to follow-up with our clinic in 3 to 4 weeks. She was informed of the importance  of frequent follow-up visits to maximize her success with intensive lifestyle modifications for her multiple health conditions.   Objective:   Blood pressure 129/84, pulse 70, temperature 97.7 F (36.5 C), height '5\' 6"'$  (1.676 m), weight 230 lb (104.3 kg), SpO2 98 %. Body mass index is 37.12 kg/m.  General: Cooperative, alert, well developed, in no acute distress. HEENT: Conjunctivae and lids unremarkable. Cardiovascular: Regular rhythm.  Lungs: Normal work of breathing. Neurologic: No focal deficits.   Lab Results  Component Value Date   CREATININE 0.88 10/14/2020   BUN 29 (H) 10/14/2020   NA 142 10/14/2020   K 5.0 10/14/2020   CL 104 10/14/2020   CO2 21 10/14/2020   Lab Results  Component Value Date   ALT 14 10/14/2020   AST 15 10/14/2020   ALKPHOS 100 10/14/2020   BILITOT 0.3 10/14/2020   Lab Results  Component Value Date   HGBA1C 5.2 10/14/2020   HGBA1C 5.2 06/10/2020   HGBA1C 5.4 12/15/2019   HGBA1C 5.5 07/15/2019   HGBA1C 5.3 03/18/2019   Lab Results  Component Value Date   INSULIN 5.5 10/14/2020   INSULIN 5.4 06/10/2020   INSULIN 5.0 12/15/2019   INSULIN 8.0 07/15/2019   INSULIN 13.0 03/18/2019   Lab Results  Component Value Date   TSH 5.070 (H) 10/14/2020   Lab Results  Component Value Date   CHOL 212 (H) 10/14/2020   HDL 83 10/14/2020   LDLCALC 119 (H) 10/14/2020   TRIG 57 10/14/2020   CHOLHDL 3.1 09/27/2017   Lab Results  Component Value Date   VD25OH 52.4 10/14/2020   VD25OH 54.1 06/10/2020   VD25OH 59.7 12/15/2019   Lab Results  Component Value Date   WBC 5.9 03/18/2019   HGB 13.6 03/18/2019   HCT 41.6 03/18/2019   MCV 91 03/18/2019   PLT 263 06/08/2016   No results found for: IRON, TIBC, FERRITIN  Attestation Statements:   Reviewed by clinician on day of visit: allergies, medications, problem list, medical history, surgical history, family history, social history, and previous encounter notes.   I, Trixie Dredge, am acting as  transcriptionist for Dennard Nip, MD.  I have reviewed the above documentation for accuracy and completeness, and I agree with the above. -  Dennard Nip, MD

## 2021-03-28 ENCOUNTER — Other Ambulatory Visit (INDEPENDENT_AMBULATORY_CARE_PROVIDER_SITE_OTHER): Payer: Self-pay | Admitting: Family Medicine

## 2021-03-28 DIAGNOSIS — F3289 Other specified depressive episodes: Secondary | ICD-10-CM

## 2021-03-28 NOTE — Telephone Encounter (Signed)
Patient has enough till her appt 04/05/21

## 2021-03-28 NOTE — Telephone Encounter (Signed)
Pt last seen by Dr. Beasley.  

## 2021-04-05 ENCOUNTER — Other Ambulatory Visit (INDEPENDENT_AMBULATORY_CARE_PROVIDER_SITE_OTHER): Payer: Self-pay | Admitting: Family Medicine

## 2021-04-05 ENCOUNTER — Other Ambulatory Visit: Payer: Self-pay

## 2021-04-05 ENCOUNTER — Encounter (INDEPENDENT_AMBULATORY_CARE_PROVIDER_SITE_OTHER): Payer: Self-pay | Admitting: Family Medicine

## 2021-04-05 ENCOUNTER — Ambulatory Visit (INDEPENDENT_AMBULATORY_CARE_PROVIDER_SITE_OTHER): Payer: Federal, State, Local not specified - PPO | Admitting: Family Medicine

## 2021-04-05 VITALS — BP 123/80 | HR 88 | Temp 98.0°F | Ht 66.0 in | Wt 229.0 lb

## 2021-04-05 DIAGNOSIS — M1711 Unilateral primary osteoarthritis, right knee: Secondary | ICD-10-CM

## 2021-04-05 DIAGNOSIS — Z9189 Other specified personal risk factors, not elsewhere classified: Secondary | ICD-10-CM

## 2021-04-05 DIAGNOSIS — I10 Essential (primary) hypertension: Secondary | ICD-10-CM | POA: Diagnosis not present

## 2021-04-05 DIAGNOSIS — Z6841 Body Mass Index (BMI) 40.0 and over, adult: Secondary | ICD-10-CM

## 2021-04-05 DIAGNOSIS — R7303 Prediabetes: Secondary | ICD-10-CM

## 2021-04-05 DIAGNOSIS — F3289 Other specified depressive episodes: Secondary | ICD-10-CM | POA: Diagnosis not present

## 2021-04-05 MED ORDER — LOSARTAN POTASSIUM 100 MG PO TABS: ORAL_TABLET | ORAL | 0 refills | Status: DC

## 2021-04-05 MED ORDER — BUPROPION HCL ER (XL) 150 MG PO TB24: 150.0000 mg | ORAL_TABLET | Freq: Every day | ORAL | 0 refills | Status: DC

## 2021-04-05 MED ORDER — MELOXICAM 15 MG PO TABS: ORAL_TABLET | ORAL | 0 refills | Status: DC

## 2021-04-05 NOTE — Telephone Encounter (Signed)
Will refill at todays appt

## 2021-04-05 NOTE — Telephone Encounter (Signed)
Pt last seen by Dr. Beasley.  

## 2021-04-06 NOTE — Progress Notes (Signed)
Chief Complaint:   OBESITY Cheryl [name] is here to discuss her progress with her obesity treatment plan along with follow-up of her obesity related diagnoses. Cheryl [name] is on keeping a food journal and adhering to recommended goals of 1200-1400 calories and 70+ grams of protein daily and states she is following her eating plan approximately 25% of the time. Cheryl [name] states she is walking for 30 minutes 2 times per week.  Today's visit was #: 89 Starting weight: 281 lbs Starting date: 02/09/2018 Today's weight: 229 lbs Today's date: 04/05/2021 Total lbs lost to date: 52 Total lbs lost since last in-office visit: 1  Interim History: Cheryl [name] continues to do well with weight loss. There has been a lot of family stress and this has affected her emotional eating both with increased snacking and skipping some meals.  Subjective:   1. Essential hypertension Cheryl [name]'s blood pressure is well controlled on her medications, and with diet and exercise.  2. Osteoarthritis of right knee, unspecified osteoarthritis type Cheryl [name]'s pain is stable on Mobic. She takes this with food and she denies abdominal pain.  3. Other depression with emotional eating Cheryl [name] is stable on her medications. No blood pressure elevation, and she is very mindful of her emotional eating behaviors.  4. At risk for heart disease Cheryl [name] is at a higher than average risk for cardiovascular disease due to obesity.   Assessment/Plan:   1. Essential hypertension Cheryl [name] will continue working on healthy weight loss and exercise to improve blood pressure control. We will refill losartan for 1 month, and she will watch for signs of hypotension as she continues her lifestyle modifications.  - losartan (COZAAR) 100 MG tablet; TAKE 1 TABLET(100 MG) BY MOUTH DAILY  Dispense: 30 tablet; Refill: 0  2. Osteoarthritis of right knee, unspecified osteoarthritis type Cheryl [name] will continue her medications, and we will refill Mobic for 1 month.  -  meloxicam (MOBIC) 15 MG tablet; TAKE 1 TABLET(15 MG) BY MOUTH AT BEDTIME  Dispense: 30 tablet; Refill: 0  3. Other depression with emotional eating Behavior modification techniques were discussed today to help Cheryl [name] deal with her emotional/non-hunger eating behaviors. We will refill Wellbutrin XL for 1 month. Orders and follow up as documented in patient record.   - buPROPion (WELLBUTRIN XL) 150 MG 24 hr tablet; Take 1 tablet (150 mg total) by mouth daily.  Dispense: 30 tablet; Refill: 0  4. At risk for heart disease Cheryl [name] was given approximately 15 minutes of coronary artery disease prevention counseling today. She is 65 y.o. female and has risk factors for heart disease including obesity. We discussed intensive lifestyle modifications today with an emphasis on specific weight loss instructions and strategies.   Repetitive spaced learning was employed today to elicit superior memory formation and behavioral change.  5. Obesity with current BMI 37.1 Cheryl [name] is currently in the action stage of change. As such, her goal is to continue with weight loss efforts. She has agreed to keeping a food journal and adhering to recommended goals of 1200-1400 calories and 70+ grams of protein daily.   Exercise goals: As is.  Behavioral modification strategies: increasing lean protein intake.  Cheryl [name] has agreed to follow-up with our clinic in 4 weeks. She was informed of the importance of frequent follow-up visits to maximize her success with intensive lifestyle modifications for her multiple health conditions.   Objective:   Blood pressure 123/80, pulse 88, temperature 98 F (36.7 C), height '5\' 6"'$  (1.676 m), weight 229 lb (103.9 kg), SpO2 98 %.  Body mass index is 36.96 kg/m.  General: Cooperative, alert, well developed, in no acute distress. HEENT: Conjunctivae and lids unremarkable. Cardiovascular: Regular rhythm.  Lungs: Normal work of breathing. Neurologic: No focal deficits.   Lab Results   Component Value Date   CREATININE 0.88 10/14/2020   BUN 29 (H) 10/14/2020   NA 142 10/14/2020   K 5.0 10/14/2020   CL 104 10/14/2020   CO2 21 10/14/2020   Lab Results  Component Value Date   ALT 14 10/14/2020   AST 15 10/14/2020   ALKPHOS 100 10/14/2020   BILITOT 0.3 10/14/2020   Lab Results  Component Value Date   HGBA1C 5.2 10/14/2020   HGBA1C 5.2 06/10/2020   HGBA1C 5.4 12/15/2019   HGBA1C 5.5 07/15/2019   HGBA1C 5.3 03/18/2019   Lab Results  Component Value Date   INSULIN 5.5 10/14/2020   INSULIN 5.4 06/10/2020   INSULIN 5.0 12/15/2019   INSULIN 8.0 07/15/2019   INSULIN 13.0 03/18/2019   Lab Results  Component Value Date   TSH 5.070 (H) 10/14/2020   Lab Results  Component Value Date   CHOL 212 (H) 10/14/2020   HDL 83 10/14/2020   LDLCALC 119 (H) 10/14/2020   TRIG 57 10/14/2020   CHOLHDL 3.1 09/27/2017   Lab Results  Component Value Date   VD25OH 52.4 10/14/2020   VD25OH 54.1 06/10/2020   VD25OH 59.7 12/15/2019   Lab Results  Component Value Date   WBC 5.9 03/18/2019   HGB 13.6 03/18/2019   HCT 41.6 03/18/2019   MCV 91 03/18/2019   PLT 263 06/08/2016   No results found for: IRON, TIBC, FERRITIN  Attestation Statements:   Reviewed by clinician on day of visit: allergies, medications, problem list, medical history, surgical history, family history, social history, and previous encounter notes.   I, Trixie Dredge, am acting as transcriptionist for Dennard Nip, MD.  I have reviewed the above documentation for accuracy and completeness, and I agree with the above. -  Dennard Nip, MD

## 2021-04-28 ENCOUNTER — Encounter: Payer: Self-pay | Admitting: Gastroenterology

## 2021-05-02 ENCOUNTER — Other Ambulatory Visit (INDEPENDENT_AMBULATORY_CARE_PROVIDER_SITE_OTHER): Payer: Self-pay | Admitting: Family Medicine

## 2021-05-02 DIAGNOSIS — R7303 Prediabetes: Secondary | ICD-10-CM

## 2021-05-02 NOTE — Telephone Encounter (Signed)
Pt last seen by Dr. Beasley.  

## 2021-05-03 ENCOUNTER — Ambulatory Visit (INDEPENDENT_AMBULATORY_CARE_PROVIDER_SITE_OTHER): Payer: Federal, State, Local not specified - PPO | Admitting: Family Medicine

## 2021-05-03 ENCOUNTER — Encounter (INDEPENDENT_AMBULATORY_CARE_PROVIDER_SITE_OTHER): Payer: Self-pay | Admitting: Family Medicine

## 2021-05-03 ENCOUNTER — Other Ambulatory Visit: Payer: Self-pay

## 2021-05-03 DIAGNOSIS — Z6841 Body Mass Index (BMI) 40.0 and over, adult: Secondary | ICD-10-CM

## 2021-05-03 DIAGNOSIS — I10 Essential (primary) hypertension: Secondary | ICD-10-CM

## 2021-05-03 DIAGNOSIS — M1711 Unilateral primary osteoarthritis, right knee: Secondary | ICD-10-CM

## 2021-05-03 DIAGNOSIS — F3289 Other specified depressive episodes: Secondary | ICD-10-CM

## 2021-05-03 DIAGNOSIS — R7303 Prediabetes: Secondary | ICD-10-CM

## 2021-05-03 MED ORDER — MELOXICAM 15 MG PO TABS: ORAL_TABLET | ORAL | 0 refills | Status: DC

## 2021-05-03 MED ORDER — VENLAFAXINE HCL 75 MG PO TABS: 75.0000 mg | ORAL_TABLET | Freq: Every day | ORAL | 0 refills | Status: DC

## 2021-05-03 MED ORDER — LOSARTAN POTASSIUM 100 MG PO TABS: ORAL_TABLET | ORAL | 0 refills | Status: DC

## 2021-05-03 MED ORDER — BUPROPION HCL ER (XL) 150 MG PO TB24: 150.0000 mg | ORAL_TABLET | Freq: Every day | ORAL | 0 refills | Status: DC

## 2021-05-03 NOTE — Progress Notes (Signed)
Chief Complaint:   OBESITY Cheryl [name] is here to discuss her progress with her obesity treatment plan along with follow-up of her obesity related diagnoses. Cheryl [name] is on keeping a food journal and adhering to recommended goals of 1200-1400 calories and 70+ grams of protein daily and states she is following her eating plan approximately 25% of the time. Cheryl [name] states she is walking for 20 minutes 1 time per week.  Today's visit was #: 47 Starting weight: 281 lbs Starting date: 02/09/2018 Today's weight: 229 lbs Today's date: 05/03/2021 Total lbs lost to date: 52 Total lbs lost since last in-office visit: 0  Interim History: Cheryl [name] continues to maintain her weight loss. She is having a lot of family stress, but it appears she will be working more reasonable hours at work which should help decrease her stress.  Subjective:   1. Essential hypertension Cheryl [name]'s blood pressure is stable on her medications, and she is working on diet and weight loss.  2. Osteoarthritis of right knee, unspecified osteoarthritis type Cheryl [name] is stable on Mobic and she needs a refill today.  3. Other depression with emotional eating Cheryl [name]'s mood is stable on her medications. She is dealing with her stress reasonably well.  Assessment/Plan:   1. Essential hypertension Cheryl [name] will continue working on healthy weight loss and exercise to improve blood pressure control. We will refill losartan for 1 month, and she will watch for signs of hypotension as she continues her lifestyle modifications.  - losartan (COZAAR) 100 MG tablet; TAKE 1 TABLET(100 MG) BY MOUTH DAILY  Dispense: 30 tablet; Refill: 0  2. Osteoarthritis of right knee, unspecified osteoarthritis type We will refill Mobic for 1 month, and we will follow up at [facility] next visit.  - meloxicam (MOBIC) 15 MG tablet; TAKE 1 TABLET(15 MG) BY MOUTH AT BEDTIME  Dispense: 30 tablet; Refill: 0  3. Other depression with emotional eating Behavior  modification techniques were discussed today to help Cheryl [name] deal with her emotional/non-hunger eating behaviors. We will refill Wellbutrin XL and Effexor for 1 month. Orders and follow up as documented in patient record.   - buPROPion (WELLBUTRIN XL) 150 MG 24 hr tablet; Take 1 tablet (150 mg total) by mouth daily.  Dispense: 30 tablet; Refill: 0 - venlafaxine (EFFEXOR) 75 MG tablet; Take 1 tablet (75 mg total) by mouth daily.  Dispense: 30 tablet; Refill: 0  4. Obesity with current BMI 37.0 Cheryl [name] is currently in the action stage of change. As such, her goal is to continue with weight loss efforts. She has agreed to keeping a food journal and adhering to recommended goals of 1200-1400 calories and 75+ grams of protein daily.   Exercise goals: As is.  Behavioral modification strategies: increasing lean protein intake and meal planning and cooking strategies.  Cheryl [name] has agreed to follow-up with our clinic in 3 to 4 weeks. She was informed of the importance of frequent follow-up visits to maximize her success with intensive lifestyle modifications for her multiple health conditions.   Objective:   Blood pressure 136/79, pulse 71, temperature 97.9 F (36.6 C), height 5\' 6"  (1.676 m), weight 229 lb (103.9 kg), SpO2 96 %. Body mass index is 36.96 kg/m.  General: Cooperative, alert, well developed, in no acute distress. HEENT: Conjunctivae and lids unremarkable. Cardiovascular: Regular rhythm.  Lungs: Normal work of breathing. Neurologic: No focal deficits.   Lab Results  Component Value Date   CREATININE 0.88 10/14/2020   BUN 29 (H) 10/14/2020   NA 142 10/14/2020  K 5.0 10/14/2020   CL 104 10/14/2020   CO2 21 10/14/2020   Lab Results  Component Value Date   ALT 14 10/14/2020   AST 15 10/14/2020   ALKPHOS 100 10/14/2020   BILITOT 0.3 10/14/2020   Lab Results  Component Value Date   HGBA1C 5.2 10/14/2020   HGBA1C 5.2 06/10/2020   HGBA1C 5.4 12/15/2019   HGBA1C 5.5  07/15/2019   HGBA1C 5.3 03/18/2019   Lab Results  Component Value Date   INSULIN 5.5 10/14/2020   INSULIN 5.4 06/10/2020   INSULIN 5.0 12/15/2019   INSULIN 8.0 07/15/2019   INSULIN 13.0 03/18/2019   Lab Results  Component Value Date   TSH 5.070 (H) 10/14/2020   Lab Results  Component Value Date   CHOL 212 (H) 10/14/2020   HDL 83 10/14/2020   LDLCALC 119 (H) 10/14/2020   TRIG 57 10/14/2020   CHOLHDL 3.1 09/27/2017   Lab Results  Component Value Date   VD25OH 52.4 10/14/2020   VD25OH 54.1 06/10/2020   VD25OH 59.7 12/15/2019   Lab Results  Component Value Date   WBC 5.9 03/18/2019   HGB 13.6 03/18/2019   HCT 41.6 03/18/2019   MCV 91 03/18/2019   PLT 263 06/08/2016   No results found for: IRON, TIBC, FERRITIN  Attestation Statements:   Reviewed by clinician on day of visit: allergies, medications, problem list, medical history, surgical history, family history, social history, and previous encounter notes.   I, Trixie Dredge, am acting as transcriptionist for Dennard Nip, MD.  I have reviewed the above documentation for accuracy and completeness, and I agree with the above. -  Dennard Nip, MD

## 2021-05-07 ENCOUNTER — Other Ambulatory Visit (INDEPENDENT_AMBULATORY_CARE_PROVIDER_SITE_OTHER): Payer: Self-pay | Admitting: Family Medicine

## 2021-05-07 DIAGNOSIS — M1711 Unilateral primary osteoarthritis, right knee: Secondary | ICD-10-CM

## 2021-05-07 DIAGNOSIS — F3289 Other specified depressive episodes: Secondary | ICD-10-CM

## 2021-05-09 NOTE — Telephone Encounter (Signed)
Pt last seen by Dr. Beasley.  

## 2021-05-10 NOTE — Telephone Encounter (Signed)
LAST APPOINTMENT DATE: 05/03/21 NEXT APPOINTMENT DATE: 05/31/21   [id number] [place], [place] - [address] [place] [address] Phone: [phone] Fax: [phone]  Patient is requesting a refill of the following medications: Requested Prescriptions   Pending Prescriptions Disp Refills   meloxicam (MOBIC) 15 MG tablet [Pharmacy Med Name: MELOXICAM 15MG  TABLETS] 30 tablet 0    Sig: TAKE 1 TABLET(15 MG) BY MOUTH AT BEDTIME   buPROPion (WELLBUTRIN XL) 150 MG 24 hr tablet [Pharmacy Med Name: BUPROPION XL 150MG  TABLETS (24 H)] 30 tablet 0    Sig: TAKE 1 TABLET(150 MG) BY MOUTH DAILY    Date last filled: 05/03/21 Previously prescribed by [facility]  Lab Results  Component Value Date   HGBA1C 5.2 10/14/2020   HGBA1C 5.2 06/10/2020   HGBA1C 5.4 12/15/2019   Lab Results  Component Value Date   MICROALBUR 0.4 09/23/2014   LDLCALC 119 (H) 10/14/2020   CREATININE 0.88 10/14/2020   Lab Results  Component Value Date   VD25OH 52.4 10/14/2020   VD25OH 54.1 06/10/2020   VD25OH 59.7 12/15/2019    BP Readings from Last 3 Encounters:  05/03/21 136/79  04/05/21 123/80  03/08/21 129/84

## 2021-05-19 ENCOUNTER — Telehealth: Payer: Self-pay | Admitting: Emergency Medicine

## 2021-05-19 DIAGNOSIS — R519 Headache, unspecified: Secondary | ICD-10-CM

## 2021-05-19 DIAGNOSIS — U071 COVID-19: Secondary | ICD-10-CM

## 2021-05-19 DIAGNOSIS — R5383 Other fatigue: Secondary | ICD-10-CM

## 2021-05-19 MED ORDER — BENZONATATE 100 MG PO CAPS: 100.0000 mg | ORAL_CAPSULE | Freq: Two times a day (BID) | ORAL | 0 refills | Status: DC | PRN

## 2021-05-19 NOTE — Progress Notes (Signed)
Virtual Visit Consent   Cheryl TREESE, you are scheduled for a virtual visit with a [facility] provider today.     Just as with appointments in the office, your consent must be obtained to participate.  Your consent will be active for this visit and any virtual visit you may have with one of our providers in the next 365 days.     If you have a MyChart account, a copy of this consent can be sent to you electronically.  All virtual visits are billed to your insurance company just like a traditional visit in the office.    As this is a virtual visit, video technology does not allow for your provider to perform a traditional examination.  This may limit your provider's ability to fully assess your condition.  If your provider identifies any concerns that need to be evaluated in person or the need to arrange testing (such as labs, EKG, etc.), we will make arrangements to do so.     Although advances in technology are sophisticated, we cannot ensure that it will always work on either your end or our end.  If the connection with a video visit is poor, the visit may have to be switched to a telephone visit.  With either a video or telephone visit, we are not always able to ensure that we have a secure connection.     I need to obtain your verbal consent now.   Are you willing to proceed with your visit today?    HARRY BARK has provided verbal consent on 05/19/2021 for a virtual visit (video or telephone).   Cheryl [name], [place]   Date: 05/19/2021 5:46 PM   Virtual Visit via Video Note   I, Cheryl [name], connected with  Cheryl [name]  ([id number], [birth year]) on 05/19/21 at  5:45 PM EDT by a video-enabled telemedicine application and verified that I am speaking with the correct person using two identifiers.  Location: Patient: Virtual Visit Location Patient: Home Provider: Virtual Visit Location Provider: Home Office   I discussed the limitations of evaluation and management by  telemedicine and the availability of in person appointments. The patient expressed understanding and agreed to proceed.    History of Present Illness: Cheryl [name] is a 65 y.o. who identifies as a female who was assigned female at birth, and is being seen today for headache, and fatigue x 1.5 days, also mentions runny nose and congestion for the past week.  Mother and father tested positive for covid.  Currently in the hospital.  Has tried mucinex with relief.  Denies aggravating factors.  Denies previous covid infection.   Denies fever, chills, sore throat, SOB, wheezing, chest pain, nausea, changes in bowel or bladder habits.    ROS: As per HPI.  All other pertinent ROS negative.      Problems:  Patient Active Problem List   Diagnosis Date Noted   Depression 04/10/2019   Insulin resistance 04/09/2019   TSH elevation 04/09/2019   Other fatigue 02/06/2018   Shortness of breath on exertion 02/06/2018   Essential hypertension 02/06/2018   Sleep apnea    Urinary incontinence    HTN (hypertension) 01/05/2012   Osteopenia    Chest pain, central 01/06/2011   Obesity     Allergies:  Allergies  Allergen Reactions   Ace Inhibitors Cough   Lisinopril Cough   Neosporin [Neomycin-Bacitracin Zn-Polymyx] Rash   Z-Pak [Azithromycin] Other (See Comments)    thrush   Medications:  Current Outpatient Medications:    aspirin 81 MG chewable tablet, Chew 81 mg by mouth daily., Disp: , Rfl:    buPROPion (WELLBUTRIN XL) 150 MG 24 hr tablet, Take 1 tablet (150 mg total) by mouth daily., Disp: 30 tablet, Rfl: 0   Cholecalciferol (VITAMIN D3) 1000 UNITS CAPS, Take 1 capsule by mouth daily. , Disp: , Rfl:    docusate sodium (COLACE) 100 MG capsule, Take 100 mg by mouth daily., Disp: , Rfl:    Loratadine (CLARITIN PO), Take 1 tablet by mouth daily. , Disp: , Rfl:    losartan (COZAAR) 100 MG tablet, TAKE 1 TABLET(100 MG) BY MOUTH DAILY, Disp: 30 tablet, Rfl: 0   meloxicam (MOBIC) 15 MG tablet, TAKE 1  TABLET(15 MG) BY MOUTH AT BEDTIME, Disp: 30 tablet, Rfl: 0   metFORMIN (GLUCOPHAGE) 500 MG tablet, TAKE 1 TABLET(500 MG) BY MOUTH TWICE DAILY WITH A MEAL, Disp: 90 tablet, Rfl: 0   oxybutynin (DITROPAN-XL) 5 MG 24 hr tablet, TAKE 1 TABLET BY MOUTH AT BEDTIME, Disp: 90 tablet, Rfl: 2   UNABLE TO FIND, C PAP for sleep apnea , Disp: , Rfl:    venlafaxine (EFFEXOR) 75 MG tablet, Take 1 tablet (75 mg total) by mouth daily., Disp: 30 tablet, Rfl: 0  Observations/Objective: Patient is well-developed, well-nourished in no acute distress.  Resting comfortably at home.  Head is normocephalic, atraumatic.  No labored breathing. Speaking in full sentences and tolerating own secretions Speech is clear and coherent with logical content.  Patient is alert and oriented at baseline.   Assessment and Plan: 1. COVID-19 virus infection  2. Other fatigue  3. Acute nonintractable headache, unspecified headache type  COVID test was positive You should remain isolated in your home for 5 days from symptom onset AND greater than 72 hours after symptoms resolution (absence of fever without the use of fever-reducing medication and improvement in respiratory symptoms), whichever is longer Get plenty of rest and push fluids Use zyrtec for nasal congestion, runny nose, and/or sore throat Use flonase for nasal congestion and runny nose Use medications daily for symptom relief Use OTC medications like ibuprofen or tylenol as needed fever or pain Tessalon perles prescribed as needed for cough Follow up with PCP in 1-2 days via phone or e-visit for recheck and to ensure symptoms are improving Call, follow up in person at urgent care, or go to the ED if you have any new or worsening symptoms such as fever, worsening cough, shortness of breath, chest tightness, chest pain, turning blue, changes in mental status, etc...    Follow Up Instructions: I discussed the assessment and treatment plan with the patient. The patient  was provided an opportunity to ask questions and all were answered. The patient agreed with the plan and demonstrated an understanding of the instructions.  A copy of instructions were sent to the patient via MyChart unless otherwise noted below.   The patient was advised to call back or seek an in-person evaluation if the symptoms worsen or if the condition fails to improve as anticipated.  Time:  I spent 10 minutes with the patient via telehealth technology discussing the above problems/concerns.    Cheryl Box, PA-C

## 2021-05-19 NOTE — Patient Instructions (Signed)
Cheryl [name], thank you for joining [name], PA-C for today's virtual visit.  While this provider is not your primary care provider (PCP), if your PCP is located in our provider database this encounter information will be shared with them immediately following your visit.  Consent: (Patient) Cheryl [name] provided verbal consent for this virtual visit at the beginning of the encounter.  Current Medications:  Current Outpatient Medications:    aspirin 81 MG chewable tablet, Chew 81 mg by mouth daily., Disp: , Rfl:    buPROPion (WELLBUTRIN XL) 150 MG 24 hr tablet, Take 1 tablet (150 mg total) by mouth daily., Disp: 30 tablet, Rfl: 0   Cholecalciferol (VITAMIN D3) 1000 UNITS CAPS, Take 1 capsule by mouth daily. , Disp: , Rfl:    docusate sodium (COLACE) 100 MG capsule, Take 100 mg by mouth daily., Disp: , Rfl:    Loratadine (CLARITIN PO), Take 1 tablet by mouth daily. , Disp: , Rfl:    losartan (COZAAR) 100 MG tablet, TAKE 1 TABLET(100 MG) BY MOUTH DAILY, Disp: 30 tablet, Rfl: 0   meloxicam (MOBIC) 15 MG tablet, TAKE 1 TABLET(15 MG) BY MOUTH AT BEDTIME, Disp: 30 tablet, Rfl: 0   metFORMIN (GLUCOPHAGE) 500 MG tablet, TAKE 1 TABLET(500 MG) BY MOUTH TWICE DAILY WITH A MEAL, Disp: 90 tablet, Rfl: 0   oxybutynin (DITROPAN-XL) 5 MG 24 hr tablet, TAKE 1 TABLET BY MOUTH AT BEDTIME, Disp: 90 tablet, Rfl: 2   UNABLE TO FIND, C PAP for sleep apnea , Disp: , Rfl:    venlafaxine (EFFEXOR) 75 MG tablet, Take 1 tablet (75 mg total) by mouth daily., Disp: 30 tablet, Rfl: 0   Medications ordered in this encounter:  No orders of the defined types were placed in this encounter.    *If you need refills on other medications prior to your next appointment, please contact your pharmacy*  Follow-Up: Call back or seek an in-person evaluation if the symptoms worsen or if the condition fails to improve as anticipated.  Other Instructions COVID test was positive You should remain isolated in your  home for 5 days from symptom onset AND greater than 72 hours after symptoms resolution (absence of fever without the use of fever-reducing medication and improvement in respiratory symptoms), whichever is longer Get plenty of rest and push fluids Use zyrtec for nasal congestion, runny nose, and/or sore throat Use flonase for nasal congestion and runny nose Use medications daily for symptom relief Use OTC medications like ibuprofen or tylenol as needed fever or pain Tessalon perles prescribed as needed for cough Follow up with PCP in 1-2 days via phone or e-visit for recheck and to ensure symptoms are improving Call, follow up in person at urgent care, or go to the ED if you have any new or worsening symptoms such as fever, worsening cough, shortness of breath, chest tightness, chest pain, turning blue, changes in mental status, etc...     If you have been instructed to have an in-person evaluation today at a local Urgent Care facility, please use the link below. It will take you to a list of all of our available [facility], including address, phone number and hours of operation. Please do not delay care.  [facility]  If you or a family member do not have a primary care provider, use the link below to schedule a visit and establish care. When you choose a  primary care physician or advanced practice provider, you  gain a long-term partner in health. Find a Primary Care Provider  Learn more about [facility]'s in-office and virtual care options: [facility]

## 2021-05-24 ENCOUNTER — Ambulatory Visit: Payer: Self-pay

## 2021-05-31 ENCOUNTER — Ambulatory Visit (INDEPENDENT_AMBULATORY_CARE_PROVIDER_SITE_OTHER): Payer: Federal, State, Local not specified - PPO | Admitting: Family Medicine

## 2021-05-31 ENCOUNTER — Encounter (INDEPENDENT_AMBULATORY_CARE_PROVIDER_SITE_OTHER): Payer: Self-pay | Admitting: Family Medicine

## 2021-05-31 ENCOUNTER — Other Ambulatory Visit: Payer: Self-pay

## 2021-05-31 VITALS — BP 118/87 | HR 88 | Temp 98.4°F | Ht 66.0 in | Wt 228.0 lb

## 2021-05-31 DIAGNOSIS — M1711 Unilateral primary osteoarthritis, right knee: Secondary | ICD-10-CM | POA: Diagnosis not present

## 2021-05-31 DIAGNOSIS — Z6841 Body Mass Index (BMI) 40.0 and over, adult: Secondary | ICD-10-CM

## 2021-05-31 DIAGNOSIS — I10 Essential (primary) hypertension: Secondary | ICD-10-CM

## 2021-05-31 DIAGNOSIS — F3289 Other specified depressive episodes: Secondary | ICD-10-CM | POA: Diagnosis not present

## 2021-05-31 MED ORDER — VENLAFAXINE HCL 75 MG PO TABS: 75.0000 mg | ORAL_TABLET | Freq: Every day | ORAL | 0 refills | Status: DC

## 2021-05-31 MED ORDER — LOSARTAN POTASSIUM 100 MG PO TABS: ORAL_TABLET | ORAL | 0 refills | Status: DC

## 2021-05-31 MED ORDER — BUPROPION HCL ER (XL) 150 MG PO TB24: 150.0000 mg | ORAL_TABLET | Freq: Every day | ORAL | 0 refills | Status: DC

## 2021-05-31 MED ORDER — MELOXICAM 15 MG PO TABS: ORAL_TABLET | ORAL | 0 refills | Status: DC

## 2021-05-31 NOTE — Progress Notes (Signed)
Chief Complaint:   OBESITY Cheryl [name] is here to discuss her progress with her obesity treatment plan along with follow-up of her obesity related diagnoses. Cheryl [name] is on keeping a food journal and adhering to recommended goals of 1200-1400 calories and 75+ grams of protein daily and states she is following her eating plan approximately 10% of the time. Cheryl [name] states she is doing 0 minutes 0 times per week.  Today's visit was #: 5 Starting weight: 281 lbs Starting date: 02/09/2018 Today's weight: 228 lbs Today's date: 05/31/2021 Total lbs lost to date: 48 Total lbs lost since last in-office visit: 1  Interim History: Cheryl [name] has continued to work on weight loss, but she has had extra challenges recently with her parents developing COVID. She is recovering but she is still pretty fatigued.  Subjective:   1. Essential hypertension Cheryl [name]'s blood pressure is stable on her medications. She denies lightheadedness of headache.  2. Osteoarthritis of right knee, unspecified osteoarthritis type Cheryl [name] is stable on Mobic. Her exercise has decreased since feeling sick.  3. Other depression with emotional eating Cheryl [name] is stable on her medications. She notes increased teratoma worse after contracting COVID.  Assessment/Plan:   1. Essential hypertension Cheryl [name] will continue working on healthy weight loss and exercise to improve blood pressure control. We will refill losartan for 1 month. She will watch for signs of hypotension as she continues her lifestyle modifications.  - losartan (COZAAR) 100 MG tablet; TAKE 1 TABLET(100 MG) BY MOUTH DAILY  Dispense: 30 tablet; Refill: 0  2. Osteoarthritis of right knee, unspecified osteoarthritis type Cheryl [name] will continue Mobic and we will refill for 1 month. She will continue to follow up as directed.  - meloxicam (MOBIC) 15 MG tablet; TAKE 1 TABLET(15 MG) BY MOUTH AT BEDTIME  Dispense: 30 tablet; Refill: 0  3. Other depression with emotional  eating Behavior modification techniques were discussed today to help Cheryl [name] deal with her emotional/non-hunger eating behaviors. We will refill Wellbutrin XL and Effexor for 1 month. She is ok to use moisturizing mouth drops. Orders and follow up as documented in patient record.   - buPROPion (WELLBUTRIN XL) 150 MG 24 hr tablet; Take 1 tablet (150 mg total) by mouth daily.  Dispense: 30 tablet; Refill: 0 - venlafaxine (EFFEXOR) 75 MG tablet; Take 1 tablet (75 mg total) by mouth daily.  Dispense: 30 tablet; Refill: 0  4. Obesity with current BMI 36.8 Cheryl [name] is currently in the action stage of change. As such, her goal is to continue with weight loss efforts. She has agreed to keeping a food journal and adhering to recommended goals of 1200-1400 calories and 75+ grams of protein daily.   We will recheck fasting labs at her next visit.  Behavioral modification strategies: increasing water intake and travel eating strategies.  Cheryl [name] has agreed to follow-up with our clinic in 3 weeks. She was informed of the importance of frequent follow-up visits to maximize her success with intensive lifestyle modifications for her multiple health conditions.   Objective:   Blood pressure 118/87, pulse 88, temperature 98.4 F (36.9 C), height 5\' 6"  (1.676 m), weight 228 lb (103.4 kg), SpO2 97 %. Body mass index is 36.8 kg/m.  General: Cooperative, alert, well developed, in no acute distress. HEENT: Conjunctivae and lids unremarkable. Cardiovascular: Regular rhythm.  Lungs: Normal work of breathing. Neurologic: No focal deficits.   Lab Results  Component Value Date   CREATININE 0.88 10/14/2020   BUN 29 (H) 10/14/2020   NA 142  10/14/2020   K 5.0 10/14/2020   CL 104 10/14/2020   CO2 21 10/14/2020   Lab Results  Component Value Date   ALT 14 10/14/2020   AST 15 10/14/2020   ALKPHOS 100 10/14/2020   BILITOT 0.3 10/14/2020   Lab Results  Component Value Date   HGBA1C 5.2 10/14/2020   HGBA1C 5.2  06/10/2020   HGBA1C 5.4 12/15/2019   HGBA1C 5.5 07/15/2019   HGBA1C 5.3 03/18/2019   Lab Results  Component Value Date   INSULIN 5.5 10/14/2020   INSULIN 5.4 06/10/2020   INSULIN 5.0 12/15/2019   INSULIN 8.0 07/15/2019   INSULIN 13.0 03/18/2019   Lab Results  Component Value Date   TSH 5.070 (H) 10/14/2020   Lab Results  Component Value Date   CHOL 212 (H) 10/14/2020   HDL 83 10/14/2020   LDLCALC 119 (H) 10/14/2020   TRIG 57 10/14/2020   CHOLHDL 3.1 09/27/2017   Lab Results  Component Value Date   VD25OH 52.4 10/14/2020   VD25OH 54.1 06/10/2020   VD25OH 59.7 12/15/2019   Lab Results  Component Value Date   WBC 5.9 03/18/2019   HGB 13.6 03/18/2019   HCT 41.6 03/18/2019   MCV 91 03/18/2019   PLT 263 06/08/2016   No results found for: IRON, TIBC, FERRITIN  Attestation Statements:   Reviewed by clinician on day of visit: allergies, medications, problem list, medical history, surgical history, family history, social history, and previous encounter notes.   I, Trixie Dredge, am acting as transcriptionist for Dennard Nip, MD.  I have reviewed the above documentation for accuracy and completeness, and I agree with the above. -  Dennard Nip, MD

## 2021-06-03 ENCOUNTER — Other Ambulatory Visit (INDEPENDENT_AMBULATORY_CARE_PROVIDER_SITE_OTHER): Payer: Self-pay | Admitting: Family Medicine

## 2021-06-03 DIAGNOSIS — I10 Essential (primary) hypertension: Secondary | ICD-10-CM

## 2021-06-06 NOTE — Telephone Encounter (Signed)
Dr.Beasley 

## 2021-06-08 DIAGNOSIS — D225 Melanocytic nevi of trunk: Secondary | ICD-10-CM | POA: Diagnosis not present

## 2021-06-08 DIAGNOSIS — L814 Other melanin hyperpigmentation: Secondary | ICD-10-CM | POA: Diagnosis not present

## 2021-06-08 DIAGNOSIS — L821 Other seborrheic keratosis: Secondary | ICD-10-CM | POA: Diagnosis not present

## 2021-06-08 DIAGNOSIS — L578 Other skin changes due to chronic exposure to nonionizing radiation: Secondary | ICD-10-CM | POA: Diagnosis not present

## 2021-06-16 ENCOUNTER — Ambulatory Visit: Payer: Federal, State, Local not specified - PPO | Admitting: Nurse Practitioner

## 2021-06-27 ENCOUNTER — Ambulatory Visit: Payer: Federal, State, Local not specified - PPO | Admitting: Nurse Practitioner

## 2021-06-29 ENCOUNTER — Ambulatory Visit (INDEPENDENT_AMBULATORY_CARE_PROVIDER_SITE_OTHER): Payer: Federal, State, Local not specified - PPO | Admitting: Family Medicine

## 2021-06-29 ENCOUNTER — Other Ambulatory Visit: Payer: Self-pay

## 2021-06-29 ENCOUNTER — Encounter (INDEPENDENT_AMBULATORY_CARE_PROVIDER_SITE_OTHER): Payer: Self-pay | Admitting: Family Medicine

## 2021-06-29 VITALS — BP 133/84 | HR 88 | Temp 98.0°F | Ht 66.0 in | Wt 230.0 lb

## 2021-06-29 DIAGNOSIS — Z6841 Body Mass Index (BMI) 40.0 and over, adult: Secondary | ICD-10-CM

## 2021-06-29 DIAGNOSIS — E559 Vitamin D deficiency, unspecified: Secondary | ICD-10-CM

## 2021-06-29 DIAGNOSIS — R7303 Prediabetes: Secondary | ICD-10-CM

## 2021-06-29 DIAGNOSIS — M1711 Unilateral primary osteoarthritis, right knee: Secondary | ICD-10-CM | POA: Diagnosis not present

## 2021-06-29 DIAGNOSIS — E782 Mixed hyperlipidemia: Secondary | ICD-10-CM

## 2021-06-29 DIAGNOSIS — E8881 Metabolic syndrome: Secondary | ICD-10-CM | POA: Diagnosis not present

## 2021-06-29 DIAGNOSIS — I10 Essential (primary) hypertension: Secondary | ICD-10-CM

## 2021-06-29 DIAGNOSIS — R7989 Other specified abnormal findings of blood chemistry: Secondary | ICD-10-CM | POA: Diagnosis not present

## 2021-06-29 DIAGNOSIS — F3289 Other specified depressive episodes: Secondary | ICD-10-CM

## 2021-06-29 MED ORDER — METFORMIN HCL 500 MG PO TABS: ORAL_TABLET | ORAL | 0 refills | Status: DC

## 2021-06-29 MED ORDER — LOSARTAN POTASSIUM 100 MG PO TABS: ORAL_TABLET | ORAL | 0 refills | Status: DC

## 2021-06-29 MED ORDER — BUPROPION HCL ER (XL) 150 MG PO TB24: 150.0000 mg | ORAL_TABLET | Freq: Every day | ORAL | 0 refills | Status: DC

## 2021-06-29 MED ORDER — VENLAFAXINE HCL 75 MG PO TABS: 75.0000 mg | ORAL_TABLET | Freq: Every day | ORAL | 0 refills | Status: DC

## 2021-06-29 MED ORDER — MELOXICAM 15 MG PO TABS: ORAL_TABLET | ORAL | 0 refills | Status: DC

## 2021-06-29 NOTE — Progress Notes (Signed)
Chief Complaint:   OBESITY Cheryl [name] is here to discuss her progress with her obesity treatment plan along with follow-up of her obesity related diagnoses. [name] is on keeping a food journal and adhering to recommended goals of 1200-1400 calories and 75+ grams of protein daily and states she is following her eating plan approximately 25% of the time. [name] states she is walking for 30 minutes 2 times per week.  Today's visit was #: 48 Starting weight: 281 lbs Starting date: 02/09/2018 Today's weight: 230 lbs Today's date: 06/29/2021 Total lbs lost to date: 51 Total lbs lost since last in-office visit: 0  Interim History: [name] has struggles more with meal planning and prepping, and she has gotten a bit off track. She is working on Psychologist, counselling for Thanksgiving to decrease holiday weight gain.  Subjective:   1. Pre-diabetes [name] is working on diet and exercise, and she is tolerating metformin with no nausea or vomiting. She is due to have labs.  2. Essential hypertension [name]'s blood pressure is stable on her medications. She denies chest pain or dizziness.  3. Osteoarthritis of right knee, unspecified osteoarthritis type [name] is doing well on Mobic for her knees, with no side effects noted.  4. Mixed hyperlipidemia Janaysia is working on diet, exercise, and weight loss. She is due to have labs.  5. Vitamin D deficiency [name] is stable on Vit D, and she is due for labs.  6. Elevated TSH [name] has a history of some mildly elevated TSH, and she is due to have her labs rechecked.  7. Other depression with emotional eating [name] is stable on her medications, and her mood is appropriate. She continues to work on decreasing emotional eating behaviors.  Assessment/Plan:   1. Pre-diabetes [name] will continue to work on weight loss, exercise, and decreasing simple carbohydrates to help decrease the risk of diabetes. We will check labs today, and we will refill  metformin for 1.5 months.  - metFORMIN (GLUCOPHAGE) 500 MG tablet; TAKE 1 TABLET(500 MG) BY MOUTH TWICE DAILY WITH A MEAL  Dispense: 90 tablet; Refill: 0 - CBC with Differential/Platelet - CMP14+EGFR - Insulin, random - Hemoglobin A1c  2. Essential hypertension [name] will continue working on healthy weight loss and exercise to improve blood pressure control. We check labs today, and we will refill losartan for 1 month.   - losartan (COZAAR) 100 MG tablet; TAKE 1 TABLET(100 MG) BY MOUTH DAILY  Dispense: 30 tablet; Refill: 0  3. Osteoarthritis of right knee, unspecified osteoarthritis type [name] will continue Mobic, and we will refill for 1 month.  - meloxicam (MOBIC) 15 MG tablet; TAKE 1 TABLET(15 MG) BY MOUTH AT BEDTIME  Dispense: 30 tablet; Refill: 0  4. Mixed hyperlipidemia Cardiovascular risk and specific lipid/LDL goals reviewed. We discussed several lifestyle modifications today. We will check labs today. [name] will continue to work on diet, exercise and weight loss efforts. Orders and follow up as documented in patient record.   - Lipid Panel With LDL/HDL Ratio  5. Vitamin D deficiency Low Vitamin D level contributes to fatigue and are associated with obesity, breast, and colon cancer. We will check labs today, and [name] will follow-up for routine testing of Vitamin D, at least 2-3 times per year to avoid over-replacement.  - VITAMIN D 25 Hydroxy (Vit-D Deficiency, Fractures)  6. Elevated TSH We will check labs today, and will follow up at [name]'s next visit. Orders and follow up as documented in patient record.  - TSH -  T4, free - T3  7. Other depression with emotional eating Behavior modification techniques were discussed today to help [name] deal with her emotional/non-hunger eating behaviors. We will refill Wellbutrin XL and Effexor for 1 month. Orders and follow up as documented in patient record.   - buPROPion (WELLBUTRIN XL) 150 MG 24 hr tablet; Take 1 tablet  (150 mg total) by mouth daily.  Dispense: 30 tablet; Refill: 0 - venlafaxine (EFFEXOR) 75 MG tablet; Take 1 tablet (75 mg total) by mouth daily.  Dispense: 30 tablet; Refill: 0  8. Obesity BMI today is 74 [name] is currently in the action stage of change. As such, her goal is to continue with weight loss efforts. She has agreed to keeping a food journal and adhering to recommended goals of 1200-1400 calories and 75+ grams of protein daily.   Exercise goals: As is.  Behavioral modification strategies: meal planning and cooking strategies and holiday eating strategies .  [name] has agreed to follow-up with our clinic in 4 weeks. She was informed of the importance of frequent follow-up visits to maximize her success with intensive lifestyle modifications for her multiple health conditions.   [name] was informed we would discuss her lab results at her next visit unless there is a critical issue that needs to be addressed sooner. [name] agreed to keep her next visit at the agreed upon time to discuss these results.  Objective:   Blood pressure 133/84, pulse 88, temperature 98 F (36.7 C), height _0  (1.676 m), weight 230 lb (104.3 kg), SpO2 97 %. Body mass index is 37.12 kg/m.  General: Cooperative, alert, well developed, in no acute distress. HEENT: Conjunctivae and lids unremarkable. Cardiovascular: Regular rhythm.  Lungs: Normal work of breathing. Neurologic: No focal deficits.   Lab Results  Component Value Date   CREATININE 0.88 10/14/2020   BUN 29 (H) 10/14/2020   NA 142 10/14/2020   K 5.0 10/14/2020   CL 104 10/14/2020   CO2 21 10/14/2020   Lab Results  Component Value Date   ALT 14 10/14/2020   AST 15 10/14/2020   ALKPHOS 100 10/14/2020   BILITOT 0.3 10/14/2020   Lab Results  Component Value Date   HGBA1C 5.2 10/14/2020   HGBA1C 5.2 06/10/2020   HGBA1C 5.4 12/15/2019   HGBA1C 5.5 07/15/2019   HGBA1C 5.3 03/18/2019   Lab Results  Component Value Date   INSULIN  5.5 10/14/2020   INSULIN 5.4 06/10/2020   INSULIN 5.0 12/15/2019   INSULIN 8.0 07/15/2019   INSULIN 13.0 03/18/2019   Lab Results  Component Value Date   TSH 5.070 (H) 10/14/2020   Lab Results  Component Value Date   CHOL 212 (H) 10/14/2020   HDL 83 10/14/2020   LDLCALC 119 (H) 10/14/2020   TRIG 57 10/14/2020   CHOLHDL 3.1 09/27/2017   Lab Results  Component Value Date   VD25OH 52.4 10/14/2020   VD25OH 54.1 06/10/2020   VD25OH 59.7 12/15/2019   Lab Results  Component Value Date   WBC 5.9 03/18/2019   HGB 13.6 03/18/2019   HCT 41.6 03/18/2019   MCV 91 03/18/2019   PLT 263 06/08/2016   No results found for: IRON, TIBC, FERRITIN  Attestation Statements:   Reviewed by clinician on day of visit: allergies, medications, problem list, medical history, surgical history, family history, social history, and previous encounter notes.  Time spent on visit including pre-visit chart review and post-visit care and charting was 42 minutes.    Wilhemena Durie,  am acting as transcriptionist for Dennard Nip, MD.  I have reviewed the above documentation for accuracy and completeness, and I agree with the above. -  Dennard Nip, MD

## 2021-06-30 LAB — CBC WITH DIFFERENTIAL/PLATELET
Basophils Absolute: 0.1 10*3/uL (ref 0.0–0.2)
Basos: 2 %
EOS (ABSOLUTE): 0.1 10*3/uL (ref 0.0–0.4)
Eos: 2 %
Hematocrit: 41.2 % (ref 34.0–46.6)
Hemoglobin: 13.5 g/dL (ref 11.1–15.9)
Immature Grans (Abs): 0 10*3/uL (ref 0.0–0.1)
Immature Granulocytes: 0 %
Lymphocytes Absolute: 1.1 10*3/uL (ref 0.7–3.1)
Lymphs: 23 %
MCH: 29.5 pg (ref 26.6–33.0)
MCHC: 32.8 g/dL (ref 31.5–35.7)
MCV: 90 fL (ref 79–97)
Monocytes Absolute: 0.4 10*3/uL (ref 0.1–0.9)
Monocytes: 9 %
Neutrophils Absolute: 3 10*3/uL (ref 1.4–7.0)
Neutrophils: 64 %
Platelets: 280 10*3/uL (ref 150–450)
RBC: 4.57 x10E6/uL (ref 3.77–5.28)
RDW: 12.5 % (ref 11.7–15.4)
WBC: 4.7 10*3/uL (ref 3.4–10.8)

## 2021-06-30 LAB — T3: T3, Total: 96 ng/dL (ref 71–180)

## 2021-06-30 LAB — HEMOGLOBIN A1C
Est. average glucose Bld gHb Est-mCnc: 108 mg/dL
Hgb A1c MFr Bld: 5.4 % (ref 4.8–5.6)

## 2021-06-30 LAB — CMP14+EGFR
ALT: 14 IU/L (ref 0–32)
AST: 15 IU/L (ref 0–40)
Albumin/Globulin Ratio: 2 (ref 1.2–2.2)
Albumin: 4.4 g/dL (ref 3.8–4.8)
Alkaline Phosphatase: 96 IU/L (ref 44–121)
BUN/Creatinine Ratio: 29 — ABNORMAL HIGH (ref 12–28)
BUN: 25 mg/dL (ref 8–27)
Bilirubin Total: 0.4 mg/dL (ref 0.0–1.2)
CO2: 24 mmol/L (ref 20–29)
Calcium: 9.4 mg/dL (ref 8.7–10.3)
Chloride: 106 mmol/L (ref 96–106)
Creatinine, Ser: 0.85 mg/dL (ref 0.57–1.00)
Globulin, Total: 2.2 g/dL (ref 1.5–4.5)
Glucose: 89 mg/dL (ref 70–99)
Potassium: 4.8 mmol/L (ref 3.5–5.2)
Sodium: 144 mmol/L (ref 134–144)
Total Protein: 6.6 g/dL (ref 6.0–8.5)
eGFR: 76 mL/min/{1.73_m2} (ref >59)

## 2021-06-30 LAB — LIPID PANEL WITH LDL/HDL RATIO
Cholesterol, Total: 209 mg/dL — ABNORMAL HIGH (ref 100–199)
HDL: 83 mg/dL (ref >39)
LDL Chol Calc (NIH): 118 mg/dL — ABNORMAL HIGH (ref 0–99)
LDL/HDL Ratio: 1.4 ratio (ref 0.0–3.2)
Triglycerides: 45 mg/dL (ref 0–149)
VLDL Cholesterol Cal: 8 mg/dL (ref 5–40)

## 2021-06-30 LAB — TSH: TSH: 3.64 u[IU]/mL (ref 0.450–4.500)

## 2021-06-30 LAB — VITAMIN D 25 HYDROXY (VIT D DEFICIENCY, FRACTURES): Vit D, 25-Hydroxy: 48 ng/mL (ref 30.0–100.0)

## 2021-06-30 LAB — INSULIN, RANDOM: INSULIN: 7 u[IU]/mL (ref 2.6–24.9)

## 2021-06-30 LAB — T4, FREE: Free T4: 1.1 ng/dL (ref 0.82–1.77)

## 2021-07-27 ENCOUNTER — Telehealth (INDEPENDENT_AMBULATORY_CARE_PROVIDER_SITE_OTHER): Payer: Federal, State, Local not specified - PPO | Admitting: Family Medicine

## 2021-07-27 ENCOUNTER — Encounter (INDEPENDENT_AMBULATORY_CARE_PROVIDER_SITE_OTHER): Payer: Self-pay | Admitting: Family Medicine

## 2021-07-27 VITALS — Ht 66.0 in | Wt 230.0 lb

## 2021-07-27 DIAGNOSIS — I10 Essential (primary) hypertension: Secondary | ICD-10-CM | POA: Diagnosis not present

## 2021-07-27 DIAGNOSIS — M1711 Unilateral primary osteoarthritis, right knee: Secondary | ICD-10-CM | POA: Diagnosis not present

## 2021-07-27 DIAGNOSIS — F3289 Other specified depressive episodes: Secondary | ICD-10-CM

## 2021-07-27 DIAGNOSIS — Z6841 Body Mass Index (BMI) 40.0 and over, adult: Secondary | ICD-10-CM

## 2021-07-27 MED ORDER — MELOXICAM 15 MG PO TABS: ORAL_TABLET | ORAL | 0 refills | Status: DC

## 2021-07-27 MED ORDER — LOSARTAN POTASSIUM 100 MG PO TABS: ORAL_TABLET | ORAL | 0 refills | Status: DC

## 2021-07-27 MED ORDER — BUPROPION HCL ER (XL) 150 MG PO TB24: 150.0000 mg | ORAL_TABLET | Freq: Every day | ORAL | 0 refills | Status: DC

## 2021-07-27 MED ORDER — VENLAFAXINE HCL 75 MG PO TABS: 75.0000 mg | ORAL_TABLET | Freq: Every day | ORAL | 0 refills | Status: DC

## 2021-07-27 NOTE — Progress Notes (Signed)
TeleHealth Visit:  Due to the COVID-19 pandemic, this visit was completed with telemedicine (audio/video) technology to reduce patient and provider exposure as well as to preserve personal protective equipment.   Cheryl [name] has verbally consented to this TeleHealth visit. The patient is located at home, the provider is located at the Cheryl [name] office. The participants in this visit include the listed provider and patient. The visit was conducted today via MyChart video.   Chief Complaint: OBESITY Cheryl [name] is here to discuss her progress with her obesity treatment plan along with follow-up of her obesity related diagnoses. Cheryl [name] is on keeping a food journal and adhering to recommended goals of 1200-1400 calories and 75+ grams of protein daily and states she is following her eating plan approximately 25% of the time. Cheryl [name] states she is doing 0 minutes 0 times per week.  Today's visit was #: 83 Starting weight: 281 lbs Starting date: 02/09/2018  Interim History: Cheryl [name] was changed to a virtual visit due to an upper respiratory infection. She feels she has likely maintained her weight loss, especially since her appetite is decreased. She is still trying to not skip meals. She is trying to sleep more as well.  Subjective:   1. Essential hypertension Cheryl [name]'s blood pressure has been stable on losartan, with no side effects noted.  2. Other depression with emotional eating Cheryl [name] is stable on her medications, but she notes feeling foggy headed. She has been on Mucinex OTC, and the combination of this and her other medications could be causing this.  3. Osteoarthritis of right knee, unspecified osteoarthritis type Cheryl [name] is stable on Mobic to help with her pain. She continues to work on weight loss to take pressure off of her knee.  Assessment/Plan:   1. Essential hypertension We will refill losartan for 1 month. Cheryl [name] will continue working on healthy weight loss and  exercise to improve blood pressure control. She will watch for signs of hypotension as she continues her lifestyle modifications.  - losartan (COZAAR) 100 MG tablet; TAKE 1 TABLET(100 MG) BY MOUTH DAILY  Dispense: 30 tablet; Refill: 0  2. Other depression with emotional eating We will refill Wellbutrin XL and Effexor for 1 month. Cheryl [name] was advised to not take Wellbutrin with decongestants. Orders and follow up as documented in patient record.   - venlafaxine (EFFEXOR) 75 MG tablet; Take 1 tablet (75 mg total) by mouth daily.  Dispense: 30 tablet; Refill: 0 - buPROPion (WELLBUTRIN XL) 150 MG 24 hr tablet; Take 1 tablet (150 mg total) by mouth daily.  Dispense: 30 tablet; Refill: 0  3. Osteoarthritis of right knee, unspecified osteoarthritis type We will refill Mobic for 1 month. Cheryl [name] will continue with diet, exercise, and weight loss.  - meloxicam (MOBIC) 15 MG tablet; TAKE 1 TABLET(15 MG) BY MOUTH AT BEDTIME  Dispense: 30 tablet; Refill: 0  4. Obesity with current BMI of 37.12 Cheryl [name] is currently in the action stage of change. As such, her goal is to maintain weight over Christmas. She has agreed to keeping a food journal and adhering to recommended goals of 1200-1400 calories and 75+ grams of protein daily.   Exercise goals: Cheryl [name] is to wait until she is feeling better.  Behavioral modification strategies: increasing lean protein intake and holiday eating strategies .  Cheryl [name] has agreed to follow-up with our clinic in 4 weeks. She was informed of the importance of frequent follow-up visits to maximize her success with intensive lifestyle modifications for her multiple health conditions.  Objective:   VITALS: Per patient if applicable, see vitals. GENERAL: Alert and in no acute distress. CARDIOPULMONARY: No increased WOB. Speaking in clear sentences.  PSYCH: Pleasant and cooperative. Speech normal rate and rhythm. Affect is appropriate. Insight and judgement are appropriate.  Attention is focused, linear, and appropriate.  NEURO: Oriented as arrived to appointment on time with no prompting.   Lab Results  Component Value Date   CREATININE 0.85 06/29/2021   BUN 25 06/29/2021   NA 144 06/29/2021   K 4.8 06/29/2021   CL 106 06/29/2021   CO2 24 06/29/2021   Lab Results  Component Value Date   ALT 14 06/29/2021   AST 15 06/29/2021   ALKPHOS 96 06/29/2021   BILITOT 0.4 06/29/2021   Lab Results  Component Value Date   HGBA1C 5.4 06/29/2021   HGBA1C 5.2 10/14/2020   HGBA1C 5.2 06/10/2020   HGBA1C 5.4 12/15/2019   HGBA1C 5.5 07/15/2019   Lab Results  Component Value Date   INSULIN 7.0 06/29/2021   INSULIN 5.5 10/14/2020   INSULIN 5.4 06/10/2020   INSULIN 5.0 12/15/2019   INSULIN 8.0 07/15/2019   Lab Results  Component Value Date   TSH 3.640 06/29/2021   Lab Results  Component Value Date   CHOL 209 (H) 06/29/2021   HDL 83 06/29/2021   LDLCALC 118 (H) 06/29/2021   TRIG 45 06/29/2021   CHOLHDL 3.1 09/27/2017   Lab Results  Component Value Date   VD25OH 48.0 06/29/2021   VD25OH 52.4 10/14/2020   VD25OH 54.1 06/10/2020   Lab Results  Component Value Date   WBC 4.7 06/29/2021   HGB 13.5 06/29/2021   HCT 41.2 06/29/2021   MCV 90 06/29/2021   PLT 280 06/29/2021   No results found for: IRON, TIBC, FERRITIN  Attestation Statements:   Reviewed by clinician on day of visit: allergies, medications, problem list, medical history, surgical history, family history, social history, and previous encounter notes.   I, Trixie Dredge, am acting as transcriptionist for Dennard Nip, MD.  I have reviewed the above documentation for accuracy and completeness, and I agree with the above. - Dennard Nip, MD

## 2021-08-16 ENCOUNTER — Other Ambulatory Visit (INDEPENDENT_AMBULATORY_CARE_PROVIDER_SITE_OTHER): Payer: Self-pay | Admitting: Family Medicine

## 2021-08-16 DIAGNOSIS — R7303 Prediabetes: Secondary | ICD-10-CM

## 2021-08-16 NOTE — Telephone Encounter (Signed)
Dr.Beasley 

## 2021-08-16 NOTE — Telephone Encounter (Signed)
LAST APPOINTMENT DATE: 06/29/21 NEXT APPOINTMENT DATE: 08/30/21   [id number] [address], [place] [address] [place] [address] Phone: [phone] Fax: [phone]  Patient is requesting a refill of the following medications: Requested Prescriptions   Pending Prescriptions Disp Refills   metFORMIN (GLUCOPHAGE) 500 MG tablet [Pharmacy Med Name: METFORMIN 500MG  TABLETS] 180 tablet     Sig: TAKE 1 TABLET(500 MG) BY MOUTH TWICE DAILY WITH A MEAL    Date last filled: 06/29/21 Previously prescribed by Dr. Leafy Ro  Lab Results  Component Value Date   HGBA1C 5.4 06/29/2021   HGBA1C 5.2 10/14/2020   HGBA1C 5.2 06/10/2020   Lab Results  Component Value Date   MICROALBUR 0.4 09/23/2014   LDLCALC 118 (H) 06/29/2021   CREATININE 0.85 06/29/2021   Lab Results  Component Value Date   VD25OH 48.0 06/29/2021   VD25OH 52.4 10/14/2020   VD25OH 54.1 06/10/2020    BP Readings from Last 3 Encounters:  06/29/21 133/84  05/31/21 118/87  05/03/21 136/79

## 2021-08-25 DIAGNOSIS — J019 Acute sinusitis, unspecified: Secondary | ICD-10-CM | POA: Diagnosis not present

## 2021-08-25 DIAGNOSIS — H1033 Unspecified acute conjunctivitis, bilateral: Secondary | ICD-10-CM | POA: Diagnosis not present

## 2021-08-25 DIAGNOSIS — H109 Unspecified conjunctivitis: Secondary | ICD-10-CM | POA: Diagnosis not present

## 2021-08-30 ENCOUNTER — Encounter (INDEPENDENT_AMBULATORY_CARE_PROVIDER_SITE_OTHER): Payer: Self-pay | Admitting: Family Medicine

## 2021-08-30 ENCOUNTER — Other Ambulatory Visit: Payer: Self-pay

## 2021-08-30 ENCOUNTER — Ambulatory Visit (INDEPENDENT_AMBULATORY_CARE_PROVIDER_SITE_OTHER): Payer: Federal, State, Local not specified - PPO | Admitting: Family Medicine

## 2021-08-30 VITALS — BP 142/85 | HR 74 | Temp 97.5°F | Ht 66.0 in | Wt 228.0 lb

## 2021-08-30 DIAGNOSIS — M1711 Unilateral primary osteoarthritis, right knee: Secondary | ICD-10-CM | POA: Diagnosis not present

## 2021-08-30 DIAGNOSIS — E669 Obesity, unspecified: Secondary | ICD-10-CM

## 2021-08-30 DIAGNOSIS — Z9189 Other specified personal risk factors, not elsewhere classified: Secondary | ICD-10-CM

## 2021-08-30 DIAGNOSIS — F3289 Other specified depressive episodes: Secondary | ICD-10-CM | POA: Diagnosis not present

## 2021-08-30 DIAGNOSIS — I10 Essential (primary) hypertension: Secondary | ICD-10-CM

## 2021-08-30 DIAGNOSIS — Z6836 Body mass index (BMI) 36.0-36.9, adult: Secondary | ICD-10-CM

## 2021-08-30 MED ORDER — BUPROPION HCL ER (XL) 150 MG PO TB24: 150.0000 mg | ORAL_TABLET | Freq: Every day | ORAL | 0 refills | Status: DC

## 2021-08-30 MED ORDER — LOSARTAN POTASSIUM 100 MG PO TABS: ORAL_TABLET | ORAL | 0 refills | Status: DC

## 2021-08-30 MED ORDER — MELOXICAM 15 MG PO TABS: ORAL_TABLET | ORAL | 0 refills | Status: DC

## 2021-08-30 MED ORDER — VENLAFAXINE HCL 75 MG PO TABS: 75.0000 mg | ORAL_TABLET | Freq: Every day | ORAL | 0 refills | Status: DC

## 2021-08-30 NOTE — Progress Notes (Signed)
Chief Complaint:   OBESITY Cheryl [name] is here to discuss her progress with her obesity treatment plan along with follow-up of her obesity related diagnoses. Cheryl [name] is on keeping a food journal and adhering to recommended goals of 1200-1400 calories and 75+ grams of protein daily and states she is following her eating plan approximately 5% of the time. Cheryl [name] states she is doing 0 minutes 0 times per week.  Today's visit was #: 72 Starting weight: 281 lbs Starting date: 02/09/2018 Today's weight: 228 lbs Today's date: 08/30/2021 Total lbs lost to date: 53 Total lbs lost since last in-office visit: 2  Interim History: Cheryl [name] continues to do well with weight loss, even over Thanksgiving, Christmas, and New Years. She is working on getting back to meal planning and prepping.  Subjective:   1. Essential hypertension Cheryl [name]'s blood pressure is mildly elevated today, but is normally well controlled.   2. Osteoarthritis of right knee, unspecified osteoarthritis type Cheryl [name] is stable on Mobic as needed. She continues to do well with weight loss.  3. Other depression with emotional eating Cheryl [name] is stable on her medications. Her blood pressure is mildly elevated today, but this is unlikely due to Wellbutrin.  4. At risk for heart disease Cheryl [name] is at a higher than average risk for cardiovascular disease due to obesity.   Assessment/Plan:   1. Essential hypertension Cheryl [name] will continue diet and exercise to improve blood pressure control. We refill losartan for 90 days with no refills. We will recheck her blood pressure in 1 month.   - losartan (COZAAR) 100 MG tablet; TAKE 1 TABLET(100 MG) BY MOUTH DAILY  Dispense: 90 tablet; Refill: 0  2. Osteoarthritis of right knee, unspecified osteoarthritis type We will refill Mobic for 90 days with no refills. We will follow up at Cheryl [name]'s next visit.  - meloxicam (MOBIC) 15 MG tablet; TAKE 1 TABLET(15 MG) BY MOUTH AT BEDTIME  Dispense: 90 tablet;  Refill: 0  3. Other depression with emotional eating Behavior modification techniques were discussed today to help Cheryl [name] deal with her emotional/non-hunger eating behaviors. We will refill Wellbutrin XL and Effexor for 90 days with no refills, and we will recheck her blood pressure in 1 month. Orders and follow up as documented in patient record.   - buPROPion (WELLBUTRIN XL) 150 MG 24 hr tablet; Take 1 tablet (150 mg total) by mouth daily.  Dispense: 90 tablet; Refill: 0 - venlafaxine (EFFEXOR) 75 MG tablet; Take 1 tablet (75 mg total) by mouth daily.  Dispense: 90 tablet; Refill: 0  4. At risk for heart disease Cheryl [name] was given approximately 15 minutes of coronary artery disease prevention counseling today. She is 66 y.o. female and has risk factors for heart disease including obesity. We discussed intensive lifestyle modifications today with an emphasis on specific weight loss instructions and strategies.   Repetitive spaced learning was employed today to elicit superior memory formation and behavioral change.  5. Obesity with current BMI 36.9 Cheryl [name] is currently in the action stage of change. As such, her goal is to continue with weight loss efforts. She has agreed to the Category 2 Plan, or keeping a food journal and adhering to recommended goals of 1200-1400 calories and 70+ grams of protein daily, or following a lower carbohydrate, vegetable and lean protein rich diet plan.   Behavioral modification strategies: increasing lean protein intake and dealing with family or coworker sabotage.  Cheryl [name] has agreed to follow-up with our clinic in 4 weeks. She was informed of  the importance of frequent follow-up visits to maximize her success with intensive lifestyle modifications for her multiple health conditions.   Objective:   Blood pressure (!) 142/85, pulse 74, temperature (!) 97.5 F (36.4 C), temperature source Oral, height 5\' 6"  (1.676 m), weight 228 lb (103.4 kg), SpO2 99 %. Body mass  index is 36.8 kg/m.  General: Cooperative, alert, well developed, in no acute distress. HEENT: Conjunctivae and lids unremarkable. Cardiovascular: Regular rhythm.  Lungs: Normal work of breathing. Neurologic: No focal deficits.   Lab Results  Component Value Date   CREATININE 0.85 06/29/2021   BUN 25 06/29/2021   NA 144 06/29/2021   K 4.8 06/29/2021   CL 106 06/29/2021   CO2 24 06/29/2021   Lab Results  Component Value Date   ALT 14 06/29/2021   AST 15 06/29/2021   ALKPHOS 96 06/29/2021   BILITOT 0.4 06/29/2021   Lab Results  Component Value Date   HGBA1C 5.4 06/29/2021   HGBA1C 5.2 10/14/2020   HGBA1C 5.2 06/10/2020   HGBA1C 5.4 12/15/2019   HGBA1C 5.5 07/15/2019   Lab Results  Component Value Date   INSULIN 7.0 06/29/2021   INSULIN 5.5 10/14/2020   INSULIN 5.4 06/10/2020   INSULIN 5.0 12/15/2019   INSULIN 8.0 07/15/2019   Lab Results  Component Value Date   TSH 3.640 06/29/2021   Lab Results  Component Value Date   CHOL 209 (H) 06/29/2021   HDL 83 06/29/2021   LDLCALC 118 (H) 06/29/2021   TRIG 45 06/29/2021   CHOLHDL 3.1 09/27/2017   Lab Results  Component Value Date   VD25OH 48.0 06/29/2021   VD25OH 52.4 10/14/2020   VD25OH 54.1 06/10/2020   Lab Results  Component Value Date   WBC 4.7 06/29/2021   HGB 13.5 06/29/2021   HCT 41.2 06/29/2021   MCV 90 06/29/2021   PLT 280 06/29/2021   No results found for: IRON, TIBC, FERRITIN  Attestation Statements:   Reviewed by clinician on day of visit: allergies, medications, problem list, medical history, surgical history, family history, social history, and previous encounter notes.   I, Trixie Dredge, am acting as transcriptionist for Dennard Nip, MD.  I have reviewed the above documentation for accuracy and completeness, and I agree with the above. -  Dennard Nip, MD

## 2021-09-01 ENCOUNTER — Other Ambulatory Visit (INDEPENDENT_AMBULATORY_CARE_PROVIDER_SITE_OTHER): Payer: Self-pay | Admitting: Family Medicine

## 2021-09-01 DIAGNOSIS — F3289 Other specified depressive episodes: Secondary | ICD-10-CM

## 2021-09-01 DIAGNOSIS — I10 Essential (primary) hypertension: Secondary | ICD-10-CM

## 2021-09-01 DIAGNOSIS — M1711 Unilateral primary osteoarthritis, right knee: Secondary | ICD-10-CM

## 2021-09-01 NOTE — Telephone Encounter (Signed)
Pt last seen by Dr. Beasley.  

## 2021-09-27 ENCOUNTER — Other Ambulatory Visit: Payer: Self-pay

## 2021-09-27 ENCOUNTER — Ambulatory Visit (INDEPENDENT_AMBULATORY_CARE_PROVIDER_SITE_OTHER): Payer: Federal, State, Local not specified - PPO | Admitting: Family Medicine

## 2021-09-27 ENCOUNTER — Encounter (INDEPENDENT_AMBULATORY_CARE_PROVIDER_SITE_OTHER): Payer: Self-pay | Admitting: Family Medicine

## 2021-09-27 ENCOUNTER — Other Ambulatory Visit (INDEPENDENT_AMBULATORY_CARE_PROVIDER_SITE_OTHER): Payer: Self-pay | Admitting: Family Medicine

## 2021-09-27 VITALS — BP 139/83 | HR 76 | Temp 97.7°F | Ht 66.0 in | Wt 234.0 lb

## 2021-09-27 DIAGNOSIS — I10 Essential (primary) hypertension: Secondary | ICD-10-CM | POA: Diagnosis not present

## 2021-09-27 DIAGNOSIS — E669 Obesity, unspecified: Secondary | ICD-10-CM | POA: Diagnosis not present

## 2021-09-27 DIAGNOSIS — F3289 Other specified depressive episodes: Secondary | ICD-10-CM

## 2021-09-27 DIAGNOSIS — M1711 Unilateral primary osteoarthritis, right knee: Secondary | ICD-10-CM | POA: Diagnosis not present

## 2021-09-27 DIAGNOSIS — Z6837 Body mass index (BMI) 37.0-37.9, adult: Secondary | ICD-10-CM

## 2021-09-27 MED ORDER — LOSARTAN POTASSIUM 100 MG PO TABS: ORAL_TABLET | ORAL | 0 refills | Status: DC

## 2021-09-27 MED ORDER — BUPROPION HCL ER (XL) 150 MG PO TB24: 150.0000 mg | ORAL_TABLET | Freq: Every day | ORAL | 0 refills | Status: DC

## 2021-09-27 MED ORDER — VENLAFAXINE HCL 75 MG PO TABS: 75.0000 mg | ORAL_TABLET | Freq: Every day | ORAL | 0 refills | Status: DC

## 2021-09-27 MED ORDER — MELOXICAM 15 MG PO TABS: ORAL_TABLET | ORAL | 0 refills | Status: DC

## 2021-09-27 NOTE — Progress Notes (Signed)
Chief Complaint:   OBESITY Cheryl [name] is here to discuss her progress with her obesity treatment plan along with follow-up of her obesity related diagnoses. Cheryl [name] is on the Category 2 Plan and keeping a food journal and adhering to recommended goals of 1200-1400 calories and 70+ grams of protein daily and states she is following her eating plan approximately 40% of the time. Cheryl [name] states she is walking for 30 minutes 2-3 times per week.  Today's visit was #: 19 Starting weight: 281 lbs Starting date: 02/09/2018 Today's weight: 234 lbs Today's date: 09/27/2021 Total lbs lost to date: 47 Total lbs lost since last in-office visit: 0  Interim History: Cheryl [name] reports struggling with following Low carbohydrate plan since her last visit due to cost, and finding items in the store. She also struggles with meal prepping, and she hasn't been able to walk as much as she would like due to the weather.   Subjective:   1. Essential hypertension Cheryl [name]'s blood pressure is mildly elevated today, but looks better from her last visit. She taking Cozaar 100 mg. She denies side effects.   2. Osteoarthritis of right knee, unspecified osteoarthritis type Cheryl [name] is stable on Mobic, and she denies side effects.   3. Other depression with emotional eating Cheryl [name] is stable on her current medications, and she denies emotional eating.  Assessment/Plan:   1. Essential hypertension We will refill Cozaar 100 mg PO daily for 1 month. Cheryl [name] will continue dietary changes, exercise, and weight loss. We will continue to monitor and will recheck  her blood pressure in 1 month.  - losartan (COZAAR) 100 MG tablet; TAKE 1 TABLET(100 MG) BY MOUTH DAILY  Dispense: 30 tablet; Refill: 0  2. Osteoarthritis of right knee, unspecified osteoarthritis type Cheryl [name] will continue Mobic, and we will refill for 1 month.   - meloxicam (MOBIC) 15 MG tablet; TAKE 1 TABLET(15 MG) BY MOUTH AT BEDTIME  Dispense: 30 tablet; Refill:  0  3. Other depression with emotional eating Behavior modification techniques were discussed today to help Cheryl [name] deal with her emotional/non-hunger eating behaviors. We will refill Wellbutrin XL and Effexor for 1 month. Side effects were discussed. We will continue to monitor her blood pressure while on Wellbutrin. Orders and follow up as documented in patient record.   - buPROPion (WELLBUTRIN XL) 150 MG 24 hr tablet; Take 1 tablet (150 mg total) by mouth daily.  Dispense: 30 tablet; Refill: 0 - venlafaxine (EFFEXOR) 75 MG tablet; Take 1 tablet (75 mg total) by mouth daily.  Dispense: 30 tablet; Refill: 0  4. Obesity with current BMI 37.8 Cheryl [name] is currently in the action stage of change. As such, her goal is to continue with weight loss efforts. She has agreed to keeping a food journal and adhering to recommended goals of 1200-1400 calories and 75+ grams of protein daily.   Exercise goals: As is. We discussed exercise such as walking at her church or other school during bad weather.   Behavioral modification strategies: increasing lean protein intake, increasing vegetables, increasing water intake, no skipping meals, and meal planning and cooking strategies.  Cheryl [name] has agreed to follow-up with our clinic in 4 weeks. She was informed of the importance of frequent follow-up visits to maximize her success with intensive lifestyle modifications for her multiple health conditions.   Objective:   Blood pressure 139/83, pulse 76, temperature 97.7 F (36.5 C), height 5\' 6"  (1.676 m), weight 234 lb (106.1 kg), SpO2 90 %. Body mass index is 37.77  kg/m.  General: Cooperative, alert, well developed, in no acute distress. HEENT: Conjunctivae and lids unremarkable. Cardiovascular: Regular rhythm.  Lungs: Normal work of breathing. Neurologic: No focal deficits.   Lab Results  Component Value Date   CREATININE 0.85 06/29/2021   BUN 25 06/29/2021   NA 144 06/29/2021   K 4.8 06/29/2021   CL 106  06/29/2021   CO2 24 06/29/2021   Lab Results  Component Value Date   ALT 14 06/29/2021   AST 15 06/29/2021   ALKPHOS 96 06/29/2021   BILITOT 0.4 06/29/2021   Lab Results  Component Value Date   HGBA1C 5.4 06/29/2021   HGBA1C 5.2 10/14/2020   HGBA1C 5.2 06/10/2020   HGBA1C 5.4 12/15/2019   HGBA1C 5.5 07/15/2019   Lab Results  Component Value Date   INSULIN 7.0 06/29/2021   INSULIN 5.5 10/14/2020   INSULIN 5.4 06/10/2020   INSULIN 5.0 12/15/2019   INSULIN 8.0 07/15/2019   Lab Results  Component Value Date   TSH 3.640 06/29/2021   Lab Results  Component Value Date   CHOL 209 (H) 06/29/2021   HDL 83 06/29/2021   LDLCALC 118 (H) 06/29/2021   TRIG 45 06/29/2021   CHOLHDL 3.1 09/27/2017   Lab Results  Component Value Date   VD25OH 48.0 06/29/2021   VD25OH 52.4 10/14/2020   VD25OH 54.1 06/10/2020   Lab Results  Component Value Date   WBC 4.7 06/29/2021   HGB 13.5 06/29/2021   HCT 41.2 06/29/2021   MCV 90 06/29/2021   PLT 280 06/29/2021   No results found for: IRON, TIBC, FERRITIN  Attestation Statements:   Reviewed by clinician on day of visit: allergies, medications, problem list, medical history, surgical history, family history, social history, and previous encounter notes.   I, Trixie Dredge, am acting as transcriptionist for Dennard Nip, MD.  I have reviewed the above documentation for accuracy and completeness, and I agree with the above. -  Dennard Nip, MD

## 2021-09-27 NOTE — Telephone Encounter (Signed)
Dr.Beasley 

## 2021-10-01 ENCOUNTER — Other Ambulatory Visit (INDEPENDENT_AMBULATORY_CARE_PROVIDER_SITE_OTHER): Payer: Self-pay | Admitting: Family Medicine

## 2021-10-01 DIAGNOSIS — F3289 Other specified depressive episodes: Secondary | ICD-10-CM

## 2021-10-01 DIAGNOSIS — M1711 Unilateral primary osteoarthritis, right knee: Secondary | ICD-10-CM

## 2021-10-01 DIAGNOSIS — I10 Essential (primary) hypertension: Secondary | ICD-10-CM

## 2021-10-10 DIAGNOSIS — Z1231 Encounter for screening mammogram for malignant neoplasm of breast: Secondary | ICD-10-CM | POA: Diagnosis not present

## 2021-10-24 ENCOUNTER — Encounter (HOSPITAL_BASED_OUTPATIENT_CLINIC_OR_DEPARTMENT_OTHER): Payer: Self-pay | Admitting: Obstetrics & Gynecology

## 2021-10-25 DIAGNOSIS — H35033 Hypertensive retinopathy, bilateral: Secondary | ICD-10-CM | POA: Diagnosis not present

## 2021-10-25 DIAGNOSIS — E119 Type 2 diabetes mellitus without complications: Secondary | ICD-10-CM | POA: Diagnosis not present

## 2021-10-25 DIAGNOSIS — H25043 Posterior subcapsular polar age-related cataract, bilateral: Secondary | ICD-10-CM | POA: Diagnosis not present

## 2021-10-27 ENCOUNTER — Encounter (INDEPENDENT_AMBULATORY_CARE_PROVIDER_SITE_OTHER): Payer: Self-pay | Admitting: Family Medicine

## 2021-10-27 ENCOUNTER — Ambulatory Visit (INDEPENDENT_AMBULATORY_CARE_PROVIDER_SITE_OTHER): Payer: Federal, State, Local not specified - PPO | Admitting: Family Medicine

## 2021-10-27 ENCOUNTER — Other Ambulatory Visit (INDEPENDENT_AMBULATORY_CARE_PROVIDER_SITE_OTHER): Payer: Self-pay | Admitting: Family Medicine

## 2021-10-27 ENCOUNTER — Other Ambulatory Visit: Payer: Self-pay

## 2021-10-27 VITALS — BP 149/86 | HR 61 | Ht 66.0 in | Wt 230.0 lb

## 2021-10-27 DIAGNOSIS — I10 Essential (primary) hypertension: Secondary | ICD-10-CM

## 2021-10-27 DIAGNOSIS — R7303 Prediabetes: Secondary | ICD-10-CM | POA: Diagnosis not present

## 2021-10-27 DIAGNOSIS — E669 Obesity, unspecified: Secondary | ICD-10-CM

## 2021-10-27 DIAGNOSIS — Z6837 Body mass index (BMI) 37.0-37.9, adult: Secondary | ICD-10-CM

## 2021-10-27 DIAGNOSIS — F3289 Other specified depressive episodes: Secondary | ICD-10-CM | POA: Diagnosis not present

## 2021-10-27 MED ORDER — LOSARTAN POTASSIUM 100 MG PO TABS: ORAL_TABLET | ORAL | 0 refills | Status: DC

## 2021-10-27 MED ORDER — BUPROPION HCL ER (XL) 150 MG PO TB24: 150.0000 mg | ORAL_TABLET | Freq: Every day | ORAL | 0 refills | Status: DC

## 2021-10-27 MED ORDER — METFORMIN HCL 500 MG PO TABS: ORAL_TABLET | ORAL | 0 refills | Status: DC

## 2021-11-01 NOTE — Progress Notes (Signed)
? ? ? ?Chief Complaint:  ? ?OBESITY ?Barba is here to discuss her progress with her obesity treatment plan along with follow-up of her obesity related diagnoses. Yuko is on keeping a food journal and adhering to recommended goals of 1200-1400 calories and 75+ grams of protein daily and states she is following her eating plan approximately 50% of the time. Lataisha states she is walking for 15 minutes 3 times per week. ? ?Today's visit was #: 61 ?Starting weight: 281 lbs ?Starting date: 02/09/2018 ?Today's weight: 230 lbs ?Today's date: 10/27/2021 ?Total lbs lost to date: 68 ?Total lbs lost since last in-office visit: 4 ? ?Interim History: Arcenia continues to do very well with weight loss, even with extra celebrations and social temptations. She plans to increase her activity and exercise as the weather warms up. ? ?Subjective:  ? ?1. Essential hypertension ?Arlita's blood pressure is elevated today. She is doing well with her diet and exercise. She denies chest pain or headache. ? ?2. Pre-diabetes ?Caryn is doing well with diet, exercise, and weight loss. She is stable on metformin, and she denies nausea, vomiting, or hypoglycemia. ? ?3. Other depression with emotional eating ?Joreen is doing well with minimizing emotional eating behaviors. No side effects of Wellbutrin, but her blood pressure is elevated which does not seem related to her medications. ? ?Assessment/Plan:  ? ?1. Essential hypertension ?We will refill losartan for 1 month. Tineka continue with her diet and exercise to improve blood pressure control. She is to check her blood pressure at home. ? ?- losartan (COZAAR) 100 MG tablet; TAKE 1 TABLET(100 MG) BY MOUTH DAILY  Dispense: 30 tablet; Refill: 0 ? ?2. Pre-diabetes ?We will refill metformin for 1.5 months. Sharley will continue to work on weight loss, exercise, and decreasing simple carbohydrates to help decrease the risk of diabetes.  ? ?- metFORMIN (GLUCOPHAGE) 500 MG tablet; TAKE 1 TABLET(500 MG)  BY MOUTH TWICE DAILY WITH A MEAL  Dispense: 90 tablet; Refill: 0 ? ?3. Other depression with emotional eating ?We will refill Wellbutrin XL for 1 month, and will continue to monitor her blood pressure. Behavior modification techniques were discussed today to help Neila deal with her emotional/non-hunger eating behaviors. Orders and follow up as documented in patient record.  ? ?- buPROPion (WELLBUTRIN XL) 150 MG 24 hr tablet; Take 1 tablet (150 mg total) by mouth daily.  Dispense: 30 tablet; Refill: 0 ? ?4. Obesity with current BMI 37.3 ?Kyeshia is currently in the action stage of change. As such, her goal is to continue with weight loss efforts. She has agreed to keeping a food journal and adhering to recommended goals of 1200-1300 calories and 75+ grams of protein daily.  ? ?Exercise goals: As is. ? ?Behavioral modification strategies: increasing lean protein intake and meal planning and cooking strategies. ? ?Medea has agreed to follow-up with our clinic in 4 weeks. She was informed of the importance of frequent follow-up visits to maximize her success with intensive lifestyle modifications for her multiple health conditions.  ? ?Objective:  ? ?Blood pressure (!) 149/86, pulse 61, height '5\' 6"'$  (1.676 m), weight 230 lb (104.3 kg), SpO2 99 %. ?Body mass index is 37.12 kg/m?. ? ?General: Cooperative, alert, well developed, in no acute distress. ?HEENT: Conjunctivae and lids unremarkable. ?Cardiovascular: Regular rhythm.  ?Lungs: Normal work of breathing. ?Neurologic: No focal deficits.  ? ?Lab Results  ?Component Value Date  ? CREATININE 0.85 06/29/2021  ? BUN 25 06/29/2021  ? NA 144 06/29/2021  ? K  4.8 06/29/2021  ? CL 106 06/29/2021  ? CO2 24 06/29/2021  ? ?Lab Results  ?Component Value Date  ? ALT 14 06/29/2021  ? AST 15 06/29/2021  ? ALKPHOS 96 06/29/2021  ? BILITOT 0.4 06/29/2021  ? ?Lab Results  ?Component Value Date  ? HGBA1C 5.4 06/29/2021  ? HGBA1C 5.2 10/14/2020  ? HGBA1C 5.2 06/10/2020  ? HGBA1C 5.4  12/15/2019  ? HGBA1C 5.5 07/15/2019  ? ?Lab Results  ?Component Value Date  ? INSULIN 7.0 06/29/2021  ? INSULIN 5.5 10/14/2020  ? INSULIN 5.4 06/10/2020  ? INSULIN 5.0 12/15/2019  ? INSULIN 8.0 07/15/2019  ? ?Lab Results  ?Component Value Date  ? TSH 3.640 06/29/2021  ? ?Lab Results  ?Component Value Date  ? CHOL 209 (H) 06/29/2021  ? HDL 83 06/29/2021  ? LDLCALC 118 (H) 06/29/2021  ? TRIG 45 06/29/2021  ? CHOLHDL 3.1 09/27/2017  ? ?Lab Results  ?Component Value Date  ? VD25OH 48.0 06/29/2021  ? VD25OH 52.4 10/14/2020  ? VD25OH 54.1 06/10/2020  ? ?Lab Results  ?Component Value Date  ? WBC 4.7 06/29/2021  ? HGB 13.5 06/29/2021  ? HCT 41.2 06/29/2021  ? MCV 90 06/29/2021  ? PLT 280 06/29/2021  ? ?No results found for: IRON, TIBC, FERRITIN ? ?Attestation Statements:  ? ?Reviewed by clinician on day of visit: allergies, medications, problem list, medical history, surgical history, family history, social history, and previous encounter notes. ? ? ?I, Trixie Dredge, am acting as transcriptionist for Dennard Nip, MD. ? ?I have reviewed the above documentation for accuracy and completeness, and I agree with the above. -  Dennard Nip, MD ? ? ?

## 2021-11-24 ENCOUNTER — Encounter (INDEPENDENT_AMBULATORY_CARE_PROVIDER_SITE_OTHER): Payer: Self-pay | Admitting: Family Medicine

## 2021-11-24 ENCOUNTER — Other Ambulatory Visit (INDEPENDENT_AMBULATORY_CARE_PROVIDER_SITE_OTHER): Payer: Self-pay | Admitting: Family Medicine

## 2021-11-24 ENCOUNTER — Ambulatory Visit (INDEPENDENT_AMBULATORY_CARE_PROVIDER_SITE_OTHER): Payer: Federal, State, Local not specified - PPO | Admitting: Family Medicine

## 2021-11-24 VITALS — BP 150/89 | HR 83 | Temp 97.6°F | Ht 66.0 in | Wt 227.0 lb

## 2021-11-24 DIAGNOSIS — E669 Obesity, unspecified: Secondary | ICD-10-CM

## 2021-11-24 DIAGNOSIS — M1711 Unilateral primary osteoarthritis, right knee: Secondary | ICD-10-CM

## 2021-11-24 DIAGNOSIS — I10 Essential (primary) hypertension: Secondary | ICD-10-CM

## 2021-11-24 DIAGNOSIS — Z6836 Body mass index (BMI) 36.0-36.9, adult: Secondary | ICD-10-CM

## 2021-11-24 DIAGNOSIS — F3289 Other specified depressive episodes: Secondary | ICD-10-CM

## 2021-11-24 DIAGNOSIS — Z9189 Other specified personal risk factors, not elsewhere classified: Secondary | ICD-10-CM

## 2021-11-24 MED ORDER — LOSARTAN POTASSIUM 100 MG PO TABS: ORAL_TABLET | ORAL | 0 refills | Status: DC

## 2021-11-24 MED ORDER — MELOXICAM 15 MG PO TABS: ORAL_TABLET | ORAL | 0 refills | Status: DC

## 2021-11-24 MED ORDER — BUPROPION HCL ER (XL) 150 MG PO TB24: 150.0000 mg | ORAL_TABLET | Freq: Every day | ORAL | 0 refills | Status: DC

## 2021-11-26 ENCOUNTER — Other Ambulatory Visit (INDEPENDENT_AMBULATORY_CARE_PROVIDER_SITE_OTHER): Payer: Self-pay | Admitting: Family Medicine

## 2021-11-26 DIAGNOSIS — I10 Essential (primary) hypertension: Secondary | ICD-10-CM

## 2021-11-26 DIAGNOSIS — F3289 Other specified depressive episodes: Secondary | ICD-10-CM

## 2021-12-02 ENCOUNTER — Other Ambulatory Visit (INDEPENDENT_AMBULATORY_CARE_PROVIDER_SITE_OTHER): Payer: Self-pay | Admitting: Family Medicine

## 2021-12-02 DIAGNOSIS — M1711 Unilateral primary osteoarthritis, right knee: Secondary | ICD-10-CM

## 2021-12-06 NOTE — Progress Notes (Signed)
? ? ? ?Chief Complaint:  ? ?OBESITY ?Cheryl [name] is here to discuss her progress with her obesity treatment plan along with follow-up of her obesity related diagnoses. Cheryl [name] is on keeping a food journal and adhering to recommended goals of 1200-1300 calories and 75+ grams of protein daily and states she is following her eating plan approximately 25% of the time. Cheryl [name] states she is walking for 15-20 minutes 2-3 times per week. ? ?Today's visit was #: 60 ?Starting weight: 281 lbs ?Starting date: 02/09/2018 ?Today's weight: 227 lbs ?Today's date: 11/24/2021 ?Total lbs lost to date: 83 ?Total lbs lost since last in-office visit: 3 ? ?Interim History: Cheryl [name] continues to do well with weight loss on her plan. Her hunger is mostly controlled. She will be traveling on vacation and she is working on making traveling eating strategies.  ? ?Subjective:  ? ?1. Essential hypertension ?Cheryl [name]'s blood pressure is elevated today. She forgot her medications this morning, and she has hasn't been using her CPAP as regularly.  ? ?2. Osteoarthritis of right knee, unspecified osteoarthritis type ?Cheryl [name] is stable on meloxicam, and she denies signs of gastritis. She has normal renal function. ? ?3. Other depression with emotional eating ?Cheryl [name] is stable on Wellbutrin, and she needs a refill. She is doing very  well with minimizing emotional eating behaviors.  ? ?4. At risk for heart disease ?Cheryl [name] is at higher than average risk for cardiovascular disease due to obesity. ? ?Assessment/Plan:  ? ?1. Essential hypertension ?We will refill losartan for 1 month. Cheryl [name] is working on healthy weight loss and exercise to improve blood pressure control. We will watch for signs of hypotension as she continues her lifestyle modifications. ? ?- losartan (COZAAR) 100 MG tablet; TAKE 1 TABLET(100 MG) BY MOUTH DAILY  Dispense: 30 tablet; Refill: 0 ? ?2. Osteoarthritis of right knee, unspecified osteoarthritis type ?Cheryl [name] will continue meloxicam, and we  will refill for 1 month. ? ?- meloxicam (MOBIC) 15 MG tablet; TAKE 1 TABLET(15 MG) BY MOUTH AT BEDTIME  Dispense: 30 tablet; Refill: 0 ? ?3. Other depression with emotional eating ?We will refill Wellbutrin XL for 1 month. Behavior modification techniques were discussed today to help Cheryl [name] deal with her emotional/non-hunger eating behaviors.  Orders and follow up as documented in patient record.  ? ?- buPROPion (WELLBUTRIN XL) 150 MG 24 hr tablet; Take 1 tablet (150 mg total) by mouth daily.  Dispense: 30 tablet; Refill: 0 ? ?4. At risk for heart disease ?Cheryl [name] was given approximately 15 minutes of coronary artery disease prevention counseling today. She is 66 y.o. female and has risk factors for heart disease including obesity. We discussed intensive lifestyle modifications today with an emphasis on specific weight loss instructions and strategies. ? ?Repetitive spaced learning was employed today to elicit superior memory formation and behavioral change.  ? ?5. Obesity with current BMI of 36.7 ?Cheryl [name] is currently in the action stage of change. As such, her goal is to continue with weight loss efforts. She has agreed to keeping a food journal and adhering to recommended goals of 1200-1400 calories and 75+ grams of protein daily.  ? ?We will recheck fasting labs at her next visit. ? ?Exercise goals: As is. ? ?Behavioral modification strategies: increasing water intake. ? ?Cheryl [name] has agreed to follow-up with our clinic in 4 weeks. She was informed of the importance of frequent follow-up visits to maximize her success with intensive lifestyle modifications for her multiple health conditions.  ? ?Objective:  ? ?Blood pressure (!) 150/89, pulse  83, temperature 97.6 ?F (36.4 ?C), height '5\' 6"'$  (1.676 m), weight 227 lb (103 kg), SpO2 98 %. ?Body mass index is 36.64 kg/m?. ? ?General: Cooperative, alert, well developed, in no acute distress. ?HEENT: Conjunctivae and lids unremarkable. ?Cardiovascular: Regular rhythm.   ?Lungs: Normal work of breathing. ?Neurologic: No focal deficits.  ? ?Lab Results  ?Component Value Date  ? CREATININE 0.85 06/29/2021  ? BUN 25 06/29/2021  ? NA 144 06/29/2021  ? K 4.8 06/29/2021  ? CL 106 06/29/2021  ? CO2 24 06/29/2021  ? ?Lab Results  ?Component Value Date  ? ALT 14 06/29/2021  ? AST 15 06/29/2021  ? ALKPHOS 96 06/29/2021  ? BILITOT 0.4 06/29/2021  ? ?Lab Results  ?Component Value Date  ? HGBA1C 5.4 06/29/2021  ? HGBA1C 5.2 10/14/2020  ? HGBA1C 5.2 06/10/2020  ? HGBA1C 5.4 12/15/2019  ? HGBA1C 5.5 07/15/2019  ? ?Lab Results  ?Component Value Date  ? INSULIN 7.0 06/29/2021  ? INSULIN 5.5 10/14/2020  ? INSULIN 5.4 06/10/2020  ? INSULIN 5.0 12/15/2019  ? INSULIN 8.0 07/15/2019  ? ?Lab Results  ?Component Value Date  ? TSH 3.640 06/29/2021  ? ?Lab Results  ?Component Value Date  ? CHOL 209 (H) 06/29/2021  ? HDL 83 06/29/2021  ? LDLCALC 118 (H) 06/29/2021  ? TRIG 45 06/29/2021  ? CHOLHDL 3.1 09/27/2017  ? ?Lab Results  ?Component Value Date  ? VD25OH 48.0 06/29/2021  ? VD25OH 52.4 10/14/2020  ? VD25OH 54.1 06/10/2020  ? ?Lab Results  ?Component Value Date  ? WBC 4.7 06/29/2021  ? HGB 13.5 06/29/2021  ? HCT 41.2 06/29/2021  ? MCV 90 06/29/2021  ? PLT 280 06/29/2021  ? ?No results found for: IRON, TIBC, FERRITIN ? ?Attestation Statements:  ? ?Reviewed by clinician on day of visit: allergies, medications, problem list, medical history, surgical history, family history, social history, and previous encounter notes. ? ? ?I, Trixie Dredge, am acting as transcriptionist for Dennard Nip, MD. ? ?I have reviewed the above documentation for accuracy and completeness, and I agree with the above. -  Dennard Nip, MD ? ? ?

## 2021-12-07 ENCOUNTER — Other Ambulatory Visit (INDEPENDENT_AMBULATORY_CARE_PROVIDER_SITE_OTHER): Payer: Self-pay | Admitting: Family Medicine

## 2021-12-07 DIAGNOSIS — F3289 Other specified depressive episodes: Secondary | ICD-10-CM

## 2021-12-09 ENCOUNTER — Other Ambulatory Visit (INDEPENDENT_AMBULATORY_CARE_PROVIDER_SITE_OTHER): Payer: Self-pay | Admitting: Family Medicine

## 2021-12-09 DIAGNOSIS — R7303 Prediabetes: Secondary | ICD-10-CM

## 2021-12-27 ENCOUNTER — Ambulatory Visit (INDEPENDENT_AMBULATORY_CARE_PROVIDER_SITE_OTHER): Payer: Federal, State, Local not specified - PPO | Admitting: Family Medicine

## 2021-12-27 ENCOUNTER — Other Ambulatory Visit (INDEPENDENT_AMBULATORY_CARE_PROVIDER_SITE_OTHER): Payer: Self-pay | Admitting: Family Medicine

## 2021-12-27 ENCOUNTER — Encounter (INDEPENDENT_AMBULATORY_CARE_PROVIDER_SITE_OTHER): Payer: Self-pay | Admitting: Family Medicine

## 2021-12-27 VITALS — BP 139/88 | HR 78 | Temp 97.9°F | Ht 66.0 in | Wt 231.0 lb

## 2021-12-27 DIAGNOSIS — M1711 Unilateral primary osteoarthritis, right knee: Secondary | ICD-10-CM | POA: Insufficient documentation

## 2021-12-27 DIAGNOSIS — R7303 Prediabetes: Secondary | ICD-10-CM

## 2021-12-27 DIAGNOSIS — I10 Essential (primary) hypertension: Secondary | ICD-10-CM

## 2021-12-27 DIAGNOSIS — Z6837 Body mass index (BMI) 37.0-37.9, adult: Secondary | ICD-10-CM

## 2021-12-27 DIAGNOSIS — F3289 Other specified depressive episodes: Secondary | ICD-10-CM

## 2021-12-27 DIAGNOSIS — E669 Obesity, unspecified: Secondary | ICD-10-CM

## 2021-12-27 MED ORDER — LOSARTAN POTASSIUM 100 MG PO TABS: ORAL_TABLET | ORAL | 0 refills | Status: DC

## 2021-12-27 MED ORDER — METFORMIN HCL 500 MG PO TABS: ORAL_TABLET | ORAL | 0 refills | Status: DC

## 2021-12-27 MED ORDER — MELOXICAM 15 MG PO TABS: ORAL_TABLET | ORAL | 0 refills | Status: DC

## 2021-12-27 MED ORDER — VENLAFAXINE HCL 75 MG PO TABS: 75.0000 mg | ORAL_TABLET | Freq: Every day | ORAL | 0 refills | Status: DC

## 2021-12-27 MED ORDER — BUPROPION HCL ER (XL) 150 MG PO TB24: 150.0000 mg | ORAL_TABLET | Freq: Every day | ORAL | 0 refills | Status: DC

## 2022-01-10 NOTE — Progress Notes (Unsigned)
Chief Complaint:   OBESITY Cheryl [name] is here to discuss her progress with her obesity treatment plan along with follow-up of her obesity related diagnoses. Cheryl [name] is on keeping a food journal and adhering to recommended goals of 1200-1400 calories and 75+ grams of protein daily and states she is following her eating plan approximately 35% of the time. Cheryl [name] states she is walking for 30 minutes 2 times per week.  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 She is trying to journal on and off, but her protein is low which has likely decreased her RMR.   Subjective:   1. Pre-diabetes Cheryl [name] on metformin, and she is working on her diet and exercise.   2. Essential hypertension Cheryl [name]'s blood pressure is controlled at the high end of normal. No side effects were noted.   3. Osteoarthritis of right knee, unspecified osteoarthritis type Cheryl [name] is considering having knee replacement surgery this year.   4. Other depression with emotional eating Cheryl [name]'s mood is stable, but her husband is dealing with chronic health issues and her mood has decreased.   Assessment/Plan:   1. Pre-diabetes We will refill metformin. Cheryl [name] will continue to work on weight loss, exercise, and decreasing simple carbohydrates to help decrease the risk of diabetes.   - metFORMIN (GLUCOPHAGE) 500 MG tablet; TAKE 1 TABLET(500 MG) BY MOUTH TWICE DAILY WITH A MEAL  Dispense: 90 tablet; Refill: 0  2. Essential hypertension We will refill losartan for 1 month. Cheryl [name] will continue her diet and exercise to improve blood pressure control. We will watch for signs of hypotension as she continues her lifestyle modifications.  - losartan (COZAAR) 100 MG tablet; TAKE 1 TABLET(100 MG) BY MOUTH DAILY   Dispense: 30 tablet; Refill: 0  3. Osteoarthritis of right knee, unspecified osteoarthritis type Cheryl [name] will continue Mobic 15 mg daily with food, and we will refill for 1 month.  - meloxicam (MOBIC) 15 MG tablet; TAKE 1 TABLET(15 MG) BY MOUTH AT BEDTIME  Dispense: 30 tablet; Refill: 0  4. Other depression with emotional eating We will refill Wellbutrin XL and Effexor for 1 month. Behavior modification techniques were discussed today to help Cheryl [name] deal with her emotional/non-hunger eating behaviors.  Orders and follow up as documented in patient record.   - buPROPion (WELLBUTRIN XL) 150 MG 24 hr tablet; Take 1 tablet (150 mg total) by mouth daily.  Dispense: 30 tablet; Refill: 0 - venlafaxine (EFFEXOR) 75 MG tablet; Take 1 tablet (75 mg total) by mouth daily.  Dispense: 30 tablet; Refill: 0  5. Obesity, Current BMI 37.4 Cheryl [name] is currently in the action stage of change. As such, her goal is to continue with weight loss efforts. She has agreed to keeping a food journal and adhering to recommended goals of 1200-1400 calories and 75+ grams of protein daily.   Exercise goals: As is.  Behavioral modification strategies: keeping a strict food journal.  Cheryl [name] has agreed to follow-up with our clinic in 4 weeks. She was informed of the importance of frequent follow-up visits to maximize her success with intensive lifestyle modifications for her multiple health conditions.   Objective:   Blood pressure 139/88, pulse 78, temperature 97.9 F (36.6 C), height '5\' 6"'$  (1.676 m), weight 231 lb (104.8 kg), SpO2 99 %. Body mass index is  37.28 kg/m.  General: Cooperative, alert, well developed, in no acute distress. HEENT: Conjunctivae and lids unremarkable. Cardiovascular: Regular rhythm.  Lungs: Normal work of breathing. Neurologic: No focal deficits.   Lab Results  Component Value Date   CREATININE 0.85 06/29/2021   BUN 25 06/29/2021   NA 144 06/29/2021   K 4.8 06/29/2021   CL 106  06/29/2021   CO2 24 06/29/2021   Lab Results  Component Value Date   ALT 14 06/29/2021   AST 15 06/29/2021   ALKPHOS 96 06/29/2021   BILITOT 0.4 06/29/2021   Lab Results  Component Value Date   HGBA1C 5.4 06/29/2021   HGBA1C 5.2 10/14/2020   HGBA1C 5.2 06/10/2020   HGBA1C 5.4 12/15/2019   HGBA1C 5.5 07/15/2019   Lab Results  Component Value Date   INSULIN 7.0 06/29/2021   INSULIN 5.5 10/14/2020   INSULIN 5.4 06/10/2020   INSULIN 5.0 12/15/2019   INSULIN 8.0 07/15/2019   Lab Results  Component Value Date   TSH 3.640 06/29/2021   Lab Results  Component Value Date   CHOL 209 (H) 06/29/2021   HDL 83 06/29/2021   LDLCALC 118 (H) 06/29/2021   TRIG 45 06/29/2021   CHOLHDL 3.1 09/27/2017   Lab Results  Component Value Date   VD25OH 48.0 06/29/2021   VD25OH 52.4 10/14/2020   VD25OH 54.1 06/10/2020   Lab Results  Component Value Date   WBC 4.7 06/29/2021   HGB 13.5 06/29/2021   HCT 41.2 06/29/2021   MCV 90 06/29/2021   PLT 280 06/29/2021   No results found for: IRON, TIBC, FERRITIN  Attestation Statements:   Reviewed by clinician on day of visit: allergies, medications, problem list, medical history, surgical history, family history, social history, and previous encounter notes.   I, Trixie Dredge, am acting as transcriptionist for Dennard Nip, MD.  I have reviewed the above documentation for accuracy and completeness, and I agree with the above. -  Dennard Nip, MD

## 2022-01-24 ENCOUNTER — Ambulatory Visit (INDEPENDENT_AMBULATORY_CARE_PROVIDER_SITE_OTHER): Payer: Federal, State, Local not specified - PPO | Admitting: Family Medicine

## 2022-01-24 ENCOUNTER — Other Ambulatory Visit (INDEPENDENT_AMBULATORY_CARE_PROVIDER_SITE_OTHER): Payer: Self-pay | Admitting: Family Medicine

## 2022-01-24 ENCOUNTER — Encounter (INDEPENDENT_AMBULATORY_CARE_PROVIDER_SITE_OTHER): Payer: Self-pay | Admitting: Family Medicine

## 2022-01-24 VITALS — BP 129/87 | HR 77 | Temp 98.1°F | Ht 66.0 in | Wt 229.0 lb

## 2022-01-24 DIAGNOSIS — M1711 Unilateral primary osteoarthritis, right knee: Secondary | ICD-10-CM | POA: Diagnosis not present

## 2022-01-24 DIAGNOSIS — F3289 Other specified depressive episodes: Secondary | ICD-10-CM

## 2022-01-24 DIAGNOSIS — Z6837 Body mass index (BMI) 37.0-37.9, adult: Secondary | ICD-10-CM

## 2022-01-24 DIAGNOSIS — E669 Obesity, unspecified: Secondary | ICD-10-CM

## 2022-01-24 DIAGNOSIS — I10 Essential (primary) hypertension: Secondary | ICD-10-CM | POA: Diagnosis not present

## 2022-01-24 MED ORDER — BUPROPION HCL ER (XL) 150 MG PO TB24: 150.0000 mg | ORAL_TABLET | Freq: Every day | ORAL | 0 refills | Status: DC

## 2022-01-24 MED ORDER — LOSARTAN POTASSIUM 100 MG PO TABS: ORAL_TABLET | ORAL | 0 refills | Status: DC

## 2022-01-24 MED ORDER — MELOXICAM 15 MG PO TABS: ORAL_TABLET | ORAL | 0 refills | Status: DC

## 2022-01-24 MED ORDER — VENLAFAXINE HCL 75 MG PO TABS: 75.0000 mg | ORAL_TABLET | Freq: Every day | ORAL | 0 refills | Status: DC

## 2022-01-25 NOTE — Progress Notes (Signed)
Chief Complaint:   OBESITY Careena is here to discuss her progress with her obesity treatment plan along with follow-up of her obesity related diagnoses. Rosemond is on keeping a food journal and adhering to recommended goals of 1200-1400 calories and 75+ grams of protein daily and states she is following her eating plan approximately 20% of the time. Alvine states she is doing 0 minutes 0 times per week.  Today's visit was #: 80 Starting weight: 281 lbs Starting date: 12/10/2017 Today's weight: 229 lbs Today's date: 01/24/2022 Total lbs lost to date: 52 Total lbs lost since last in-office visit: 2  Interim History: Randal has done well with weight loss, but she has had extra stress financially due to a very sophisticated financial scam.  She expects the next month to be calmer and meal planning will be easier.  Subjective:   1. Essential hypertension Deashia's blood pressure is well controlled today.  She is using her CPAP more regularly.  2. Osteoarthritis of right knee, unspecified osteoarthritis type Nicolina is stable on Mobic.  She is still having pain with increased activity or longstanding.  3. Other depression with emotional eating Olivene has had some stressors.  She is on Effexor and Wellbutrin.  Assessment/Plan:   1. Essential hypertension Lajoy will continue losartan 100 mg daily, and we will refill for 1 month.  - losartan (COZAAR) 100 MG tablet; TAKE 1 TABLET(100 MG) BY MOUTH DAILY  Dispense: 30 tablet; Refill: 0  2. Osteoarthritis of right knee, unspecified osteoarthritis type Christean will continue Mobic 15 mg nightly, and we will refill for 1 month.  - meloxicam (MOBIC) 15 MG tablet; TAKE 1 TABLET(15 MG) BY MOUTH AT BEDTIME  Dispense: 30 tablet; Refill: 0  3. Other depression with emotional eating We will refill Wellbutrin XL and Effexor for 1 month.  Behavior modification techniques were discussed today to help Jerzi deal with her emotional/non-hunger eating  behaviors.  Orders and follow up as documented in patient record.   - buPROPion (WELLBUTRIN XL) 150 MG 24 hr tablet; Take 1 tablet (150 mg total) by mouth daily.  Dispense: 30 tablet; Refill: 0 - venlafaxine (EFFEXOR) 75 MG tablet; Take 1 tablet (75 mg total) by mouth daily.  Dispense: 30 tablet; Refill: 0  4. Obesity, Current BMI 37.0 Yehudis is currently in the action stage of change. As such, her goal is to continue with weight loss efforts. She has agreed to keeping a food journal and adhering to recommended goals of 1200-1400 calories and 75+ grams of protein daily.   Behavioral modification strategies: increasing lean protein intake.  Nishka has agreed to follow-up with our clinic in 4 weeks. She was informed of the importance of frequent follow-up visits to maximize her success with intensive lifestyle modifications for her multiple health conditions.   Objective:   Blood pressure 129/87, pulse 77, temperature 98.1 F (36.7 C), height '5\' 6"'$  (1.676 m), weight 229 lb (103.9 kg), SpO2 98 %. Body mass index is 36.96 kg/m.  General: Cooperative, alert, well developed, in no acute distress. HEENT: Conjunctivae and lids unremarkable. Cardiovascular: Regular rhythm.  Lungs: Normal work of breathing. Neurologic: No focal deficits.   Lab Results  Component Value Date   CREATININE 0.85 06/29/2021   BUN 25 06/29/2021   NA 144 06/29/2021   K 4.8 06/29/2021   CL 106 06/29/2021   CO2 24 06/29/2021   Lab Results  Component Value Date   ALT 14 06/29/2021   AST 15 06/29/2021  ALKPHOS 96 06/29/2021   BILITOT 0.4 06/29/2021   Lab Results  Component Value Date   HGBA1C 5.4 06/29/2021   HGBA1C 5.2 10/14/2020   HGBA1C 5.2 06/10/2020   HGBA1C 5.4 12/15/2019   HGBA1C 5.5 07/15/2019   Lab Results  Component Value Date   INSULIN 7.0 06/29/2021   INSULIN 5.5 10/14/2020   INSULIN 5.4 06/10/2020   INSULIN 5.0 12/15/2019   INSULIN 8.0 07/15/2019   Lab Results  Component Value Date    TSH 3.640 06/29/2021   Lab Results  Component Value Date   CHOL 209 (H) 06/29/2021   HDL 83 06/29/2021   LDLCALC 118 (H) 06/29/2021   TRIG 45 06/29/2021   CHOLHDL 3.1 09/27/2017   Lab Results  Component Value Date   VD25OH 48.0 06/29/2021   VD25OH 52.4 10/14/2020   VD25OH 54.1 06/10/2020   Lab Results  Component Value Date   WBC 4.7 06/29/2021   HGB 13.5 06/29/2021   HCT 41.2 06/29/2021   MCV 90 06/29/2021   PLT 280 06/29/2021   No results found for: "IRON", "TIBC", "FERRITIN"  Attestation Statements:   Reviewed by clinician on day of visit: allergies, medications, problem list, medical history, surgical history, family history, social history, and previous encounter notes.  Time spent on visit including pre-visit chart review and post-visit care and charting was 40 minutes.   I, Trixie Dredge, am acting as transcriptionist for Dennard Nip, MD.  I have reviewed the above documentation for accuracy and completeness, and I agree with the above. -  Dennard Nip, MD

## 2022-02-08 ENCOUNTER — Other Ambulatory Visit (INDEPENDENT_AMBULATORY_CARE_PROVIDER_SITE_OTHER): Payer: Self-pay | Admitting: Family Medicine

## 2022-02-08 DIAGNOSIS — R7303 Prediabetes: Secondary | ICD-10-CM

## 2022-02-21 ENCOUNTER — Ambulatory Visit (INDEPENDENT_AMBULATORY_CARE_PROVIDER_SITE_OTHER): Payer: Federal, State, Local not specified - PPO | Admitting: Family Medicine

## 2022-02-21 ENCOUNTER — Encounter (INDEPENDENT_AMBULATORY_CARE_PROVIDER_SITE_OTHER): Payer: Self-pay | Admitting: Family Medicine

## 2022-02-21 VITALS — BP 125/83 | HR 75 | Temp 97.6°F | Ht 66.0 in | Wt 228.0 lb

## 2022-02-21 DIAGNOSIS — F3289 Other specified depressive episodes: Secondary | ICD-10-CM

## 2022-02-21 DIAGNOSIS — E669 Obesity, unspecified: Secondary | ICD-10-CM | POA: Diagnosis not present

## 2022-02-21 DIAGNOSIS — Z6836 Body mass index (BMI) 36.0-36.9, adult: Secondary | ICD-10-CM | POA: Diagnosis not present

## 2022-02-21 MED ORDER — VENLAFAXINE HCL 75 MG PO TABS: 75.0000 mg | ORAL_TABLET | Freq: Every day | ORAL | 0 refills | Status: DC

## 2022-02-21 NOTE — Progress Notes (Signed)
Chief Complaint:   OBESITY Cheryl [name] is here to discuss her progress with her obesity treatment plan along with follow-up of her obesity related diagnoses. Cheryl [name] is on keeping a food journal and adhering to recommended goals of 1200-1400 calories and 75+ grams of protein daily and states she is following her eating plan approximately 25% of the time. Cheryl [name] states she is doing 0 minutes 0 times per week.  Today's visit was #: 36 Starting weight: 281 lbs Starting date: 12/10/2017 Today's weight: 228 lbs Today's date: 02/21/2022 Total lbs lost to date: 53 Total lbs lost since last in-office visit: 1  Interim History: Cheryl [name] continues to do well with weight loss. Her hunger is mostly controlled. She is journaling on and off, but she is still mindful of her choices.  Subjective:   1. Other depression with emotional eating Cheryl [name]'s mood is stable on her medications.  She notes increased border eating when she is at home versus at work.  Assessment/Plan:   1. Other depression with emotional eating Cheryl [name] will continue Effexor 75 mg once daily, and we will refill for 1 month.  Emotional eating behavior strategies were discussed.  - venlafaxine (EFFEXOR) 75 MG tablet; Take 1 tablet (75 mg total) by mouth daily.  Dispense: 30 tablet; Refill: 0  2. Obesity, Current BMI 36.9 Cheryl [name] is currently in the action stage of change. As such, her goal is to continue with weight loss efforts. She has agreed to keeping a food journal and adhering to recommended goals of 1200-1400 calories and 75+ grams of protein daily.   Behavioral modification strategies: increasing lean protein intake and meal planning and cooking strategies.  Cheryl [name] has agreed to follow-up with our clinic in 4 weeks. She was informed of the importance of frequent follow-up visits to maximize her success with intensive lifestyle modifications for her multiple health conditions.   Objective:   Blood pressure 125/83, pulse 75,  temperature 97.6 F (36.4 C), height '5\' 6"'$  (1.676 m), weight 228 lb (103.4 kg), SpO2 96 %. Body mass index is 36.8 kg/m.  General: Cooperative, alert, well developed, in no acute distress. HEENT: Conjunctivae and lids unremarkable. Cardiovascular: Regular rhythm.  Lungs: Normal work of breathing. Neurologic: No focal deficits.   Lab Results  Component Value Date   CREATININE 0.85 06/29/2021   BUN 25 06/29/2021   NA 144 06/29/2021   K 4.8 06/29/2021   CL 106 06/29/2021   CO2 24 06/29/2021   Lab Results  Component Value Date   ALT 14 06/29/2021   AST 15 06/29/2021   ALKPHOS 96 06/29/2021   BILITOT 0.4 06/29/2021   Lab Results  Component Value Date   HGBA1C 5.4 06/29/2021   HGBA1C 5.2 10/14/2020   HGBA1C 5.2 06/10/2020   HGBA1C 5.4 12/15/2019   HGBA1C 5.5 07/15/2019   Lab Results  Component Value Date   INSULIN 7.0 06/29/2021   INSULIN 5.5 10/14/2020   INSULIN 5.4 06/10/2020   INSULIN 5.0 12/15/2019   INSULIN 8.0 07/15/2019   Lab Results  Component Value Date   TSH 3.640 06/29/2021   Lab Results  Component Value Date   CHOL 209 (H) 06/29/2021   HDL 83 06/29/2021   LDLCALC 118 (H) 06/29/2021   TRIG 45 06/29/2021   CHOLHDL 3.1 09/27/2017   Lab Results  Component Value Date   VD25OH 48.0 06/29/2021   VD25OH 52.4 10/14/2020   VD25OH 54.1 06/10/2020   Lab Results  Component Value Date   WBC 4.7 06/29/2021  HGB 13.5 06/29/2021   HCT 41.2 06/29/2021   MCV 90 06/29/2021   PLT 280 06/29/2021   No results found for: "IRON", "TIBC", "FERRITIN"  Attestation Statements:   Reviewed by clinician on day of visit: allergies, medications, problem list, medical history, surgical history, family history, social history, and previous encounter notes.  Time spent on visit including pre-visit chart review and post-visit care and charting was 30 minutes.   I, Trixie Dredge, am acting as transcriptionist for Dennard Nip, MD.  I have reviewed the above  documentation for accuracy and completeness, and I agree with the above. -  Dennard Nip, MD

## 2022-03-21 ENCOUNTER — Ambulatory Visit (INDEPENDENT_AMBULATORY_CARE_PROVIDER_SITE_OTHER): Payer: Federal, State, Local not specified - PPO | Admitting: Family Medicine

## 2022-03-21 ENCOUNTER — Other Ambulatory Visit (INDEPENDENT_AMBULATORY_CARE_PROVIDER_SITE_OTHER): Payer: Self-pay | Admitting: Family Medicine

## 2022-03-21 ENCOUNTER — Encounter (INDEPENDENT_AMBULATORY_CARE_PROVIDER_SITE_OTHER): Payer: Self-pay | Admitting: Family Medicine

## 2022-03-21 VITALS — BP 129/83 | HR 84 | Temp 97.8°F | Ht 66.0 in | Wt 232.0 lb

## 2022-03-21 DIAGNOSIS — I10 Essential (primary) hypertension: Secondary | ICD-10-CM | POA: Diagnosis not present

## 2022-03-21 DIAGNOSIS — E66813 Obesity, class 3: Secondary | ICD-10-CM

## 2022-03-21 DIAGNOSIS — E669 Obesity, unspecified: Secondary | ICD-10-CM

## 2022-03-21 DIAGNOSIS — E88819 Insulin resistance, unspecified: Secondary | ICD-10-CM

## 2022-03-21 DIAGNOSIS — M1711 Unilateral primary osteoarthritis, right knee: Secondary | ICD-10-CM | POA: Diagnosis not present

## 2022-03-21 DIAGNOSIS — F3289 Other specified depressive episodes: Secondary | ICD-10-CM

## 2022-03-21 DIAGNOSIS — Z6837 Body mass index (BMI) 37.0-37.9, adult: Secondary | ICD-10-CM

## 2022-03-21 DIAGNOSIS — E8881 Metabolic syndrome: Secondary | ICD-10-CM | POA: Diagnosis not present

## 2022-03-21 MED ORDER — MELOXICAM 15 MG PO TABS: ORAL_TABLET | ORAL | 0 refills | Status: DC

## 2022-03-21 MED ORDER — VENLAFAXINE HCL 75 MG PO TABS: 75.0000 mg | ORAL_TABLET | Freq: Every day | ORAL | 0 refills | Status: DC

## 2022-03-21 MED ORDER — LOSARTAN POTASSIUM 100 MG PO TABS: ORAL_TABLET | ORAL | 0 refills | Status: DC

## 2022-03-21 MED ORDER — METFORMIN HCL 500 MG PO TABS: ORAL_TABLET | ORAL | 0 refills | Status: DC

## 2022-03-21 MED ORDER — BUPROPION HCL ER (XL) 150 MG PO TB24: 150.0000 mg | ORAL_TABLET | Freq: Every day | ORAL | 0 refills | Status: DC

## 2022-03-22 ENCOUNTER — Encounter (INDEPENDENT_AMBULATORY_CARE_PROVIDER_SITE_OTHER): Payer: Self-pay

## 2022-03-28 NOTE — Progress Notes (Signed)
Chief Complaint:   OBESITY Cheryl [name] is here to discuss her progress with her obesity treatment plan along with follow-up of her obesity related diagnoses. Cheryl [name] is on keeping a food journal and adhering to recommended goals of 1200-1400 calories and 75+ grams of protein daily and states she is following her eating plan approximately 25% of the time. Cheryl [name] states she is doing 0 minutes 0 times per week.  Today's visit was #: 29 Starting weight: 281 lbs Starting date: 12/10/2017 Today's weight: 232 lbs Today's date: 03/21/2022 Total lbs lost to date: 49 Total lbs lost since last in-office visit: 0  Interim History: Cheryl [name] has been craving bread more often and she has been eating more tomato sandwiches, worse over the last 2-3 weeks.   Subjective:   1. Essential hypertension Cheryl [name]'s blood pressure is controlled on her medications, and she denies chest pain or headache.  2. Osteoarthritis of right knee, unspecified osteoarthritis type Cheryl [name] is stable on Mobic, and she takes this daily.  3. Insulin resistance Cheryl [name] is stable on metformin, and she denies nausea, vomiting, or hypoglycemia.  4. Other depression with emotional eating Cheryl [name] has increased stress with her husband's non-Hodgins lymphoma.  She notes increased carbohydrate cravings, which is likely related to stress.  Assessment/Plan:   1. Essential hypertension Cheryl [name] will continue losartan 100 mg once daily, and we will refill for 1 month.   - losartan (COZAAR) 100 MG tablet; TAKE 1 TABLET(100 MG) BY MOUTH DAILY  Dispense: 30 tablet; Refill: 0  2. Osteoarthritis of right knee, unspecified osteoarthritis type We will refill Mobic for 1 month. Cheryl [name] is to not take other NSAIDS, but she is ok to take Tylenol.   - meloxicam (MOBIC) 15 MG tablet; TAKE 1 TABLET(15 MG) BY MOUTH AT BEDTIME  Dispense: 30 tablet; Refill: 0  3. Insulin resistance We will refill metformin for 90 days. Cheryl [name] will continue to work on weight  loss, exercise, and decreasing simple carbohydrates to help decrease the risk of diabetes. Cheryl [name] agreed to follow-up with [name] as directed to closely monitor her progress.  - metFORMIN (GLUCOPHAGE) 500 MG tablet; TAKE 1 TABLET(500 MG) BY MOUTH TWICE DAILY WITH A MEAL  Dispense: 180 tablet; Refill: 0  4. Other depression with emotional eating Cheryl [name] agreed to increase Wellbutrin XL to 300 mg once daily with no refills, and we will refill Effexor 75 mg once daily for 1 month.  - buPROPion (WELLBUTRIN XL) 150 MG 24 hr tablet; Take 1 tablet (150 mg total) by mouth daily.  Dispense: 30 tablet; Refill: 0 - venlafaxine (EFFEXOR) 75 MG tablet; Take 1 tablet (75 mg total) by mouth daily.  Dispense: 30 tablet; Refill: 0  5. Obesity, Current BMI 37.4 Cheryl [name] is currently in the action stage of change. As such, her goal is to continue with weight loss efforts. She has agreed to keeping a food journal and adhering to recommended goals of 1200-1400 calories and 75+ grams of protein daily.   Behavioral modification strategies: increasing lean protein intake.  Cheryl [name] has agreed to follow-up with our clinic in 3 weeks. She was informed of the importance of frequent follow-up visits to maximize her success with intensive lifestyle modifications for her multiple health conditions.   Objective:   Blood pressure 129/83, pulse 84, temperature 97.8 F (36.6 C), height '5\' 6"'$  (1.676 m), weight 232 lb (105.2 kg), SpO2 97 %. Body mass index is 37.45 kg/m.  General: Cooperative, alert, well developed, in no acute distress. HEENT: Conjunctivae and lids  unremarkable. Cardiovascular: Regular rhythm.  Lungs: Normal work of breathing. Neurologic: No focal deficits.   Lab Results  Component Value Date   CREATININE 0.85 06/29/2021   BUN 25 06/29/2021   NA 144 06/29/2021   K 4.8 06/29/2021   CL 106 06/29/2021   CO2 24 06/29/2021   Lab Results  Component Value Date   ALT 14 06/29/2021   AST 15 06/29/2021    ALKPHOS 96 06/29/2021   BILITOT 0.4 06/29/2021   Lab Results  Component Value Date   HGBA1C 5.4 06/29/2021   HGBA1C 5.2 10/14/2020   HGBA1C 5.2 06/10/2020   HGBA1C 5.4 12/15/2019   HGBA1C 5.5 07/15/2019   Lab Results  Component Value Date   INSULIN 7.0 06/29/2021   INSULIN 5.5 10/14/2020   INSULIN 5.4 06/10/2020   INSULIN 5.0 12/15/2019   INSULIN 8.0 07/15/2019   Lab Results  Component Value Date   TSH 3.640 06/29/2021   Lab Results  Component Value Date   CHOL 209 (H) 06/29/2021   HDL 83 06/29/2021   LDLCALC 118 (H) 06/29/2021   TRIG 45 06/29/2021   CHOLHDL 3.1 09/27/2017   Lab Results  Component Value Date   VD25OH 48.0 06/29/2021   VD25OH 52.4 10/14/2020   VD25OH 54.1 06/10/2020   Lab Results  Component Value Date   WBC 4.7 06/29/2021   HGB 13.5 06/29/2021   HCT 41.2 06/29/2021   MCV 90 06/29/2021   PLT 280 06/29/2021   No results found for: "IRON", "TIBC", "FERRITIN"  Attestation Statements:   Reviewed by clinician on day of visit: allergies, medications, problem list, medical history, surgical history, family history, social history, and previous encounter notes.   I, Trixie Dredge, am acting as transcriptionist for Dennard Nip, MD.  I have reviewed the above documentation for accuracy and completeness, and I agree with the above. -  Dennard Nip, MD

## 2022-04-13 ENCOUNTER — Encounter (INDEPENDENT_AMBULATORY_CARE_PROVIDER_SITE_OTHER): Payer: Self-pay | Admitting: Family Medicine

## 2022-04-13 ENCOUNTER — Ambulatory Visit (INDEPENDENT_AMBULATORY_CARE_PROVIDER_SITE_OTHER): Payer: Federal, State, Local not specified - PPO | Admitting: Family Medicine

## 2022-04-13 ENCOUNTER — Other Ambulatory Visit (INDEPENDENT_AMBULATORY_CARE_PROVIDER_SITE_OTHER): Payer: Self-pay | Admitting: Family Medicine

## 2022-04-13 VITALS — BP 138/84 | HR 78 | Temp 97.7°F | Ht 66.0 in | Wt 232.0 lb

## 2022-04-13 DIAGNOSIS — F3289 Other specified depressive episodes: Secondary | ICD-10-CM

## 2022-04-13 DIAGNOSIS — E7800 Pure hypercholesterolemia, unspecified: Secondary | ICD-10-CM

## 2022-04-13 DIAGNOSIS — E7849 Other hyperlipidemia: Secondary | ICD-10-CM

## 2022-04-13 DIAGNOSIS — R7303 Prediabetes: Secondary | ICD-10-CM | POA: Diagnosis not present

## 2022-04-13 DIAGNOSIS — M1711 Unilateral primary osteoarthritis, right knee: Secondary | ICD-10-CM

## 2022-04-13 DIAGNOSIS — E66813 Obesity, class 3: Secondary | ICD-10-CM | POA: Insufficient documentation

## 2022-04-13 DIAGNOSIS — E669 Obesity, unspecified: Secondary | ICD-10-CM

## 2022-04-13 DIAGNOSIS — I10 Essential (primary) hypertension: Secondary | ICD-10-CM

## 2022-04-13 DIAGNOSIS — E559 Vitamin D deficiency, unspecified: Secondary | ICD-10-CM

## 2022-04-13 DIAGNOSIS — Z6841 Body Mass Index (BMI) 40.0 and over, adult: Secondary | ICD-10-CM | POA: Insufficient documentation

## 2022-04-13 DIAGNOSIS — Z6837 Body mass index (BMI) 37.0-37.9, adult: Secondary | ICD-10-CM

## 2022-04-13 MED ORDER — BUPROPION HCL ER (SR) 200 MG PO TB12: 200.0000 mg | ORAL_TABLET | Freq: Every day | ORAL | 0 refills | Status: DC

## 2022-04-13 MED ORDER — VENLAFAXINE HCL 75 MG PO TABS: 75.0000 mg | ORAL_TABLET | Freq: Every day | ORAL | 0 refills | Status: DC

## 2022-04-13 MED ORDER — LOSARTAN POTASSIUM 100 MG PO TABS: ORAL_TABLET | ORAL | 0 refills | Status: DC

## 2022-04-13 MED ORDER — MELOXICAM 15 MG PO TABS: ORAL_TABLET | ORAL | 0 refills | Status: DC

## 2022-04-14 LAB — CBC WITH DIFFERENTIAL/PLATELET
Basophils Absolute: 0.1 10*3/uL (ref 0.0–0.2)
Basos: 2 %
EOS (ABSOLUTE): 0.1 10*3/uL (ref 0.0–0.4)
Eos: 2 %
Hematocrit: 43.6 % (ref 34.0–46.6)
Hemoglobin: 14.2 g/dL (ref 11.1–15.9)
Immature Grans (Abs): 0 10*3/uL (ref 0.0–0.1)
Immature Granulocytes: 0 %
Lymphocytes Absolute: 1.1 10*3/uL (ref 0.7–3.1)
Lymphs: 24 %
MCH: 28.5 pg (ref 26.6–33.0)
MCHC: 32.6 g/dL (ref 31.5–35.7)
MCV: 88 fL (ref 79–97)
Monocytes Absolute: 0.5 10*3/uL (ref 0.1–0.9)
Monocytes: 10 %
Neutrophils Absolute: 2.9 10*3/uL (ref 1.4–7.0)
Neutrophils: 62 %
Platelets: 237 10*3/uL (ref 150–450)
RBC: 4.98 x10E6/uL (ref 3.77–5.28)
RDW: 13.4 % (ref 11.7–15.4)
WBC: 4.7 10*3/uL (ref 3.4–10.8)

## 2022-04-14 LAB — LIPID PANEL WITH LDL/HDL RATIO
Cholesterol, Total: 221 mg/dL — ABNORMAL HIGH (ref 100–199)
HDL: 82 mg/dL (ref >39)
LDL Chol Calc (NIH): 129 mg/dL — ABNORMAL HIGH (ref 0–99)
LDL/HDL Ratio: 1.6 ratio (ref 0.0–3.2)
Triglycerides: 56 mg/dL (ref 0–149)
VLDL Cholesterol Cal: 10 mg/dL (ref 5–40)

## 2022-04-14 LAB — CMP14+EGFR
ALT: 14 IU/L (ref 0–32)
AST: 17 IU/L (ref 0–40)
Albumin/Globulin Ratio: 1.9 (ref 1.2–2.2)
Albumin: 4.5 g/dL (ref 3.9–4.9)
Alkaline Phosphatase: 91 IU/L (ref 44–121)
BUN/Creatinine Ratio: 31 — ABNORMAL HIGH (ref 12–28)
BUN: 26 mg/dL (ref 8–27)
Bilirubin Total: 0.5 mg/dL (ref 0.0–1.2)
CO2: 21 mmol/L (ref 20–29)
Calcium: 9.4 mg/dL (ref 8.7–10.3)
Chloride: 105 mmol/L (ref 96–106)
Creatinine, Ser: 0.84 mg/dL (ref 0.57–1.00)
Globulin, Total: 2.4 g/dL (ref 1.5–4.5)
Glucose: 83 mg/dL (ref 70–99)
Potassium: 4.5 mmol/L (ref 3.5–5.2)
Sodium: 143 mmol/L (ref 134–144)
Total Protein: 6.9 g/dL (ref 6.0–8.5)
eGFR: 77 mL/min/{1.73_m2} (ref >59)

## 2022-04-14 LAB — HEMOGLOBIN A1C
Est. average glucose Bld gHb Est-mCnc: 114 mg/dL
Hgb A1c MFr Bld: 5.6 % (ref 4.8–5.6)

## 2022-04-14 LAB — VITAMIN D 25 HYDROXY (VIT D DEFICIENCY, FRACTURES): Vit D, 25-Hydroxy: 38.1 ng/mL (ref 30.0–100.0)

## 2022-04-14 LAB — INSULIN, RANDOM: INSULIN: 4.4 u[IU]/mL (ref 2.6–24.9)

## 2022-04-19 ENCOUNTER — Other Ambulatory Visit (INDEPENDENT_AMBULATORY_CARE_PROVIDER_SITE_OTHER): Payer: Self-pay | Admitting: Family Medicine

## 2022-04-19 DIAGNOSIS — F3289 Other specified depressive episodes: Secondary | ICD-10-CM

## 2022-04-19 NOTE — Progress Notes (Signed)
Chief Complaint:   OBESITY Cheryl [name] is here to discuss her progress with her obesity treatment plan along with follow-up of her obesity related diagnoses. Cheryl [name] is on keeping a food journal and adhering to recommended goals of 1200-1400 calories and 75+ grams of protein daily and states she is following her eating plan approximately 20% of the time. Cheryl [name] states she is walking for 20 minutes 3 times per week.  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  Subjective:   1. Essential hypertension Cheryl [name]'s blood pressure is stable on her medications, and with her diet and weight loss.   2. Osteoarthritis of right knee, unspecified osteoarthritis type Cheryl [name] is stable on Mobic, and she requests a refill.   3. Vitamin D deficiency Cheryl [name] is due to have her levels, and she notes fatigue.   4. Other hyperlipidemia Cheryl [name]'s LDL is elevated, and she is working on her diet. She is due for labs.   5. Prediabetes Cheryl [name] is working on her diet and she is due for labs.    6. Other depression with emotional eating Cheryl [name]'s dose was supposed to be increased at her last visit, but this was not done.   Assessment/Plan:   1. Essential hypertension Cheryl [name] will continue losartan 100 mg once daily, and we will refill for 1 month.  2. Osteoarthritis of right knee, unspecified osteoarthritis type Cheryl [name] will continue Mobic 15 mg qhs, and we will refill for 1 month.  - meloxicam (MOBIC) 15 MG tablet; TAKE 1 TABLET(15 MG) BY MOUTH AT BEDTIME  Dispense: 30 tablet; Refill: 0  3. Vitamin D deficiency We will check labs today. Cheryl [name] will follow-up for routine testing of Vitamin D, at least 2-3 times per year to avoid over-replacement.  -  VITAMIN D 25 Hydroxy (Vit-D Deficiency, Fractures)  4. Other hyperlipidemia We will check labs today. Cheryl [name] will continue to work on diet, exercise and weight loss efforts. Orders and follow up as documented in patient record.   - Lipid Panel With LDL/HDL Ratio  5. Prediabetes We will check labs today. Cheryl [name] will continue to work on weight loss, exercise, and decreasing simple carbohydrates to help decrease the risk of diabetes.   - CMP14+EGFR - Hemoglobin A1c - Insulin, random - CBC with Differential/Platelet  6. Other depression with emotional eating Cheryl [name] agreed to change Wellbutrin SR to 200 mg q AM and we will refill for 1 month, and we will refill Effexor for 1 month.  - venlafaxine (EFFEXOR) 75 MG tablet; Take 1 tablet (75 mg total) by mouth daily.  Dispense: 30 tablet; Refill: 0 - buPROPion (WELLBUTRIN SR) 200 MG 12 hr tablet; Take 1 tablet (200 mg total) by mouth daily.  Dispense: 30 tablet; Refill: 0  7. Obesity, Current BMI 37.5 Cheryl [name] is currently in the action stage of change. As such, her goal is to continue with weight loss efforts. She has agreed to keeping a food journal and adhering to recommended goals of 1200-1400 calories and 75+ grams of protein daily.   Exercise goals: As is.   Behavioral modification strategies: increasing lean protein intake and meal planning and cooking strategies.  Cheryl [name] has agreed to follow-up with our clinic in 3 to 4 weeks. She was informed of  the importance of frequent follow-up visits to maximize her success with intensive lifestyle modifications for her multiple health conditions.   Cheryl [name] was informed we would discuss her lab results at her next visit unless there is a critical issue that needs to be addressed sooner. Cheryl [name] agreed to keep her next visit at the agreed upon time to discuss these results.  Objective:   Blood pressure 138/84, pulse 78, temperature 97.7 F (36.5 C), height '5\' 6"'  (1.676 m), weight 232 lb (105.2 kg),  SpO2 98 %. Body mass index is 37.45 kg/m.  General: Cooperative, alert, well developed, in no acute distress. HEENT: Conjunctivae and lids unremarkable. Cardiovascular: Regular rhythm.  Lungs: Normal work of breathing. Neurologic: No focal deficits.   Lab Results  Component Value Date   CREATININE 0.84 04/13/2022   BUN 26 04/13/2022   NA 143 04/13/2022   K 4.5 04/13/2022   CL 105 04/13/2022   CO2 21 04/13/2022   Lab Results  Component Value Date   ALT 14 04/13/2022   AST 17 04/13/2022   ALKPHOS 91 04/13/2022   BILITOT 0.5 04/13/2022   Lab Results  Component Value Date   HGBA1C 5.6 04/13/2022   HGBA1C 5.4 06/29/2021   HGBA1C 5.2 10/14/2020   HGBA1C 5.2 06/10/2020   HGBA1C 5.4 12/15/2019   Lab Results  Component Value Date   INSULIN 4.4 04/13/2022   INSULIN 7.0 06/29/2021   INSULIN 5.5 10/14/2020   INSULIN 5.4 06/10/2020   INSULIN 5.0 12/15/2019   Lab Results  Component Value Date   TSH 3.640 06/29/2021   Lab Results  Component Value Date   CHOL 221 (H) 04/13/2022   HDL 82 04/13/2022   LDLCALC 129 (H) 04/13/2022   TRIG 56 04/13/2022   CHOLHDL 3.1 09/27/2017   Lab Results  Component Value Date   VD25OH 38.1 04/13/2022   VD25OH 48.0 06/29/2021   VD25OH 52.4 10/14/2020   Lab Results  Component Value Date   WBC 4.7 04/13/2022   HGB 14.2 04/13/2022   HCT 43.6 04/13/2022   MCV 88 04/13/2022   PLT 237 04/13/2022   No results found for: "IRON", "TIBC", "FERRITIN"  Attestation Statements:   Reviewed by clinician on day of visit: allergies, medications, problem list, medical history, surgical history, family history, social history, and previous encounter notes.   I, Trixie Dredge, am acting as transcriptionist for Dennard Nip, MD.  I have reviewed the above documentation for accuracy and completeness, and I agree with the above. -  Dennard Nip, MD

## 2022-04-21 DIAGNOSIS — H33031 Retinal detachment with giant retinal tear, right eye: Secondary | ICD-10-CM | POA: Diagnosis not present

## 2022-04-21 DIAGNOSIS — H4311 Vitreous hemorrhage, right eye: Secondary | ICD-10-CM | POA: Diagnosis not present

## 2022-04-21 DIAGNOSIS — H33011 Retinal detachment with single break, right eye: Secondary | ICD-10-CM | POA: Diagnosis not present

## 2022-04-22 DIAGNOSIS — H33011 Retinal detachment with single break, right eye: Secondary | ICD-10-CM | POA: Diagnosis not present

## 2022-04-22 DIAGNOSIS — H59811 Chorioretinal scars after surgery for detachment, right eye: Secondary | ICD-10-CM | POA: Diagnosis not present

## 2022-04-26 DIAGNOSIS — H33011 Retinal detachment with single break, right eye: Secondary | ICD-10-CM | POA: Diagnosis not present

## 2022-04-26 DIAGNOSIS — H31091 Other chorioretinal scars, right eye: Secondary | ICD-10-CM | POA: Diagnosis not present

## 2022-05-04 DIAGNOSIS — H43811 Vitreous degeneration, right eye: Secondary | ICD-10-CM | POA: Diagnosis not present

## 2022-05-04 DIAGNOSIS — H33011 Retinal detachment with single break, right eye: Secondary | ICD-10-CM | POA: Diagnosis not present

## 2022-05-04 DIAGNOSIS — H31091 Other chorioretinal scars, right eye: Secondary | ICD-10-CM | POA: Diagnosis not present

## 2022-05-04 DIAGNOSIS — H43391 Other vitreous opacities, right eye: Secondary | ICD-10-CM | POA: Diagnosis not present

## 2022-05-15 ENCOUNTER — Encounter (INDEPENDENT_AMBULATORY_CARE_PROVIDER_SITE_OTHER): Payer: Self-pay | Admitting: Family Medicine

## 2022-05-15 ENCOUNTER — Ambulatory Visit (INDEPENDENT_AMBULATORY_CARE_PROVIDER_SITE_OTHER): Payer: Federal, State, Local not specified - PPO | Admitting: Family Medicine

## 2022-05-15 VITALS — BP 132/83 | HR 79 | Temp 98.3°F | Ht 66.0 in | Wt 234.0 lb

## 2022-05-15 DIAGNOSIS — M1711 Unilateral primary osteoarthritis, right knee: Secondary | ICD-10-CM | POA: Diagnosis not present

## 2022-05-15 DIAGNOSIS — F3289 Other specified depressive episodes: Secondary | ICD-10-CM

## 2022-05-15 DIAGNOSIS — I10 Essential (primary) hypertension: Secondary | ICD-10-CM | POA: Diagnosis not present

## 2022-05-15 DIAGNOSIS — E669 Obesity, unspecified: Secondary | ICD-10-CM | POA: Diagnosis not present

## 2022-05-15 DIAGNOSIS — Z6837 Body mass index (BMI) 37.0-37.9, adult: Secondary | ICD-10-CM

## 2022-05-15 MED ORDER — VENLAFAXINE HCL 75 MG PO TABS: 75.0000 mg | ORAL_TABLET | Freq: Every day | ORAL | 0 refills | Status: DC

## 2022-05-15 MED ORDER — BUPROPION HCL ER (SR) 200 MG PO TB12: 200.0000 mg | ORAL_TABLET | Freq: Every day | ORAL | 0 refills | Status: DC

## 2022-05-15 MED ORDER — LOSARTAN POTASSIUM 100 MG PO TABS: ORAL_TABLET | ORAL | 0 refills | Status: DC

## 2022-05-15 MED ORDER — MELOXICAM 15 MG PO TABS: ORAL_TABLET | ORAL | 0 refills | Status: DC

## 2022-05-16 NOTE — Progress Notes (Signed)
Chief Complaint:   OBESITY Jennae is here to discuss her progress with her obesity treatment plan along with follow-up of her obesity related diagnoses. Versa is on keeping a food journal and adhering to recommended goals of 1200-1400 calories and 75+ grams of protein daily and states she is following her eating plan approximately 25% of the time. Caria states she is walking for 60 minutes 3 times per week.  Today's visit was #: 49 Starting weight: 281 lbs Starting date: 12/10/2017 Today's weight: 234 lbs Today's date: 05/15/2022 Total lbs lost to date: 47 Total lbs lost since last in-office visit: 0  Interim History: Carolle has had a lot of stressors and she has not been able to meal plan as well.  Her hunger is mostly controlled and her protein is not always at goal, but she is mindful.  She is retaining some fluid today.  Subjective:   1. Essential hypertension Annya's blood pressure is stable on her medications. No side effects were noted.   2. Osteoarthritis of right knee, unspecified osteoarthritis type Lucee is stable on Mobic, and she is taking Mobic with food. She denies GI upset.   3. Other depression with emotional eating Addeline is on medications, and she is working on decreasing her emotional eating behaviors. No side effects were noted.   Assessment/Plan:   1. Essential hypertension Cai will continue losartan 100 mg once daily, and we will refill for 90 days.   - losartan (COZAAR) 100 MG tablet; TAKE 1 TABLET(100 MG) BY MOUTH DAILY  Dispense: 90 tablet; Refill: 0  2. Osteoarthritis of right knee, unspecified osteoarthritis type Sherita will continue Mobic 15 mg qhs, and we will refill for 1 month.  - meloxicam (MOBIC) 15 MG tablet; TAKE 1 TABLET(15 MG) BY MOUTH AT BEDTIME  Dispense: 30 tablet; Refill: 0  3. Other depression with emotional eating Kadence will continue her medications, and we will refill both Effexor and Wellbutrin SR for 1 month.  -  venlafaxine (EFFEXOR) 75 MG tablet; Take 1 tablet (75 mg total) by mouth daily.  Dispense: 30 tablet; Refill: 0 - buPROPion (WELLBUTRIN SR) 200 MG 12 hr tablet; Take 1 tablet (200 mg total) by mouth daily.  Dispense: 30 tablet; Refill: 0  4. Obesity, Current BMI 37.9 Gelila is currently in the action stage of change. As such, her goal is to continue with weight loss efforts. She has agreed to keeping a food journal and adhering to recommended goals of 1200-1400 calories and 75+ grams of protein daily.   Exercise goals: As is.  Behavioral modification strategies: increasing lean protein intake.  Annastasia has agreed to follow-up with our clinic in 4 weeks. She was informed of the importance of frequent follow-up visits to maximize her success with intensive lifestyle modifications for her multiple health conditions.   Objective:   Blood pressure 132/83, pulse 79, temperature 98.3 F (36.8 C), height '5\' 6"'$  (1.676 m), weight 234 lb (106.1 kg), SpO2 98 %. Body mass index is 37.77 kg/m.  General: Cooperative, alert, well developed, in no acute distress. HEENT: Conjunctivae and lids unremarkable. Cardiovascular: Regular rhythm.  Lungs: Normal work of breathing. Neurologic: No focal deficits.   Lab Results  Component Value Date   CREATININE 0.84 04/13/2022   BUN 26 04/13/2022   NA 143 04/13/2022   K 4.5 04/13/2022   CL 105 04/13/2022   CO2 21 04/13/2022   Lab Results  Component Value Date   ALT 14 04/13/2022   AST 17  04/13/2022   ALKPHOS 91 04/13/2022   BILITOT 0.5 04/13/2022   Lab Results  Component Value Date   HGBA1C 5.6 04/13/2022   HGBA1C 5.4 06/29/2021   HGBA1C 5.2 10/14/2020   HGBA1C 5.2 06/10/2020   HGBA1C 5.4 12/15/2019   Lab Results  Component Value Date   INSULIN 4.4 04/13/2022   INSULIN 7.0 06/29/2021   INSULIN 5.5 10/14/2020   INSULIN 5.4 06/10/2020   INSULIN 5.0 12/15/2019   Lab Results  Component Value Date   TSH 3.640 06/29/2021   Lab Results   Component Value Date   CHOL 221 (H) 04/13/2022   HDL 82 04/13/2022   LDLCALC 129 (H) 04/13/2022   TRIG 56 04/13/2022   CHOLHDL 3.1 09/27/2017   Lab Results  Component Value Date   VD25OH 38.1 04/13/2022   VD25OH 48.0 06/29/2021   VD25OH 52.4 10/14/2020   Lab Results  Component Value Date   WBC 4.7 04/13/2022   HGB 14.2 04/13/2022   HCT 43.6 04/13/2022   MCV 88 04/13/2022   PLT 237 04/13/2022   No results found for: "IRON", "TIBC", "FERRITIN"  Attestation Statements:   Reviewed by clinician on day of visit: allergies, medications, problem list, medical history, surgical history, family history, social history, and previous encounter notes.   I, Trixie Dredge, am acting as transcriptionist for Dennard Nip, MD.  I have reviewed the above documentation for accuracy and completeness, and I agree with the above. -  Dennard Nip, MD

## 2022-06-05 ENCOUNTER — Ambulatory Visit (INDEPENDENT_AMBULATORY_CARE_PROVIDER_SITE_OTHER): Payer: Federal, State, Local not specified - PPO | Admitting: Family Medicine

## 2022-06-08 DIAGNOSIS — H31091 Other chorioretinal scars, right eye: Secondary | ICD-10-CM | POA: Diagnosis not present

## 2022-06-08 DIAGNOSIS — H43391 Other vitreous opacities, right eye: Secondary | ICD-10-CM | POA: Diagnosis not present

## 2022-06-08 DIAGNOSIS — H43811 Vitreous degeneration, right eye: Secondary | ICD-10-CM | POA: Diagnosis not present

## 2022-06-12 ENCOUNTER — Encounter (INDEPENDENT_AMBULATORY_CARE_PROVIDER_SITE_OTHER): Payer: Self-pay | Admitting: Family Medicine

## 2022-06-12 ENCOUNTER — Ambulatory Visit (INDEPENDENT_AMBULATORY_CARE_PROVIDER_SITE_OTHER): Payer: Federal, State, Local not specified - PPO | Admitting: Family Medicine

## 2022-06-12 VITALS — BP 148/89 | HR 76 | Temp 98.3°F | Ht 66.0 in | Wt 232.0 lb

## 2022-06-12 DIAGNOSIS — S99922A Unspecified injury of left foot, initial encounter: Secondary | ICD-10-CM

## 2022-06-12 DIAGNOSIS — I10 Essential (primary) hypertension: Secondary | ICD-10-CM

## 2022-06-12 DIAGNOSIS — Z6837 Body mass index (BMI) 37.0-37.9, adult: Secondary | ICD-10-CM

## 2022-06-12 DIAGNOSIS — E669 Obesity, unspecified: Secondary | ICD-10-CM

## 2022-06-12 DIAGNOSIS — M1711 Unilateral primary osteoarthritis, right knee: Secondary | ICD-10-CM | POA: Diagnosis not present

## 2022-06-12 DIAGNOSIS — F3289 Other specified depressive episodes: Secondary | ICD-10-CM | POA: Diagnosis not present

## 2022-06-12 MED ORDER — MELOXICAM 15 MG PO TABS: ORAL_TABLET | ORAL | 0 refills | Status: DC

## 2022-06-12 MED ORDER — LOSARTAN POTASSIUM 100 MG PO TABS: ORAL_TABLET | ORAL | 0 refills | Status: DC

## 2022-06-12 MED ORDER — BUPROPION HCL ER (SR) 200 MG PO TB12: 200.0000 mg | ORAL_TABLET | Freq: Every day | ORAL | 0 refills | Status: DC

## 2022-06-12 NOTE — Progress Notes (Signed)
  Office: [phone]  /  Fax: [phone]   Total lbs lost to date: 49 Total lbs lost since last in-office visit: 2     BP (!) 148/89   Pulse 76   Temp 98.3 F (36.8 C)   Ht '5\' 6"'$  (1.676 m)   Wt 232 lb (105.2 kg)   SpO2 98%   BMI 37.45 kg/m  She was weighed on the bioimpedance scale:  Body mass index is 37.45 kg/m.  General:  Alert, oriented and cooperative. Patient is in no acute distress.  Mental Status: Normal mood and affect. Normal behavior. Normal judgment and thought content.      History of Present Illness The patient presents with a history of obesity, hypertension, arthritis, and emotional eating behaviors. They have been experiencing cravings, which they believe have improved recently. The patient has managed to lose 49 pounds since their first visit and is approaching a 50-pound weight loss milestone. They have been struggling with the availability of fresh vegetables during the winter months but have been able to maintain their protein intake.  The patient reports an increase in pain due to a recent injury to their left middle toe, which they believe may be broken. The toe is described as black and blue, and the patient has been taking Mobic to alleviate the pain. They mention that the medication is effective, especially when taken consistently and with food.  In terms of sleep, the patient has been sleeping well, although they have been taking Tylenol PM to help with congestion after a recent trip to the [place]. Despite the use of Tylenol PM, the patient states that their sleep has been generally good even without it.  The patient is aware of the upcoming holiday season and the potential for increased temptation during that time. They express a desire to maintain their current weight loss progress throughout the remainder of the year. The patient does not have their next appointment scheduled yet but plans to set up the next couple of appointments for the rest of the  year.  Assessment & Plan Weight Management: Patient has lost 49 pounds since the first visit, but is struggling with dietary temptations. -Encouraged to continue focusing on protein intake and managing cravings. -Set goal to maintain current weight loss, especially during the upcoming holiday season.  Toe Injury: Patient reports a painful, bruised middle toe on the left foot, possibly due to trauma. -Advised to use buddy taping and wear stiff-bottomed shoes to promote healing. -Continue Mobic for pain management, ensuring it is taken with food.  Hypertension: Managed with Losartan. -Refill Losartan prescription.  Emotional Eating: Managed with Bupropion. -Refill Bupropion prescription.  Sleep: Patient reports good sleep, occasionally using Tylenol PM for congestion. -Continue current sleep management strategies.  Follow-up in 3-4 weeks, with an additional appointment scheduled for the end of the year.  Patient past medical history includes:   Past Medical History:  Diagnosis Date   Allergy    Arthritis    Chest pain    Depression    Fatigue    GERD (gastroesophageal reflux disease)    Hyperlipidemia    Hypertension    Joint pain    Obesity    Osteopenia    Palpitation    Sleep apnea    uses CPAP    SOB (shortness of breath)    Urinary incontinence          Dennard Nip, MD

## 2022-07-11 ENCOUNTER — Ambulatory Visit (INDEPENDENT_AMBULATORY_CARE_PROVIDER_SITE_OTHER): Payer: Federal, State, Local not specified - PPO | Admitting: Family Medicine

## 2022-07-11 ENCOUNTER — Encounter (INDEPENDENT_AMBULATORY_CARE_PROVIDER_SITE_OTHER): Payer: Self-pay | Admitting: Family Medicine

## 2022-07-11 VITALS — BP 160/98 | HR 76 | Temp 97.5°F | Ht 66.0 in | Wt 229.8 lb

## 2022-07-11 DIAGNOSIS — Z6837 Body mass index (BMI) 37.0-37.9, adult: Secondary | ICD-10-CM

## 2022-07-11 DIAGNOSIS — E669 Obesity, unspecified: Secondary | ICD-10-CM | POA: Diagnosis not present

## 2022-07-11 DIAGNOSIS — F3289 Other specified depressive episodes: Secondary | ICD-10-CM | POA: Diagnosis not present

## 2022-07-11 DIAGNOSIS — E559 Vitamin D deficiency, unspecified: Secondary | ICD-10-CM

## 2022-07-11 DIAGNOSIS — I10 Essential (primary) hypertension: Secondary | ICD-10-CM | POA: Diagnosis not present

## 2022-07-11 MED ORDER — VENLAFAXINE HCL 75 MG PO TABS: 75.0000 mg | ORAL_TABLET | Freq: Every day | ORAL | 0 refills | Status: DC

## 2022-07-11 MED ORDER — BUPROPION HCL ER (SR) 200 MG PO TB12: 200.0000 mg | ORAL_TABLET | Freq: Every day | ORAL | 0 refills | Status: DC

## 2022-07-11 MED ORDER — VITAMIN D3 25 MCG (1000 UT) PO CAPS: 1.0000 | ORAL_CAPSULE | Freq: Every day | ORAL | 0 refills | Status: DC

## 2022-07-11 MED ORDER — LOSARTAN POTASSIUM 100 MG PO TABS: ORAL_TABLET | ORAL | 0 refills | Status: DC

## 2022-07-17 ENCOUNTER — Other Ambulatory Visit (INDEPENDENT_AMBULATORY_CARE_PROVIDER_SITE_OTHER): Payer: Self-pay | Admitting: Family Medicine

## 2022-07-17 DIAGNOSIS — F3289 Other specified depressive episodes: Secondary | ICD-10-CM

## 2022-07-17 DIAGNOSIS — M1711 Unilateral primary osteoarthritis, right knee: Secondary | ICD-10-CM

## 2022-07-19 NOTE — Progress Notes (Signed)
Chief Complaint:   OBESITY Cheryl [name] is here to discuss her progress with her obesity treatment plan along with follow-up of her obesity related diagnoses. Ethylene is on keeping a food journal and adhering to recommended goals of 1200-1400 calories and 75+ grams of protein and states she is following her eating plan approximately 25% of the time. Cheryl [name] states she is doing 0 minutes 0 times per week.  Today's visit was #: 7 Starting weight: 281 lbs Starting date: 12/10/2017 Today's weight: 229 lbs Today's date: 07/11/2022 Total lbs lost to date: 52 Total lbs lost since last in-office visit: 3  Interim History: Cheryl [name] did very well with her weight loss even over Thanksgiving.  Her hunger is controlled and she is doing well with increasing her protein.  Her husband since her frequently, but she is making strategies to deal with this.  Subjective:   1. Essential hypertension Teigen's blood pressure is above goal today, but normally better controlled.  2. Vitamin D deficiency Cheryl [name]'s last vitamin D level was not at goal.  She is on vitamin D prescription and her level has been at goal previously.  3. Other depression with emotional eating Cheryl [name]'s mood is stable on her medications.  She is doing well with decreasing emotional eating behaviors, and she is mindful of her temptations and challenges.  Assessment/Plan:   1. Essential hypertension Cheryl [name] will continue Cozaar 100 mg once daily, and we will refill for 90 days.  We will recheck her blood pressure at her next visit in 3 weeks.  - losartan (COZAAR) 100 MG tablet; TAKE 1 TABLET(100 MG) BY MOUTH DAILY  Dispense: 90 tablet; Refill: 0  2. Vitamin D deficiency Cheryl [name] will continue vitamin D3 1000 units daily, and we will refill for 2 months.  - Cholecalciferol (VITAMIN D3) 25 MCG (1000 UT) CAPS; Take 1 capsule (1,000 Units total) by mouth daily.  Dispense: 60 capsule; Refill: 0  3. Other depression with emotional  eating Cheryl [name] will continue her medications, and we will refill Wellbutrin SR and Effexor for 1 month.  - buPROPion (WELLBUTRIN SR) 200 MG 12 hr tablet; Take 1 tablet (200 mg total) by mouth daily.  Dispense: 30 tablet; Refill: 0 - venlafaxine (EFFEXOR) 75 MG tablet; Take 1 tablet (75 mg total) by mouth daily.  Dispense: 30 tablet; Refill: 0  4. Obesity, Current BMI 37.1 Cheryl [name] is currently in the action stage of change. As such, her goal is to continue with weight loss efforts. She has agreed to keeping a food journal and adhering to recommended goals of 1200-1400 calories and 75+ grams of protein daily.   Behavioral modification strategies: increasing lean protein intake.  Cheryl [name] has agreed to follow-up with our clinic in 3 weeks. She was informed of the importance of frequent follow-up visits to maximize her success with intensive lifestyle modifications for her multiple health conditions.   Objective:   Blood pressure (!) 160/98, pulse 76, temperature (!) 97.5 F (36.4 C), height '5\' 6"'$  (1.676 m), weight 229 lb 12.8 oz (104.2 kg), SpO2 99 %. Body mass index is 37.09 kg/m.  General: Cooperative, alert, well developed, in no acute distress. HEENT: Conjunctivae and lids unremarkable. Cardiovascular: Regular rhythm.  Lungs: Normal work of breathing. Neurologic: No focal deficits.   Lab Results  Component Value Date   CREATININE 0.84 04/13/2022   BUN 26 04/13/2022   NA 143 04/13/2022   K 4.5 04/13/2022   CL 105 04/13/2022   CO2 21 04/13/2022   Lab Results  Component Value Date   ALT 14 04/13/2022   AST 17 04/13/2022   ALKPHOS 91 04/13/2022   BILITOT 0.5 04/13/2022   Lab Results  Component Value Date   HGBA1C 5.6 04/13/2022   HGBA1C 5.4 06/29/2021   HGBA1C 5.2 10/14/2020   HGBA1C 5.2 06/10/2020   HGBA1C 5.4 12/15/2019   Lab Results  Component Value Date   INSULIN 4.4 04/13/2022   INSULIN 7.0 06/29/2021   INSULIN 5.5 10/14/2020   INSULIN 5.4 06/10/2020   INSULIN  5.0 12/15/2019   Lab Results  Component Value Date   TSH 3.640 06/29/2021   Lab Results  Component Value Date   CHOL 221 (H) 04/13/2022   HDL 82 04/13/2022   LDLCALC 129 (H) 04/13/2022   TRIG 56 04/13/2022   CHOLHDL 3.1 09/27/2017   Lab Results  Component Value Date   VD25OH 38.1 04/13/2022   VD25OH 48.0 06/29/2021   VD25OH 52.4 10/14/2020   Lab Results  Component Value Date   WBC 4.7 04/13/2022   HGB 14.2 04/13/2022   HCT 43.6 04/13/2022   MCV 88 04/13/2022   PLT 237 04/13/2022   No results found for: "IRON", "TIBC", "FERRITIN"  Attestation Statements:   Reviewed by clinician on day of visit: allergies, medications, problem list, medical history, surgical history, family history, social history, and previous encounter notes.   I, Trixie Dredge, am acting as transcriptionist for Dennard Nip, MD.  I have reviewed the above documentation for accuracy and completeness, and I agree with the above. -  Dennard Nip, MD

## 2022-07-21 DIAGNOSIS — H43811 Vitreous degeneration, right eye: Secondary | ICD-10-CM | POA: Diagnosis not present

## 2022-07-21 DIAGNOSIS — H338 Other retinal detachments: Secondary | ICD-10-CM | POA: Diagnosis not present

## 2022-07-21 DIAGNOSIS — H43391 Other vitreous opacities, right eye: Secondary | ICD-10-CM | POA: Diagnosis not present

## 2022-07-28 DIAGNOSIS — H31001 Unspecified chorioretinal scars, right eye: Secondary | ICD-10-CM | POA: Diagnosis not present

## 2022-07-28 DIAGNOSIS — H43391 Other vitreous opacities, right eye: Secondary | ICD-10-CM | POA: Diagnosis not present

## 2022-07-28 DIAGNOSIS — H33301 Unspecified retinal break, right eye: Secondary | ICD-10-CM | POA: Diagnosis not present

## 2022-07-28 DIAGNOSIS — Z9889 Other specified postprocedural states: Secondary | ICD-10-CM | POA: Diagnosis not present

## 2022-08-01 ENCOUNTER — Ambulatory Visit (INDEPENDENT_AMBULATORY_CARE_PROVIDER_SITE_OTHER): Payer: Federal, State, Local not specified - PPO | Admitting: Family Medicine

## 2022-08-01 ENCOUNTER — Encounter (INDEPENDENT_AMBULATORY_CARE_PROVIDER_SITE_OTHER): Payer: Self-pay | Admitting: Family Medicine

## 2022-08-01 VITALS — BP 148/81 | HR 69 | Temp 98.3°F | Ht 66.0 in | Wt 231.0 lb

## 2022-08-01 DIAGNOSIS — I10 Essential (primary) hypertension: Secondary | ICD-10-CM | POA: Diagnosis not present

## 2022-08-01 DIAGNOSIS — E669 Obesity, unspecified: Secondary | ICD-10-CM

## 2022-08-01 DIAGNOSIS — F3289 Other specified depressive episodes: Secondary | ICD-10-CM

## 2022-08-01 DIAGNOSIS — Z6837 Body mass index (BMI) 37.0-37.9, adult: Secondary | ICD-10-CM

## 2022-08-01 DIAGNOSIS — R7303 Prediabetes: Secondary | ICD-10-CM

## 2022-08-01 MED ORDER — LOSARTAN POTASSIUM-HCTZ 100-12.5 MG PO TABS: 1.0000 | ORAL_TABLET | Freq: Every day | ORAL | 0 refills | Status: DC

## 2022-08-01 MED ORDER — METFORMIN HCL 500 MG PO TABS: 500.0000 mg | ORAL_TABLET | Freq: Two times a day (BID) | ORAL | 0 refills | Status: DC

## 2022-08-01 MED ORDER — BUPROPION HCL ER (SR) 200 MG PO TB12: 200.0000 mg | ORAL_TABLET | Freq: Every day | ORAL | 0 refills | Status: DC

## 2022-08-22 NOTE — Progress Notes (Signed)
Chief Complaint:   OBESITY Cheryl [name] is here to discuss her progress with her obesity treatment plan along with follow-up of her obesity related diagnoses. [name] is on keeping a food journal and adhering to recommended goals of 1200-1400 calories and 75+ grams of protein and states she is following her eating plan approximately 25% of the time. [name] states she is doing 0 minutes 0 times per week.  Today's visit was #: 47 Starting weight: 281 lbs Starting date: 12/10/2017 Today's weight: 231 lbs Today's date: 08/01/2022 Total lbs lost to date: 50 Total lbs lost since last in-office visit: 0  Interim History: [name] has done well with minimizing her weight gain, despite a lot of extra temptations.  She is working on avoiding weight gain over Christmas.  Subjective:   1. Essential hypertension [name]'s blood pressure remains elevated.  She states her stress level is high, and she denies problems with losartan.  2. Prediabetes [name] is doing well on Wellbutrin and she is working on her diet.  She denies side effects.  3. Other depression with emotional eating [name] has indulged more with simple carbohydrates due to increased temptations, but not so much due to increased cravings.  No side effects were noted on Wellbutrin.  Assessment/Plan:   1. Essential hypertension Earnie agreed to discontinue losartan, and start Hyzaar 100-12.5 mg once daily with a 90-day supply.  - losartan-hydrochlorothiazide (HYZAAR) 100-12.5 MG tablet; Take 1 tablet by mouth daily.  Dispense: 90 tablet; Refill: 0  2. Prediabetes [name] will continue metformin 500 mg twice daily, and we will refill for 90 days.  - metFORMIN (GLUCOPHAGE) 500 MG tablet; Take 1 tablet (500 mg total) by mouth 2 (two) times daily with a meal.  Dispense: 180 tablet; Refill: 0  3. Other depression with emotional eating [name] will continue Wellbutrin SR 200 mg once daily, and we will refill for 90 days.  - buPROPion  (WELLBUTRIN SR) 200 MG 12 hr tablet; Take 1 tablet (200 mg total) by mouth daily.  Dispense: 90 tablet; Refill: 0  4. Obesity, Current BMI 37.4 [name] is currently in the action stage of change. As such, her goal is to continue with weight loss efforts. She has agreed to keeping a food journal and adhering to recommended goals of 1200-1400 calories and 75+ grams of protein daily.   Behavioral modification strategies: holiday eating strategies .  [name] has agreed to follow-up with our clinic in 6 weeks. She was informed of the importance of frequent follow-up visits to maximize her success with intensive lifestyle modifications for her multiple health conditions.   Objective:   Blood pressure (!) 148/81, pulse 69, temperature 98.3 F (36.8 C), height '5\' 6"'$  (1.676 m), weight 231 lb (104.8 kg), SpO2 99 %. Body mass index is 37.28 kg/m.  General: Cooperative, alert, well developed, in no acute distress. HEENT: Conjunctivae and lids unremarkable. Cardiovascular: Regular rhythm.  Lungs: Normal work of breathing. Neurologic: No focal deficits.   Lab Results  Component Value Date   CREATININE 0.84 04/13/2022   BUN 26 04/13/2022   NA 143 04/13/2022   K 4.5 04/13/2022   CL 105 04/13/2022   CO2 21 04/13/2022   Lab Results  Component Value Date   ALT 14 04/13/2022   AST 17 04/13/2022   ALKPHOS 91 04/13/2022   BILITOT 0.5 04/13/2022   Lab Results  Component Value Date   HGBA1C 5.6 04/13/2022   HGBA1C 5.4 06/29/2021   HGBA1C 5.2 10/14/2020   HGBA1C 5.2  06/10/2020   HGBA1C 5.4 12/15/2019   Lab Results  Component Value Date   INSULIN 4.4 04/13/2022   INSULIN 7.0 06/29/2021   INSULIN 5.5 10/14/2020   INSULIN 5.4 06/10/2020   INSULIN 5.0 12/15/2019   Lab Results  Component Value Date   TSH 3.640 06/29/2021   Lab Results  Component Value Date   CHOL 221 (H) 04/13/2022   HDL 82 04/13/2022   LDLCALC 129 (H) 04/13/2022   TRIG 56 04/13/2022   CHOLHDL 3.1 09/27/2017   Lab  Results  Component Value Date   VD25OH 38.1 04/13/2022   VD25OH 48.0 06/29/2021   VD25OH 52.4 10/14/2020   Lab Results  Component Value Date   WBC 4.7 04/13/2022   HGB 14.2 04/13/2022   HCT 43.6 04/13/2022   MCV 88 04/13/2022   PLT 237 04/13/2022   No results found for: "IRON", "TIBC", "FERRITIN"  Attestation Statements:   Reviewed by clinician on day of visit: allergies, medications, problem list, medical history, surgical history, family history, social history, and previous encounter notes.   I, Trixie Dredge, am acting as transcriptionist for Dennard Nip, MD.  I have reviewed the above documentation for accuracy and completeness, and I agree with the above. -  Dennard Nip, MD

## 2022-08-29 ENCOUNTER — Ambulatory Visit (INDEPENDENT_AMBULATORY_CARE_PROVIDER_SITE_OTHER): Payer: Federal, State, Local not specified - PPO | Admitting: Family Medicine

## 2022-09-13 ENCOUNTER — Ambulatory Visit (INDEPENDENT_AMBULATORY_CARE_PROVIDER_SITE_OTHER): Payer: Federal, State, Local not specified - PPO | Admitting: Family Medicine

## 2022-09-20 ENCOUNTER — Ambulatory Visit (INDEPENDENT_AMBULATORY_CARE_PROVIDER_SITE_OTHER): Payer: Medicare HMO | Admitting: Family Medicine

## 2022-09-20 ENCOUNTER — Encounter (INDEPENDENT_AMBULATORY_CARE_PROVIDER_SITE_OTHER): Payer: Self-pay | Admitting: Family Medicine

## 2022-09-20 VITALS — BP 153/66 | HR 89 | Temp 97.9°F | Ht 66.0 in | Wt 230.0 lb

## 2022-09-20 DIAGNOSIS — E669 Obesity, unspecified: Secondary | ICD-10-CM | POA: Diagnosis not present

## 2022-09-20 DIAGNOSIS — R5383 Other fatigue: Secondary | ICD-10-CM | POA: Diagnosis not present

## 2022-09-20 DIAGNOSIS — Z6837 Body mass index (BMI) 37.0-37.9, adult: Secondary | ICD-10-CM | POA: Diagnosis not present

## 2022-09-20 DIAGNOSIS — M1711 Unilateral primary osteoarthritis, right knee: Secondary | ICD-10-CM

## 2022-09-20 MED ORDER — MELOXICAM 15 MG PO TABS: ORAL_TABLET | ORAL | 0 refills | Status: DC

## 2022-09-21 DIAGNOSIS — H33311 Horseshoe tear of retina without detachment, right eye: Secondary | ICD-10-CM | POA: Diagnosis not present

## 2022-09-21 DIAGNOSIS — Z9889 Other specified postprocedural states: Secondary | ICD-10-CM | POA: Diagnosis not present

## 2022-09-21 DIAGNOSIS — H43391 Other vitreous opacities, right eye: Secondary | ICD-10-CM | POA: Diagnosis not present

## 2022-09-21 DIAGNOSIS — H59811 Chorioretinal scars after surgery for detachment, right eye: Secondary | ICD-10-CM | POA: Diagnosis not present

## 2022-10-04 NOTE — Progress Notes (Signed)
Chief Complaint:   OBESITY Cheryl [name] is here to discuss her progress with her obesity treatment plan along with follow-up of her obesity related diagnoses. Cheryl [name] is on keeping a food journal and adhering to recommended goals of 1200-1400 calories and 75+ grams of protein and states she is following her eating plan approximately 25% of the time. Cheryl [name] states she is walking for 30 minutes 1 time per week.  Today's visit was #: 89 Starting weight: 281 lbs Starting date: 12/10/2017 Today's weight: 230 lbs Today's date: 09/20/2022 Total lbs lost to date: 51 Total lbs lost since last in-office visit: 1  Interim History: Cheryl [name] has struggled more with following her plan. She notes some increased stress and stress eating, but tried to increase protein to mitigate the extra calories.   Subjective:   1. Other fatigue Cheryl [name] notes waking up unrefreshed. She sleeps approximately 6 hours which is her normal. She uses her CPAP nightly.    2. Osteoarthritis of right knee, unspecified osteoarthritis type Cheryl [name] is getting pain relief from Mobic. She makes sure to take it with food.   Assessment/Plan:   1. Other fatigue Cheryl [name] is to contact her sleep specialist as her CPAP settings may not be optimized right now.   2. Osteoarthritis of right knee, unspecified osteoarthritis type Cheryl [name] will continue Mobic, and we will rfill for 1 month.   - meloxicam (MOBIC) 15 MG tablet; TAKE 1 TABLET(15 MG) BY MOUTH AT BEDTIME  Dispense: 30 tablet; Refill: 0  3. BMI 37.0-37.9, adult  4. Obesity, Beginning BMI 45.35 Cheryl [name] is currently in the action stage of change. As such, her goal is to continue with weight loss efforts. She has agreed to change to the Category 2 Plan.   Exercise goals: As is.   Behavioral modification strategies: increasing lean protein intake and emotional eating strategies.  Cheryl [name] has agreed to follow-up with our clinic in 4 weeks. She was informed of the importance of frequent  follow-up visits to maximize her success with intensive lifestyle modifications for her multiple health conditions.   Objective:   Blood pressure (!) 153/66, pulse 89, temperature 97.9 F (36.6 C), height 5' 6"$  (1.676 m), weight 230 lb (104.3 kg), SpO2 98 %. Body mass index is 37.12 kg/m.  General: Cooperative, alert, well developed, in no acute distress. HEENT: Conjunctivae and lids unremarkable. Cardiovascular: Regular rhythm.  Lungs: Normal work of breathing. Neurologic: No focal deficits.   Lab Results  Component Value Date   CREATININE 0.84 04/13/2022   BUN 26 04/13/2022   NA 143 04/13/2022   K 4.5 04/13/2022   CL 105 04/13/2022   CO2 21 04/13/2022   Lab Results  Component Value Date   ALT 14 04/13/2022   AST 17 04/13/2022   ALKPHOS 91 04/13/2022   BILITOT 0.5 04/13/2022   Lab Results  Component Value Date   HGBA1C 5.6 04/13/2022   HGBA1C 5.4 06/29/2021   HGBA1C 5.2 10/14/2020   HGBA1C 5.2 06/10/2020   HGBA1C 5.4 12/15/2019   Lab Results  Component Value Date   INSULIN 4.4 04/13/2022   INSULIN 7.0 06/29/2021   INSULIN 5.5 10/14/2020   INSULIN 5.4 06/10/2020   INSULIN 5.0 12/15/2019   Lab Results  Component Value Date   TSH 3.640 06/29/2021   Lab Results  Component Value Date   CHOL 221 (H) 04/13/2022   HDL 82 04/13/2022   LDLCALC 129 (H) 04/13/2022   TRIG 56 04/13/2022   CHOLHDL 3.1 09/27/2017   Lab Results  Component Value Date   VD25OH 38.1 04/13/2022   VD25OH 48.0 06/29/2021   VD25OH 52.4 10/14/2020   Lab Results  Component Value Date   WBC 4.7 04/13/2022   HGB 14.2 04/13/2022   HCT 43.6 04/13/2022   MCV 88 04/13/2022   PLT 237 04/13/2022   No results found for: "IRON", "TIBC", "FERRITIN"  Attestation Statements:   Reviewed by clinician on day of visit: allergies, medications, problem list, medical history, surgical history, family history, social history, and previous encounter notes.   I, Trixie Dredge, am acting as  transcriptionist for Dennard Nip, MD.  I have reviewed the above documentation for accuracy and completeness, and I agree with the above. -  Dennard Nip, MD

## 2022-10-18 ENCOUNTER — Encounter (INDEPENDENT_AMBULATORY_CARE_PROVIDER_SITE_OTHER): Payer: Self-pay | Admitting: Family Medicine

## 2022-10-18 ENCOUNTER — Other Ambulatory Visit (INDEPENDENT_AMBULATORY_CARE_PROVIDER_SITE_OTHER): Payer: Self-pay | Admitting: Family Medicine

## 2022-10-18 ENCOUNTER — Ambulatory Visit (INDEPENDENT_AMBULATORY_CARE_PROVIDER_SITE_OTHER): Payer: Medicare HMO | Admitting: Family Medicine

## 2022-10-18 VITALS — BP 135/80 | HR 73 | Temp 98.0°F | Ht 66.0 in | Wt 235.0 lb

## 2022-10-18 DIAGNOSIS — Z6836 Body mass index (BMI) 36.0-36.9, adult: Secondary | ICD-10-CM | POA: Insufficient documentation

## 2022-10-18 DIAGNOSIS — I10 Essential (primary) hypertension: Secondary | ICD-10-CM

## 2022-10-18 DIAGNOSIS — R7303 Prediabetes: Secondary | ICD-10-CM

## 2022-10-18 DIAGNOSIS — E669 Obesity, unspecified: Secondary | ICD-10-CM

## 2022-10-18 DIAGNOSIS — Z6835 Body mass index (BMI) 35.0-35.9, adult: Secondary | ICD-10-CM | POA: Insufficient documentation

## 2022-10-18 DIAGNOSIS — F3289 Other specified depressive episodes: Secondary | ICD-10-CM

## 2022-10-18 DIAGNOSIS — Z6838 Body mass index (BMI) 38.0-38.9, adult: Secondary | ICD-10-CM | POA: Diagnosis not present

## 2022-10-18 MED ORDER — METFORMIN HCL 500 MG PO TABS: 500.0000 mg | ORAL_TABLET | Freq: Two times a day (BID) | ORAL | 0 refills | Status: DC

## 2022-10-18 MED ORDER — LOSARTAN POTASSIUM-HCTZ 100-12.5 MG PO TABS: 1.0000 | ORAL_TABLET | Freq: Every day | ORAL | 0 refills | Status: DC

## 2022-10-18 MED ORDER — BUPROPION HCL ER (SR) 200 MG PO TB12: 200.0000 mg | ORAL_TABLET | Freq: Every day | ORAL | 0 refills | Status: DC

## 2022-10-23 DIAGNOSIS — M8589 Other specified disorders of bone density and structure, multiple sites: Secondary | ICD-10-CM | POA: Diagnosis not present

## 2022-10-23 DIAGNOSIS — Z803 Family history of malignant neoplasm of breast: Secondary | ICD-10-CM | POA: Diagnosis not present

## 2022-10-23 DIAGNOSIS — Z1231 Encounter for screening mammogram for malignant neoplasm of breast: Secondary | ICD-10-CM | POA: Diagnosis not present

## 2022-10-31 DIAGNOSIS — R921 Mammographic calcification found on diagnostic imaging of breast: Secondary | ICD-10-CM | POA: Diagnosis not present

## 2022-10-31 DIAGNOSIS — N6001 Solitary cyst of right breast: Secondary | ICD-10-CM | POA: Diagnosis not present

## 2022-10-31 DIAGNOSIS — R928 Other abnormal and inconclusive findings on diagnostic imaging of breast: Secondary | ICD-10-CM | POA: Diagnosis not present

## 2022-10-31 NOTE — Progress Notes (Unsigned)
Chief Complaint:   OBESITY Cheryl [name] is here to discuss her progress with her obesity treatment plan along with follow-up of her obesity related diagnoses. Cheryl [name] is on the Category 2 Plan and states she is following her eating plan approximately 50% of the time. Cheryl [name] states she is doing 0 minutes 0 times per week.  Today's visit was #: 54 Starting weight: 281 lbs Starting date: 12/10/2017 Today's weight: 235 lbs Today's date: 10/18/2022 Total lbs lost to date: 46 Total lbs lost since last in-office visit: 0  Interim History: Cheryl [name] has had a lot of family stress with her father going into Hospice at home. She has increased work stress as well and she hasn't been able to concentrate on meal planning.   Subjective:   1. Essential hypertension Cheryl [name]'s blood pressure is well controlled on losartan. She denies chest pain or headache.   2. Prediabetes Cheryl [name] will continue to work on weight loss and she is tolerating metformin well. She denies nausea or vomiting.   3. Emotional Eating Behavior Cheryl [name] is working on decreasing emotional eating behaviors, but she has had extra challenges recently. She is doing well on her medications.   Assessment/Plan:   1. Essential hypertension Tyli will continue Hyzaar, and we will refill for 1 month.   - losartan-hydrochlorothiazide (HYZAAR) 100-12.5 MG tablet; Take 1 tablet by mouth daily.  Dispense: 30 tablet; Refill: 0  2. Prediabetes Cheryl [name] will continue metformin, and we will refill for 1 month.   - metFORMIN (GLUCOPHAGE) 500 MG tablet; Take 1 tablet (500 mg total) by mouth 2 (two) times daily with a meal.  Dispense: 60 tablet; Refill: 0  3. Emotional Eating Behavior Delight will continue Wellbutrin SR, and we will refill for 1 month.   - buPROPion (WELLBUTRIN SR) 200 MG 12 hr tablet; Take 1 tablet (200 mg total) by mouth daily.  Dispense: 30 tablet; Refill: 0  4. BMI 38.0-38.9,adult  5. Obesity, Beginning BMI 45.35 Cheryl [name] is  currently in the action stage of change. As such, her goal is to continue with weight loss efforts. She has agreed to the Category 2 Plan.   Behavioral modification strategies: no skipping meals and emotional eating strategies.  Cheryl [name] has agreed to follow-up with our clinic in 4 weeks. She was informed of the importance of frequent follow-up visits to maximize her success with intensive lifestyle modifications for her multiple health conditions.   Objective:   Blood pressure 135/80, pulse 73, temperature 98 F (36.7 C), height 5\' 6"  (1.676 m), weight 235 lb (106.6 kg), SpO2 96 %. Body mass index is 37.93 kg/m.  Lab Results  Component Value Date   CREATININE 0.84 04/13/2022   BUN 26 04/13/2022   NA 143 04/13/2022   K 4.5 04/13/2022   CL 105 04/13/2022   CO2 21 04/13/2022   Lab Results  Component Value Date   ALT 14 04/13/2022   AST 17 04/13/2022   ALKPHOS 91 04/13/2022   BILITOT 0.5 04/13/2022   Lab Results  Component Value Date   HGBA1C 5.6 04/13/2022   HGBA1C 5.4 06/29/2021   HGBA1C 5.2 10/14/2020   HGBA1C 5.2 06/10/2020   HGBA1C 5.4 12/15/2019   Lab Results  Component Value Date   INSULIN 4.4 04/13/2022   INSULIN 7.0 06/29/2021   INSULIN 5.5 10/14/2020   INSULIN 5.4 06/10/2020   INSULIN 5.0 12/15/2019   Lab Results  Component Value Date   TSH 3.640 06/29/2021   Lab Results  Component Value Date  CHOL 221 (H) 04/13/2022   HDL 82 04/13/2022   LDLCALC 129 (H) 04/13/2022   TRIG 56 04/13/2022   CHOLHDL 3.1 09/27/2017   Lab Results  Component Value Date   VD25OH 38.1 04/13/2022   VD25OH 48.0 06/29/2021   VD25OH 52.4 10/14/2020   Lab Results  Component Value Date   WBC 4.7 04/13/2022   HGB 14.2 04/13/2022   HCT 43.6 04/13/2022   MCV 88 04/13/2022   PLT 237 04/13/2022   No results found for: "IRON", "TIBC", "FERRITIN"  Attestation Statements:   Reviewed by clinician on day of visit: allergies, medications, problem list, medical history, surgical  history, family history, social history, and previous encounter notes.   I, Trixie Dredge, am acting as transcriptionist for Dennard Nip, MD.  I have reviewed the above documentation for accuracy and completeness, and I agree with the above. -  Dennard Nip, MD

## 2022-11-02 ENCOUNTER — Encounter (HOSPITAL_BASED_OUTPATIENT_CLINIC_OR_DEPARTMENT_OTHER): Payer: Self-pay | Admitting: *Deleted

## 2022-11-16 ENCOUNTER — Encounter (INDEPENDENT_AMBULATORY_CARE_PROVIDER_SITE_OTHER): Payer: Self-pay | Admitting: Family Medicine

## 2022-11-16 ENCOUNTER — Ambulatory Visit (INDEPENDENT_AMBULATORY_CARE_PROVIDER_SITE_OTHER): Payer: Medicare HMO | Admitting: Family Medicine

## 2022-11-16 ENCOUNTER — Other Ambulatory Visit (INDEPENDENT_AMBULATORY_CARE_PROVIDER_SITE_OTHER): Payer: Self-pay | Admitting: Family Medicine

## 2022-11-16 VITALS — BP 129/83 | HR 68 | Ht 66.0 in | Wt 238.0 lb

## 2022-11-16 DIAGNOSIS — E559 Vitamin D deficiency, unspecified: Secondary | ICD-10-CM | POA: Diagnosis not present

## 2022-11-16 DIAGNOSIS — M1711 Unilateral primary osteoarthritis, right knee: Secondary | ICD-10-CM | POA: Diagnosis not present

## 2022-11-16 DIAGNOSIS — E669 Obesity, unspecified: Secondary | ICD-10-CM | POA: Diagnosis not present

## 2022-11-16 DIAGNOSIS — F3289 Other specified depressive episodes: Secondary | ICD-10-CM

## 2022-11-16 DIAGNOSIS — I10 Essential (primary) hypertension: Secondary | ICD-10-CM

## 2022-11-16 DIAGNOSIS — E7800 Pure hypercholesterolemia, unspecified: Secondary | ICD-10-CM

## 2022-11-16 DIAGNOSIS — R7303 Prediabetes: Secondary | ICD-10-CM

## 2022-11-16 DIAGNOSIS — Z6838 Body mass index (BMI) 38.0-38.9, adult: Secondary | ICD-10-CM

## 2022-11-16 MED ORDER — METFORMIN HCL 500 MG PO TABS: 500.0000 mg | ORAL_TABLET | Freq: Two times a day (BID) | ORAL | 0 refills | Status: DC

## 2022-11-16 MED ORDER — BUPROPION HCL ER (SR) 200 MG PO TB12: 200.0000 mg | ORAL_TABLET | Freq: Every day | ORAL | 0 refills | Status: DC

## 2022-11-16 MED ORDER — MELOXICAM 15 MG PO TABS: ORAL_TABLET | ORAL | 0 refills | Status: DC

## 2022-11-16 MED ORDER — LOSARTAN POTASSIUM-HCTZ 100-12.5 MG PO TABS: 1.0000 | ORAL_TABLET | Freq: Every day | ORAL | 0 refills | Status: DC

## 2022-11-16 MED ORDER — VENLAFAXINE HCL 75 MG PO TABS: 75.0000 mg | ORAL_TABLET | Freq: Every day | ORAL | 0 refills | Status: DC

## 2022-11-17 LAB — LIPID PANEL WITH LDL/HDL RATIO
Cholesterol, Total: 241 mg/dL — ABNORMAL HIGH (ref 100–199)
HDL: 82 mg/dL (ref >39)
LDL Chol Calc (NIH): 148 mg/dL — ABNORMAL HIGH (ref 0–99)
LDL/HDL Ratio: 1.8 ratio (ref 0.0–3.2)
Triglycerides: 64 mg/dL (ref 0–149)
VLDL Cholesterol Cal: 11 mg/dL (ref 5–40)

## 2022-11-17 LAB — HEMOGLOBIN A1C
Est. average glucose Bld gHb Est-mCnc: 117 mg/dL
Hgb A1c MFr Bld: 5.7 % — ABNORMAL HIGH (ref 4.8–5.6)

## 2022-11-17 LAB — INSULIN, RANDOM: INSULIN: 7 u[IU]/mL (ref 2.6–24.9)

## 2022-11-17 LAB — CMP14+EGFR
ALT: 17 IU/L (ref 0–32)
AST: 18 IU/L (ref 0–40)
Albumin/Globulin Ratio: 1.8 (ref 1.2–2.2)
Albumin: 4.4 g/dL (ref 3.9–4.9)
Alkaline Phosphatase: 97 IU/L (ref 44–121)
BUN/Creatinine Ratio: 31 — ABNORMAL HIGH (ref 12–28)
BUN: 29 mg/dL — ABNORMAL HIGH (ref 8–27)
Bilirubin Total: 0.4 mg/dL (ref 0.0–1.2)
CO2: 23 mmol/L (ref 20–29)
Calcium: 9.3 mg/dL (ref 8.7–10.3)
Chloride: 101 mmol/L (ref 96–106)
Creatinine, Ser: 0.95 mg/dL (ref 0.57–1.00)
Globulin, Total: 2.4 g/dL (ref 1.5–4.5)
Glucose: 88 mg/dL (ref 70–99)
Potassium: 4.5 mmol/L (ref 3.5–5.2)
Sodium: 140 mmol/L (ref 134–144)
Total Protein: 6.8 g/dL (ref 6.0–8.5)
eGFR: 66 mL/min/{1.73_m2} (ref >59)

## 2022-11-17 LAB — VITAMIN B12: Vitamin B-12: 445 pg/mL (ref 232–1245)

## 2022-11-17 LAB — VITAMIN D 25 HYDROXY (VIT D DEFICIENCY, FRACTURES): Vit D, 25-Hydroxy: 50.4 ng/mL (ref 30.0–100.0)

## 2022-11-17 LAB — TSH: TSH: 3.68 u[IU]/mL (ref 0.450–4.500)

## 2022-11-20 NOTE — Progress Notes (Signed)
Chief Complaint:   OBESITY Cheryl [name] is here to discuss her progress with her obesity treatment plan along with follow-up of her obesity related diagnoses. Cheryl [name] is on the Category 2 Plan and states she is following her eating plan approximately 25% of the time. Cheryl [name] states she is walking for 15 minutes 2 times per week.  Today's visit was #: 74 Starting weight: 281 lbs Starting date: 02/06/2018 Today's weight: 238 lbs Today's date: 11/16/2022 Total lbs lost to date: 43 Total lbs lost since last in-office visit: 0  Interim History: [name] has a lot of stress with her family and work. She is struggling with meal planning more.   Subjective:   1. Pure hypercholesterolemia [name] is not on statin, and she is working on her diet. She denies chest pain.   2. Vitamin D deficiency Cheryl [name] is on Vitamin D prescription, and she is due for labs.   3. Essential hypertension Kaysie's blood pressure is well controlled with her medications, and decreasing sodium in her diet.   4. Osteoarthritis of right knee, unspecified osteoarthritis type Cheryl [name] is working on increasing working and weight loss to help with pain.  She notes meloxicam has helped decrease her pain.   5. Prediabetes Cheryl [name] is working on her diet and decreasing sugar, and she is due for labs.   6. Emotional Eating Behavior Cheryl [name] has increased stress with her father in hospice. She is trying to spending quality time with him and working on minimizing emotional eating behavior.    Assessment/Plan:   1. Pure hypercholesterolemia We will check labs today. [name] will continue working on decreasing cholesterol in her diet.   - Lipid Panel With LDL/HDL Ratio - TSH  2. Vitamin D deficiency We will check labs today. [name] will continue OTC Vitamin D 1,000 IU daily for now.   - VITAMIN D 25 Hydroxy (Vit-D Deficiency, Fractures)  3. Essential hypertension Roniyah will continue Hyzaar 100-12.5 mg, and we will refill for 1  month.   - losartan-hydrochlorothiazide (HYZAAR) 100-12.5 MG tablet; Take 1 tablet by mouth daily.  Dispense: 30 tablet; Refill: 0  4. Osteoarthritis of right knee, unspecified osteoarthritis type [name] will continue Mobic, and we will refill for 1 month.  - meloxicam (MOBIC) 15 MG tablet; TAKE 1 TABLET(15 MG) BY MOUTH AT BEDTIME  Dispense: 30 tablet; Refill: 0  5. Prediabetes We will check labs today, and we will refill metformin for 1 month.   - Vitamin B12 - CMP14+EGFR - Insulin, random - Hemoglobin A1c - metFORMIN (GLUCOPHAGE) 500 MG tablet; Take 1 tablet (500 mg total) by mouth 2 (two) times daily with a meal.  Dispense: 60 tablet; Refill: 0  6. Emotional Eating Behavior Cheryl [name] will continue her medications, and we will refill Wellbutrin SR and Effexor for 1 month.   - buPROPion (WELLBUTRIN SR) 200 MG 12 hr tablet; Take 1 tablet (200 mg total) by mouth daily.  Dispense: 30 tablet; Refill: 0 - venlafaxine (EFFEXOR) 75 MG tablet; Take 1 tablet (75 mg total) by mouth daily.  Dispense: 30 tablet; Refill: 0  7. BMI 38.0-38.9,adult  8. Obesity, Beginning BMI 45.35 [name] is currently in the action stage of change. As such, her goal is to continue with weight loss efforts. She has agreed to the Category 2 Plan.   Exercise goals: As is.   Behavioral modification strategies: increasing vegetables and meal planning and cooking strategies.  Cheryl [name] has agreed to follow-up with our clinic in 4 weeks. She was informed of  the importance of frequent follow-up visits to maximize her success with intensive lifestyle modifications for her multiple health conditions.   Cheryl [name] was informed we would discuss her lab results at her next visit unless there is a critical issue that needs to be addressed sooner. Cheryl [name] agreed to keep her next visit at the agreed upon time to discuss these results.  Objective:   Blood pressure 129/83, pulse 68, height 5\' 6"  (1.676 m), weight 238 lb (108 kg), SpO2 96  %. Body mass index is 38.41 kg/m.  Lab Results  Component Value Date   CREATININE 0.95 11/16/2022   BUN 29 (H) 11/16/2022   NA 140 11/16/2022   K 4.5 11/16/2022   CL 101 11/16/2022   CO2 23 11/16/2022   Lab Results  Component Value Date   ALT 17 11/16/2022   AST 18 11/16/2022   ALKPHOS 97 11/16/2022   BILITOT 0.4 11/16/2022   Lab Results  Component Value Date   HGBA1C 5.7 (H) 11/16/2022   HGBA1C 5.6 04/13/2022   HGBA1C 5.4 06/29/2021   HGBA1C 5.2 10/14/2020   HGBA1C 5.2 06/10/2020   Lab Results  Component Value Date   INSULIN 7.0 11/16/2022   INSULIN 4.4 04/13/2022   INSULIN 7.0 06/29/2021   INSULIN 5.5 10/14/2020   INSULIN 5.4 06/10/2020   Lab Results  Component Value Date   TSH 3.680 11/16/2022   Lab Results  Component Value Date   CHOL 241 (H) 11/16/2022   HDL 82 11/16/2022   LDLCALC 148 (H) 11/16/2022   TRIG 64 11/16/2022   CHOLHDL 3.1 09/27/2017   Lab Results  Component Value Date   VD25OH 50.4 11/16/2022   VD25OH 38.1 04/13/2022   VD25OH 48.0 06/29/2021   Lab Results  Component Value Date   WBC 4.7 04/13/2022   HGB 14.2 04/13/2022   HCT 43.6 04/13/2022   MCV 88 04/13/2022   PLT 237 04/13/2022   No results found for: "IRON", "TIBC", "FERRITIN"  Attestation Statements:   Reviewed by clinician on day of visit: allergies, medications, problem list, medical history, surgical history, family history, social history, and previous encounter notes.   I, Burt Knack, am acting as transcriptionist for Quillian Quince, MD.  I have reviewed the above documentation for accuracy and completeness, and I agree with the above. -  Quillian Quince, MD

## 2022-12-20 ENCOUNTER — Ambulatory Visit (INDEPENDENT_AMBULATORY_CARE_PROVIDER_SITE_OTHER): Payer: Medicare HMO | Admitting: Family Medicine

## 2022-12-28 ENCOUNTER — Ambulatory Visit (INDEPENDENT_AMBULATORY_CARE_PROVIDER_SITE_OTHER): Payer: Medicare HMO | Admitting: Family Medicine

## 2022-12-28 ENCOUNTER — Other Ambulatory Visit (INDEPENDENT_AMBULATORY_CARE_PROVIDER_SITE_OTHER): Payer: Self-pay | Admitting: Family Medicine

## 2022-12-28 ENCOUNTER — Encounter (INDEPENDENT_AMBULATORY_CARE_PROVIDER_SITE_OTHER): Payer: Self-pay | Admitting: Family Medicine

## 2022-12-28 VITALS — BP 124/81 | HR 79 | Temp 98.2°F | Ht 66.0 in | Wt 238.0 lb

## 2022-12-28 DIAGNOSIS — F3289 Other specified depressive episodes: Secondary | ICD-10-CM

## 2022-12-28 DIAGNOSIS — M1711 Unilateral primary osteoarthritis, right knee: Secondary | ICD-10-CM | POA: Diagnosis not present

## 2022-12-28 DIAGNOSIS — I10 Essential (primary) hypertension: Secondary | ICD-10-CM | POA: Diagnosis not present

## 2022-12-28 DIAGNOSIS — Z6838 Body mass index (BMI) 38.0-38.9, adult: Secondary | ICD-10-CM

## 2022-12-28 DIAGNOSIS — E669 Obesity, unspecified: Secondary | ICD-10-CM | POA: Diagnosis not present

## 2022-12-28 MED ORDER — LOSARTAN POTASSIUM-HCTZ 100-12.5 MG PO TABS: 1.0000 | ORAL_TABLET | Freq: Every day | ORAL | 0 refills | Status: DC

## 2022-12-28 MED ORDER — BUPROPION HCL ER (SR) 200 MG PO TB12: 200.0000 mg | ORAL_TABLET | Freq: Every day | ORAL | 0 refills | Status: DC

## 2022-12-28 MED ORDER — MELOXICAM 15 MG PO TABS
ORAL_TABLET | ORAL | 0 refills | Status: DC
Start: 2022-12-28 — End: 2023-01-30

## 2022-12-28 MED ORDER — VENLAFAXINE HCL 75 MG PO TABS: 75.0000 mg | ORAL_TABLET | Freq: Every day | ORAL | 0 refills | Status: DC

## 2023-01-16 NOTE — Progress Notes (Signed)
Chief Complaint:   OBESITY Cheryl [name] is here to discuss her progress with her obesity treatment plan along with follow-up of her obesity related diagnoses. Cheryl [name] is on the Category 2 Plan and states she is following her eating plan approximately 25% of the time. Cheryl [name] states she is doing 0 minutes 0 times per week.  Today's visit was #: 75 Starting weight: 281 lbs Starting date: 02/06/2018 Today's weight: 238 lbs Today's date: 12/28/2022 Total lbs lost to date: 43 Total lbs lost since last in-office visit: 0  Interim History: Patient has done well with maintaining her weight loss.  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  Subjective:   1. Essential hypertension Patient's blood pressure is well-controlled on Hyzaar, and she denies chest pain.  She is working on decreasing sodium and working on weight loss.  2. Osteoarthritis of right knee, unspecified osteoarthritis type Patient is doing well on Mobic.  It is helping decrease her pain but not completely.  3. Emotional Eating Behavior Patient is doing well with minimizing emotional eating behavior even with increased stress with retiring.  No medication side effects were noted.  Assessment/Plan:   1. Essential hypertension Patient will continue Hyzaar, and we will refill for 1 month.  - losartan-hydrochlorothiazide (HYZAAR) 100-12.5 MG tablet; Take 1 tablet by mouth daily.  Dispense: 30 tablet; Refill: 0  2. Osteoarthritis of right knee, unspecified osteoarthritis type Patient will continue Mobic, and we will refill for 1 month.  She is to make sure to take this with food.  - meloxicam (MOBIC) 15 MG tablet; TAKE 1 TABLET(15 MG) BY MOUTH AT BEDTIME  Dispense: 30 tablet; Refill: 0  3. Emotional Eating Behavior Patient will continue her medications, and we will refill Wellbutrin SR and Effexor for 1 month.  - buPROPion (WELLBUTRIN SR) 200 MG 12 hr tablet; Take 1 tablet (200 mg total) by mouth daily.  Dispense: 30  tablet; Refill: 0 - venlafaxine (EFFEXOR) 75 MG tablet; Take 1 tablet (75 mg total) by mouth daily.  Dispense: 30 tablet; Refill: 0  4. BMI 38.0-38.9,adult  5. Obesity, Beginning BMI 45.35 Cheryl [name] is currently in the action stage of change. As such, her goal is to maintain weight for now. She has agreed to the Category 2 Plan.   Patient's goal is to maintain her weight while she is getting ready to retire.  Behavioral modification strategies: meal planning and cooking strategies and emotional eating strategies.  Cheryl [name] has agreed to follow-up with our clinic in 4 weeks. She was informed of the importance of frequent follow-up visits to maximize her success with intensive lifestyle modifications for her multiple health conditions.   Objective:   Blood pressure 124/81, pulse 79, temperature 98.2 F (36.8 C), height 5\' 6"  (1.676 m), weight 238 lb (108 kg), SpO2 99 %. Body mass index is 38.41 kg/m.  Lab Results  Component Value Date   CREATININE 0.95 11/16/2022   BUN 29 (H) 11/16/2022   NA 140 11/16/2022   K 4.5 11/16/2022   CL 101 11/16/2022   CO2 23 11/16/2022   Lab Results  Component Value Date   ALT 17 11/16/2022   AST 18 11/16/2022   ALKPHOS 97 11/16/2022   BILITOT 0.4 11/16/2022   Lab Results  Component Value Date   HGBA1C 5.7 (H) 11/16/2022   HGBA1C 5.6 04/13/2022   HGBA1C 5.4 06/29/2021   HGBA1C 5.2 10/14/2020   HGBA1C 5.2 06/10/2020   Lab Results  Component Value Date  INSULIN 7.0 11/16/2022   INSULIN 4.4 04/13/2022   INSULIN 7.0 06/29/2021   INSULIN 5.5 10/14/2020   INSULIN 5.4 06/10/2020   Lab Results  Component Value Date   TSH 3.680 11/16/2022   Lab Results  Component Value Date   CHOL 241 (H) 11/16/2022   HDL 82 11/16/2022   LDLCALC 148 (H) 11/16/2022   TRIG 64 11/16/2022   CHOLHDL 3.1 09/27/2017   Lab Results  Component Value Date   VD25OH 50.4 11/16/2022   VD25OH 38.1 04/13/2022   VD25OH 48.0 06/29/2021   Lab Results  Component Value  Date   WBC 4.7 04/13/2022   HGB 14.2 04/13/2022   HCT 43.6 04/13/2022   MCV 88 04/13/2022   PLT 237 04/13/2022   No results found for: "IRON", "TIBC", "FERRITIN"  Attestation Statements:   Reviewed by clinician on day of visit: allergies, medications, problem list, medical history, surgical history, family history, social history, and previous encounter notes.   I, Burt Knack, am acting as transcriptionist for Quillian Quince, MD.  I have reviewed the above documentation for accuracy and completeness, and I agree with the above. -  Quillian Quince, MD

## 2023-01-18 ENCOUNTER — Other Ambulatory Visit (INDEPENDENT_AMBULATORY_CARE_PROVIDER_SITE_OTHER): Payer: Self-pay | Admitting: Family Medicine

## 2023-01-18 DIAGNOSIS — R7303 Prediabetes: Secondary | ICD-10-CM

## 2023-01-30 ENCOUNTER — Ambulatory Visit (INDEPENDENT_AMBULATORY_CARE_PROVIDER_SITE_OTHER): Payer: Medicare HMO | Admitting: Family Medicine

## 2023-01-30 ENCOUNTER — Encounter (INDEPENDENT_AMBULATORY_CARE_PROVIDER_SITE_OTHER): Payer: Self-pay | Admitting: Family Medicine

## 2023-01-30 VITALS — BP 133/84 | HR 81 | Temp 97.7°F | Ht 66.0 in | Wt 240.0 lb

## 2023-01-30 DIAGNOSIS — Z6838 Body mass index (BMI) 38.0-38.9, adult: Secondary | ICD-10-CM | POA: Diagnosis not present

## 2023-01-30 DIAGNOSIS — I10 Essential (primary) hypertension: Secondary | ICD-10-CM | POA: Diagnosis not present

## 2023-01-30 DIAGNOSIS — E669 Obesity, unspecified: Secondary | ICD-10-CM

## 2023-01-30 DIAGNOSIS — M1711 Unilateral primary osteoarthritis, right knee: Secondary | ICD-10-CM

## 2023-01-30 DIAGNOSIS — F3289 Other specified depressive episodes: Secondary | ICD-10-CM | POA: Diagnosis not present

## 2023-01-30 MED ORDER — VENLAFAXINE HCL 75 MG PO TABS: 75.0000 mg | ORAL_TABLET | Freq: Every day | ORAL | 0 refills | Status: DC

## 2023-01-30 MED ORDER — MELOXICAM 15 MG PO TABS
ORAL_TABLET | ORAL | 0 refills | Status: DC
Start: 2023-01-30 — End: 2023-02-28

## 2023-01-30 MED ORDER — LOSARTAN POTASSIUM-HCTZ 100-12.5 MG PO TABS: 1.0000 | ORAL_TABLET | Freq: Every day | ORAL | 0 refills | Status: DC

## 2023-01-30 MED ORDER — BUPROPION HCL ER (SR) 200 MG PO TB12: 200.0000 mg | ORAL_TABLET | Freq: Every day | ORAL | 0 refills | Status: DC

## 2023-01-30 NOTE — Progress Notes (Unsigned)
Chief Complaint:   OBESITY Cheryl [name] is here to discuss her progress with her obesity treatment plan along with follow-up of her obesity related diagnoses. Cheryl [name] is on the Category 2 Plan and states she is following her eating plan approximately 25% of the time. Cheryl [name] states she has been walking more.    Today's visit was #: 76 Starting weight: 281 lbs Starting date: 02/06/2018 Today's weight: 281 lbs Today's date: 01/30/2023 Total lbs lost to date: 41 Total lbs lost since last in-office visit: 0  Interim History: Patient is working on getting ready to retire.  Her stress has been high due to this.  Subjective:   1. Essential hypertension Patient's blood pressure is stable and controlled on Hyzaar, with no side effects noted.  2. Osteoarthritis of right knee, unspecified osteoarthritis type Patient is stable on Mobic with no side effects noted.  She continues to work on her weight loss.  3. Emotional Eating Behavior Patient has increased stress at work as she is getting ready to retire.  Assessment/Plan:   1. Essential hypertension Patient will continue Hyzaar, and we will refill for 1 month.  - losartan-hydrochlorothiazide (HYZAAR) 100-12.5 MG tablet; Take 1 tablet by mouth daily.  Dispense: 30 tablet; Refill: 0  2. Osteoarthritis of right knee, unspecified osteoarthritis type Patient will continue Mobic, and we will refill for 1 month.  - meloxicam (MOBIC) 15 MG tablet; TAKE 1 TABLET(15 MG) BY MOUTH AT BEDTIME  Dispense: 30 tablet; Refill: 0  3. Emotional Eating Behavior Patient will continue her medications, and we will refill Wellbutrin SR and Effexor for 1 month.  - buPROPion (WELLBUTRIN SR) 200 MG 12 hr tablet; Take 1 tablet (200 mg total) by mouth daily.  Dispense: 30 tablet; Refill: 0 - venlafaxine (EFFEXOR) 75 MG tablet; Take 1 tablet (75 mg total) by mouth daily.  Dispense: 30 tablet; Refill: 0  4. BMI 38.0-38.9,adult  5. Obesity, Beginning BMI  45.35 Cheryl [name] is currently in the action stage of change. As such, her goal is to continue with weight loss efforts. She has agreed to the Category 2 Plan.   Exercise goals: As is.   Behavioral modification strategies: increasing lean protein intake.  Cheryl [name] has agreed to follow-up with our clinic in 4 weeks. She was informed of the importance of frequent follow-up visits to maximize her success with intensive lifestyle modifications for her multiple health conditions.   Objective:   Blood pressure 133/84, pulse 81, temperature 97.7 F (36.5 C), height 5\' 6"  (1.676 m), weight 240 lb (108.9 kg), SpO2 97 %. Body mass index is 38.74 kg/m.  Lab Results  Component Value Date   CREATININE 0.95 11/16/2022   BUN 29 (H) 11/16/2022   NA 140 11/16/2022   K 4.5 11/16/2022   CL 101 11/16/2022   CO2 23 11/16/2022   Lab Results  Component Value Date   ALT 17 11/16/2022   AST 18 11/16/2022   ALKPHOS 97 11/16/2022   BILITOT 0.4 11/16/2022   Lab Results  Component Value Date   HGBA1C 5.7 (H) 11/16/2022   HGBA1C 5.6 04/13/2022   HGBA1C 5.4 06/29/2021   HGBA1C 5.2 10/14/2020   HGBA1C 5.2 06/10/2020   Lab Results  Component Value Date   INSULIN 7.0 11/16/2022   INSULIN 4.4 04/13/2022   INSULIN 7.0 06/29/2021   INSULIN 5.5 10/14/2020   INSULIN 5.4 06/10/2020   Lab Results  Component Value Date   TSH 3.680 11/16/2022   Lab Results  Component Value Date  CHOL 241 (H) 11/16/2022   HDL 82 11/16/2022   LDLCALC 148 (H) 11/16/2022   TRIG 64 11/16/2022   CHOLHDL 3.1 09/27/2017   Lab Results  Component Value Date   VD25OH 50.4 11/16/2022   VD25OH 38.1 04/13/2022   VD25OH 48.0 06/29/2021   Lab Results  Component Value Date   WBC 4.7 04/13/2022   HGB 14.2 04/13/2022   HCT 43.6 04/13/2022   MCV 88 04/13/2022   PLT 237 04/13/2022   No results found for: "IRON", "TIBC", "FERRITIN"  Attestation Statements:   Reviewed by clinician on day of visit: allergies, medications,  problem list, medical history, surgical history, family history, social history, and previous encounter notes.   I, Burt Knack, am acting as transcriptionist for Quillian Quince, MD.  I have reviewed the above documentation for accuracy and completeness, and I agree with the above. -  Quillian Quince, MD

## 2023-02-27 ENCOUNTER — Ambulatory Visit (INDEPENDENT_AMBULATORY_CARE_PROVIDER_SITE_OTHER): Payer: Medicare HMO | Admitting: Family Medicine

## 2023-02-28 ENCOUNTER — Ambulatory Visit (INDEPENDENT_AMBULATORY_CARE_PROVIDER_SITE_OTHER): Payer: Medicare HMO | Admitting: Family Medicine

## 2023-02-28 ENCOUNTER — Encounter (INDEPENDENT_AMBULATORY_CARE_PROVIDER_SITE_OTHER): Payer: Self-pay | Admitting: Family Medicine

## 2023-02-28 VITALS — BP 122/78 | HR 71 | Temp 97.5°F | Ht 66.0 in | Wt 237.0 lb

## 2023-02-28 DIAGNOSIS — R7303 Prediabetes: Secondary | ICD-10-CM | POA: Diagnosis not present

## 2023-02-28 DIAGNOSIS — I10 Essential (primary) hypertension: Secondary | ICD-10-CM | POA: Diagnosis not present

## 2023-02-28 DIAGNOSIS — E669 Obesity, unspecified: Secondary | ICD-10-CM | POA: Diagnosis not present

## 2023-02-28 DIAGNOSIS — Z6838 Body mass index (BMI) 38.0-38.9, adult: Secondary | ICD-10-CM

## 2023-02-28 DIAGNOSIS — F3289 Other specified depressive episodes: Secondary | ICD-10-CM | POA: Diagnosis not present

## 2023-02-28 DIAGNOSIS — M1711 Unilateral primary osteoarthritis, right knee: Secondary | ICD-10-CM

## 2023-02-28 MED ORDER — METFORMIN HCL 500 MG PO TABS
500.0000 mg | ORAL_TABLET | Freq: Two times a day (BID) | ORAL | 0 refills | Status: DC
Start: 2023-02-28 — End: 2023-06-05

## 2023-02-28 MED ORDER — VENLAFAXINE HCL 75 MG PO TABS
75.0000 mg | ORAL_TABLET | Freq: Every day | ORAL | 0 refills | Status: DC
Start: 2023-02-28 — End: 2023-04-04

## 2023-02-28 MED ORDER — BUPROPION HCL ER (SR) 200 MG PO TB12: 200.0000 mg | ORAL_TABLET | Freq: Every day | ORAL | 0 refills | Status: DC

## 2023-02-28 MED ORDER — LOSARTAN POTASSIUM-HCTZ 100-12.5 MG PO TABS: 1.0000 | ORAL_TABLET | Freq: Every day | ORAL | 0 refills | Status: DC

## 2023-02-28 MED ORDER — MELOXICAM 15 MG PO TABS
ORAL_TABLET | ORAL | 0 refills | Status: DC
Start: 2023-02-28 — End: 2023-05-25

## 2023-02-28 NOTE — Progress Notes (Unsigned)
Chief Complaint:   OBESITY Cheryl [name] is here to discuss her progress with her obesity treatment plan along with follow-up of her obesity related diagnoses. Cheryl [name] is on the Category 2 Plan and states she is following her eating plan approximately 33% of the time. Cheryl [name] states she is active in the water for 6-8 hours.  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  She is enjoying her retirement thus far.  She has cleaned a bit around the house and gone to the pool.  She is going to [employer] this upcoming weekend.  She is anticipating walking more while away, ordering double protein and be mindful of total carb intake.   Subjective:   1. Essential hypertension Patient's blood pressure is well-controlled today.  She denies chest pain, chest pressure, or headache.  2. Prediabetes Patient's last A1c was 5.7 and insulin 7.0.  She is on metformin with no GI side effects.  3. Osteoarthritis of right knee, unspecified osteoarthritis type Patient is on meloxicam but not daily.  Her symptoms are well-managed.  4. Emotional Eating Behavior Patient is on Wellbutrin and Effexor, and she denies suicidal or homicidal ideations.  Assessment/Plan:   1. Essential hypertension Patient will continue Hyzaar 100-12.5 mg once daily, and we will refill for 1 month.  - losartan-hydrochlorothiazide (HYZAAR) 100-12.5 MG tablet; Take 1 tablet by mouth daily.  Dispense: 30 tablet; Refill: 0  2. Prediabetes Patient will continue metformin 500 mg twice daily, and we will refill for 1 month.  - metFORMIN (GLUCOPHAGE) 500 MG tablet; Take 1 tablet (500 mg total) by mouth 2 (two) times daily with a meal.  Dispense: 60 tablet; Refill: 0  3. Osteoarthritis of right knee,  unspecified osteoarthritis type We will refill meloxicam 15 mg once daily for 1 month with no additional refills.  - meloxicam (MOBIC) 15 MG tablet; TAKE 1 TABLET(15 MG) BY MOUTH AT BEDTIME  Dispense: 30 tablet; Refill: 0  4. Emotional Eating Behavior Patient will continue Wellbutrin SR and Effexor, and we will refill for 1 month.  - buPROPion (WELLBUTRIN SR) 200 MG 12 hr tablet; Take 1 tablet (200 mg total) by mouth daily.  Dispense: 30 tablet; Refill: 0 - venlafaxine (EFFEXOR) 75 MG tablet; Take 1 tablet (75 mg total) by mouth daily.  Dispense: 30 tablet; Refill: 0  5. BMI 38.0-38.9,adult  6. Obesity, Beginning BMI 45.35 Cheryl [name] is currently in the action stage of change. As such, her goal is to continue with weight loss efforts. She has agreed to the Category 2 Plan.   Exercise goals: As is.   Behavioral modification strategies: increasing lean protein intake, meal planning and cooking strategies, keeping healthy foods in the home, and planning for success.  Cheryl [name] has agreed to follow-up with our clinic in 4 weeks. She was informed of the importance of frequent follow-up visits to maximize her success with intensive lifestyle modifications for her multiple health conditions.   Objective:   Blood pressure 122/78, pulse 71, temperature (!) 97.5 F (36.4 C), height 5\' 6"  (1.676 m), weight 237 lb (107.5 kg), SpO2 97%. Body mass index is 38.25 kg/m.  General: Cooperative, alert, well developed, in no acute distress. HEENT: Conjunctivae and lids unremarkable. Cardiovascular: Regular rhythm.  Lungs: Normal work of breathing. Neurologic:  No focal deficits.   Lab Results  Component Value Date   CREATININE 0.95 11/16/2022   BUN 29 (H) 11/16/2022   NA 140 11/16/2022   K 4.5 11/16/2022   CL 101 11/16/2022   CO2 23 11/16/2022   Lab Results  Component Value Date   ALT 17 11/16/2022   AST 18 11/16/2022   ALKPHOS 97 11/16/2022   BILITOT 0.4 11/16/2022   Lab Results  Component  Value Date   HGBA1C 5.7 (H) 11/16/2022   HGBA1C 5.6 04/13/2022   HGBA1C 5.4 06/29/2021   HGBA1C 5.2 10/14/2020   HGBA1C 5.2 06/10/2020   Lab Results  Component Value Date   INSULIN 7.0 11/16/2022   INSULIN 4.4 04/13/2022   INSULIN 7.0 06/29/2021   INSULIN 5.5 10/14/2020   INSULIN 5.4 06/10/2020   Lab Results  Component Value Date   TSH 3.680 11/16/2022   Lab Results  Component Value Date   CHOL 241 (H) 11/16/2022   HDL 82 11/16/2022   LDLCALC 148 (H) 11/16/2022   TRIG 64 11/16/2022   CHOLHDL 3.1 09/27/2017   Lab Results  Component Value Date   VD25OH 50.4 11/16/2022   VD25OH 38.1 04/13/2022   VD25OH 48.0 06/29/2021   Lab Results  Component Value Date   WBC 4.7 04/13/2022   HGB 14.2 04/13/2022   HCT 43.6 04/13/2022   MCV 88 04/13/2022   PLT 237 04/13/2022   No results found for: "IRON", "TIBC", "FERRITIN"  Attestation Statements:   Reviewed by clinician on day of visit: allergies, medications, problem list, medical history, surgical history, family history, social history, and previous encounter notes.   I, Burt Knack, am acting as transcriptionist for Reuben Likes, MD.  I have reviewed the above documentation for accuracy and completeness, and I agree with the above. - Reuben Likes, MD

## 2023-03-20 DIAGNOSIS — H18413 Arcus senilis, bilateral: Secondary | ICD-10-CM | POA: Diagnosis not present

## 2023-03-20 DIAGNOSIS — H25013 Cortical age-related cataract, bilateral: Secondary | ICD-10-CM | POA: Diagnosis not present

## 2023-03-20 DIAGNOSIS — H2511 Age-related nuclear cataract, right eye: Secondary | ICD-10-CM | POA: Diagnosis not present

## 2023-03-20 DIAGNOSIS — H25043 Posterior subcapsular polar age-related cataract, bilateral: Secondary | ICD-10-CM | POA: Diagnosis not present

## 2023-03-20 DIAGNOSIS — H2513 Age-related nuclear cataract, bilateral: Secondary | ICD-10-CM | POA: Diagnosis not present

## 2023-04-04 ENCOUNTER — Ambulatory Visit (INDEPENDENT_AMBULATORY_CARE_PROVIDER_SITE_OTHER): Payer: Medicare HMO | Admitting: Family Medicine

## 2023-04-04 ENCOUNTER — Encounter (INDEPENDENT_AMBULATORY_CARE_PROVIDER_SITE_OTHER): Payer: Self-pay | Admitting: Family Medicine

## 2023-04-04 VITALS — BP 142/84 | HR 74 | Temp 97.8°F | Ht 66.0 in | Wt 241.0 lb

## 2023-04-04 DIAGNOSIS — I10 Essential (primary) hypertension: Secondary | ICD-10-CM

## 2023-04-04 DIAGNOSIS — G4709 Other insomnia: Secondary | ICD-10-CM | POA: Insufficient documentation

## 2023-04-04 DIAGNOSIS — E669 Obesity, unspecified: Secondary | ICD-10-CM | POA: Diagnosis not present

## 2023-04-04 DIAGNOSIS — Z6839 Body mass index (BMI) 39.0-39.9, adult: Secondary | ICD-10-CM

## 2023-04-04 DIAGNOSIS — Z6838 Body mass index (BMI) 38.0-38.9, adult: Secondary | ICD-10-CM

## 2023-04-04 DIAGNOSIS — F3289 Other specified depressive episodes: Secondary | ICD-10-CM

## 2023-04-04 MED ORDER — BUPROPION HCL ER (SR) 200 MG PO TB12
200.0000 mg | ORAL_TABLET | Freq: Every day | ORAL | 0 refills | Status: DC
Start: 2023-04-04 — End: 2023-05-08

## 2023-04-04 MED ORDER — VENLAFAXINE HCL 75 MG PO TABS
75.0000 mg | ORAL_TABLET | Freq: Every day | ORAL | 0 refills | Status: DC
Start: 2023-04-04 — End: 2023-05-08

## 2023-04-04 MED ORDER — LOSARTAN POTASSIUM-HCTZ 100-12.5 MG PO TABS
1.0000 | ORAL_TABLET | Freq: Every day | ORAL | 0 refills | Status: DC
Start: 2023-04-04 — End: 2023-05-08

## 2023-04-04 NOTE — Progress Notes (Signed)
.[facility]  Office: [phone]  /  Fax: [phone]  WEIGHT SUMMARY AND BIOMETRICS  Anthropometric Measurements Height: 5\' 6"  (1.676 m) Weight: 241 lb (109.3 kg) BMI (Calculated): 38.92 Weight at Last Visit: 237 lb Weight Lost Since Last Visit: 0 Weight Gained Since Last Visit: 4 lb Total Weight Loss (lbs): 40 lb (18.1 kg)   Body Composition  Body Fat %: 46.4 % Fat Mass (lbs): 112.2 lbs Muscle Mass (lbs): 122.8 lbs Total Body Water (lbs): 93 lbs Visceral Fat Rating : 15   Other Clinical Data Fasting: No Labs: No Today's Visit #: 76    Chief Complaint: OBESITY   Discussed the use of AI scribe software for clinical note transcription with the patient, who gave verbal consent to proceed.  History of Present Illness   The patient is a 67 year old individual with a history of obesity, hypertension, and depression, who presents today with concerns related to weight gain and emotional eating. Over the past month, she has gained four pounds and has been struggling to consistently maintain a food journal, achieving adherence approximately 25% of the time. She has been attempting to increase physical activity, engaging in walking and swimming for about 45 minutes twice a week.  The patient's blood pressure was noted to be slightly elevated today, which she attributes to a stressful morning due to her daughter's health issues. Her daughter was recently diagnosed with a UTI that was initially inadequately treated, leading to a worsening of her condition. This situation has added to the patient's stress levels.  Regarding her mood, the patient admits to comfort eating, particularly now that she is home more often. She has been trying to consume healthier foods by shopping at [facility] for vegetables, dairy, and meat, and is working through [employer] that she acknowledges may not be the healthiest choices. She denies any issues with her current medications, Effexor and Wellbutrin.  The  patient reports intermittent sleep, with periods of wakefulness during the night. She estimates a total of six hours of sleep per night and generally feels refreshed upon waking. She is also dealing with family stressors, as her father is in hospice care, and she is assisting her mother with end-of-life planning.  The patient is retired and is adjusting to the new routine, which has been a source of some anxiety. She is scheduled for cataract surgery in October, which has been a source of ongoing vision issues for the past year. She is also dealing with increased thirst and has been making a conscious effort to stay hydrated.          PHYSICAL EXAM:  Blood pressure (!) 142/84, pulse 74, temperature 97.8 F (36.6 C), height 5\' 6"  (1.676 m), weight 241 lb (109.3 kg), SpO2 99%. Body mass index is 38.9 kg/m.  DIAGNOSTIC DATA REVIEWED:  BMET    Component Value Date/Time   NA 140 11/16/2022 0926   K 4.5 11/16/2022 0926   CL 101 11/16/2022 0926   CO2 23 11/16/2022 0926   GLUCOSE 88 11/16/2022 0926   GLUCOSE 93 08/28/2018 1333   BUN 29 (H) 11/16/2022 0926   CREATININE 0.95 11/16/2022 0926   CREATININE 0.81 06/08/2016 1353   CALCIUM 9.3 11/16/2022 0926   GFRNONAA 92 06/10/2020 0953   GFRAA 106 06/10/2020 0953   Lab Results  Component Value Date   HGBA1C 5.7 (H) 11/16/2022   HGBA1C 5.8 (H) 02/06/2018   Lab Results  Component Value Date   INSULIN 7.0 11/16/2022   INSULIN 8.7 02/06/2018  Lab Results  Component Value Date   TSH 3.680 11/16/2022   CBC    Component Value Date/Time   WBC 4.7 04/13/2022 1110   WBC 5.0 06/08/2016 1353   RBC 4.98 04/13/2022 1110   RBC 4.94 06/08/2016 1353   HGB 14.2 04/13/2022 1110   HGB 14.3 04/02/2014 0937   HCT 43.6 04/13/2022 1110   PLT 237 04/13/2022 1110   MCV 88 04/13/2022 1110   MCH 28.5 04/13/2022 1110   MCH 27.5 06/08/2016 1353   MCHC 32.6 04/13/2022 1110   MCHC 31.2 (L) 06/08/2016 1353   RDW 13.4 04/13/2022 1110   Iron  Studies No results found for: "IRON", "TIBC", "FERRITIN", "IRONPCTSAT" Lipid Panel     Component Value Date/Time   CHOL 241 (H) 11/16/2022 0926   TRIG 64 11/16/2022 0926   HDL 82 11/16/2022 0926   CHOLHDL 3.1 09/27/2017 1404   CHOLHDL 3.4 06/08/2016 1353   VLDL 19 06/08/2016 1353   LDLCALC 148 (H) 11/16/2022 0926   Hepatic Function Panel     Component Value Date/Time   PROT 6.8 11/16/2022 0926   ALBUMIN 4.4 11/16/2022 0926   AST 18 11/16/2022 0926   ALT 17 11/16/2022 0926   ALKPHOS 97 11/16/2022 0926   BILITOT 0.4 11/16/2022 0926      Component Value Date/Time   TSH 3.680 11/16/2022 0926   Nutritional Lab Results  Component Value Date   VD25OH 50.4 11/16/2022   VD25OH 38.1 04/13/2022   VD25OH 48.0 06/29/2021     Assessment and Plan    Obesity Recent weight gain of 4 pounds. Struggling with maintaining a food journal and emotional eating. Attempting to increase physical activity with walking and swimming twice weekly. -Continue efforts to maintain food journal 1200-1300 calories and 75 grams or more of protein -Continue physical activity regimen. -Plan to perform indirect calorimeter test at next visit in 4 weeks.  Hypertension Elevated blood pressure at today's visit, possibly due to acute stress. No reported issues with current medication. -Refill Losartan Hydrochlorothiazide. -Continue current medication regimen.  Depression No reported issues with current medication. Emotional eating behaviors noted. -Refill Effexor and Bupropion. -Continue current medication regimen.  Insomnia Reports difficulty maintaining sleep with periods of wakefulness. Total sleep duration approximately 6 hours. -Continue monitoring sleep patterns and report if worsens.  General Health Maintenance -Plan to perform labs and IC at next visit in 4 weeks.       She was informed of the importance of frequent follow up visits to maximize her success with intensive lifestyle  modifications for her multiple health conditions.    Quillian Quince, MD

## 2023-05-08 ENCOUNTER — Ambulatory Visit (INDEPENDENT_AMBULATORY_CARE_PROVIDER_SITE_OTHER): Payer: Medicare HMO | Admitting: Family Medicine

## 2023-05-08 ENCOUNTER — Encounter (INDEPENDENT_AMBULATORY_CARE_PROVIDER_SITE_OTHER): Payer: Self-pay | Admitting: Family Medicine

## 2023-05-08 VITALS — BP 151/77 | HR 47 | Temp 97.6°F | Ht 66.0 in | Wt 234.0 lb

## 2023-05-08 DIAGNOSIS — R7303 Prediabetes: Secondary | ICD-10-CM | POA: Diagnosis not present

## 2023-05-08 DIAGNOSIS — R0602 Shortness of breath: Secondary | ICD-10-CM | POA: Diagnosis not present

## 2023-05-08 DIAGNOSIS — E559 Vitamin D deficiency, unspecified: Secondary | ICD-10-CM | POA: Diagnosis not present

## 2023-05-08 DIAGNOSIS — Z6837 Body mass index (BMI) 37.0-37.9, adult: Secondary | ICD-10-CM | POA: Diagnosis not present

## 2023-05-08 DIAGNOSIS — F3289 Other specified depressive episodes: Secondary | ICD-10-CM

## 2023-05-08 DIAGNOSIS — F5089 Other specified eating disorder: Secondary | ICD-10-CM

## 2023-05-08 DIAGNOSIS — I10 Essential (primary) hypertension: Secondary | ICD-10-CM | POA: Diagnosis not present

## 2023-05-08 DIAGNOSIS — E669 Obesity, unspecified: Secondary | ICD-10-CM

## 2023-05-08 MED ORDER — VENLAFAXINE HCL 75 MG PO TABS
75.0000 mg | ORAL_TABLET | Freq: Every day | ORAL | 0 refills | Status: DC
Start: 2023-05-08 — End: 2023-06-05

## 2023-05-08 MED ORDER — BUPROPION HCL ER (SR) 200 MG PO TB12
200.0000 mg | ORAL_TABLET | Freq: Every day | ORAL | 0 refills | Status: DC
Start: 2023-05-08 — End: 2023-06-05

## 2023-05-08 MED ORDER — LOSARTAN POTASSIUM-HCTZ 100-12.5 MG PO TABS
1.0000 | ORAL_TABLET | Freq: Every day | ORAL | 0 refills | Status: DC
Start: 2023-05-08 — End: 2023-06-05

## 2023-05-09 LAB — LIPID PANEL WITH LDL/HDL RATIO
Cholesterol, Total: 240 mg/dL — ABNORMAL HIGH (ref 100–199)
HDL: 76 mg/dL (ref >39)
LDL Chol Calc (NIH): 147 mg/dL — ABNORMAL HIGH (ref 0–99)
LDL/HDL Ratio: 1.9 ratio (ref 0.0–3.2)
Triglycerides: 98 mg/dL (ref 0–149)
VLDL Cholesterol Cal: 17 mg/dL (ref 5–40)

## 2023-05-09 LAB — CMP14+EGFR
ALT: 16 IU/L (ref 0–32)
AST: 17 IU/L (ref 0–40)
Albumin: 4.2 g/dL (ref 3.9–4.9)
Alkaline Phosphatase: 96 IU/L (ref 44–121)
BUN/Creatinine Ratio: 27 (ref 12–28)
BUN: 25 mg/dL (ref 8–27)
Bilirubin Total: 0.4 mg/dL (ref 0.0–1.2)
CO2: 23 mmol/L (ref 20–29)
Calcium: 9.4 mg/dL (ref 8.7–10.3)
Chloride: 102 mmol/L (ref 96–106)
Creatinine, Ser: 0.92 mg/dL (ref 0.57–1.00)
Globulin, Total: 2.7 g/dL (ref 1.5–4.5)
Glucose: 98 mg/dL (ref 70–99)
Potassium: 4.9 mmol/L (ref 3.5–5.2)
Sodium: 140 mmol/L (ref 134–144)
Total Protein: 6.9 g/dL (ref 6.0–8.5)
eGFR: 68 mL/min/{1.73_m2} (ref >59)

## 2023-05-09 LAB — VITAMIN B12: Vitamin B-12: 445 pg/mL (ref 232–1245)

## 2023-05-09 LAB — VITAMIN D 25 HYDROXY (VIT D DEFICIENCY, FRACTURES): Vit D, 25-Hydroxy: 62.9 ng/mL (ref 30.0–100.0)

## 2023-05-09 LAB — HEMOGLOBIN A1C
Est. average glucose Bld gHb Est-mCnc: 105 mg/dL
Hgb A1c MFr Bld: 5.3 % (ref 4.8–5.6)

## 2023-05-09 LAB — INSULIN, RANDOM: INSULIN: 9.3 u[IU]/mL (ref 2.6–24.9)

## 2023-05-10 NOTE — Progress Notes (Signed)
Chief Complaint:   OBESITY Cheryl [name] is here to discuss her progress with her obesity treatment plan along with follow-up of her obesity related diagnoses. Cheryl [name] is on the Category 2 Plan and states she is following her eating plan approximately 30% of the time. Cheryl [name] states she is doing 0 minutes 0 times per week.  Today's visit was #: 79 Starting weight: 281 lbs Starting date: 02/06/2018 Today's weight: 234 lbs Today's date: 05/08/2023 Total lbs lost to date: 47 Total lbs lost since last in-office visit: 7  Interim History: Patient has been cooking at home more.  She is working on journaling some and trying to increase her strengthening exercise.  She has done well with her weight loss this last month.  Subjective:   1. Shortness of breath on exertion Patient is working on her weight loss to help decrease shortness of breath with exertion.  She is due to have her IC repeated today.  2. Prediabetes Patient is due for labs, and she is working on her diet.  3. Vitamin D deficiency Patient is on vitamin D, and she is at high risk of over replacement on prescription vitamin D.  4. Essential hypertension Patient's blood pressure is very elevated today.  She did not take her medications this morning due to fasting.  5. Emotional Eating Behavior Patient is working on decreasing emotional eating behavior.  Assessment/Plan:   1. Shortness of breath on exertion IC was repeated today and RMR has improved and is close to goal.  Patient will work on continued weight loss and start to increase exercise.  We will follow-up at her next visit in 1 month.  2. Prediabetes We will check labs today.  Patient will continue with her diet and exercise, and we will follow-up at her next visit in 1 month.  - CMP14+EGFR - Vitamin B12 - Lipid Panel With LDL/HDL Ratio - Insulin, random - Hemoglobin A1c  3. Vitamin D deficiency We will check labs today, and we will follow-up at patient's next  visit in 1 month.  - VITAMIN D 25 Hydroxy (Vit-D Deficiency, Fractures)  4. Essential hypertension Patient will continue Hyzaar 100-12.5 mg once daily, and we will refill for 1 month.  She is to take her medications today.  - losartan-hydrochlorothiazide (HYZAAR) 100-12.5 MG tablet; Take 1 tablet by mouth daily.  Dispense: 30 tablet; Refill: 0  5. Emotional Eating Behavior Patient will continue her medications, and we will refill Wellbutrin SR 200 mg once daily and Effexor 75 mg once daily for 1 month.  - buPROPion (WELLBUTRIN SR) 200 MG 12 hr tablet; Take 1 tablet (200 mg total) by mouth daily.  Dispense: 30 tablet; Refill: 0 - venlafaxine (EFFEXOR) 75 MG tablet; Take 1 tablet (75 mg total) by mouth daily.  Dispense: 30 tablet; Refill: 0  6. BMI 37.0-37.9, adult  7. Obesity, Beginning BMI 45.35 Cheryl [name] is currently in the action stage of change. As such, her goal is to continue with weight loss efforts. She has agreed to keeping a food journal and adhering to recommended goals of 1200-1300 calories and 75+ grams of protein daily.   Behavioral modification strategies: increasing lean protein intake.  Cheryl [name] has agreed to follow-up with our clinic in 4 weeks. She was informed of the importance of frequent follow-up visits to maximize her success with intensive lifestyle modifications for her multiple health conditions.   Cheryl [name] was informed we would discuss her lab results at her next visit unless there is a critical  issue that needs to be addressed sooner. Cheryl [name] agreed to keep her next visit at the agreed upon time to discuss these results.  Objective:   Blood pressure (!) 151/77, pulse (!) 47, temperature 97.6 F (36.4 C), height 5\' 6"  (1.676 m), weight 234 lb (106.1 kg), SpO2 99%. Body mass index is 37.77 kg/m.  Lab Results  Component Value Date   CREATININE 0.92 05/08/2023   BUN 25 05/08/2023   NA 140 05/08/2023   K 4.9 05/08/2023   CL 102 05/08/2023   CO2 23 05/08/2023    Lab Results  Component Value Date   ALT 16 05/08/2023   AST 17 05/08/2023   ALKPHOS 96 05/08/2023   BILITOT 0.4 05/08/2023   Lab Results  Component Value Date   HGBA1C 5.3 05/08/2023   HGBA1C 5.7 (H) 11/16/2022   HGBA1C 5.6 04/13/2022   HGBA1C 5.4 06/29/2021   HGBA1C 5.2 10/14/2020   Lab Results  Component Value Date   INSULIN 9.3 05/08/2023   INSULIN 7.0 11/16/2022   INSULIN 4.4 04/13/2022   INSULIN 7.0 06/29/2021   INSULIN 5.5 10/14/2020   Lab Results  Component Value Date   TSH 3.680 11/16/2022   Lab Results  Component Value Date   CHOL 240 (H) 05/08/2023   HDL 76 05/08/2023   LDLCALC 147 (H) 05/08/2023   TRIG 98 05/08/2023   CHOLHDL 3.1 09/27/2017   Lab Results  Component Value Date   VD25OH 62.9 05/08/2023   VD25OH 50.4 11/16/2022   VD25OH 38.1 04/13/2022   Lab Results  Component Value Date   WBC 4.7 04/13/2022   HGB 14.2 04/13/2022   HCT 43.6 04/13/2022   MCV 88 04/13/2022   PLT 237 04/13/2022   No results found for: "IRON", "TIBC", "FERRITIN"  Attestation Statements:   Reviewed by clinician on day of visit: allergies, medications, problem list, medical history, surgical history, family history, social history, and previous encounter notes.  I have personally spent 41 minutes total time today in preparation, patient care, and documentation for this visit, including the following: review of clinical lab tests; review of medical tests/procedures/services.   I, Burt Knack, am acting as transcriptionist for Quillian Quince, MD.  I have reviewed the above documentation for accuracy and completeness, and I agree with the above. -  Quillian Quince, MD

## 2023-05-14 DIAGNOSIS — M170 Bilateral primary osteoarthritis of knee: Secondary | ICD-10-CM | POA: Diagnosis not present

## 2023-05-25 ENCOUNTER — Ambulatory Visit: Payer: Medicare HMO | Admitting: Family Medicine

## 2023-05-25 ENCOUNTER — Encounter: Payer: Self-pay | Admitting: Family Medicine

## 2023-05-25 VITALS — BP 124/82 | HR 70 | Temp 98.2°F | Ht 66.0 in | Wt 234.6 lb

## 2023-05-25 DIAGNOSIS — Z23 Encounter for immunization: Secondary | ICD-10-CM | POA: Diagnosis not present

## 2023-05-25 DIAGNOSIS — E7800 Pure hypercholesterolemia, unspecified: Secondary | ICD-10-CM

## 2023-05-25 DIAGNOSIS — H9193 Unspecified hearing loss, bilateral: Secondary | ICD-10-CM

## 2023-05-25 DIAGNOSIS — Z1211 Encounter for screening for malignant neoplasm of colon: Secondary | ICD-10-CM

## 2023-05-25 NOTE — Patient Instructions (Addendum)
-  It was a pleasure to meet you and look forward to taking care of you.  -Referral placed to GI for colonoscopy. Please call the office or send a MyChart message if you do not hear about an appointment or receive a MyChart message for an appointment. -Influenza vaccine provided today. -Recommend to obtain your covid booster, Tdap, pneumonia, and zoster vaccines at your local pharmacy where your insurance will cover the cost. -Please get in touch with ortho about knee surgery. Once you have a surgery date, please make an appointment for a surgical clearance within 30 days of surgery. -Please make an Annual Wellness Exam for office visit since you are new to Medicare within the last 12 months.  -Discussed about possibly starting medication for elevated cholesterol. For the time being, decided to work on diet modifications and participating on a regular exercise routine.  -As of now, will not manage any medications. However, if Dr. Dalbert Garnet with [facility] would like this provider to manage any of your chronic medications, we will need to follow up.

## 2023-05-25 NOTE — Progress Notes (Signed)
New Patient Office Visit  Subjective    Patient ID: Cheryl [name], female    DOB: 05-31-1956  Age: 67 y.o. MRN: [id number]  CC:  Chief Complaint  Patient presents with   Establish Care    Knee surgery and hearing with audiologist - Dr Eulah Pont is ortho specialist    HPI Cheryl [name] presents to establish care with new provider.   Patients previous primary care provider: [facility] at [facility] with Dr. Robert Bellow. Last visit was years ago.   Specialist: [facility] at [employer]. [facility], Dr. Margarita Rana  [facility] at [facility], Valentina Shaggy  Dermatology Specialist PA with Dr. Lodema Hong Vision Specialist with Dr. Jimmye Norman  [facility] with Dr. Ileene Patrick   Patient is looking at having knee surgery with Dr. Margarita Rana by the end of the year. She will need a surgical clearance. She has not scheduled an appointment for surgery.   Patient recently had labs on 09/24 with Dr. Dalbert Garnet. She had elevated LDL and total cholesterol. All other labs were stable. Patient is not taking any cholesterol medication.  Patients 10-year ASCVD risk score is: 8.4%.  Patient is request a referral to audiologist for complaints of decreasing hearing in both ears.  Outpatient Encounter Medications as of 05/25/2023  Medication Sig   acetaminophen (TYLENOL) 650 MG CR tablet Take 650 mg by mouth as needed for pain.   aspirin 81 MG chewable tablet Chew 81 mg by mouth daily.   buPROPion (WELLBUTRIN SR) 200 MG 12 hr tablet Take 1 tablet (200 mg total) by mouth daily.   Cholecalciferol (VITAMIN D3) 25 MCG (1000 UT) CAPS Take 1 capsule (1,000 Units total) by mouth daily.   docusate sodium (COLACE) 100 MG capsule Take 100 mg by mouth daily.   Loratadine (CLARITIN PO) Take 1 tablet by mouth daily.    losartan-hydrochlorothiazide (HYZAAR) 100-12.5 MG tablet Take 1 tablet by mouth  daily.   metFORMIN (GLUCOPHAGE) 500 MG tablet Take 1 tablet (500 mg total) by mouth 2 (two) times daily with a meal.   UNABLE TO FIND C PAP for sleep apnea    venlafaxine (EFFEXOR) 75 MG tablet Take 1 tablet (75 mg total) by mouth daily.   [DISCONTINUED] meloxicam (MOBIC) 15 MG tablet TAKE 1 TABLET(15 MG) BY MOUTH AT BEDTIME (Patient not taking: Reported on 05/25/2023)   No facility-administered encounter medications on file as of 05/25/2023.    Past Medical History:  Diagnosis Date   Allergy    Arthritis    Chest pain    Depression    Fatigue    GERD (gastroesophageal reflux disease)    Hyperlipidemia    Hypertension    Joint pain    Obesity    Osteopenia    Palpitation    Sleep apnea    uses CPAP    SOB (shortness of breath)    Urinary incontinence     Past Surgical History:  Procedure Laterality Date   ABDOMINAL HYSTERECTOMY  08/10/2000   TAH/RSO   BREAST SURGERY     Biopsy-Benign   COLOSTOMY  2000   Dr.Patterson   KNEE ARTHROSCOPY     left knee   LAPAROSCOPIC CHOLECYSTECTOMY  2010   LAPAROTOMY  1990   Fibroid removed-ovarian cyst    RETINAL DETACHMENT SURGERY Right    It was torn   RHINOPLASTY      Family History  Problem Relation Age of Onset   Breast cancer Mother        Age 28   Hypertension Mother    Depression Mother    Arthritis Mother    Cancer Mother    COPD Mother    Hypertension Father    Arthritis Father    Heart disease Father    Cancer Father        Spleen   Sleep apnea Father    Obesity Paternal Aunt    Obesity Paternal Uncle    Hearing loss Maternal Grandmother    Heart disease Maternal Grandfather    Colon cancer Paternal Grandfather        Colon cancer   Esophageal cancer Neg Hx    Stomach cancer Neg Hx     Social History   Socioeconomic History   Marital status: Married    Spouse name: Francene Mcerlean   Number of children: 2   Years of education: 16   Highest education level: Bachelor's degree (e.g., BA, AB, BS)   Occupational History   Occupation: Lawyer  Tobacco Use   Smoking status: Former    Current packs/day: 0.00    Average packs/day: 0.5 packs/day for 10.0 years (5.0 ttl pk-yrs)    Types: Cigarettes    Quit date: 08/15/2007    Years since quitting: 15.7   Smokeless tobacco: Never  Vaping Use   Vaping status: Never Used  Substance and Sexual Activity   Alcohol use: Yes    Alcohol/week: 5.0 standard drinks of alcohol    Types: 2 Glasses of wine, 3 Standard drinks or equivalent per week    Comment: 3 drinks per week per pt    Drug use: No   Sexual activity: Not Currently    Birth control/protection: Post-menopausal, Surgical    Comment: hysterectomy  Other Topics Concern   Not on file  Social History Narrative   2 adopted children, Eric (17) and Buyer, retail (18)   Social Determinants of Health   Financial Resource Strain: Low Risk  (05/20/2023)   Overall Financial Resource Strain (CARDIA)    Difficulty of Paying Living Expenses: Not hard at all  Food Insecurity: No Food Insecurity (05/20/2023)   Hunger Vital Sign    Worried About Running Out of Food in the Last Year: Never true    Ran Out of Food in the Last Year: Never true  Transportation Needs: No Transportation Needs (05/20/2023)   PRAPARE - Administrator, Civil Service (Medical): No    Lack of Transportation (Non-Medical): No  Physical Activity: Insufficiently Active (05/20/2023)   Exercise Vital Sign    Days of Exercise per Week: 1 day    Minutes of Exercise per Session: 30 min  Stress: Stress Concern Present (05/20/2023)   [employer] of Occupational Health - Occupational Stress Questionnaire    Feeling of Stress : To some extent  Social Connections: Socially Integrated (05/20/2023)   Social Connection and Isolation Panel [NHANES]    Frequency of Communication with Friends and Family: More than three times a week    Frequency of Social Gatherings with Friends and Family: More than three times a week     Attends Religious Services: More than 4 times per year    Active Member of [employer] or Organizations: Yes    Attends Banker Meetings: More than 4 times per year    Marital Status: Married  Catering manager Violence: Not At Risk (05/25/2023)   Humiliation, Afraid, Rape,  and Kick questionnaire    Fear of Current or Ex-Partner: No    Emotionally Abused: No    Physically Abused: No    Sexually Abused: No    ROS See HPI above    Objective   BP 124/82 (BP Location: Left Arm, Patient Position: Sitting, Cuff Size: Large)   Pulse 70   Temp 98.2 F (36.8 C) (Oral)   Ht 5\' 6"  (1.676 m)   Wt 234 lb 9.6 oz (106.4 kg)   SpO2 95%   BMI 37.87 kg/m   Physical Exam Vitals reviewed.  Constitutional:      General: She is not in acute distress.    Appearance: Normal appearance. She is obese. She is not ill-appearing, toxic-appearing or diaphoretic.  HENT:     Head: Normocephalic and atraumatic.  Eyes:     General:        Right eye: No discharge.        Left eye: No discharge.     Conjunctiva/sclera: Conjunctivae normal.  Cardiovascular:     Rate and Rhythm: Normal rate and regular rhythm.     Heart sounds: Normal heart sounds. No murmur heard.    No friction rub. No gallop.  Pulmonary:     Effort: Pulmonary effort is normal. No respiratory distress.     Breath sounds: Normal breath sounds.  Musculoskeletal:        General: Normal range of motion.  Skin:    General: Skin is warm and dry.  Neurological:     General: No focal deficit present.     Mental Status: She is alert and oriented to person, place, and time. Mental status is at baseline.  Psychiatric:        Mood and Affect: Mood normal.        Behavior: Behavior normal.        Thought Content: Thought content normal.        Judgment: Judgment normal.      Assessment & Plan:  Colon cancer screening -     Ambulatory referral to Gastroenterology  Need for influenza vaccination -     Flu Vaccine Trivalent  High Dose (Fluad)  Decreased hearing of both ears -     Ambulatory referral to Audiology   1.Review health maintenance:  -Colonoscopy: Referral placed to GI for colonoscopy. Last colonoscopy was 12/2017. -Covid booster: Recommend to obtain at local pharmacy  -Influenza vaccine: Ordered and administered -Tdap vaccine: Recommend to obtain at local pharmacy  -Hep C screening: Will hold until labs need to be drawn.  -AWV- schedule for in office visit -PNA vaccine: Recommend to obtain at local pharmacy  -Zoster vaccine: Recommend to obtain at local pharmacy  2.Advised to please get in touch with ortho about knee surgery. Once she has a surgery date, make an appointment for a surgical clearance within 30 days of surgery. 3.Discussed about possibly starting medication for elevated cholesterol. For the time being, decided to work on diet modifications and participating on a regular exercise routine.  4.As of now, will not manage any medications. However, if Dr. Dalbert Garnet with [facility] would like this provider to manage any of her chronic medications, we will need to follow up.  5.Placed a referral to audiologist for decreased hearing in both ears.  Return in about 1 month (around 06/25/2023) for AWV-in office, new to medicare .   Zandra Abts, NP

## 2023-05-26 ENCOUNTER — Encounter (INDEPENDENT_AMBULATORY_CARE_PROVIDER_SITE_OTHER): Payer: Self-pay | Admitting: Family Medicine

## 2023-06-05 ENCOUNTER — Encounter (INDEPENDENT_AMBULATORY_CARE_PROVIDER_SITE_OTHER): Payer: Self-pay | Admitting: Family Medicine

## 2023-06-05 ENCOUNTER — Ambulatory Visit (INDEPENDENT_AMBULATORY_CARE_PROVIDER_SITE_OTHER): Payer: Medicare HMO | Admitting: Family Medicine

## 2023-06-05 VITALS — BP 138/82 | HR 88 | Temp 97.7°F | Ht 66.0 in | Wt 228.0 lb

## 2023-06-05 DIAGNOSIS — Z6836 Body mass index (BMI) 36.0-36.9, adult: Secondary | ICD-10-CM | POA: Diagnosis not present

## 2023-06-05 DIAGNOSIS — R7303 Prediabetes: Secondary | ICD-10-CM

## 2023-06-05 DIAGNOSIS — I10 Essential (primary) hypertension: Secondary | ICD-10-CM | POA: Diagnosis not present

## 2023-06-05 DIAGNOSIS — F3289 Other specified depressive episodes: Secondary | ICD-10-CM

## 2023-06-05 DIAGNOSIS — E669 Obesity, unspecified: Secondary | ICD-10-CM | POA: Diagnosis not present

## 2023-06-05 DIAGNOSIS — F5089 Other specified eating disorder: Secondary | ICD-10-CM

## 2023-06-05 MED ORDER — VENLAFAXINE HCL 75 MG PO TABS
75.0000 mg | ORAL_TABLET | Freq: Every day | ORAL | 0 refills | Status: DC
Start: 2023-06-05 — End: 2023-07-03

## 2023-06-05 MED ORDER — LOSARTAN POTASSIUM-HCTZ 100-12.5 MG PO TABS
1.0000 | ORAL_TABLET | Freq: Every day | ORAL | 0 refills | Status: DC
Start: 2023-06-05 — End: 2023-07-03

## 2023-06-05 MED ORDER — BUPROPION HCL ER (SR) 200 MG PO TB12
200.0000 mg | ORAL_TABLET | Freq: Every day | ORAL | 0 refills | Status: DC
Start: 2023-06-05 — End: 2023-07-03

## 2023-06-05 MED ORDER — METFORMIN HCL 500 MG PO TABS: 500.0000 mg | ORAL_TABLET | Freq: Two times a day (BID) | ORAL | 0 refills | Status: DC

## 2023-06-05 NOTE — Progress Notes (Signed)
Chief Complaint:   OBESITY Cheryl [name] is here to discuss her progress with her obesity treatment plan along with follow-up of her obesity related diagnoses. Cheryl [name] is on keeping a food journal and adhering to recommended goals of 1200-1300 calories and 75+ grams of protein and states she is following her eating plan approximately 25% of the time. Cheryl [name] states she is walking for 30 minutes 2 times per week.  Today's visit was #: 80 Starting weight: 281 lbs Starting date: 02/06/2018 Today's weight: 228 lbs Today's date: 06/05/2023 Total lbs lost to date: 53 Total lbs lost since last in-office visit: 6  Interim History: The patient is doing well with her weight loss.  She is finding healthier choices and meeting her protein goals.  She has tried to increase her exercise.  Subjective:   1. Essential hypertension Patient's blood pressure is controlled on her medications.  She is working on her weight loss to improve.  2. Prediabetes Patient is doing very well with her diet, and she has no problems with metformin.  She requests a refill today.  3. Emotional Eating Behavior Patient is stable on her medications, and she is working on decreasing emotional eating behavior.  No side effects were noted on her medications, and she feels she is doing well on her current dose.  Assessment/Plan:   1. Essential hypertension We will refill Hyzaar 100-12.5 mg once daily for 1 month.  Patient will continue with her diet, exercise, and weight loss.  - losartan-hydrochlorothiazide (HYZAAR) 100-12.5 MG tablet; Take 1 tablet by mouth daily.  Dispense: 30 tablet; Refill: 0  2. Prediabetes We will refill metformin 500 mg twice daily for 1 month.  Patient will continue with her diet, exercise, and weight loss.  - metFORMIN (GLUCOPHAGE) 500 MG tablet; Take 1 tablet (500 mg total) by mouth 2 (two) times daily with a meal.  Dispense: 60 tablet; Refill: 0  3. Emotional Eating Behavior Patient will  continue her medications, and we will refill Effexor 75 mg once daily and Wellbutrin SR 200 mg once daily for 1 month.  - venlafaxine (EFFEXOR) 75 MG tablet; Take 1 tablet (75 mg total) by mouth daily.  Dispense: 30 tablet; Refill: 0 - buPROPion (WELLBUTRIN SR) 200 MG 12 hr tablet; Take 1 tablet (200 mg total) by mouth daily.  Dispense: 30 tablet; Refill: 0  4. BMI 36.0-36.9,adult  5. Obesity, Beginning BMI 45.35 Cheryl [name] is currently in the action stage of change. As such, her goal is to continue with weight loss efforts. She has agreed to keeping a food journal and adhering to recommended goals of 1200-1300 calories and 75+ grams of protein daily.   Exercise goals: For substantial health benefits, adults should do at least 150 minutes (2 hours and 30 minutes) a week of moderate-intensity, or 75 minutes (1 hour and 15 minutes) a week of vigorous-intensity aerobic physical activity, or an equivalent combination of moderate- and vigorous-intensity aerobic activity. Aerobic activity should be performed in episodes of at least 10 minutes, and preferably, it should be spread throughout the week.  Behavioral modification strategies: increasing lean protein intake and holiday eating strategies .  Cheryl [name] has agreed to follow-up with our clinic in 4 weeks. She was informed of the importance of frequent follow-up visits to maximize her success with intensive lifestyle modifications for her multiple health conditions.   Objective:   Blood pressure 138/82, pulse 88, temperature 97.7 F (36.5 C), height 5\' 6"  (1.676 m), weight 228 lb (103.4 kg), SpO2  98%. Body mass index is 36.8 kg/m.  Lab Results  Component Value Date   CREATININE 0.92 05/08/2023   BUN 25 05/08/2023   NA 140 05/08/2023   K 4.9 05/08/2023   CL 102 05/08/2023   CO2 23 05/08/2023   Lab Results  Component Value Date   ALT 16 05/08/2023   AST 17 05/08/2023   ALKPHOS 96 05/08/2023   BILITOT 0.4 05/08/2023   Lab Results   Component Value Date   HGBA1C 5.3 05/08/2023   HGBA1C 5.7 (H) 11/16/2022   HGBA1C 5.6 04/13/2022   HGBA1C 5.4 06/29/2021   HGBA1C 5.2 10/14/2020   Lab Results  Component Value Date   INSULIN 9.3 05/08/2023   INSULIN 7.0 11/16/2022   INSULIN 4.4 04/13/2022   INSULIN 7.0 06/29/2021   INSULIN 5.5 10/14/2020   Lab Results  Component Value Date   TSH 3.680 11/16/2022   Lab Results  Component Value Date   CHOL 240 (H) 05/08/2023   HDL 76 05/08/2023   LDLCALC 147 (H) 05/08/2023   TRIG 98 05/08/2023   CHOLHDL 3.1 09/27/2017   Lab Results  Component Value Date   VD25OH 62.9 05/08/2023   VD25OH 50.4 11/16/2022   VD25OH 38.1 04/13/2022   Lab Results  Component Value Date   WBC 4.7 04/13/2022   HGB 14.2 04/13/2022   HCT 43.6 04/13/2022   MCV 88 04/13/2022   PLT 237 04/13/2022   No results found for: "IRON", "TIBC", "FERRITIN"  Attestation Statements:   Reviewed by clinician on day of visit: allergies, medications, problem list, medical history, surgical history, family history, social history, and previous encounter notes.   I, Burt Knack, am acting as transcriptionist for Quillian Quince, MD.  I have reviewed the above documentation for accuracy and completeness, and I agree with the above. -  Quillian Quince, MD

## 2023-06-11 ENCOUNTER — Ambulatory Visit: Payer: Medicare HMO | Admitting: Family Medicine

## 2023-06-13 ENCOUNTER — Ambulatory Visit (INDEPENDENT_AMBULATORY_CARE_PROVIDER_SITE_OTHER): Payer: Medicare HMO | Admitting: Family Medicine

## 2023-06-13 ENCOUNTER — Encounter: Payer: Self-pay | Admitting: Family Medicine

## 2023-06-13 VITALS — BP 130/84 | HR 67 | Temp 98.3°F | Wt 235.8 lb

## 2023-06-13 DIAGNOSIS — Z01818 Encounter for other preprocedural examination: Secondary | ICD-10-CM

## 2023-06-13 NOTE — Progress Notes (Unsigned)
   Established Patient Office Visit   Subjective:  Patient ID: Cheryl [name], female    DOB: July 30, 1956  Age: 67 y.o. MRN: [id number]  Chief Complaint  Patient presents with   Pre-op Exam    TKA left    HPI Patient is present for a surgical clearance for left total knee arthroplasty with Dr. Margarita Rana. Surgical day is pending until clearance is completed. She is suppose to have CBC, BMET, and A1c completed by orthopedic office. However, she has recently had an A1c (stable 5.3), and CMP completed. Will include these labs with her preoperative assessment. CBC will only need to be completed, if surgery date is near.  She denies any chest pain, shortness of breath, palpitations, or urinary symptoms.   ROS See HPI above     Objective:   BP 130/84 (BP Location: Right Arm, Patient Position: Sitting, Cuff Size: Normal)   Pulse 67   Temp 98.3 F (36.8 C) (Oral)   Wt 235 lb 12.8 oz (107 kg)   SpO2 97%   BMI 38.06 kg/m    Physical Exam Vitals reviewed.  Constitutional:      General: She is not in acute distress.    Appearance: Normal appearance. She is obese. She is not ill-appearing, toxic-appearing or diaphoretic.  HENT:     Head: Normocephalic and atraumatic.  Eyes:     General:        Right eye: No discharge.        Left eye: No discharge.     Conjunctiva/sclera: Conjunctivae normal.  Cardiovascular:     Rate and Rhythm: Normal rate and regular rhythm.     Heart sounds: Normal heart sounds. No murmur heard.    No friction rub. No gallop.  Pulmonary:     Effort: Pulmonary effort is normal. No respiratory distress.     Breath sounds: Normal breath sounds.  Musculoskeletal:        General: Normal range of motion.  Skin:    General: Skin is warm and dry.  Neurological:     General: No focal deficit present.     Mental Status: She is alert and oriented to person, place, and time. Mental status is at baseline.  Psychiatric:        Mood and Affect: Mood normal.         Behavior: Behavior normal.        Thought Content: Thought content normal.        Judgment: Judgment normal.   EKG completed for surgical clearance with a history of chest pain (years ago), hyperlipidemia, HTN, palpitations (years ago), and SHOB (years ago). NSR with no ST elevation, a rate of 62. No changes compared to previous EKG on 02/06/2018.    Assessment & Plan:  Preoperative clearance  -Patient is cleared from a medical standpoint to have a left total knee arthroplasty.  -EKG was completed due to history of chest pain (years ago), hyperlipidemia, HTN, palpitations (years ago), and SHOB (years ago). NSR with no ST elevation, and no change compared to previous EKG.  -Denies chest pain, SHOB, palpitations, or any urinary symptoms. Informed if symptoms occurred from now to the time she has surgery, please follow up.   Zandra Abts, NP

## 2023-06-14 NOTE — Patient Instructions (Addendum)
-  Will sign off on surgical clearance for a left total knee arthroplasty.  -EKG completed today with no concerns.  -If you develop chest pain, shortness of breath, palpitations, or any urinary symptoms from now to the time of your surgery, please let follow up.

## 2023-06-26 ENCOUNTER — Encounter: Payer: Self-pay | Admitting: Family Medicine

## 2023-06-26 ENCOUNTER — Ambulatory Visit (INDEPENDENT_AMBULATORY_CARE_PROVIDER_SITE_OTHER): Payer: Medicare HMO | Admitting: Family Medicine

## 2023-06-26 VITALS — BP 132/80 | HR 102 | Temp 97.7°F | Ht 66.0 in | Wt 232.6 lb

## 2023-06-26 DIAGNOSIS — Z0000 Encounter for general adult medical examination without abnormal findings: Secondary | ICD-10-CM | POA: Diagnosis not present

## 2023-06-26 NOTE — Patient Instructions (Signed)
-  Annual Wellness Exam Completed today with no concerns.  -Recommend to obtain vaccines at your local pharmacy.  -Continue a healthy diet and participate in regular exercise.  -Follow up in 6 months for a physical exam and foot exam.

## 2023-06-26 NOTE — Progress Notes (Signed)
Subjective:   Cheryl [name] is a 67 y.o. female who presents for an Initial Medicare Annual Wellness Visit.  Visit Complete: In person  Patient Medicare AWV questionnaire was completed by the patient on Cheryl [name]; I have confirmed that all information answered by patient is correct and no changes since this date.     Objective:    Today's Vitals   06/26/23 0954 06/26/23 1054  BP: (!) 136/90 132/80  Pulse: (!) 102   Temp: 97.7 F (36.5 C)   TempSrc: Oral   SpO2: 96%   Weight: 232 lb 9.6 oz (105.5 kg)   Height: 5\' 6"  (1.676 m)    Body mass index is 37.54 kg/m.     06/26/2023    9:49 AM 05/27/2015    8:11 AM  Advanced Directives  Does Patient Have a Medical Advance Directive? Yes No  Type of Advance Directive Living will   Does patient want to make changes to medical advance directive? Yes (Inpatient - patient defers changing a medical advance directive at this time - Information given)     Current Medications (verified) Outpatient Encounter Medications as of 06/26/2023  Medication Sig   acetaminophen (TYLENOL) 650 MG CR tablet Take 650 mg by mouth as needed for pain.   aspirin 81 MG chewable tablet Chew 81 mg by mouth daily.   buPROPion (WELLBUTRIN SR) 200 MG 12 hr tablet Take 1 tablet (200 mg total) by mouth daily.   Cholecalciferol (VITAMIN D3) 25 MCG (1000 UT) CAPS Take 1 capsule (1,000 Units total) by mouth daily.   docusate sodium (COLACE) 100 MG capsule Take 100 mg by mouth daily.   Loratadine (CLARITIN PO) Take 1 tablet by mouth daily.    losartan-hydrochlorothiazide (HYZAAR) 100-12.5 MG tablet Take 1 tablet by mouth daily.   metFORMIN (GLUCOPHAGE) 500 MG tablet Take 1 tablet (500 mg total) by mouth 2 (two) times daily with a meal.   UNABLE TO FIND C PAP for sleep apnea    venlafaxine (EFFEXOR) 75 MG tablet Take 1 tablet (75 mg total) by mouth daily.   No facility-administered encounter medications on file as of 06/26/2023.   Allergies  (verified) Ace inhibitors and Lisinopril   History: Past Medical History:  Diagnosis Date   Allergy    Arthritis    Chest pain    Depression    Fatigue    GERD (gastroesophageal reflux disease)    Hyperlipidemia    Hypertension    Joint pain    Obesity    Osteopenia    Palpitation    Sleep apnea    uses CPAP    SOB (shortness of breath)    Urinary incontinence    Past Surgical History:  Procedure Laterality Date   ABDOMINAL HYSTERECTOMY  08/10/2000   TAH/RSO   BREAST SURGERY     Biopsy-Benign   COLOSTOMY  2000   Dr.Patterson   KNEE ARTHROSCOPY Bilateral    LAPAROSCOPIC CHOLECYSTECTOMY  2010   LAPAROTOMY  1990   Fibroid removed-ovarian cyst    RETINAL DETACHMENT SURGERY Right    It was torn   RHINOPLASTY     Family History  Problem Relation Age of Onset   Breast cancer Mother        Age 31   Hypertension Mother    Depression Mother    Arthritis Mother    Cancer Mother    COPD Mother    Hypertension Father    Arthritis Father    Heart disease  Father    Cancer Father        Spleen   Sleep apnea Father    Obesity Paternal Aunt    Obesity Paternal Uncle    Hearing loss Maternal Grandmother    Heart disease Maternal Grandfather    Colon cancer Paternal Grandfather        Colon cancer   Esophageal cancer Neg Hx    Stomach cancer Neg Hx    Social History   Socioeconomic History   Marital status: Married    Spouse name: Cheryl [name]   Number of children: 2   Years of education: 16   Highest education level: Bachelor's degree (e.g., BA, AB, BS)  Occupational History   Occupation: Lawyer  Tobacco Use   Smoking status: Former    Current packs/day: 0.00    Average packs/day: 0.5 packs/day for 10.0 years (5.0 ttl pk-yrs)    Types: Cigarettes    Quit date: 08/15/2007    Years since quitting: 15.8   Smokeless tobacco: Never  Vaping Use   Vaping status: Never Used  Substance and Sexual Activity   Alcohol use: Yes    Alcohol/week:  5.0 standard drinks of alcohol    Types: 2 Glasses of wine, 3 Standard drinks or equivalent per week    Comment: 3 drinks per week per pt    Drug use: No   Sexual activity: Not Currently    Birth control/protection: Post-menopausal, Surgical    Comment: hysterectomy  Other Topics Concern   Not on file  Social History Narrative   2 adopted children, Eric (17) and Buyer, retail (18)   Social Determinants of Health   Financial Resource Strain: Low Risk  (06/26/2023)   Overall Financial Resource Strain (CARDIA)    Difficulty of Paying Living Expenses: Not hard at all  Food Insecurity: No Food Insecurity (06/26/2023)   Hunger Vital Sign    Worried About Running Out of Food in the Last Year: Never true    Ran Out of Food in the Last Year: Never true  Transportation Needs: No Transportation Needs (06/26/2023)   PRAPARE - Administrator, Civil Service (Medical): No    Lack of Transportation (Non-Medical): No  Physical Activity: Insufficiently Active (06/26/2023)   Exercise Vital Sign    Days of Exercise per Week: 2 days    Minutes of Exercise per Session: 20 min  Stress: No Stress Concern Present (06/26/2023)   [employer] of Occupational Health - Occupational Stress Questionnaire    Feeling of Stress : Not at all  Recent Concern: Stress - Stress Concern Present (05/20/2023)   [employer] of Occupational Health - Occupational Stress Questionnaire    Feeling of Stress : To some extent  Social Connections: Socially Integrated (06/26/2023)   Social Connection and Isolation Panel [NHANES]    Frequency of Communication with Friends and Family: More than three times a week    Frequency of Social Gatherings with Friends and Family: Three times a week    Attends Religious Services: More than 4 times per year    Active Member of Clubs or Organizations: Yes    Attends Banker Meetings: More than 4 times per year    Marital Status: Married    Tobacco  Counseling Not necessary; Patient stopped smoking in 2009.   Clinical Intake: Pain : No/denies pain     Activities of Daily Living    06/26/2023    9:47 AM  In your present state of  health, do you have any difficulty performing the following activities:  Hearing? 1  Vision? 1  Difficulty concentrating or making decisions? 1  Walking or climbing stairs? 1  Dressing or bathing? 0  Doing errands, shopping? 0  Preparing Food and eating ? N  Using the Toilet? N  In the past six months, have you accidently leaked urine? Y  Do you have problems with loss of bowel control? N  Managing your Medications? N  Managing your Finances? N  Housekeeping or managing your Housekeeping? N    Patient Care Team: Alveria Apley, NP as PCP - General (Family Medicine) Jerene Bears, MD as Consulting Physician (Gynecology) Specialists, [facility] (Orthopedic Surgery) Carman Ching, OD (Optometry) Shelah Lewandowsky, MD as Consulting Physician (Optometry) Armbruster, Willaim Rayas, MD as Consulting Physician (Gastroenterology) Wilder Glade, MD as Consulting Physician (Family Medicine)  Indicate any recent Medical Services you may have received from other than [facility] providers in the past year (date may be approximate).     Assessment:   This is a routine wellness examination for Daanya.  Hearing/Vision screen Hearing Screening   500Hz  1000Hz  2000Hz  4000Hz   Right ear Fail Pass Fail Fail  Left ear Fail Pass Fail Fail   Vision Screening   Right eye Left eye Both eyes  Without correction 0 0 0  With correction 0 20/50 20/50     Goals Addressed             This Visit's Progress    Client will report no fall or injuries in the next 12 months.        Depression Screen    06/26/2023    9:45 AM 06/13/2023   11:44 AM 05/25/2023   11:49 AM 02/06/2018    7:25 AM  PHQ 2/9 Scores  PHQ - 2 Score 1 1 0 4  PHQ- 9 Score 3 4  13   Exception Documentation    Medical reason     Fall Risk    06/26/2023    9:49 AM 06/13/2023   11:44 AM  Fall Risk   Falls in the past year? 0 0  Number falls in past yr: 0 0  Injury with Fall? 0 0  Risk for fall due to : No Fall Risks No Fall Risks  Follow up Falls evaluation completed Falls evaluation completed    MEDICARE RISK AT HOME: Medicare Risk at Home Any stairs in or around the home?: Yes If so, are there any without handrails?: Yes Home free of loose throw rugs in walkways, pet beds, electrical cords, etc?: No Adequate lighting in your home to reduce risk of falls?: Yes Life alert?: No Use of a cane, walker or w/c?: No Grab bars in the bathroom?: Yes Shower chair or bench in shower?: No Elevated toilet seat or a handicapped toilet?: Yes  TIMED UP AND GO: Was the test performed? No    Cognitive Function:    06/26/2023    9:51 AM  6CIT Screen  What Year? 0 points  What month? 0 points  What time? 0 points  Count back from 20 0 points  Months in reverse 0 points  Repeat phrase 0 points  Total Score 0 points   Immunizations Immunization History  Administered Date(s) Administered   Fluad Trivalent(High Dose 65+) 05/25/2023   Influenza Split 06/05/2012   Influenza,inj,Quad PF,6+ Mos 04/30/2013, 05/26/2018, 05/10/2019   Influenza-Unspecified 06/16/2014, 06/18/2017, 05/23/2018, 05/15/2019   Td 08/14/1998   Tdap 08/14/2010  TDAP status: Due, Education has been provided regarding the importance of this vaccine. Advised may receive this vaccine at local pharmacy or Health Dept. Aware to provide a copy of the vaccination record if obtained from local pharmacy or Health Dept. Verbalized acceptance and understanding.  Flu Vaccine status: Up to date  Pneumococcal vaccine status: Due, Education has been provided regarding the importance of this vaccine. Advised may receive this vaccine at local pharmacy or Health Dept. Aware to provide a copy of the vaccination record if obtained from local pharmacy or  Health Dept. Verbalized acceptance and understanding.  Covid-19 vaccine status: Information provided on how to obtain vaccines.   Qualifies for Shingles Vaccine? Yes   Zostavax completed No   Shingrix Completed?: No.    Education has been provided regarding the importance of this vaccine. Patient has been advised to call insurance company to determine out of pocket expense if they have not yet received this vaccine. Advised may also receive vaccine at local pharmacy or Health Dept. Verbalized acceptance and understanding.  Screening Tests Health Maintenance  Topic Date Due   Zoster Vaccines- Shingrix (1 of 2) Never done   DTaP/Tdap/Td (3 - Td or Tdap) 08/14/2020   Colonoscopy  12/19/2020   Pneumonia Vaccine 13+ Years old (1 of 1 - PCV) Never done   COVID-19 Vaccine (1 - 2023-24 season) 07/11/2023 (Originally 04/15/2023)   Cervical Cancer Screening (Pap smear)  05/24/2024 (Originally 01/12/2012)   Hepatitis C Screening  06/24/2024 (Originally 03/21/1974)   Medicare Annual Wellness (AWV)  06/25/2024   MAMMOGRAM  10/30/2024   INFLUENZA VACCINE  Completed   DEXA SCAN  Completed   HPV VACCINES  Aged Out    Health Maintenance  Health Maintenance Due  Topic Date Due   Zoster Vaccines- Shingrix (1 of 2) Never done   DTaP/Tdap/Td (3 - Td or Tdap) 08/14/2020   Colonoscopy  12/19/2020   Pneumonia Vaccine 63+ Years old (1 of 1 - PCV) Never done    Colorectal Cancer Screening: Colonoscopy scheduled for 12/10//2024 with [facility], Dr. Ileene Patrick   Mammogram status: Completed 10/31/2022. Repeat every year  Bone Density status: Completed 11/02/2022. Results reflect: Bone density results: OSTEOPENIA. Repeat every 2 years.  Lung Cancer Screening: (Low Dose CT Chest recommended if Age 68-80 years, 20 pack-year currently smoking OR have quit w/in 15years.) does not qualify.   Lung Cancer Screening Referral: NA  Additional Screening:  Hepatitis C Screening: does qualify; Completed:  No  Vision Screening: Recommended annual ophthalmology exams for early detection of glaucoma and other disorders of the eye. Is the patient up to date with their annual eye exam?  Yes  Who is the provider or what is the name of the office in which the patient attends annual eye exams? Dr. Jimmye Norman, [facility]  If pt is not established with a provider, would they like to be referred to a provider to establish care? No .   Dental Screening: Recommended annual dental exams for proper oral hygiene  Diabetic Foot Exam: Diabetic Foot Exam: Overdue, Pt has been advised about the importance in completing this exam. Pt is scheduled for diabetic foot exam on next visit .  Community Resource Referral / Chronic Care Management: CRR required this visit?  No   CCM required this visit?  No     Plan:     I have personally reviewed and noted the following in the patient's chart:   Medical and social history Use of alcohol, tobacco or  illicit drugs  Current medications and supplements including opioid prescriptions. Patient is not currently taking opioid prescriptions. Functional ability and status Nutritional status Physical activity Advanced directives List of other physicians Hospitalizations, surgeries, and ER visits in previous 12 months Vitals Screenings to include cognitive, depression, and falls Referrals and appointments  In addition, I have reviewed and discussed with patient certain preventive protocols, quality metrics, and best practice recommendations. A written personalized care plan for preventive services as well as general preventive health recommendations were provided to patient.     Zandra Abts, NP   06/26/2023   After Visit Summary: (In Person-Printed) AVS printed and given to the patient  Nurse Notes:

## 2023-07-02 ENCOUNTER — Other Ambulatory Visit (INDEPENDENT_AMBULATORY_CARE_PROVIDER_SITE_OTHER): Payer: Self-pay | Admitting: Family Medicine

## 2023-07-02 DIAGNOSIS — F3289 Other specified depressive episodes: Secondary | ICD-10-CM

## 2023-07-02 DIAGNOSIS — I10 Essential (primary) hypertension: Secondary | ICD-10-CM

## 2023-07-03 ENCOUNTER — Ambulatory Visit (INDEPENDENT_AMBULATORY_CARE_PROVIDER_SITE_OTHER): Payer: Medicare HMO | Admitting: Family Medicine

## 2023-07-03 ENCOUNTER — Encounter (INDEPENDENT_AMBULATORY_CARE_PROVIDER_SITE_OTHER): Payer: Self-pay | Admitting: Family Medicine

## 2023-07-03 VITALS — BP 134/82 | HR 73 | Temp 98.0°F | Ht 66.0 in | Wt 228.0 lb

## 2023-07-03 DIAGNOSIS — F5089 Other specified eating disorder: Secondary | ICD-10-CM | POA: Diagnosis not present

## 2023-07-03 DIAGNOSIS — I10 Essential (primary) hypertension: Secondary | ICD-10-CM

## 2023-07-03 DIAGNOSIS — R7303 Prediabetes: Secondary | ICD-10-CM | POA: Diagnosis not present

## 2023-07-03 DIAGNOSIS — E669 Obesity, unspecified: Secondary | ICD-10-CM | POA: Diagnosis not present

## 2023-07-03 DIAGNOSIS — Z6836 Body mass index (BMI) 36.0-36.9, adult: Secondary | ICD-10-CM | POA: Diagnosis not present

## 2023-07-03 DIAGNOSIS — F3289 Other specified depressive episodes: Secondary | ICD-10-CM

## 2023-07-03 MED ORDER — METFORMIN HCL 500 MG PO TABS: 500.0000 mg | ORAL_TABLET | Freq: Two times a day (BID) | ORAL | 0 refills | Status: DC

## 2023-07-03 MED ORDER — BUPROPION HCL ER (SR) 200 MG PO TB12: 200.0000 mg | ORAL_TABLET | Freq: Every day | ORAL | 0 refills | Status: DC

## 2023-07-03 MED ORDER — LOSARTAN POTASSIUM-HCTZ 100-12.5 MG PO TABS: 1.0000 | ORAL_TABLET | Freq: Every day | ORAL | 0 refills | Status: DC

## 2023-07-03 MED ORDER — VENLAFAXINE HCL 75 MG PO TABS: 75.0000 mg | ORAL_TABLET | Freq: Every day | ORAL | 0 refills | Status: DC

## 2023-07-03 NOTE — Progress Notes (Signed)
.[facility]  Office: [phone]  /  Fax: [phone]  WEIGHT SUMMARY AND BIOMETRICS  Anthropometric Measurements Height: 5\' 6"  (1.676 m) Weight: 228 lb (103.4 kg) BMI (Calculated): 36.82 Weight at Last Visit: 228 lb Weight Lost Since Last Visit: 0 Weight Gained Since Last Visit: 0 Starting Weight: 281 lb Total Weight Loss (lbs): 53 lb (24 kg)   Body Composition  Body Fat %: 48.5 % Fat Mass (lbs): 110.8 lbs Muscle Mass (lbs): 111.6 lbs Total Body Water (lbs): 78.2 lbs Visceral Fat Rating : 15   Other Clinical Data Fasting: No Labs: No Today's Visit #: 27 Starting Date: 02/06/18    Chief Complaint: OBESITY    History of Present Illness   The patient, with a history of prediabetes, hypertension, and obesity, presents for a routine follow-up and medication refill. She is currently on Effexor and Wellbutrin for emotional eating behaviors, Metformin for prediabetes, and Losartan and Hydrochlorothiazide for hypertension. She has been maintaining her weight over the past month and has been journaling her food intake about 25% of the time, with a calorie goal of 1200-1300 and a protein goal of 75 grams or more per day. She has been walking for exercise about two times per week for about 20 minutes each time.  The patient has been experiencing some challenges with emotional eating due to her father's deteriorating health under hospice care. She has been having some trouble sleeping and has been feeling the urge to comfort eat. Despite these challenges, she has been able to maintain her weight and has made some changes to her snacking habits to healthier alternatives.  The patient has a busy schedule of medical procedures in the coming months, including a colonoscopy, knee replacement surgery, and cataract surgery. She has also been recommended to get her Tdap and pneumonia vaccines updated before her surgery. She has been experiencing some congestion and ringing in the ears, which she has  been managing with Claritin and is considering starting Mucinex if the symptoms persist. She has also started taking elderberry to boost her immune system.  The patient has a busy holiday season ahead with family gatherings for Thanksgiving. She is mindful of her eating habits during these events and is planning to bring a vegetable dish to one of the gatherings. She is also planning to continue with her exercise routine, considering chair yoga and other low-impact exercises to keep active, especially before her upcoming surgery.          PHYSICAL EXAM:  Blood pressure 134/82, pulse 73, temperature 98 F (36.7 C), height 5\' 6"  (1.676 m), weight 228 lb (103.4 kg), SpO2 99%. Body mass index is 36.8 kg/m.  DIAGNOSTIC DATA REVIEWED:  BMET    Component Value Date/Time   NA 140 05/08/2023 0910   K 4.9 05/08/2023 0910   CL 102 05/08/2023 0910   CO2 23 05/08/2023 0910   GLUCOSE 98 05/08/2023 0910   GLUCOSE 93 08/28/2018 1333   BUN 25 05/08/2023 0910   CREATININE 0.92 05/08/2023 0910   CREATININE 0.81 06/08/2016 1353   CALCIUM 9.4 05/08/2023 0910   GFRNONAA 92 06/10/2020 0953   GFRAA 106 06/10/2020 0953   Lab Results  Component Value Date   HGBA1C 5.3 05/08/2023   HGBA1C 5.8 (H) 02/06/2018   Lab Results  Component Value Date   INSULIN 9.3 05/08/2023   INSULIN 8.7 02/06/2018   Lab Results  Component Value Date   TSH 3.680 11/16/2022   CBC    Component Value Date/Time  WBC 4.7 04/13/2022 1110   WBC 5.0 06/08/2016 1353   RBC 4.98 04/13/2022 1110   RBC 4.94 06/08/2016 1353   HGB 14.2 04/13/2022 1110   HGB 14.3 04/02/2014 0937   HCT 43.6 04/13/2022 1110   PLT 237 04/13/2022 1110   MCV 88 04/13/2022 1110   MCH 28.5 04/13/2022 1110   MCH 27.5 06/08/2016 1353   MCHC 32.6 04/13/2022 1110   MCHC 31.2 (L) 06/08/2016 1353   RDW 13.4 04/13/2022 1110   Iron Studies No results found for: "IRON", "TIBC", "FERRITIN", "IRONPCTSAT" Lipid Panel     Component Value Date/Time    CHOL 240 (H) 05/08/2023 0910   TRIG 98 05/08/2023 0910   HDL 76 05/08/2023 0910   CHOLHDL 3.1 09/27/2017 1404   CHOLHDL 3.4 06/08/2016 1353   VLDL 19 06/08/2016 1353   LDLCALC 147 (H) 05/08/2023 0910   Hepatic Function Panel     Component Value Date/Time   PROT 6.9 05/08/2023 0910   ALBUMIN 4.2 05/08/2023 0910   AST 17 05/08/2023 0910   ALT 16 05/08/2023 0910   ALKPHOS 96 05/08/2023 0910   BILITOT 0.4 05/08/2023 0910      Component Value Date/Time   TSH 3.680 11/16/2022 0926   Nutritional Lab Results  Component Value Date   VD25OH 62.9 05/08/2023   VD25OH 50.4 11/16/2022   VD25OH 38.1 04/13/2022     Assessment and Plan    Prediabetes Prediabetes managed with metformin. Patient maintains weight with diet and exercise, journaling 25% of the time with a calorie goal of 1200-1300 and a protein goal of 75 grams/day. Walking twice weekly for 20 minutes. - Refill metformin  Hypertension Hypertension managed with losartan and hydrochlorothiazide. Blood pressure today is 134/82, indicating well-controlled hypertension. - Refill losartan - Refill hydrochlorothiazide  Obesity Obesity managed through diet and exercise. Patient maintains weight, mindful of emotional eating, and aims to keep BMI below 40 for knee surgery eligibility. - Continue current diet and exercise regimen - Consider chair yoga or low-impact exercises to reduce joint strain  Emotional Eating Emotional eating managed with Effexor and Wellbutrin. Patient aware of triggers, making healthier snack choices. Increased stress due to father's hospice care affecting sleep and emotional state. - Refill Effexor - Refill Wellbutrin  Congestion Mild congestion managed with Claritin. Increased ringing in ears likely due to congestion. Discussed Flonase as an alternative to Mucinex to minimize blood pressure impact. - Consider Flonase if congestion persists   Follow-up 4 weeks        She was informed of the  importance of frequent follow up visits to maximize her success with intensive lifestyle modifications for her multiple health conditions.    Quillian Quince, MD

## 2023-07-05 ENCOUNTER — Ambulatory Visit: Payer: Medicare HMO

## 2023-07-05 VITALS — Ht 66.0 in | Wt 228.0 lb

## 2023-07-05 DIAGNOSIS — Z860100 Personal history of colon polyps, unspecified: Secondary | ICD-10-CM

## 2023-07-05 MED ORDER — NA SULFATE-K SULFATE-MG SULF 17.5-3.13-1.6 GM/177ML PO SOLN: 1.0000 | Freq: Once | ORAL | 0 refills | Status: AC

## 2023-07-05 NOTE — Progress Notes (Signed)
 No egg or soy allergy known to patient  No issues known to pt with past sedation with any surgeries or procedures Patient denies ever being told they had issues or difficulty with intubation  No FH of Malignant Hyperthermia Pt is not on diet pills Pt is not on  home 02  Pt is not on blood thinners  Pt denies issues with constipation  No A fib or A flutter Have any cardiac testing pending--no  LOA: independent Prep: suprep   Patient's chart reviewed by Cathlyn Parsons CNRA prior to previsit and patient appropriate for the [facility].  Previsit completed and red dot placed by patient's name on their procedure day (on provider's schedule).     PV competed with patient. Prep instructions sent via mychart and home address. Goodrx coupon for [employer] provided to use for price reduction if needed.

## 2023-07-09 ENCOUNTER — Encounter: Payer: Self-pay | Admitting: Gastroenterology

## 2023-07-24 ENCOUNTER — Encounter: Payer: Self-pay | Admitting: Gastroenterology

## 2023-07-24 ENCOUNTER — Ambulatory Visit: Payer: Medicare HMO | Admitting: Gastroenterology

## 2023-07-24 VITALS — BP 135/78 | HR 63 | Temp 97.9°F | Resp 10 | Ht 66.0 in | Wt 228.0 lb

## 2023-07-24 DIAGNOSIS — G4730 Sleep apnea, unspecified: Secondary | ICD-10-CM | POA: Diagnosis not present

## 2023-07-24 DIAGNOSIS — K648 Other hemorrhoids: Secondary | ICD-10-CM | POA: Diagnosis not present

## 2023-07-24 DIAGNOSIS — D120 Benign neoplasm of cecum: Secondary | ICD-10-CM | POA: Diagnosis not present

## 2023-07-24 DIAGNOSIS — Z1211 Encounter for screening for malignant neoplasm of colon: Secondary | ICD-10-CM

## 2023-07-24 DIAGNOSIS — K635 Polyp of colon: Secondary | ICD-10-CM

## 2023-07-24 DIAGNOSIS — D122 Benign neoplasm of ascending colon: Secondary | ICD-10-CM

## 2023-07-24 DIAGNOSIS — Z860101 Personal history of adenomatous and serrated colon polyps: Secondary | ICD-10-CM | POA: Diagnosis not present

## 2023-07-24 DIAGNOSIS — I10 Essential (primary) hypertension: Secondary | ICD-10-CM | POA: Diagnosis not present

## 2023-07-24 DIAGNOSIS — K5730 Diverticulosis of large intestine without perforation or abscess without bleeding: Secondary | ICD-10-CM

## 2023-07-24 DIAGNOSIS — Z860100 Personal history of colon polyps, unspecified: Secondary | ICD-10-CM

## 2023-07-24 DIAGNOSIS — F32A Depression, unspecified: Secondary | ICD-10-CM | POA: Diagnosis not present

## 2023-07-24 MED ORDER — SODIUM CHLORIDE 0.9 % IV SOLN: 500.0000 mL | Freq: Once | INTRAVENOUS | Status: DC

## 2023-07-24 NOTE — Progress Notes (Signed)
Called to room to assist during endoscopic procedure.  Patient ID and intended procedure confirmed with present staff. Received instructions for my participation in the procedure from the performing physician.  

## 2023-07-24 NOTE — Progress Notes (Signed)
[facility] History and Physical   Primary Care Physician:  Alveria Apley, NP   Reason for Procedure:   History of colon polyps  Plan:    colonoscopy     HPI: Cheryl [name] is a 67 y.o. female  here for colonoscopy surveillance - history of 1cm SSP removed 12/2017.   Patient denies any bowel symptoms at this time. No family history of colon cancer known. Otherwise feels well without any cardiopulmonary symptoms.   I have discussed risks / benefits of anesthesia and endoscopic procedure with Bertrum Sol and they wish to proceed with the exams as outlined today.    Past Medical History:  Diagnosis Date   Allergy    Arthritis    Chest pain    Depression    Fatigue    GERD (gastroesophageal reflux disease)    Hyperlipidemia    Hypertension    Joint pain    Obesity    Osteopenia    Palpitation    Sleep apnea    uses CPAP    SOB (shortness of breath)    Urinary incontinence     Past Surgical History:  Procedure Laterality Date   ABDOMINAL HYSTERECTOMY  08/10/2000   TAH/RSO   BREAST SURGERY     Biopsy-Benign   COLOSTOMY  2000   Dr.Patterson   KNEE ARTHROSCOPY Bilateral    LAPAROSCOPIC CHOLECYSTECTOMY  2010   LAPAROTOMY  1990   Fibroid removed-ovarian cyst    RETINAL DETACHMENT SURGERY Right    It was torn   RHINOPLASTY      Prior to Admission medications   Medication Sig Start Date End Date Taking? Authorizing Provider  acetaminophen (TYLENOL) 650 MG CR tablet Take 650 mg by mouth daily at 6 (six) AM.   Yes [provider]  aspirin 81 MG chewable tablet Chew 81 mg by mouth daily.   Yes [provider]  buPROPion (WELLBUTRIN SR) 200 MG 12 hr tablet Take 1 tablet (200 mg total) by mouth daily. 07/03/23  Yes Beasley, Caren D, MD  Cholecalciferol (VITAMIN D3) 25 MCG (1000 UT) CAPS Take 1 capsule (1,000 Units total) by mouth daily. 07/11/22  Yes Beasley, Caren D, MD  docusate sodium (COLACE) 100 MG capsule Take  100 mg by mouth daily.   Yes [provider]  Loratadine (CLARITIN PO) Take 1 tablet by mouth daily.    Yes [provider]  losartan-hydrochlorothiazide (HYZAAR) 100-12.5 MG tablet Take 1 tablet by mouth daily. 07/03/23  Yes Quillian Quince D, MD  metFORMIN (GLUCOPHAGE) 500 MG tablet Take 1 tablet (500 mg total) by mouth 2 (two) times daily with a meal. 07/03/23  Yes Beasley, Caren D, MD  UNABLE TO FIND C PAP for sleep apnea    Yes [provider]  venlafaxine (EFFEXOR) 75 MG tablet Take 1 tablet (75 mg total) by mouth daily. 07/03/23  Yes Quillian Quince D, MD    Current Outpatient Medications  Medication Sig Dispense Refill   acetaminophen (TYLENOL) 650 MG CR tablet Take 650 mg by mouth daily at 6 (six) AM.     aspirin 81 MG chewable tablet Chew 81 mg by mouth daily.     buPROPion (WELLBUTRIN SR) 200 MG 12 hr tablet Take 1 tablet (200 mg total) by mouth daily. 90 tablet 0   Cholecalciferol (VITAMIN D3) 25 MCG (1000 UT) CAPS Take 1 capsule (1,000 Units total) by mouth daily. 60 capsule 0   docusate sodium (COLACE) 100 MG capsule Take 100  mg by mouth daily.     Loratadine (CLARITIN PO) Take 1 tablet by mouth daily.      losartan-hydrochlorothiazide (HYZAAR) 100-12.5 MG tablet Take 1 tablet by mouth daily. 90 tablet 0   metFORMIN (GLUCOPHAGE) 500 MG tablet Take 1 tablet (500 mg total) by mouth 2 (two) times daily with a meal. 180 tablet 0   UNABLE TO FIND C PAP for sleep apnea      venlafaxine (EFFEXOR) 75 MG tablet Take 1 tablet (75 mg total) by mouth daily. 90 tablet 0   Current Facility-Administered Medications  Medication Dose Route Frequency Provider Last Rate Last Admin   0.9 %  sodium chloride infusion  500 mL Intravenous Once Bryony Kaman, Willaim Rayas, MD        Allergies as of 07/24/2023 - Review Complete 07/24/2023  Allergen Reaction Noted   Ace inhibitors Cough 12/19/2011   Lisinopril Cough 01/05/2011    Family History  Problem Relation Age of Onset    Breast cancer Mother        Age 58   Hypertension Mother    Depression Mother    Arthritis Mother    Cancer Mother    COPD Mother    Hypertension Father    Arthritis Father    Heart disease Father    Cancer Father        Spleen   Sleep apnea Father    Obesity Paternal Aunt    Obesity Paternal Uncle    Hearing loss Maternal Grandmother    Heart disease Maternal Grandfather    Colon cancer Paternal Grandfather        Colon cancer   Stomach cancer Neg Hx    Colon polyps Neg Hx    Rectal cancer Neg Hx    Esophageal cancer Neg Hx     Social History   Socioeconomic History   Marital status: Married    Spouse name: Donnis Mckell   Number of children: 2   Years of education: 16   Highest education level: Bachelor's degree (e.g., BA, AB, BS)  Occupational History   Occupation: Lawyer  Tobacco Use   Smoking status: Former    Current packs/day: 0.00    Average packs/day: 0.5 packs/day for 10.0 years (5.0 ttl pk-yrs)    Types: Cigarettes    Quit date: 08/15/2007    Years since quitting: 15.9   Smokeless tobacco: Never  Vaping Use   Vaping status: Never Used  Substance and Sexual Activity   Alcohol use: Yes    Alcohol/week: 5.0 standard drinks of alcohol    Types: 2 Glasses of wine, 3 Standard drinks or equivalent per week    Comment: 3 drinks per week per pt    Drug use: No   Sexual activity: Not Currently    Birth control/protection: Post-menopausal, Surgical    Comment: hysterectomy  Other Topics Concern   Not on file  Social History Narrative   2 adopted children, Eric (17) and Buyer, retail (18)   Social Determinants of Health   Financial Resource Strain: Low Risk  (06/26/2023)   Overall Financial Resource Strain (CARDIA)    Difficulty of Paying Living Expenses: Not hard at all  Food Insecurity: No Food Insecurity (06/26/2023)   Hunger Vital Sign    Worried About Running Out of Food in the Last Year: Never true    Ran Out of Food in the Last Year:  Never true  Transportation Needs: No Transportation Needs (06/26/2023)   PRAPARE - Transportation  Lack of Transportation (Medical): No    Lack of Transportation (Non-Medical): No  Physical Activity: Insufficiently Active (06/26/2023)   Exercise Vital Sign    Days of Exercise per Week: 2 days    Minutes of Exercise per Session: 20 min  Stress: No Stress Concern Present (06/26/2023)   [employer] of Occupational Health - Occupational Stress Questionnaire    Feeling of Stress : Not at all  Recent Concern: Stress - Stress Concern Present (05/20/2023)   [employer] of Occupational Health - Occupational Stress Questionnaire    Feeling of Stress : To some extent  Social Connections: Socially Integrated (06/26/2023)   Social Connection and Isolation Panel [NHANES]    Frequency of Communication with Friends and Family: More than three times a week    Frequency of Social Gatherings with Friends and Family: Three times a week    Attends Religious Services: More than 4 times per year    Active Member of Clubs or Organizations: Yes    Attends Banker Meetings: More than 4 times per year    Marital Status: Married  Catering manager Violence: Not At Risk (06/26/2023)   Humiliation, Afraid, Rape, and Kick questionnaire    Fear of Current or Ex-Partner: No    Emotionally Abused: No    Physically Abused: No    Sexually Abused: No    Review of Systems: All other review of systems negative except as mentioned in the HPI.  Physical Exam: Vital signs BP 133/80   Pulse 79   Temp 97.9 F (36.6 C) (Temporal)   Ht 5\' 6"  (1.676 m)   Wt 228 lb (103.4 kg)   SpO2 99%   BMI 36.80 kg/m   General:   Alert,  Well-developed, pleasant and cooperative in NAD Lungs:  Clear throughout to auscultation.   Heart:  Regular rate and rhythm Abdomen:  Soft, nontender and nondistended.   Neuro/Psych:  Alert and cooperative. Normal mood and affect. A and O x 3  Harlin Rain,  MD [facility]

## 2023-07-24 NOTE — Progress Notes (Signed)
Sedate, gd SR, tolerated procedure well, VSS, report to RN 

## 2023-07-24 NOTE — Op Note (Signed)
[facility] Patient Name: Triniti Deni Procedure Date: 07/24/2023 11:04 AM MRN: [id number] Endoscopist: Viviann Spare P. Adela Lank , MD, [id number] Age: 67 Referring MD:  Date of Birth: [birth year] Gender: Female Account #: [id number] Procedure:                Colonoscopy Indications:              High risk colon cancer surveillance: Personal                            history of colonic polyps - 1cm serrated cecal                            polyp removed 12/2015 Medicines:                Monitored Anesthesia Care Procedure:                Pre-Anesthesia Assessment:                           - Prior to the procedure, a History and Physical                            was performed, and patient medications and                            allergies were reviewed. The patient's tolerance of                            previous anesthesia was also reviewed. The risks                            and benefits of the procedure and the sedation                            options and risks were discussed with the patient.                            All questions were answered, and informed consent                            was obtained. Prior Anticoagulants: The patient has                            taken no anticoagulant or antiplatelet agents. ASA                            Grade Assessment: III - A patient with severe                            systemic disease. After reviewing the risks and                            benefits, the patient was deemed in satisfactory  condition to undergo the procedure.                           After obtaining informed consent, the colonoscope                            was passed under direct vision. Throughout the                            procedure, the patient's blood pressure, pulse, and                            oxygen saturations were monitored continuously. The                            CF [id number] #[id number] was  introduced through the anus                            and advanced to the the cecum, identified by                            appendiceal orifice and ileocecal valve. The                            colonoscopy was performed without difficulty. The                            patient tolerated the procedure well. The quality                            of the bowel preparation was good. The ileocecal                            valve, appendiceal orifice, and rectum were                            photographed. Scope In: 11:17:42 AM Scope Out: 11:35:46 AM Scope Withdrawal Time: 0 hours 14 minutes 52 seconds  Total Procedure Duration: 0 hours 18 minutes 4 seconds  Findings:                 The perianal and digital rectal examinations were                            normal.                           Two sessile polyps were found in the cecum. The                            polyps were 2 mm in size. These polyps were removed                            with a cold snare. Resection and retrieval were  complete.                           A 3 mm polyp was found in the ascending colon. The                            polyp was sessile. The polyp was removed with a                            cold snare. Resection and retrieval were complete.                           Multiple small-mouthed diverticula were found in                            the sigmoid colon and ascending colon.                           Internal hemorrhoids were found during                            retroflexion. The hemorrhoids were small.                           The exam was otherwise without abnormality. Complications:            No immediate complications. Estimated blood loss:                            Minimal. Estimated Blood Loss:     Estimated blood loss was minimal. Impression:               - Two 2 mm polyps in the cecum, removed with a cold                            snare. Resected and  retrieved.                           - One 3 mm polyp in the ascending colon, removed                            with a cold snare. Resected and retrieved.                           - Diverticulosis in the sigmoid colon and in the                            ascending colon.                           - Internal hemorrhoids.                           - The examination was otherwise normal. Recommendation:           - Patient has a contact number available for  emergencies. The signs and symptoms of potential                            delayed complications were discussed with the                            patient. Return to normal activities tomorrow.                            Written discharge instructions were provided to the                            patient.                           - Resume previous diet.                           - Continue present medications.                           - Await pathology results. Viviann Spare P. Kwynn Schlotter, MD 07/24/2023 11:40:01 AM This report has been signed electronically.

## 2023-07-24 NOTE — Progress Notes (Signed)
Pt's states no medical or surgical changes since previsit or office visit. 

## 2023-07-24 NOTE — Patient Instructions (Signed)

## 2023-07-25 ENCOUNTER — Telehealth: Payer: Self-pay

## 2023-07-25 NOTE — Telephone Encounter (Signed)
  Follow up Call-     07/24/2023   10:29 AM  Call back number  Post procedure Call Back phone  # [phone]  Permission to leave phone message Yes     Patient questions:  Do you have a fever, pain , or abdominal swelling? No. Pain Score  0 *  Have you tolerated food without any problems? Yes.    Have you been able to return to your normal activities? Yes.    Do you have any questions about your discharge instructions: Diet   No. Medications  No. Follow up visit  No.  Do you have questions or concerns about your Care? No.  Actions: * If pain score is 4 or above: No action needed, pain <4.

## 2023-07-26 LAB — SURGICAL PATHOLOGY

## 2023-07-30 DIAGNOSIS — M1712 Unilateral primary osteoarthritis, left knee: Secondary | ICD-10-CM | POA: Diagnosis not present

## 2023-07-30 DIAGNOSIS — M25562 Pain in left knee: Secondary | ICD-10-CM | POA: Diagnosis not present

## 2023-07-31 ENCOUNTER — Ambulatory Visit (INDEPENDENT_AMBULATORY_CARE_PROVIDER_SITE_OTHER): Payer: Medicare HMO | Admitting: Family Medicine

## 2023-07-31 ENCOUNTER — Encounter (INDEPENDENT_AMBULATORY_CARE_PROVIDER_SITE_OTHER): Payer: Self-pay | Admitting: Family Medicine

## 2023-07-31 VITALS — BP 135/85 | HR 85 | Temp 97.8°F | Ht 66.0 in | Wt 230.0 lb

## 2023-07-31 DIAGNOSIS — I10 Essential (primary) hypertension: Secondary | ICD-10-CM | POA: Diagnosis not present

## 2023-07-31 DIAGNOSIS — Z7984 Long term (current) use of oral hypoglycemic drugs: Secondary | ICD-10-CM

## 2023-07-31 DIAGNOSIS — E669 Obesity, unspecified: Secondary | ICD-10-CM | POA: Diagnosis not present

## 2023-07-31 DIAGNOSIS — R7303 Prediabetes: Secondary | ICD-10-CM | POA: Diagnosis not present

## 2023-07-31 DIAGNOSIS — M25562 Pain in left knee: Secondary | ICD-10-CM | POA: Diagnosis not present

## 2023-07-31 DIAGNOSIS — Z6837 Body mass index (BMI) 37.0-37.9, adult: Secondary | ICD-10-CM | POA: Diagnosis not present

## 2023-07-31 NOTE — Progress Notes (Signed)
.[facility]  Office: [phone]  /  Fax: [phone]  WEIGHT SUMMARY AND BIOMETRICS  Anthropometric Measurements Height: 5\' 6"  (1.676 m) Weight: 230 lb (104.3 kg) BMI (Calculated): 37.14 Weight at Last Visit: 228 lb Weight Lost Since Last Visit: 0 Weight Gained Since Last Visit: 2lb Starting Weight: 281 lb Total Weight Loss (lbs): 51 lb (23.1 kg)   Body Composition  Body Fat %: 49.2 % Fat Mass (lbs): 113.4 lbs Muscle Mass (lbs): 111 lbs Total Body Water (lbs): 79.2 lbs Visceral Fat Rating : 15   Other Clinical Data Fasting: No Labs: No Today's Visit #: 56 Starting Date: 02/06/18    Chief Complaint: OBESITY   History of Present Illness   The patient, with a history of obesity and hypertension, presents for a routine follow-up. Her blood pressure is controlled at 135/85 on Losartan. She reports a weight gain of two pounds over the past month, which included the Thanksgiving holiday. She is attempting to manage her weight and blood pressure through diet and exercise, specifically by journaling her calorie intake and aiming for a goal of 07-1374 grams of protein daily. She estimates adherence to this dietary plan about 25% of the time. For physical activity, she walks for 30 minutes twice a week and aims to increase her overall movement.  In addition to her hypertension and obesity, the patient is also managing her blood glucose levels with Metformin, which she reports taking regularly without issue. Her most recent labs in September showed an A1c of 5.3, down from 5.7 previously.  The patient recently underwent a colonoscopy, during which three polyps were found and removed. She is awaiting pathology results but was advised by her gastroenterologist that the findings did not appear concerning. She is scheduled for a follow-up in five years.  The patient also reports an upcoming left knee replacement surgery scheduled for January 9th. She expresses a mix of anticipation and  apprehension about the procedure. She has been adhering to a protein-focused diet in preparation for the surgery to aid in her healing process.          PHYSICAL EXAM:  Blood pressure 135/85, pulse 85, temperature 97.8 F (36.6 C), height 5\' 6"  (1.676 m), weight 230 lb (104.3 kg), SpO2 98%. Body mass index is 37.12 kg/m.  DIAGNOSTIC DATA REVIEWED:  BMET    Component Value Date/Time   NA 140 05/08/2023 0910   K 4.9 05/08/2023 0910   CL 102 05/08/2023 0910   CO2 23 05/08/2023 0910   GLUCOSE 98 05/08/2023 0910   GLUCOSE 93 08/28/2018 1333   BUN 25 05/08/2023 0910   CREATININE 0.92 05/08/2023 0910   CREATININE 0.81 06/08/2016 1353   CALCIUM 9.4 05/08/2023 0910   GFRNONAA 92 06/10/2020 0953   GFRAA 106 06/10/2020 0953   Lab Results  Component Value Date   HGBA1C 5.3 05/08/2023   HGBA1C 5.8 (H) 02/06/2018   Lab Results  Component Value Date   INSULIN 9.3 05/08/2023   INSULIN 8.7 02/06/2018   Lab Results  Component Value Date   TSH 3.680 11/16/2022   CBC    Component Value Date/Time   WBC 4.7 04/13/2022 1110   WBC 5.0 06/08/2016 1353   RBC 4.98 04/13/2022 1110   RBC 4.94 06/08/2016 1353   HGB 14.2 04/13/2022 1110   HGB 14.3 04/02/2014 0937   HCT 43.6 04/13/2022 1110   PLT 237 04/13/2022 1110   MCV 88 04/13/2022 1110   MCH 28.5 04/13/2022 1110   MCH 27.5 06/08/2016 1353  MCHC 32.6 04/13/2022 1110   MCHC 31.2 (L) 06/08/2016 1353   RDW 13.4 04/13/2022 1110   Iron Studies No results found for: "IRON", "TIBC", "FERRITIN", "IRONPCTSAT" Lipid Panel     Component Value Date/Time   CHOL 240 (H) 05/08/2023 0910   TRIG 98 05/08/2023 0910   HDL 76 05/08/2023 0910   CHOLHDL 3.1 09/27/2017 1404   CHOLHDL 3.4 06/08/2016 1353   VLDL 19 06/08/2016 1353   LDLCALC 147 (H) 05/08/2023 0910   Hepatic Function Panel     Component Value Date/Time   PROT 6.9 05/08/2023 0910   ALBUMIN 4.2 05/08/2023 0910   AST 17 05/08/2023 0910   ALT 16 05/08/2023 0910   ALKPHOS 96  05/08/2023 0910   BILITOT 0.4 05/08/2023 0910      Component Value Date/Time   TSH 3.680 11/16/2022 0926   Nutritional Lab Results  Component Value Date   VD25OH 62.9 05/08/2023   VD25OH 50.4 11/16/2022   VD25OH 38.1 04/13/2022     Assessment and Plan    Left Knee Osteoarthritis Scheduled for left knee replacement surgery on August 23, 2023. Previous meniscus surgery but first joint replacement. Discussed expected pain levels, recovery process, and importance of protein intake for postoperative healing. Patient is in good health and age for the procedure, aiding recovery. - Prepare for left knee replacement surgery on August 23, 2023 - Review preoperative labs - Focus on protein intake for postoperative healing  Obesity Recent weight gain of 2 pounds, likely due to holiday season. Patient is working on diet and exercise, including calorie journaling and aiming for 1375 grams of protein, with 25% adherence. Engages in walking 30 minutes twice a week. Discussed holiday temptations and strategies to improve adherence. - Continue diet and exercise regimen - Encourage increased adherence to protein intake goals - Increase exercise frequency if possible  Hypertension Controlled with losartan. Blood pressure today is 135/85 mmHg. Patient is also working on diet and exercise to help control blood pressure. - Continue losartan - Monitor blood pressure regularly - Encourage continued diet and exercise modifications  Type 2 Diabetes Mellitus Well-controlled with metformin. Most recent A1c in September was 5.3%, improved from 5.7%. No recent issues with metformin adherence. Discussed importance of maintaining current regimen and monitoring blood glucose levels. - Continue metformin - Monitor blood glucose levels regularly - Ensure adherence to both doses of metformin   General Health Maintenance Actively engaged in health maintenance activities including diet, exercise, and regular  medical follow-ups. No new medications needed at this time. - Reschedule next visit to accommodate follow-up with Dr. Eulah Pont - Schedule follow-up visit in February.        She was informed of the importance of frequent follow up visits to maximize her success with intensive lifestyle modifications for her multiple health conditions.    Quillian Quince, MD

## 2023-08-18 DIAGNOSIS — R0789 Other chest pain: Secondary | ICD-10-CM | POA: Diagnosis not present

## 2023-08-18 DIAGNOSIS — S20211A Contusion of right front wall of thorax, initial encounter: Secondary | ICD-10-CM | POA: Diagnosis not present

## 2023-08-18 DIAGNOSIS — S20212A Contusion of left front wall of thorax, initial encounter: Secondary | ICD-10-CM | POA: Diagnosis not present

## 2023-08-23 DIAGNOSIS — M1712 Unilateral primary osteoarthritis, left knee: Secondary | ICD-10-CM | POA: Diagnosis not present

## 2023-08-23 DIAGNOSIS — M25762 Osteophyte, left knee: Secondary | ICD-10-CM | POA: Diagnosis not present

## 2023-08-23 DIAGNOSIS — G8918 Other acute postprocedural pain: Secondary | ICD-10-CM | POA: Diagnosis not present

## 2023-08-27 DIAGNOSIS — M1712 Unilateral primary osteoarthritis, left knee: Secondary | ICD-10-CM | POA: Diagnosis not present

## 2023-08-27 DIAGNOSIS — M25662 Stiffness of left knee, not elsewhere classified: Secondary | ICD-10-CM | POA: Diagnosis not present

## 2023-08-27 DIAGNOSIS — M6281 Muscle weakness (generalized): Secondary | ICD-10-CM | POA: Diagnosis not present

## 2023-08-27 DIAGNOSIS — R262 Difficulty in walking, not elsewhere classified: Secondary | ICD-10-CM | POA: Diagnosis not present

## 2023-08-29 DIAGNOSIS — M6281 Muscle weakness (generalized): Secondary | ICD-10-CM | POA: Diagnosis not present

## 2023-08-29 DIAGNOSIS — R262 Difficulty in walking, not elsewhere classified: Secondary | ICD-10-CM | POA: Diagnosis not present

## 2023-08-29 DIAGNOSIS — M1712 Unilateral primary osteoarthritis, left knee: Secondary | ICD-10-CM | POA: Diagnosis not present

## 2023-08-29 DIAGNOSIS — M25662 Stiffness of left knee, not elsewhere classified: Secondary | ICD-10-CM | POA: Diagnosis not present

## 2023-09-04 DIAGNOSIS — H2513 Age-related nuclear cataract, bilateral: Secondary | ICD-10-CM | POA: Diagnosis not present

## 2023-09-04 DIAGNOSIS — R262 Difficulty in walking, not elsewhere classified: Secondary | ICD-10-CM | POA: Diagnosis not present

## 2023-09-04 DIAGNOSIS — H25013 Cortical age-related cataract, bilateral: Secondary | ICD-10-CM | POA: Diagnosis not present

## 2023-09-04 DIAGNOSIS — H18413 Arcus senilis, bilateral: Secondary | ICD-10-CM | POA: Diagnosis not present

## 2023-09-04 DIAGNOSIS — M25662 Stiffness of left knee, not elsewhere classified: Secondary | ICD-10-CM | POA: Diagnosis not present

## 2023-09-04 DIAGNOSIS — M6281 Muscle weakness (generalized): Secondary | ICD-10-CM | POA: Diagnosis not present

## 2023-09-04 DIAGNOSIS — M1712 Unilateral primary osteoarthritis, left knee: Secondary | ICD-10-CM | POA: Diagnosis not present

## 2023-09-04 DIAGNOSIS — H25043 Posterior subcapsular polar age-related cataract, bilateral: Secondary | ICD-10-CM | POA: Diagnosis not present

## 2023-09-05 ENCOUNTER — Ambulatory Visit (INDEPENDENT_AMBULATORY_CARE_PROVIDER_SITE_OTHER): Payer: Medicare HMO | Admitting: Family Medicine

## 2023-09-05 DIAGNOSIS — M1712 Unilateral primary osteoarthritis, left knee: Secondary | ICD-10-CM | POA: Diagnosis not present

## 2023-09-06 DIAGNOSIS — R262 Difficulty in walking, not elsewhere classified: Secondary | ICD-10-CM | POA: Diagnosis not present

## 2023-09-06 DIAGNOSIS — M1712 Unilateral primary osteoarthritis, left knee: Secondary | ICD-10-CM | POA: Diagnosis not present

## 2023-09-06 DIAGNOSIS — M6281 Muscle weakness (generalized): Secondary | ICD-10-CM | POA: Diagnosis not present

## 2023-09-06 DIAGNOSIS — M25662 Stiffness of left knee, not elsewhere classified: Secondary | ICD-10-CM | POA: Diagnosis not present

## 2023-09-11 ENCOUNTER — Ambulatory Visit (INDEPENDENT_AMBULATORY_CARE_PROVIDER_SITE_OTHER): Payer: Medicare HMO | Admitting: Family Medicine

## 2023-09-11 ENCOUNTER — Encounter (INDEPENDENT_AMBULATORY_CARE_PROVIDER_SITE_OTHER): Payer: Self-pay | Admitting: Family Medicine

## 2023-09-11 VITALS — BP 104/71 | HR 104 | Temp 97.8°F | Ht 66.0 in | Wt 224.0 lb

## 2023-09-11 DIAGNOSIS — E669 Obesity, unspecified: Secondary | ICD-10-CM | POA: Diagnosis not present

## 2023-09-11 DIAGNOSIS — M25662 Stiffness of left knee, not elsewhere classified: Secondary | ICD-10-CM | POA: Diagnosis not present

## 2023-09-11 DIAGNOSIS — Z6836 Body mass index (BMI) 36.0-36.9, adult: Secondary | ICD-10-CM | POA: Diagnosis not present

## 2023-09-11 DIAGNOSIS — M6281 Muscle weakness (generalized): Secondary | ICD-10-CM | POA: Diagnosis not present

## 2023-09-11 DIAGNOSIS — R7303 Prediabetes: Secondary | ICD-10-CM | POA: Diagnosis not present

## 2023-09-11 DIAGNOSIS — F5089 Other specified eating disorder: Secondary | ICD-10-CM

## 2023-09-11 DIAGNOSIS — F3289 Other specified depressive episodes: Secondary | ICD-10-CM

## 2023-09-11 DIAGNOSIS — I10 Essential (primary) hypertension: Secondary | ICD-10-CM

## 2023-09-11 DIAGNOSIS — M1712 Unilateral primary osteoarthritis, left knee: Secondary | ICD-10-CM | POA: Diagnosis not present

## 2023-09-11 DIAGNOSIS — R262 Difficulty in walking, not elsewhere classified: Secondary | ICD-10-CM | POA: Diagnosis not present

## 2023-09-11 MED ORDER — BUPROPION HCL ER (SR) 200 MG PO TB12: 200.0000 mg | ORAL_TABLET | Freq: Every day | ORAL | 0 refills | Status: DC

## 2023-09-11 MED ORDER — METFORMIN HCL 500 MG PO TABS: 500.0000 mg | ORAL_TABLET | Freq: Two times a day (BID) | ORAL | 0 refills | Status: DC

## 2023-09-11 MED ORDER — LOSARTAN POTASSIUM-HCTZ 100-12.5 MG PO TABS: 1.0000 | ORAL_TABLET | Freq: Every day | ORAL | 0 refills | Status: DC

## 2023-09-11 MED ORDER — VENLAFAXINE HCL 75 MG PO TABS: 75.0000 mg | ORAL_TABLET | Freq: Every day | ORAL | 0 refills | Status: DC

## 2023-09-11 NOTE — Progress Notes (Signed)
.smr  Office: [phone]  /  Fax: [phone]  WEIGHT SUMMARY AND BIOMETRICS  Anthropometric Measurements Height: 5\' 6"  (1.676 m) Weight: 224 lb (101.6 kg) BMI (Calculated): 36.17 Weight at Last Visit: 230 lb Weight Lost Since Last Visit: 6 lb Weight Gained Since Last Visit: 0 Starting Weight: 281 lb Total Weight Loss (lbs): 57 lb (25.9 kg)   Body Composition  Body Fat %: 48.6 % Fat Mass (lbs): 109 lbs Muscle Mass (lbs): 109.4 lbs Total Body Water (lbs): 78.6 lbs Visceral Fat Rating : 15   Other Clinical Data Fasting: no Labs: no Today's Visit #: 70 Starting Date: 02/06/18    Chief Complaint: OBESITY    History of Present Illness   The patient presents to discuss obesity, prediabetes, hypertension, and emotional eating behaviors.  She has lost six pounds since her last visit six weeks ago, despite the holiday period. She aims for a daily intake of 1300-1400 calories with at least 80 grams of protein. She is not currently exercising but is trying to increase her activity level through physical therapy following knee surgery.  She is on metformin 500 mg twice daily for prediabetes and is working on reducing simple carbohydrates in her diet. Her dietary changes are ongoing.  She is taking Wellbutrin SR 200 mg daily and Effexor 75 mg daily for emotional eating behaviors. She is working on Orthoptist and feels successful about 20% of the time. She requests refills for all her medications.  Her blood pressure is well controlled at 104/71 mmHg on losartan hydrochlorothiazide 100/12.5 mg daily. She feels a little lightheaded since her knee surgery, which she attributes to difficulty sleeping and not being able to get comfortable.  She underwent knee surgery and is currently in recovery, attending physical therapy. She reports a fall prior to surgery, resulting in a chest injury that caused soreness and difficulty taking deep breaths. An X-ray showed no fractures, only  muscle bruising. She experiences fatigue and low energy, which she attributes to the recovery process and lack of sleep.          PHYSICAL EXAM:  Blood pressure 104/71, pulse (!) 104, temperature 97.8 F (36.6 C), height 5\' 6"  (1.676 m), weight 224 lb (101.6 kg), SpO2 96%. Body mass index is 36.15 kg/m.  DIAGNOSTIC DATA REVIEWED:  BMET    Component Value Date/Time   NA 140 05/08/2023 0910   K 4.9 05/08/2023 0910   CL 102 05/08/2023 0910   CO2 23 05/08/2023 0910   GLUCOSE 98 05/08/2023 0910   GLUCOSE 93 08/28/2018 1333   BUN 25 05/08/2023 0910   CREATININE 0.92 05/08/2023 0910   CREATININE 0.81 06/08/2016 1353   CALCIUM 9.4 05/08/2023 0910   GFRNONAA 92 06/10/2020 0953   GFRAA 106 06/10/2020 0953   Lab Results  Component Value Date   HGBA1C 5.3 05/08/2023   HGBA1C 5.8 (H) 02/06/2018   Lab Results  Component Value Date   INSULIN 9.3 05/08/2023   INSULIN 8.7 02/06/2018   Lab Results  Component Value Date   TSH 3.680 11/16/2022   CBC    Component Value Date/Time   WBC 4.7 04/13/2022 1110   WBC 5.0 06/08/2016 1353   RBC 4.98 04/13/2022 1110   RBC 4.94 06/08/2016 1353   HGB 14.2 04/13/2022 1110   HGB 14.3 04/02/2014 0937   HCT 43.6 04/13/2022 1110   PLT 237 04/13/2022 1110   MCV 88 04/13/2022 1110   MCH 28.5 04/13/2022 1110   MCH 27.5 06/08/2016 1353  MCHC 32.6 04/13/2022 1110   MCHC 31.2 (L) 06/08/2016 1353   RDW 13.4 04/13/2022 1110   Iron Studies No results found for: "IRON", "TIBC", "FERRITIN", "IRONPCTSAT" Lipid Panel     Component Value Date/Time   CHOL 240 (H) 05/08/2023 0910   TRIG 98 05/08/2023 0910   HDL 76 05/08/2023 0910   CHOLHDL 3.1 09/27/2017 1404   CHOLHDL 3.4 06/08/2016 1353   VLDL 19 06/08/2016 1353   LDLCALC 147 (H) 05/08/2023 0910   Hepatic Function Panel     Component Value Date/Time   PROT 6.9 05/08/2023 0910   ALBUMIN 4.2 05/08/2023 0910   AST 17 05/08/2023 0910   ALT 16 05/08/2023 0910   ALKPHOS 96 05/08/2023 0910    BILITOT 0.4 05/08/2023 0910      Component Value Date/Time   TSH 3.680 11/16/2022 0926   Nutritional Lab Results  Component Value Date   VD25OH 62.9 05/08/2023   VD25OH 50.4 11/16/2022   VD25OH 38.1 04/13/2022     Assessment and Plan    Obesity   Patient has lost 6 pounds since the last visit, consuming 1300-1400 calories daily with at least 80 grams of protein. She is not currently exercising but is engaging in physical therapy post-knee surgery. Discussed the importance of protein intake and provided soup recipes to help meet protein goals.   - Continue current dietary plan with focus on protein intake.   - Encourage journaling of food intake.   - Provide packet of soup recipes.    Emotional Eating Behaviors   Patient is on Wellbutrin SR 200 mg daily and Effexor 75 mg daily. She reports some success with journaling about 20% of the time. Discussed the importance of continued journaling to manage emotional eating behaviors.   - Continue Wellbutrin SR 200 mg daily.   - Continue Effexor 75 mg daily.   - Encourage continued journaling.    Prediabetes   Patient is on metformin 500 mg BID and is working on reducing simple carbohydrates in her diet. Discussed the importance of dietary modifications to manage blood glucose levels.   - Continue metformin 500 mg BID.   - Continue dietary modifications to reduce simple carbohydrates.    Hypertension   Blood pressure is well controlled at 104/71 mmHg on losartan hydrochlorothiazide 100/12.5 mg daily. She reports occasional lightheadedness, which may be related to hydration status or medication. Discussed the importance of adequate hydration and monitoring blood pressure at home. Consider potential medication adjustment if lightheadedness persists despite adequate hydration.   - Continue losartan hydrochlorothiazide 100/12.5 mg daily.   - Encourage adequate hydration.   - Monitor blood pressure at home once or twice a week.   - Consider  medication adjustment if lightheadedness persists despite adequate hydration.    Post-Knee Surgery Recovery   Patient is recovering well from knee surgery with ongoing physical therapy. She reports some residual numbness and soreness but is progressing. She also experienced a fall before surgery, resulting in bruised ribs, which are still healing. Discussed the importance of physical therapy as primary exercise and ensuring adequate rest and hydration.   - Continue physical therapy.   - Use physical therapy as primary exercise for now.   - Ensure adequate rest and hydration.    General Health Maintenance     Follow-up   - Schedule follow-up appointment for March.          She was informed of the importance of frequent follow up visits to maximize her success with  intensive lifestyle modifications for her multiple health conditions.    Quillian Quince, MD

## 2023-09-13 ENCOUNTER — Telehealth (INDEPENDENT_AMBULATORY_CARE_PROVIDER_SITE_OTHER): Payer: Self-pay | Admitting: Family Medicine

## 2023-09-13 DIAGNOSIS — H903 Sensorineural hearing loss, bilateral: Secondary | ICD-10-CM | POA: Diagnosis not present

## 2023-09-13 DIAGNOSIS — I10 Essential (primary) hypertension: Secondary | ICD-10-CM

## 2023-09-13 DIAGNOSIS — F3289 Other specified depressive episodes: Secondary | ICD-10-CM

## 2023-09-13 DIAGNOSIS — R7303 Prediabetes: Secondary | ICD-10-CM

## 2023-09-13 NOTE — Telephone Encounter (Signed)
Patient called in to say the scripts for her Metformin, Losartan, Wellbutrin and Venlafaxin were sent to the wrong pharmacy. They were sent to [facility] but the patient needs them sent to [facility].

## 2023-09-17 DIAGNOSIS — R262 Difficulty in walking, not elsewhere classified: Secondary | ICD-10-CM | POA: Diagnosis not present

## 2023-09-17 DIAGNOSIS — M1712 Unilateral primary osteoarthritis, left knee: Secondary | ICD-10-CM | POA: Diagnosis not present

## 2023-09-17 DIAGNOSIS — M25662 Stiffness of left knee, not elsewhere classified: Secondary | ICD-10-CM | POA: Diagnosis not present

## 2023-09-17 DIAGNOSIS — M6281 Muscle weakness (generalized): Secondary | ICD-10-CM | POA: Diagnosis not present

## 2023-09-18 ENCOUNTER — Other Ambulatory Visit (INDEPENDENT_AMBULATORY_CARE_PROVIDER_SITE_OTHER): Payer: Self-pay | Admitting: Family Medicine

## 2023-09-18 DIAGNOSIS — F3289 Other specified depressive episodes: Secondary | ICD-10-CM

## 2023-09-18 DIAGNOSIS — R7303 Prediabetes: Secondary | ICD-10-CM

## 2023-09-18 DIAGNOSIS — I10 Essential (primary) hypertension: Secondary | ICD-10-CM

## 2023-09-18 MED ORDER — LOSARTAN POTASSIUM-HCTZ 100-12.5 MG PO TABS: 1.0000 | ORAL_TABLET | Freq: Every day | ORAL | 0 refills | Status: DC

## 2023-09-18 MED ORDER — METFORMIN HCL 500 MG PO TABS: 500.0000 mg | ORAL_TABLET | Freq: Two times a day (BID) | ORAL | 0 refills | Status: DC

## 2023-09-18 MED ORDER — VENLAFAXINE HCL 75 MG PO TABS: 75.0000 mg | ORAL_TABLET | Freq: Every day | ORAL | 0 refills | Status: DC

## 2023-09-18 MED ORDER — BUPROPION HCL ER (SR) 200 MG PO TB12: 200.0000 mg | ORAL_TABLET | Freq: Every day | ORAL | 0 refills | Status: DC

## 2023-09-18 NOTE — Telephone Encounter (Signed)
Resent to [facility].

## 2023-09-24 DIAGNOSIS — H52209 Unspecified astigmatism, unspecified eye: Secondary | ICD-10-CM | POA: Diagnosis not present

## 2023-09-24 DIAGNOSIS — H2511 Age-related nuclear cataract, right eye: Secondary | ICD-10-CM | POA: Diagnosis not present

## 2023-09-25 DIAGNOSIS — M6281 Muscle weakness (generalized): Secondary | ICD-10-CM | POA: Diagnosis not present

## 2023-09-25 DIAGNOSIS — M1712 Unilateral primary osteoarthritis, left knee: Secondary | ICD-10-CM | POA: Diagnosis not present

## 2023-09-25 DIAGNOSIS — H2512 Age-related nuclear cataract, left eye: Secondary | ICD-10-CM | POA: Diagnosis not present

## 2023-09-25 DIAGNOSIS — R262 Difficulty in walking, not elsewhere classified: Secondary | ICD-10-CM | POA: Diagnosis not present

## 2023-09-25 DIAGNOSIS — M25662 Stiffness of left knee, not elsewhere classified: Secondary | ICD-10-CM | POA: Diagnosis not present

## 2023-10-02 DIAGNOSIS — R262 Difficulty in walking, not elsewhere classified: Secondary | ICD-10-CM | POA: Diagnosis not present

## 2023-10-02 DIAGNOSIS — M25662 Stiffness of left knee, not elsewhere classified: Secondary | ICD-10-CM | POA: Diagnosis not present

## 2023-10-02 DIAGNOSIS — M6281 Muscle weakness (generalized): Secondary | ICD-10-CM | POA: Diagnosis not present

## 2023-10-02 DIAGNOSIS — M1712 Unilateral primary osteoarthritis, left knee: Secondary | ICD-10-CM | POA: Diagnosis not present

## 2023-10-04 DIAGNOSIS — M1711 Unilateral primary osteoarthritis, right knee: Secondary | ICD-10-CM | POA: Diagnosis not present

## 2023-10-05 DIAGNOSIS — Z9889 Other specified postprocedural states: Secondary | ICD-10-CM | POA: Diagnosis not present

## 2023-10-05 DIAGNOSIS — H31091 Other chorioretinal scars, right eye: Secondary | ICD-10-CM | POA: Diagnosis not present

## 2023-10-08 DIAGNOSIS — H52209 Unspecified astigmatism, unspecified eye: Secondary | ICD-10-CM | POA: Diagnosis not present

## 2023-10-08 DIAGNOSIS — H2512 Age-related nuclear cataract, left eye: Secondary | ICD-10-CM | POA: Diagnosis not present

## 2023-10-09 ENCOUNTER — Encounter (INDEPENDENT_AMBULATORY_CARE_PROVIDER_SITE_OTHER): Payer: Self-pay | Admitting: Family Medicine

## 2023-10-09 ENCOUNTER — Ambulatory Visit (INDEPENDENT_AMBULATORY_CARE_PROVIDER_SITE_OTHER): Payer: Medicare HMO | Admitting: Family Medicine

## 2023-10-09 VITALS — BP 135/83 | HR 79 | Temp 98.1°F | Ht 66.0 in | Wt 224.0 lb

## 2023-10-09 DIAGNOSIS — Z6836 Body mass index (BMI) 36.0-36.9, adult: Secondary | ICD-10-CM

## 2023-10-09 DIAGNOSIS — R7303 Prediabetes: Secondary | ICD-10-CM | POA: Diagnosis not present

## 2023-10-09 DIAGNOSIS — I10 Essential (primary) hypertension: Secondary | ICD-10-CM | POA: Diagnosis not present

## 2023-10-09 DIAGNOSIS — E669 Obesity, unspecified: Secondary | ICD-10-CM | POA: Diagnosis not present

## 2023-10-09 NOTE — Progress Notes (Signed)
 .smr  Office: [phone]  /  Fax: [phone]  WEIGHT SUMMARY AND BIOMETRICS  Anthropometric Measurements Height: 5\' 6"  (1.676 m) Weight: 224 lb (101.6 kg) BMI (Calculated): 36.17 Weight at Last Visit: 224 lb Weight Lost Since Last Visit: 0 Weight Gained Since Last Visit: 0 Starting Weight: 281 lb Total Weight Loss (lbs): 57 lb (25.9 kg) Peak Weight: 280 lb   Body Composition  Body Fat %: 49.7 % Fat Mass (lbs): 111.6 lbs Muscle Mass (lbs): 107.2 lbs Total Body Water (lbs): 79.4 lbs Visceral Fat Rating : 15   Other Clinical Data Fasting: yes Labs: no Today's Visit #: 57 Starting Date: 02/06/18    Chief Complaint: OBESITY    History of Present Illness   Cheryl Ottey "[name]" is a 68 year old female with obesity and hypertension who presents for a follow-up on her weight management and blood pressure control.  She has maintained her weight since her last visit one month ago, journaling her food intake about 25% of the time. Her calorie goal is between 1300 to 1400 calories per day, with a protein intake of 80 grams or more daily. She experiences emotional eating due to stress but has managed to maintain her weight.  Her blood pressure today was initially 142/84 mmHg, improving to 135/83 mmHg upon repeat measurement. She is taking losartan hydrochlorothiazide and is working on diet, exercise, and weight loss to manage her blood pressure. She engages in physical therapy for exercise, focusing on core strengthening to improve balance, attending sessions twice a week for 60 minutes each.  She underwent knee replacement surgery on January 9th and has been using a cane intermittently due to a recent knee tweak, attributed to increased activity while caring for her parents. She received a cortisone shot in her right knee, which has provided relief. She plans to continue physical therapy, with eight sessions remaining, and is working on balance exercises both at home and in  therapy.  She is currently taking metformin without any gastrointestinal issues and is also on vitamin D and Wellbutrin. She inquired about healthy snack options to manage stress eating, particularly seeking low-calorie, high-protein choices.          PHYSICAL EXAM:  Blood pressure 135/83, pulse 79, temperature 98.1 F (36.7 C), height 5\' 6"  (1.676 m), weight 224 lb (101.6 kg), SpO2 100%. Body mass index is 36.15 kg/m.  DIAGNOSTIC DATA REVIEWED:  BMET    Component Value Date/Time   NA 140 05/08/2023 0910   K 4.9 05/08/2023 0910   CL 102 05/08/2023 0910   CO2 23 05/08/2023 0910   GLUCOSE 98 05/08/2023 0910   GLUCOSE 93 08/28/2018 1333   BUN 25 05/08/2023 0910   CREATININE 0.92 05/08/2023 0910   CREATININE 0.81 06/08/2016 1353   CALCIUM 9.4 05/08/2023 0910   GFRNONAA 92 06/10/2020 0953   GFRAA 106 06/10/2020 0953   Lab Results  Component Value Date   HGBA1C 5.3 05/08/2023   HGBA1C 5.8 (H) 02/06/2018   Lab Results  Component Value Date   INSULIN 9.3 05/08/2023   INSULIN 8.7 02/06/2018   Lab Results  Component Value Date   TSH 3.680 11/16/2022   CBC    Component Value Date/Time   WBC 4.7 04/13/2022 1110   WBC 5.0 06/08/2016 1353   RBC 4.98 04/13/2022 1110   RBC 4.94 06/08/2016 1353   HGB 14.2 04/13/2022 1110   HGB 14.3 04/02/2014 0937   HCT 43.6 04/13/2022 1110   PLT 237 04/13/2022 1110  MCV 88 04/13/2022 1110   MCH 28.5 04/13/2022 1110   MCH 27.5 06/08/2016 1353   MCHC 32.6 04/13/2022 1110   MCHC 31.2 (L) 06/08/2016 1353   RDW 13.4 04/13/2022 1110   Iron Studies No results found for: "IRON", "TIBC", "FERRITIN", "IRONPCTSAT" Lipid Panel     Component Value Date/Time   CHOL 240 (H) 05/08/2023 0910   TRIG 98 05/08/2023 0910   HDL 76 05/08/2023 0910   CHOLHDL 3.1 09/27/2017 1404   CHOLHDL 3.4 06/08/2016 1353   VLDL 19 06/08/2016 1353   LDLCALC 147 (H) 05/08/2023 0910   Hepatic Function Panel     Component Value Date/Time   PROT 6.9 05/08/2023  0910   ALBUMIN 4.2 05/08/2023 0910   AST 17 05/08/2023 0910   ALT 16 05/08/2023 0910   ALKPHOS 96 05/08/2023 0910   BILITOT 0.4 05/08/2023 0910      Component Value Date/Time   TSH 3.680 11/16/2022 0926   Nutritional Lab Results  Component Value Date   VD25OH 62.9 05/08/2023   VD25OH 50.4 11/16/2022   VD25OH 38.1 04/13/2022     Assessment and Plan    Post-Knee Replacement Rehabilitation Knee replacement on January 9th. Experiencing discomfort and balance issues. Using cane intermittently. Received cortisone shot in right knee due to overuse. Undergoing physical therapy twice a week for 60 minutes, focusing on core strengthening and balance. Missed some appointments due to family responsibilities. - Continue physical therapy sessions (8 remaining) and continue on home exercises as well - Reschedule missed physical therapy appointments  Hypertension Initial blood pressure elevated at 142/84, improved to 135/83 upon repeat measurement. On losartan hydrochlorothiazide. Working on diet, exercise, and weight loss to manage blood pressure. - Continue losartan hydrochlorothiazide - Continue monitoring blood pressure - Encourage adherence to diet and exercise regimen  Obesity Maintained weight since last visit one month ago. Journaling diet intermittently (25% of the time). Calorie goal: 1300-1400 calories/day. Protein goal: 80 grams/day. Experiencing stress and emotional eating due to family responsibilities. Discussed strategies for managing stress eating, including healthier snack options like cheese chips and sugar-free pudding with [employer] milk. - Continue current dietary goals (1300-1400 calories/day, 80 grams of protein/day) - Provide list of 100-calorie snack options with protein content - Encourage healthier snack options to manage stress eating  Prediabetes Inquired about timing of metformin intake relative to meals. No gastrointestinal issues with metformin. Advised that  metformin can be taken with or without food but recommended taking it with a small snack or glass of milk to avoid potential GI upset. - Advise that metformin can be taken with or without food, but recommend taking it with a small snack or glass of milk to avoid potential GI upset  General Health Maintenance Currently taking vitamin D and Wellbutrin. Managing multiple family responsibilities, including caring for mother who recently had pneumonia and experienced an allergic reaction to antibiotics. Discussed mother's symptoms and improvement after stopping antibiotics. - Continue vitamin D and Wellbutrin  Follow-up - Schedule follow-up appointment in April - Contact clinic if any issues arise before next appointment.         I have personally spent 30 minutes total time today in preparation, patient care, and documentation for this visit, including the following: review of clinical lab tests; review of medical tests/procedures/services.    She was informed of the importance of frequent follow up visits to maximize her success with intensive lifestyle modifications for her multiple health conditions.    Quillian Quince, MD

## 2023-10-17 ENCOUNTER — Telehealth: Payer: Self-pay

## 2023-10-17 NOTE — Telephone Encounter (Signed)
 Called pt for surgical clearance appt left vm for her to call and make a appt

## 2023-10-22 NOTE — Telephone Encounter (Signed)
 Called pt and she will schedule her clearance when she comes in may.

## 2023-10-24 DIAGNOSIS — Z1231 Encounter for screening mammogram for malignant neoplasm of breast: Secondary | ICD-10-CM | POA: Diagnosis not present

## 2023-10-24 LAB — HM MAMMOGRAPHY

## 2023-11-07 ENCOUNTER — Ambulatory Visit (INDEPENDENT_AMBULATORY_CARE_PROVIDER_SITE_OTHER): Payer: Medicare HMO | Admitting: Family Medicine

## 2023-11-07 ENCOUNTER — Encounter (INDEPENDENT_AMBULATORY_CARE_PROVIDER_SITE_OTHER): Payer: Self-pay | Admitting: Family Medicine

## 2023-11-07 VITALS — BP 135/90 | HR 67 | Temp 98.0°F | Ht 66.0 in | Wt 223.0 lb

## 2023-11-07 DIAGNOSIS — Z6836 Body mass index (BMI) 36.0-36.9, adult: Secondary | ICD-10-CM

## 2023-11-07 DIAGNOSIS — Z0000 Encounter for general adult medical examination without abnormal findings: Secondary | ICD-10-CM

## 2023-11-07 DIAGNOSIS — E669 Obesity, unspecified: Secondary | ICD-10-CM

## 2023-11-07 DIAGNOSIS — I10 Essential (primary) hypertension: Secondary | ICD-10-CM

## 2023-11-07 DIAGNOSIS — Z6835 Body mass index (BMI) 35.0-35.9, adult: Secondary | ICD-10-CM

## 2023-11-07 NOTE — Progress Notes (Signed)
 Office: [phone]  /  Fax: [phone]  WEIGHT SUMMARY AND BIOMETRICS  Anthropometric Measurements Height: 5\' 6"  (1.676 m) Weight: 223 lb (101.2 kg) BMI (Calculated): 36.01 Weight at Last Visit: 224 lb Weight Lost Since Last Visit: 1 lb Weight Gained Since Last Visit: 0 Starting Weight: 281 lb Total Weight Loss (lbs): 58 lb (26.3 kg)   Body Composition  Body Fat %: 48 % Fat Mass (lbs): 107.4 lbs Muscle Mass (lbs): 110.4 lbs Total Body Water (lbs): 76.8 lbs Visceral Fat Rating : 15   Other Clinical Data Today's Visit #: 27 Starting Date: 02/06/18    Chief Complaint: OBESITY    History of Present Illness   Cheryl Koch "[name]" is a 68 year old female with obesity and hypertension who presents for obesity treatment plan assessment and progress evaluation.  She has been maintaining a journal with a calorie goal of 1300 to 1400 calories and a protein goal of 80 or more grams, achieving this goal about 35% of the time. She is increasing her physical activity by walking and gardening, doing at least 20 minutes twice a week. She has lost one pound in the last month, totaling 58 pounds lost since her initial visit.  She has a history of hypertension. She is currently taking losartan and hydrochlorothiazide in the morning. Her blood pressure today was elevated at 137/94, with a repeat measurement of 135/90.  She is splitting her time between her house in [place] and her mother's house in [place].          PHYSICAL EXAM:  Blood pressure (!) 135/90, pulse 67, temperature 98 F (36.7 C), height 5\' 6"  (1.676 m), weight 223 lb (101.2 kg), SpO2 98%. Body mass index is 35.99 kg/m.  DIAGNOSTIC DATA REVIEWED:  BMET    Component Value Date/Time   NA 140 05/08/2023 0910   K 4.9 05/08/2023 0910   CL 102 05/08/2023 0910   CO2 23 05/08/2023 0910   GLUCOSE 98 05/08/2023 0910   GLUCOSE 93 08/28/2018 1333   BUN 25 05/08/2023 0910   CREATININE 0.92 05/08/2023  0910   CREATININE 0.81 06/08/2016 1353   CALCIUM 9.4 05/08/2023 0910   GFRNONAA 92 06/10/2020 0953   GFRAA 106 06/10/2020 0953   Lab Results  Component Value Date   HGBA1C 5.3 05/08/2023   HGBA1C 5.8 (H) 02/06/2018   Lab Results  Component Value Date   INSULIN 9.3 05/08/2023   INSULIN 8.7 02/06/2018   Lab Results  Component Value Date   TSH 3.680 11/16/2022   CBC    Component Value Date/Time   WBC 4.7 04/13/2022 1110   WBC 5.0 06/08/2016 1353   RBC 4.98 04/13/2022 1110   RBC 4.94 06/08/2016 1353   HGB 14.2 04/13/2022 1110   HGB 14.3 04/02/2014 0937   HCT 43.6 04/13/2022 1110   PLT 237 04/13/2022 1110   MCV 88 04/13/2022 1110   MCH 28.5 04/13/2022 1110   MCH 27.5 06/08/2016 1353   MCHC 32.6 04/13/2022 1110   MCHC 31.2 (L) 06/08/2016 1353   RDW 13.4 04/13/2022 1110   Iron Studies No results found for: "IRON", "TIBC", "FERRITIN", "IRONPCTSAT" Lipid Panel     Component Value Date/Time   CHOL 240 (H) 05/08/2023 0910   TRIG 98 05/08/2023 0910   HDL 76 05/08/2023 0910   CHOLHDL 3.1 09/27/2017 1404   CHOLHDL 3.4 06/08/2016 1353   VLDL 19 06/08/2016 1353   LDLCALC 147 (H) 05/08/2023 0910   Hepatic Function Panel  Component Value Date/Time   PROT 6.9 05/08/2023 0910   ALBUMIN 4.2 05/08/2023 0910   AST 17 05/08/2023 0910   ALT 16 05/08/2023 0910   ALKPHOS 96 05/08/2023 0910   BILITOT 0.4 05/08/2023 0910      Component Value Date/Time   TSH 3.680 11/16/2022 0926   Nutritional Lab Results  Component Value Date   VD25OH 62.9 05/08/2023   VD25OH 50.4 11/16/2022   VD25OH 38.1 04/13/2022     Assessment and Plan    Hypertension Hypertension is present, managed with losartan and hydrochlorothiazide. Blood pressure readings today were elevated at 137/94 and 135/90. Antihypertensive medications may not last a full 24 hours in all individuals, potentially affecting morning readings. - Monitor blood pressure at home, especially in the morning before taking  medication and a couple of hours after - Continue current antihypertensive medications (losartan and hydrochlorothiazide) -Continue to work on diet exercise and weight loss to improve BP further.  Obesity She is actively pursuing weight loss, having lost 58 pounds since her first visit. She maintains a calorie goal of 1300-1400 and a protein goal of 80 or more grams, achieving this about 35% of the time. Physical activity includes walking and gardening for at least 20 minutes twice a week. She has lost 1 pound in the last month. Discussed high-protein, low-calorie dessert recipes to aid in dietary adherence, including a cottage cheese-based pudding with 125 calories and 13 grams of protein per serving. - Continue current dietary and exercise regimen - Encourage journaling to track calorie and protein intake - Incorporate high-protein, low-calorie dessert recipes  General Health Maintenance She is engaging in lifestyle modifications to improve overall health, including increased physical activity and dietary changes. Gardening is considered as a form of exercise and stress relief, with plans for container gardening and possibly raised beds for fresh produce. - Encourage continued physical activity and gardening - Support plans for container gardening and raised beds for fresh produce  Follow-up Next appointment is scheduled. No immediate need for medication refills was identified. - Schedule next follow-up appointment - Ensure medication refills are up to date         I have personally spent 30 minutes total time today in preparation, patient care, and documentation for this visit, including the following: review of clinical lab tests; review of medical tests/procedures/services.    She was informed of the importance of frequent follow up visits to maximize her success with intensive lifestyle modifications for her multiple health conditions.    Quillian Quince, MD

## 2023-11-19 DIAGNOSIS — M25512 Pain in left shoulder: Secondary | ICD-10-CM | POA: Diagnosis not present

## 2023-11-22 ENCOUNTER — Emergency Department (HOSPITAL_COMMUNITY)

## 2023-11-22 ENCOUNTER — Inpatient Hospital Stay (HOSPITAL_COMMUNITY)
Admission: EM | Admit: 2023-11-22 | Discharge: 2023-11-28 | DRG: 481 | Disposition: A | Discharge status: Skilled Nursing Facility | Attending: Orthopedic Surgery | Admitting: Orthopedic Surgery

## 2023-11-22 ENCOUNTER — Inpatient Hospital Stay (HOSPITAL_COMMUNITY)

## 2023-11-22 ENCOUNTER — Other Ambulatory Visit: Payer: Self-pay

## 2023-11-22 ENCOUNTER — Encounter (HOSPITAL_COMMUNITY): Payer: Self-pay | Admitting: Emergency Medicine

## 2023-11-22 DIAGNOSIS — M79652 Pain in left thigh: Secondary | ICD-10-CM | POA: Diagnosis not present

## 2023-11-22 DIAGNOSIS — G4730 Sleep apnea, unspecified: Secondary | ICD-10-CM | POA: Diagnosis present

## 2023-11-22 DIAGNOSIS — E785 Hyperlipidemia, unspecified: Secondary | ICD-10-CM | POA: Diagnosis present

## 2023-11-22 DIAGNOSIS — F32A Depression, unspecified: Secondary | ICD-10-CM | POA: Diagnosis not present

## 2023-11-22 DIAGNOSIS — E861 Hypovolemia: Secondary | ICD-10-CM | POA: Diagnosis not present

## 2023-11-22 DIAGNOSIS — M25462 Effusion, left knee: Secondary | ICD-10-CM | POA: Diagnosis not present

## 2023-11-22 DIAGNOSIS — S7291XS Unspecified fracture of right femur, sequela: Secondary | ICD-10-CM

## 2023-11-22 DIAGNOSIS — R7303 Prediabetes: Secondary | ICD-10-CM | POA: Diagnosis present

## 2023-11-22 DIAGNOSIS — S72451A Displaced supracondylar fracture without intracondylar extension of lower end of right femur, initial encounter for closed fracture: Secondary | ICD-10-CM | POA: Diagnosis not present

## 2023-11-22 DIAGNOSIS — M8580 Other specified disorders of bone density and structure, unspecified site: Secondary | ICD-10-CM | POA: Diagnosis present

## 2023-11-22 DIAGNOSIS — Z90710 Acquired absence of both cervix and uterus: Secondary | ICD-10-CM | POA: Diagnosis not present

## 2023-11-22 DIAGNOSIS — M6281 Muscle weakness (generalized): Secondary | ICD-10-CM | POA: Diagnosis not present

## 2023-11-22 DIAGNOSIS — K219 Gastro-esophageal reflux disease without esophagitis: Secondary | ICD-10-CM | POA: Diagnosis not present

## 2023-11-22 DIAGNOSIS — R2681 Unsteadiness on feet: Secondary | ICD-10-CM | POA: Diagnosis not present

## 2023-11-22 DIAGNOSIS — I10 Essential (primary) hypertension: Secondary | ICD-10-CM | POA: Diagnosis present

## 2023-11-22 DIAGNOSIS — M979XXA Periprosthetic fracture around unspecified internal prosthetic joint, initial encounter: Secondary | ICD-10-CM

## 2023-11-22 DIAGNOSIS — N179 Acute kidney failure, unspecified: Secondary | ICD-10-CM | POA: Diagnosis not present

## 2023-11-22 DIAGNOSIS — N281 Cyst of kidney, acquired: Secondary | ICD-10-CM | POA: Diagnosis not present

## 2023-11-22 DIAGNOSIS — Z6835 Body mass index (BMI) 35.0-35.9, adult: Secondary | ICD-10-CM | POA: Diagnosis not present

## 2023-11-22 DIAGNOSIS — Z8249 Family history of ischemic heart disease and other diseases of the circulatory system: Secondary | ICD-10-CM

## 2023-11-22 DIAGNOSIS — W109XXA Fall (on) (from) unspecified stairs and steps, initial encounter: Secondary | ICD-10-CM | POA: Diagnosis present

## 2023-11-22 DIAGNOSIS — M19012 Primary osteoarthritis, left shoulder: Secondary | ICD-10-CM | POA: Diagnosis not present

## 2023-11-22 DIAGNOSIS — Z933 Colostomy status: Secondary | ICD-10-CM

## 2023-11-22 DIAGNOSIS — Z043 Encounter for examination and observation following other accident: Secondary | ICD-10-CM | POA: Diagnosis not present

## 2023-11-22 DIAGNOSIS — I959 Hypotension, unspecified: Secondary | ICD-10-CM | POA: Diagnosis not present

## 2023-11-22 DIAGNOSIS — S7291XD Unspecified fracture of right femur, subsequent encounter for closed fracture with routine healing: Secondary | ICD-10-CM | POA: Diagnosis not present

## 2023-11-22 DIAGNOSIS — Z9049 Acquired absence of other specified parts of digestive tract: Secondary | ICD-10-CM | POA: Diagnosis not present

## 2023-11-22 DIAGNOSIS — E669 Obesity, unspecified: Secondary | ICD-10-CM | POA: Diagnosis present

## 2023-11-22 DIAGNOSIS — Z96652 Presence of left artificial knee joint: Secondary | ICD-10-CM | POA: Diagnosis present

## 2023-11-22 DIAGNOSIS — S7292XA Unspecified fracture of left femur, initial encounter for closed fracture: Secondary | ICD-10-CM | POA: Diagnosis not present

## 2023-11-22 DIAGNOSIS — S72002A Fracture of unspecified part of neck of left femur, initial encounter for closed fracture: Secondary | ICD-10-CM | POA: Diagnosis not present

## 2023-11-22 DIAGNOSIS — S72402A Unspecified fracture of lower end of left femur, initial encounter for closed fracture: Secondary | ICD-10-CM | POA: Diagnosis not present

## 2023-11-22 DIAGNOSIS — M25562 Pain in left knee: Secondary | ICD-10-CM | POA: Diagnosis not present

## 2023-11-22 DIAGNOSIS — Z818 Family history of other mental and behavioral disorders: Secondary | ICD-10-CM

## 2023-11-22 DIAGNOSIS — Z888 Allergy status to other drugs, medicaments and biological substances status: Secondary | ICD-10-CM

## 2023-11-22 DIAGNOSIS — Z79899 Other long term (current) drug therapy: Secondary | ICD-10-CM

## 2023-11-22 DIAGNOSIS — R918 Other nonspecific abnormal finding of lung field: Secondary | ICD-10-CM | POA: Diagnosis not present

## 2023-11-22 DIAGNOSIS — E119 Type 2 diabetes mellitus without complications: Secondary | ICD-10-CM | POA: Diagnosis not present

## 2023-11-22 DIAGNOSIS — R278 Other lack of coordination: Secondary | ICD-10-CM | POA: Diagnosis not present

## 2023-11-22 DIAGNOSIS — M25552 Pain in left hip: Secondary | ICD-10-CM | POA: Diagnosis not present

## 2023-11-22 DIAGNOSIS — Z87891 Personal history of nicotine dependence: Secondary | ICD-10-CM | POA: Diagnosis not present

## 2023-11-22 DIAGNOSIS — E860 Dehydration: Secondary | ICD-10-CM | POA: Diagnosis present

## 2023-11-22 DIAGNOSIS — Z7984 Long term (current) use of oral hypoglycemic drugs: Secondary | ICD-10-CM | POA: Diagnosis not present

## 2023-11-22 DIAGNOSIS — M9712XA Periprosthetic fracture around internal prosthetic left knee joint, initial encounter: Secondary | ICD-10-CM | POA: Diagnosis not present

## 2023-11-22 DIAGNOSIS — R0989 Other specified symptoms and signs involving the circulatory and respiratory systems: Secondary | ICD-10-CM | POA: Diagnosis not present

## 2023-11-22 DIAGNOSIS — M25512 Pain in left shoulder: Secondary | ICD-10-CM | POA: Diagnosis not present

## 2023-11-22 DIAGNOSIS — D62 Acute posthemorrhagic anemia: Secondary | ICD-10-CM | POA: Diagnosis not present

## 2023-11-22 DIAGNOSIS — Z7982 Long term (current) use of aspirin: Secondary | ICD-10-CM | POA: Diagnosis not present

## 2023-11-22 LAB — CBC WITH DIFFERENTIAL/PLATELET
Abs Immature Granulocytes: 0.07 10*3/uL (ref 0.00–0.07)
Basophils Absolute: 0.1 10*3/uL (ref 0.0–0.1)
Basophils Relative: 1 %
Eosinophils Absolute: 0 10*3/uL (ref 0.0–0.5)
Eosinophils Relative: 0 %
HCT: 41.6 % (ref 36.0–46.0)
Hemoglobin: 13.5 g/dL (ref 12.0–15.0)
Immature Granulocytes: 0 %
Lymphocytes Relative: 9 %
Lymphs Abs: 1.5 10*3/uL (ref 0.7–4.0)
MCH: 28.2 pg (ref 26.0–34.0)
MCHC: 32.5 g/dL (ref 30.0–36.0)
MCV: 87 fL (ref 80.0–100.0)
Monocytes Absolute: 1.3 10*3/uL — ABNORMAL HIGH (ref 0.1–1.0)
Monocytes Relative: 8 %
Neutro Abs: 14 10*3/uL — ABNORMAL HIGH (ref 1.7–7.7)
Neutrophils Relative %: 82 %
Platelets: 402 10*3/uL — ABNORMAL HIGH (ref 150–400)
RBC: 4.78 MIL/uL (ref 3.87–5.11)
RDW: 14.1 % (ref 11.5–15.5)
WBC: 16.9 10*3/uL — ABNORMAL HIGH (ref 4.0–10.5)
nRBC: 0 % (ref 0.0–0.2)

## 2023-11-22 LAB — BASIC METABOLIC PANEL WITH GFR
Anion gap: 11 (ref 5–15)
BUN: 29 mg/dL — ABNORMAL HIGH (ref 8–23)
CO2: 24 mmol/L (ref 22–32)
Calcium: 8.8 mg/dL — ABNORMAL LOW (ref 8.9–10.3)
Chloride: 101 mmol/L (ref 98–111)
Creatinine, Ser: 1.36 mg/dL — ABNORMAL HIGH (ref 0.44–1.00)
GFR, Estimated: 43 mL/min — ABNORMAL LOW (ref >60)
Glucose, Bld: 124 mg/dL — ABNORMAL HIGH (ref 70–99)
Potassium: 3.6 mmol/L (ref 3.5–5.1)
Sodium: 136 mmol/L (ref 135–145)

## 2023-11-22 LAB — I-STAT CHEM 8, ED
BUN: 31 mg/dL — ABNORMAL HIGH (ref 8–23)
Calcium, Ion: 1 mmol/L — ABNORMAL LOW (ref 1.15–1.40)
Chloride: 105 mmol/L (ref 98–111)
Creatinine, Ser: 1.6 mg/dL — ABNORMAL HIGH (ref 0.44–1.00)
Glucose, Bld: 149 mg/dL — ABNORMAL HIGH (ref 70–99)
HCT: 42 % (ref 36.0–46.0)
Hemoglobin: 14.3 g/dL (ref 12.0–15.0)
Potassium: 4.2 mmol/L (ref 3.5–5.1)
Sodium: 136 mmol/L (ref 135–145)
TCO2: 18 mmol/L — ABNORMAL LOW (ref 22–32)

## 2023-11-22 LAB — PROTIME-INR
INR: 1.1 (ref 0.8–1.2)
Prothrombin Time: 14.3 s (ref 11.4–15.2)

## 2023-11-22 LAB — HIV ANTIBODY (ROUTINE TESTING W REFLEX): HIV Screen 4th Generation wRfx: NONREACTIVE

## 2023-11-22 LAB — APTT: aPTT: 26 s (ref 24–36)

## 2023-11-22 MED ORDER — METHOCARBAMOL 1000 MG/10ML IJ SOLN: 500.0000 mg | Freq: Four times a day (QID) | INTRAMUSCULAR | Status: DC | PRN

## 2023-11-22 MED ORDER — FENTANYL CITRATE PF 50 MCG/ML IJ SOSY
50.0000 ug | PREFILLED_SYRINGE | Freq: Once | INTRAMUSCULAR | Status: AC
  Administered 2023-11-22: 50 ug via INTRAVENOUS
  Filled 2023-11-22: qty 1

## 2023-11-22 MED ORDER — LOSARTAN POTASSIUM 50 MG PO TABS
100.0000 mg | ORAL_TABLET | Freq: Every day | ORAL | Status: DC
Start: 2023-11-23 — End: 2023-11-26
  Administered 2023-11-24 – 2023-11-26 (×3): 100 mg via ORAL
  Filled 2023-11-22 (×3): qty 2

## 2023-11-22 MED ORDER — POVIDONE-IODINE 10 % EX SWAB
2.0000 | Freq: Once | CUTANEOUS | Status: AC
  Administered 2023-11-23: 2 via TOPICAL

## 2023-11-22 MED ORDER — VENLAFAXINE HCL 75 MG PO TABS
75.0000 mg | ORAL_TABLET | Freq: Every day | ORAL | Status: DC
  Administered 2023-11-23 – 2023-11-28 (×6): 75 mg via ORAL
  Filled 2023-11-22 (×6): qty 1

## 2023-11-22 MED ORDER — LORAZEPAM 2 MG/ML IJ SOLN
1.0000 mg | Freq: Once | INTRAMUSCULAR | Status: AC
  Administered 2023-11-22: 1 mg via INTRAVENOUS
  Filled 2023-11-22: qty 1

## 2023-11-22 MED ORDER — FENTANYL CITRATE PF 50 MCG/ML IJ SOSY
100.0000 ug | PREFILLED_SYRINGE | Freq: Once | INTRAMUSCULAR | Status: AC
  Administered 2023-11-22: 50 ug via INTRAVENOUS
  Filled 2023-11-22: qty 2

## 2023-11-22 MED ORDER — TRANEXAMIC ACID-NACL 1000-0.7 MG/100ML-% IV SOLN
1000.0000 mg | INTRAVENOUS | Status: AC
  Administered 2023-11-23: 1000 mg via INTRAVENOUS
  Filled 2023-11-22: qty 100

## 2023-11-22 MED ORDER — LOSARTAN POTASSIUM-HCTZ 100-12.5 MG PO TABS: 1.0000 | ORAL_TABLET | Freq: Every day | ORAL | Status: DC

## 2023-11-22 MED ORDER — BUPROPION HCL ER (SR) 100 MG PO TB12
200.0000 mg | ORAL_TABLET | Freq: Every day | ORAL | Status: DC
  Administered 2023-11-23 – 2023-11-28 (×6): 200 mg via ORAL
  Filled 2023-11-22 (×6): qty 2

## 2023-11-22 MED ORDER — VITAMIN D 25 MCG (1000 UNIT) PO TABS
1000.0000 [IU] | ORAL_TABLET | Freq: Every day | ORAL | Status: DC
  Administered 2023-11-23 – 2023-11-28 (×6): 1000 [IU] via ORAL
  Filled 2023-11-22 (×6): qty 1

## 2023-11-22 MED ORDER — METFORMIN HCL 500 MG PO TABS
500.0000 mg | ORAL_TABLET | Freq: Two times a day (BID) | ORAL | Status: DC
Start: 2023-11-23 — End: 2023-11-28
  Administered 2023-11-23 – 2023-11-28 (×11): 500 mg via ORAL
  Filled 2023-11-22 (×11): qty 1

## 2023-11-22 MED ORDER — ONDANSETRON HCL 4 MG/2ML IJ SOLN
4.0000 mg | Freq: Once | INTRAMUSCULAR | Status: AC
  Administered 2023-11-22: 4 mg via INTRAVENOUS

## 2023-11-22 MED ORDER — OXYCODONE HCL 5 MG PO TABS
5.0000 mg | ORAL_TABLET | ORAL | Status: DC | PRN
  Administered 2023-11-22 – 2023-11-23 (×2): 10 mg via ORAL
  Filled 2023-11-22 (×2): qty 2

## 2023-11-22 MED ORDER — LACTATED RINGERS IV BOLUS
1000.0000 mL | Freq: Once | INTRAVENOUS | Status: AC
  Administered 2023-11-22: 1000 mL via INTRAVENOUS

## 2023-11-22 MED ORDER — MORPHINE SULFATE (PF) 2 MG/ML IV SOLN
0.5000 mg | INTRAVENOUS | Status: DC | PRN
  Administered 2023-11-22 – 2023-11-23 (×2): 0.5 mg via INTRAVENOUS
  Filled 2023-11-22 (×2): qty 1

## 2023-11-22 MED ORDER — CHLORHEXIDINE GLUCONATE 4 % EX SOLN
60.0000 mL | Freq: Once | CUTANEOUS | Status: AC
  Administered 2023-11-23: 4 via TOPICAL
  Filled 2023-11-22: qty 15

## 2023-11-22 MED ORDER — METHOCARBAMOL 500 MG PO TABS
500.0000 mg | ORAL_TABLET | Freq: Four times a day (QID) | ORAL | Status: DC | PRN
  Administered 2023-11-22 – 2023-11-23 (×2): 500 mg via ORAL
  Filled 2023-11-22 (×2): qty 1

## 2023-11-22 MED ORDER — VITAMIN D3 25 MCG (1000 UT) PO CAPS
1.0000 | ORAL_CAPSULE | Freq: Every day | ORAL | Status: DC
Start: 2023-11-23 — End: 2023-11-22

## 2023-11-22 MED ORDER — MORPHINE SULFATE (PF) 4 MG/ML IV SOLN
4.0000 mg | Freq: Once | INTRAVENOUS | Status: AC
  Administered 2023-11-22: 4 mg via INTRAVENOUS
  Filled 2023-11-22: qty 1

## 2023-11-22 MED ORDER — HYDROCHLOROTHIAZIDE 12.5 MG PO TABS
12.5000 mg | ORAL_TABLET | Freq: Every day | ORAL | Status: DC
  Administered 2023-11-24: 12.5 mg via ORAL
  Filled 2023-11-22 (×2): qty 1

## 2023-11-22 MED ORDER — CEFAZOLIN SODIUM-DEXTROSE 2-4 GM/100ML-% IV SOLN
2.0000 g | INTRAVENOUS | Status: AC
  Administered 2023-11-23: 2 g via INTRAVENOUS
  Filled 2023-11-22: qty 100

## 2023-11-22 MED ORDER — ACETAMINOPHEN 325 MG PO TABS: 650.0000 mg | ORAL_TABLET | Freq: Four times a day (QID) | ORAL | Status: DC | PRN

## 2023-11-22 MED ORDER — POLYETHYLENE GLYCOL 3350 17 G PO PACK: 17.0000 g | PACK | Freq: Every day | ORAL | Status: DC | PRN

## 2023-11-22 NOTE — H&P (Signed)
 ORTHOPAEDIC H&P   Chief Complaint: left femur fracture  HPI: Cheryl [name] is a 68 y.o. female who had L TKA w/ Dr. Eulah Pont in early January. She had been doing very well since surgery. Unfortunately today she had a fall coming down stairs injuring her left knee. She denies pain in other joints or extremities. She denies distal n/t.  Past Medical History:  Diagnosis Date   Allergy    Arthritis    Chest pain    Depression    Fatigue    GERD (gastroesophageal reflux disease)    Hyperlipidemia    Hypertension    Joint pain    Obesity    Osteopenia    Palpitation    Sleep apnea    uses CPAP    SOB (shortness of breath)    Urinary incontinence    Past Surgical History:  Procedure Laterality Date   ABDOMINAL HYSTERECTOMY  08/10/2000   TAH/RSO   BREAST SURGERY     Biopsy-Benign   COLOSTOMY  2000   Dr.Patterson   KNEE ARTHROSCOPY Bilateral    LAPAROSCOPIC CHOLECYSTECTOMY  2010   LAPAROTOMY  1990   Fibroid removed-ovarian cyst    RETINAL DETACHMENT SURGERY Right    It was torn   RHINOPLASTY     Social History   Socioeconomic History   Marital status: Married    Spouse name: Brielle Moro   Number of children: 2   Years of education: 16   Highest education level: Bachelor's degree (e.g., BA, AB, BS)  Occupational History   Occupation: Lawyer  Tobacco Use   Smoking status: Former    Current packs/day: 0.00    Average packs/day: 0.5 packs/day for 10.0 years (5.0 ttl pk-yrs)    Types: Cigarettes    Quit date: 08/15/2007    Years since quitting: 16.2   Smokeless tobacco: Never  Vaping Use   Vaping status: Never Used  Substance and Sexual Activity   Alcohol use: Yes    Alcohol/week: 5.0 standard drinks of alcohol    Types: 2 Glasses of wine, 3 Standard drinks or equivalent per week    Comment: 3 drinks per week per pt    Drug use: No   Sexual activity: Not Currently    Birth control/protection: Post-menopausal, Surgical    Comment:  hysterectomy  Other Topics Concern   Not on file  Social History Narrative   2 adopted children, Eric (17) and Buyer, retail (18)   Social Drivers of Corporate investment banker Strain: Low Risk  (06/26/2023)   Overall Financial Resource Strain (CARDIA)    Difficulty of Paying Living Expenses: Not hard at all  Food Insecurity: No Food Insecurity (06/26/2023)   Hunger Vital Sign    Worried About Running Out of Food in the Last Year: Never true    Ran Out of Food in the Last Year: Never true  Transportation Needs: No Transportation Needs (06/26/2023)   PRAPARE - Administrator, Civil Service (Medical): No    Lack of Transportation (Non-Medical): No  Physical Activity: Insufficiently Active (06/26/2023)   Exercise Vital Sign    Days of Exercise per Week: 2 days    Minutes of Exercise per Session: 20 min  Stress: No Stress Concern Present (06/26/2023)   [employer] of Occupational Health - Occupational Stress Questionnaire    Feeling of Stress : Not at all  Recent Concern: Stress - Stress Concern Present (05/20/2023)   [employer] of Occupational Health -  Occupational Stress Questionnaire    Feeling of Stress : To some extent  Social Connections: Socially Integrated (06/26/2023)   Social Connection and Isolation Panel [NHANES]    Frequency of Communication with Friends and Family: More than three times a week    Frequency of Social Gatherings with Friends and Family: Three times a week    Attends Religious Services: More than 4 times per year    Active Member of Clubs or Organizations: Yes    Attends Engineer, structural: More than 4 times per year    Marital Status: Married   Family History  Problem Relation Age of Onset   Breast cancer Mother        Age 27   Hypertension Mother    Depression Mother    Arthritis Mother    Cancer Mother    COPD Mother    Hypertension Father    Arthritis Father    Heart disease Father    Cancer Father        Spleen    Sleep apnea Father    Obesity Paternal Aunt    Obesity Paternal Uncle    Hearing loss Maternal Grandmother    Heart disease Maternal Grandfather    Colon cancer Paternal Grandfather        Colon cancer   Stomach cancer Neg Hx    Colon polyps Neg Hx    Rectal cancer Neg Hx    Esophageal cancer Neg Hx    Allergies  Allergen Reactions   Ace Inhibitors Cough   Lisinopril Cough     Positive ROS: All other systems have been reviewed and were otherwise negative with the exception of those mentioned in the HPI and as above.  Physical Exam: General: Alert, no acute distress Cardiovascular: No pedal edema Respiratory: No cyanosis, no use of accessory musculature Skin: No lesions in the area of chief complaint Neurologic: Sensation intact distally Psychiatric: Patient is competent for consent with normal mood and affect  MUSCULOSKELETAL:  LLE Well healed anterior knee incision  Tender about the distal femur  No ankle effusion  Sens DPN, SPN, TN intact  Motor EHL, ext, flex 5/5  DP 2+, PT 2+, No significant edema   IMAGING: femur fracture shows displaced distal femur fracture  Assessment: Principal Problem:   Closed fracture of right femur, unspecified fracture morphology, unspecified portion of femur, sequela   Left displaced periprosthetic distal femur fracture  Plan: Unstable fracture with severe displacement. Will reduce and immobilize with bucks traction. NV intact distally. Will plan for OR in the AM.  The risks benefits and alternatives were discussed with the patient including but not limited to the risks of nonoperative treatment, versus surgical intervention including infection, bleeding, nerve injury, malunion, nonunion, the need for revision surgery, hardware prominence, hardware failure, the need for hardware removal, blood clots, cardiopulmonary complications, morbidity, mortality, among others, and they were willing to proceed.   Joen Laura,  MD  Contact information:   [email or web address] 7am-5pm epic message Dr. Blanchie Dessert, or call office for patient follow up: [phone] After hours and holidays please check [email or web address] for group call information for [facility]

## 2023-11-22 NOTE — ED Notes (Signed)
 Bucks traction placed by ortho technician and ortho MD.   Instructed to give of Fentanyl by ED provided until tolerate procedure.   Total dose of given.

## 2023-11-22 NOTE — ED Notes (Signed)
 Patient transported to X-ray

## 2023-11-22 NOTE — ED Notes (Signed)
 Attempted to give report to 5N. Alerted staff that pt had been sent up by ED charge RN request. Call back with any questions.

## 2023-11-22 NOTE — ED Triage Notes (Signed)
 Pt reports left total knee replacement in Jan. Pt fell today when coming down the steps. Pain to left shoulder, hip and knee pain.

## 2023-11-22 NOTE — ED Provider Notes (Signed)
 [facility] AT [facility] Provider Note   CSN: [id number] Arrival date & time: 11/22/23  1404     History Chief Complaint  Patient presents with   Fall   Knee Pain    Cheryl [name] is a 68 y.o. female with history of hypertension, sleep apnea presents emerged from today for evaluation after fall.  Patient having pain to her left knee.  She reports that she was going down 1 step and fell landing on her left knee and outstretched arms.  She denies hitting her head or neck or chest or abdomen.  She is currently having some pain to her left knee.  She reports that she is having some achiness to her left shoulder but does not have any pain to her hands or wrist or lower arms.  Denies any blood thinner use.  She reports that her knee was recently replaced by Dr. Eulah Pont on January 9 of this year.  Reporting some tingling to the lower leg. She reports she had coffee and breathmints at 1000, NPO since.    Fall Pertinent negatives include no chest pain, no abdominal pain, no headaches and no shortness of breath.  Knee Pain      Home Medications Prior to Admission medications   Medication Sig Start Date End Date Taking? Authorizing Provider  acetaminophen (TYLENOL) 650 MG CR tablet Take 650 mg by mouth daily at 6 (six) AM.    [provider]  aspirin 81 MG chewable tablet Chew 81 mg by mouth daily.    [provider]  buPROPion (WELLBUTRIN SR) 200 MG 12 hr tablet Take 1 tablet (200 mg total) by mouth daily. 09/18/23   Quillian Quince D, MD  Cholecalciferol (VITAMIN D3) 25 MCG (1000 UT) CAPS Take 1 capsule (1,000 Units total) by mouth daily. 07/11/22   Quillian Quince D, MD  docusate sodium (COLACE) 100 MG capsule Take 100 mg by mouth daily.    [provider]  Loratadine (CLARITIN PO) Take 1 tablet by mouth daily.     [provider]  losartan-hydrochlorothiazide (HYZAAR) 100-12.5 MG tablet Take 1 tablet by mouth daily.  09/18/23   Quillian Quince D, MD  metFORMIN (GLUCOPHAGE) 500 MG tablet Take 1 tablet (500 mg total) by mouth 2 (two) times daily with a meal. 09/18/23   Beasley, Caren D, MD  UNABLE TO FIND C PAP for sleep apnea     [provider]  venlafaxine (EFFEXOR) 75 MG tablet Take 1 tablet (75 mg total) by mouth daily. 09/18/23   Quillian Quince D, MD      Allergies    Ace inhibitors and Lisinopril    Review of Systems   Review of Systems  Respiratory:  Negative for shortness of breath.   Cardiovascular:  Negative for chest pain.  Gastrointestinal:  Negative for abdominal pain.  Musculoskeletal:  Positive for arthralgias.  Neurological:  Negative for headaches.    Physical Exam Updated Vital Signs BP 93/75 (BP Location: Right Arm)   Pulse 71   Temp (!) 96.5 F (35.8 C) (Axillary) Comment: drinking ice water  Resp 18   Ht 5\' 6"  (1.676 m)   Wt 101 kg   SpO2 100%   BMI 35.94 kg/m  Physical Exam Vitals and nursing note reviewed.  Constitutional:      Comments: Uncomfortable, in pain  HENT:     Mouth/Throat:     Mouth: Mucous membranes are dry.  Eyes:     General: No  scleral icterus. Cardiovascular:     Rate and Rhythm: Normal rate.  Pulmonary:     Effort: Pulmonary effort is normal. No respiratory distress.  Abdominal:     Palpations: Abdomen is soft.     Tenderness: There is no abdominal tenderness.  Musculoskeletal:     Comments: Palpable DP and PT pulses bilaterally.  Compartments are soft.  She does have some swelling to the distal left femur and left knee with questionable deformity.  No skin breaks, lacerations, or abrasions seen.  Patient does report tingling down the left leg however reports sensations intact bilaterally with light and deep touch.  No color or temperature changes visualized or palpated.  She has no tenderness to her upper extremities.  No tenderness into the wrist, hands, or upper arms.  No step-offs tenderness bilaterally.  Palpable radial pulses.   Skin:    General: Skin is warm and dry.  Neurological:     Mental Status: She is alert.     ED Results / Procedures / Treatments   Labs (all labs ordered are listed, but only abnormal results are displayed) Labs Reviewed  I-STAT CHEM 8, ED - Abnormal; Notable for the following components:      Result Value   BUN 31 (*)    Creatinine, Ser 1.60 (*)    Glucose, Bld 149 (*)    Calcium, Ion 1.00 (*)    TCO2 18 (*)    All other components within normal limits  CBC WITH DIFFERENTIAL/PLATELET    EKG None  Radiology DG Chest 1 View Result Date: 11/22/2023 CLINICAL DATA:  Fall, left femur fracture. EXAM: CHEST  1 VIEW COMPARISON:  Radiograph 09/17/2011 FINDINGS: Low lung volumes.The cardiomediastinal contours are normal for technique. Question of right apical opacity, not well assessed. Pulmonary vasculature is normal. No pleural effusion or pneumothorax. No acute osseous abnormalities are seen. IMPRESSION: Low lung volumes. Question of right apical opacity versus artifact. Recommend PA and lateral views when patient is able. Electronically Signed   By: Narda Rutherford M.D.   On: 11/22/2023 17:30   DG FEMUR MIN 2 VIEWS LEFT Result Date: 11/22/2023 CLINICAL DATA:  Pain after fall. EXAM: PELVIS - 1-2 VIEW; LEFT FEMUR 2 VIEWS COMPARISON:  None Available. FINDINGS: Pelvis: The cortical margins of the bony pelvis are intact. No fracture. Pubic symphysis and sacroiliac joints are congruent. Both femoral heads are well-seated in the respective acetabula. Femur: Comminuted and moderately displaced periprosthetic distal femur fracture adjacent to the femoral component of knee arthroplasty. Distal fragment is displaced 1 shaft width laterally with osseous overriding of greater than 4 cm. The tibiofemoral components remain congruent. Generalized soft tissue IMPRESSION: 1. Comminuted and moderately displaced periprosthetic distal femur fracture adjacent to the femoral component of knee arthroplasty. 2. No  proximal femur or pelvic fracture. Electronically Signed   By: Narda Rutherford M.D.   On: 11/22/2023 17:29   DG Pelvis 1-2 Views Result Date: 11/22/2023 CLINICAL DATA:  Pain after fall. EXAM: PELVIS - 1-2 VIEW; LEFT FEMUR 2 VIEWS COMPARISON:  None Available. FINDINGS: Pelvis: The cortical margins of the bony pelvis are intact. No fracture. Pubic symphysis and sacroiliac joints are congruent. Both femoral heads are well-seated in the respective acetabula. Femur: Comminuted and moderately displaced periprosthetic distal femur fracture adjacent to the femoral component of knee arthroplasty. Distal fragment is displaced 1 shaft width laterally with osseous overriding of greater than 4 cm. The tibiofemoral components remain congruent. Generalized soft tissue IMPRESSION: 1. Comminuted and moderately displaced periprosthetic distal  femur fracture adjacent to the femoral component of knee arthroplasty. 2. No proximal femur or pelvic fracture. Electronically Signed   By: Narda Rutherford M.D.   On: 11/22/2023 17:29   DG Shoulder Left Result Date: 11/22/2023 CLINICAL DATA:  Pain after fall. EXAM: LEFT SHOULDER - 2+ VIEW COMPARISON:  None Available. FINDINGS: There is no evidence of fracture or dislocation. Mild acromioclavicular and glenohumeral degenerative change. Soft tissues are unremarkable. IMPRESSION: 1. No fracture or dislocation of the left shoulder. 2. Mild degenerative change. Electronically Signed   By: Narda Rutherford M.D.   On: 11/22/2023 17:28    Procedures Procedures   Medications Ordered in ED Medications  LORazepam (ATIVAN) injection 1 mg (has no administration in time range)  fentaNYL (SUBLIMAZE) injection 50 mcg (50 mcg Intravenous Given 11/22/23 1724)  lactated ringers bolus 1,000 mL (1,000 mLs Intravenous New Bag/Given 11/22/23 1727)  ondansetron (ZOFRAN) injection 4 mg (4 mg Intravenous Given 11/22/23 1729)  morphine (PF) 4 MG/ML injection 4 mg (4 mg Intravenous Given 11/22/23 1753)     ED Course/ Medical Decision Making/ A&P Clinical Course as of 11/23/23 0129  Thu Nov 22, 2023  1745 Try 15 pounds of Buchs traction and re-check an XR. Will need to be admitted. If her exam improves, can have surgery tomorrow. If does not improve, will need surgery tonight.  [RR]  1902 Palpable pulses symmetric distally still. Compartments are soft, however there is still swelling. Patient is resting comfortably on the bed. [RR]    Clinical Course User Index [RR] Achille Rich, PA-C   Medical Decision Making Amount and/or Complexity of Data Reviewed Labs: ordered. Radiology: ordered.  Risk Prescription drug management. Decision regarding hospitalization.   68 y.o. female presents to the ER for evaluation of FOOSH. Differential diagnosis includes but is not limited to trauma, fracture, dislocation, sprain, strain. Vital signs temperature 96.5, otherwise unremarkable. Physical exam as noted above.   Workup was initiated in triage.  On my evaluation, patient is very tearful, appears to be in significant amount of pain.  She does have her knee mildly abducted in mildly flexed position at the knee. Given her pain, her pants were cut off her for better visualization with permission from the patient. Fentanyl and some Zofran was administered. I reviewed the femur imaging and see a distal femur/knee fracture.   Dr. Read Drivers with [facility] was consulted. He does not recommend CT or CTA imaging for now. Recommends 15 pounds Buchs traction and re-XR. If she has improvement of her symptoms, will plan for surgery tomorrow.  If no improvement of her symptoms, will plan for surgery tonight.  She is given additional dose of morphine and Ativan to help with anxiety and muscle relaxant.  I independently reviewed and interpreted the patient's labs.  CBC does show leukocytosis however of like this is a stress reaction.  I-STAT Chem-8 shows a creatinine of 1.60.  It appears higher than her  baseline, she does have a AKI as well. Fluids ordered.   6:27 PM The patient is currently in a hallway bed, waiting for room and hospital bed for Buchs traction.   Dr. Read Drivers at bedside with [facility] for Buchs traction. Patient was quickly moved upstairs and ortho is the admitting team.   Portions of this report may have been transcribed using voice recognition software. Every effort was made to ensure accuracy; however, inadvertent computerized transcription errors may be present.   Final Clinical Impression(s) / ED Diagnoses Final diagnoses:  AKI (acute kidney injury) (  HCC)  Periprosthetic fracture of knee    Rx / DC Orders ED Discharge Orders     None         Achille Rich, [name] Poche 11/23/23 0132    Jacalyn Lefevre, MD 11/23/23 [phone]

## 2023-11-22 NOTE — Progress Notes (Signed)
 Orthopedic Tech Progress Note Patient Details:  Cheryl [name] 1956-01-10 [id number]  Musculoskeletal Traction Type of Traction: Bucks Skin Traction Traction Location: LUE Traction Weight: 15 lbs   Post Interventions Patient Tolerated: Well Instructions Provided: Care of device  [name] 11/22/2023, 8:03 PM

## 2023-11-22 NOTE — Plan of Care (Signed)

## 2023-11-23 ENCOUNTER — Inpatient Hospital Stay (HOSPITAL_COMMUNITY): Admitting: Anesthesiology

## 2023-11-23 ENCOUNTER — Other Ambulatory Visit: Payer: Self-pay

## 2023-11-23 ENCOUNTER — Inpatient Hospital Stay (HOSPITAL_COMMUNITY)

## 2023-11-23 ENCOUNTER — Encounter (HOSPITAL_COMMUNITY): Payer: Self-pay | Admitting: Orthopedic Surgery

## 2023-11-23 ENCOUNTER — Encounter (HOSPITAL_COMMUNITY)
Admission: EM | Disposition: A | Discharge status: Skilled Nursing Facility | Payer: Self-pay | Source: Home / Self Care | Attending: Orthopedic Surgery

## 2023-11-23 DIAGNOSIS — S72402A Unspecified fracture of lower end of left femur, initial encounter for closed fracture: Secondary | ICD-10-CM

## 2023-11-23 HISTORY — PX: ORIF FEMUR FRACTURE: SHX2119

## 2023-11-23 LAB — CBC
HCT: 35.6 % — ABNORMAL LOW (ref 36.0–46.0)
HCT: 30 % — ABNORMAL LOW (ref 36.0–46.0)
Hemoglobin: 11.7 g/dL — ABNORMAL LOW (ref 12.0–15.0)
Hemoglobin: 9.8 g/dL — ABNORMAL LOW (ref 12.0–15.0)
MCH: 28.1 pg (ref 26.0–34.0)
MCH: 28.5 pg (ref 26.0–34.0)
MCHC: 32.9 g/dL (ref 30.0–36.0)
MCHC: 32.7 g/dL (ref 30.0–36.0)
MCV: 85.4 fL (ref 80.0–100.0)
MCV: 87.2 fL (ref 80.0–100.0)
Platelets: 339 10*3/uL (ref 150–400)
Platelets: 300 10*3/uL (ref 150–400)
RBC: 4.17 MIL/uL (ref 3.87–5.11)
RBC: 3.44 MIL/uL — ABNORMAL LOW (ref 3.87–5.11)
RDW: 14.5 % (ref 11.5–15.5)
RDW: 14.3 % (ref 11.5–15.5)
WBC: 12 10*3/uL — ABNORMAL HIGH (ref 4.0–10.5)
WBC: 13.2 10*3/uL — ABNORMAL HIGH (ref 4.0–10.5)
nRBC: 0 % (ref 0.0–0.2)
nRBC: 0 % (ref 0.0–0.2)

## 2023-11-23 LAB — BASIC METABOLIC PANEL WITH GFR
Anion gap: 10 (ref 5–15)
BUN: 32 mg/dL — ABNORMAL HIGH (ref 8–23)
CO2: 23 mmol/L (ref 22–32)
Calcium: 8.7 mg/dL — ABNORMAL LOW (ref 8.9–10.3)
Chloride: 103 mmol/L (ref 98–111)
Creatinine, Ser: 1.24 mg/dL — ABNORMAL HIGH (ref 0.44–1.00)
GFR, Estimated: 48 mL/min — ABNORMAL LOW (ref >60)
Glucose, Bld: 133 mg/dL — ABNORMAL HIGH (ref 70–99)
Potassium: 3.9 mmol/L (ref 3.5–5.1)
Sodium: 136 mmol/L (ref 135–145)

## 2023-11-23 LAB — SURGICAL PCR SCREEN
MRSA, PCR: NEGATIVE
Staphylococcus aureus: NEGATIVE

## 2023-11-23 SURGERY — OPEN REDUCTION INTERNAL FIXATION (ORIF) DISTAL FEMUR FRACTURE
Anesthesia: General | Laterality: Left

## 2023-11-23 MED ORDER — OXYCODONE HCL 5 MG PO TABS: 5.0000 mg | ORAL_TABLET | Freq: Once | ORAL | Status: DC | PRN

## 2023-11-23 MED ORDER — OXYCODONE HCL 5 MG PO TABS
10.0000 mg | ORAL_TABLET | ORAL | Status: DC | PRN
  Administered 2023-11-23 – 2023-11-28 (×8): 10 mg via ORAL
  Filled 2023-11-23 (×8): qty 2

## 2023-11-23 MED ORDER — CEFAZOLIN SODIUM-DEXTROSE 2-4 GM/100ML-% IV SOLN
2.0000 g | Freq: Four times a day (QID) | INTRAVENOUS | Status: AC
  Administered 2023-11-23 – 2023-11-24 (×2): 2 g via INTRAVENOUS
  Filled 2023-11-23 (×2): qty 100

## 2023-11-23 MED ORDER — ONDANSETRON HCL 4 MG/2ML IJ SOLN: 4.0000 mg | Freq: Four times a day (QID) | INTRAMUSCULAR | Status: DC | PRN

## 2023-11-23 MED ORDER — METOCLOPRAMIDE HCL 5 MG PO TABS: 5.0000 mg | ORAL_TABLET | Freq: Three times a day (TID) | ORAL | Status: DC | PRN

## 2023-11-23 MED ORDER — CHLORHEXIDINE GLUCONATE 0.12 % MT SOLN
OROMUCOSAL | Status: AC
  Administered 2023-11-23: 15 mL via OROMUCOSAL
  Filled 2023-11-23: qty 15

## 2023-11-23 MED ORDER — OXYCODONE HCL 5 MG PO TABS
5.0000 mg | ORAL_TABLET | ORAL | Status: DC | PRN
  Administered 2023-11-24 – 2023-11-25 (×4): 5 mg via ORAL
  Filled 2023-11-23 (×4): qty 1

## 2023-11-23 MED ORDER — HYDROMORPHONE HCL 1 MG/ML IJ SOLN
0.5000 mg | INTRAMUSCULAR | Status: DC | PRN
  Administered 2023-11-23: 1 mg via INTRAVENOUS
  Administered 2023-11-25: 0.5 mg via INTRAVENOUS
  Filled 2023-11-23 (×2): qty 1

## 2023-11-23 MED ORDER — PROPOFOL 10 MG/ML IV BOLUS
INTRAVENOUS | Status: DC | PRN
  Administered 2023-11-23: 200 mg via INTRAVENOUS

## 2023-11-23 MED ORDER — LIDOCAINE 2% (20 MG/ML) 5 ML SYRINGE
INTRAMUSCULAR | Status: DC | PRN
  Administered 2023-11-23: 100 mg via INTRAVENOUS

## 2023-11-23 MED ORDER — ENOXAPARIN SODIUM 30 MG/0.3ML IJ SOSY
30.0000 mg | PREFILLED_SYRINGE | INTRAMUSCULAR | Status: DC
  Administered 2023-11-24 – 2023-11-26 (×3): 30 mg via SUBCUTANEOUS
  Filled 2023-11-23 (×3): qty 0.3

## 2023-11-23 MED ORDER — PHENYLEPHRINE HCL-NACL 20-0.9 MG/250ML-% IV SOLN
INTRAVENOUS | Status: DC | PRN
  Administered 2023-11-23: 30 ug/min via INTRAVENOUS

## 2023-11-23 MED ORDER — METOCLOPRAMIDE HCL 5 MG/ML IJ SOLN: 5.0000 mg | Freq: Three times a day (TID) | INTRAMUSCULAR | Status: DC | PRN

## 2023-11-23 MED ORDER — PHENOL 1.4 % MT LIQD: 1.0000 | OROMUCOSAL | Status: DC | PRN

## 2023-11-23 MED ORDER — HYDROMORPHONE HCL 1 MG/ML IJ SOLN: 0.2500 mg | INTRAMUSCULAR | Status: DC | PRN

## 2023-11-23 MED ORDER — BISACODYL 10 MG RE SUPP
10.0000 mg | Freq: Every day | RECTAL | Status: DC | PRN
  Administered 2023-11-26: 10 mg via RECTAL
  Filled 2023-11-23: qty 1

## 2023-11-23 MED ORDER — ROCURONIUM BROMIDE 10 MG/ML (PF) SYRINGE
PREFILLED_SYRINGE | INTRAVENOUS | Status: AC
  Filled 2023-11-23: qty 10

## 2023-11-23 MED ORDER — OXYCODONE HCL 5 MG/5ML PO SOLN: 5.0000 mg | Freq: Once | ORAL | Status: DC | PRN

## 2023-11-23 MED ORDER — ONDANSETRON HCL 4 MG/2ML IJ SOLN
4.0000 mg | Freq: Four times a day (QID) | INTRAMUSCULAR | Status: DC | PRN
  Administered 2023-11-26: 4 mg via INTRAVENOUS
  Filled 2023-11-23: qty 2

## 2023-11-23 MED ORDER — ALUM & MAG HYDROXIDE-SIMETH 200-200-20 MG/5ML PO SUSP: 30.0000 mL | ORAL | Status: DC | PRN

## 2023-11-23 MED ORDER — ONDANSETRON HCL 4 MG/2ML IJ SOLN
INTRAMUSCULAR | Status: DC | PRN
Start: 2023-11-23 — End: 2023-11-23
  Administered 2023-11-23: 4 mg via INTRAVENOUS

## 2023-11-23 MED ORDER — FENTANYL CITRATE (PF) 250 MCG/5ML IJ SOLN
INTRAMUSCULAR | Status: AC
  Filled 2023-11-23: qty 5

## 2023-11-23 MED ORDER — TRANEXAMIC ACID-NACL 1000-0.7 MG/100ML-% IV SOLN
1000.0000 mg | Freq: Once | INTRAVENOUS | Status: AC
  Administered 2023-11-23: 1000 mg via INTRAVENOUS
  Filled 2023-11-23: qty 100

## 2023-11-23 MED ORDER — ALBUMIN HUMAN 5 % IV SOLN: INTRAVENOUS | Status: DC | PRN

## 2023-11-23 MED ORDER — CHLORHEXIDINE GLUCONATE 0.12 % MT SOLN: 15.0000 mL | Freq: Once | OROMUCOSAL | Status: AC

## 2023-11-23 MED ORDER — DOCUSATE SODIUM 100 MG PO CAPS
100.0000 mg | ORAL_CAPSULE | Freq: Two times a day (BID) | ORAL | Status: DC
  Administered 2023-11-23 – 2023-11-28 (×10): 100 mg via ORAL
  Filled 2023-11-23 (×10): qty 1

## 2023-11-23 MED ORDER — MIDAZOLAM HCL 2 MG/2ML IJ SOLN
INTRAMUSCULAR | Status: AC
  Filled 2023-11-23: qty 2

## 2023-11-23 MED ORDER — ROCURONIUM BROMIDE 10 MG/ML (PF) SYRINGE
PREFILLED_SYRINGE | INTRAVENOUS | Status: DC | PRN
  Administered 2023-11-23: 10 mg via INTRAVENOUS
  Administered 2023-11-23: 20 mg via INTRAVENOUS
  Administered 2023-11-23: 70 mg via INTRAVENOUS

## 2023-11-23 MED ORDER — 0.9 % SODIUM CHLORIDE (POUR BTL) OPTIME
TOPICAL | Status: DC | PRN
  Administered 2023-11-23: 1000 mL

## 2023-11-23 MED ORDER — PANTOPRAZOLE SODIUM 40 MG PO TBEC
40.0000 mg | DELAYED_RELEASE_TABLET | Freq: Every day | ORAL | Status: DC
  Administered 2023-11-23 – 2023-11-28 (×6): 40 mg via ORAL
  Filled 2023-11-23 (×6): qty 1

## 2023-11-23 MED ORDER — ACETAMINOPHEN 325 MG PO TABS: 325.0000 mg | ORAL_TABLET | Freq: Four times a day (QID) | ORAL | Status: DC | PRN

## 2023-11-23 MED ORDER — FENTANYL CITRATE (PF) 250 MCG/5ML IJ SOLN
INTRAMUSCULAR | Status: DC | PRN
Start: 2023-11-23 — End: 2023-11-23
  Administered 2023-11-23: 50 ug via INTRAVENOUS
  Administered 2023-11-23 (×2): 25 ug via INTRAVENOUS
  Administered 2023-11-23: 50 ug via INTRAVENOUS

## 2023-11-23 MED ORDER — ACETAMINOPHEN 500 MG PO TABS
1000.0000 mg | ORAL_TABLET | Freq: Four times a day (QID) | ORAL | Status: AC
  Administered 2023-11-23 – 2023-11-24 (×4): 1000 mg via ORAL
  Filled 2023-11-23 (×4): qty 2

## 2023-11-23 MED ORDER — LIDOCAINE 2% (20 MG/ML) 5 ML SYRINGE
INTRAMUSCULAR | Status: AC
  Filled 2023-11-23: qty 5

## 2023-11-23 MED ORDER — ONDANSETRON HCL 4 MG PO TABS: 4.0000 mg | ORAL_TABLET | Freq: Four times a day (QID) | ORAL | Status: DC | PRN

## 2023-11-23 MED ORDER — MENTHOL 3 MG MT LOZG: 1.0000 | LOZENGE | OROMUCOSAL | Status: DC | PRN

## 2023-11-23 MED ORDER — LACTATED RINGERS IV SOLN: INTRAVENOUS | Status: DC | PRN

## 2023-11-23 MED ORDER — METHOCARBAMOL 500 MG PO TABS
500.0000 mg | ORAL_TABLET | Freq: Four times a day (QID) | ORAL | Status: DC | PRN
  Administered 2023-11-23 – 2023-11-28 (×8): 500 mg via ORAL
  Filled 2023-11-23 (×9): qty 1

## 2023-11-23 MED ORDER — ORAL CARE MOUTH RINSE: 15.0000 mL | Freq: Once | OROMUCOSAL | Status: AC

## 2023-11-23 MED ORDER — BUPIVACAINE HCL (PF) 0.25 % IJ SOLN
INTRAMUSCULAR | Status: AC
  Filled 2023-11-23: qty 30

## 2023-11-23 MED ORDER — POLYETHYLENE GLYCOL 3350 17 G PO PACK
17.0000 g | PACK | Freq: Every day | ORAL | Status: DC | PRN
  Administered 2023-11-25 – 2023-11-26 (×2): 17 g via ORAL
  Filled 2023-11-23 (×2): qty 1

## 2023-11-23 MED ORDER — PHENYLEPHRINE 80 MCG/ML (10ML) SYRINGE FOR IV PUSH (FOR BLOOD PRESSURE SUPPORT)
PREFILLED_SYRINGE | INTRAVENOUS | Status: DC | PRN
  Administered 2023-11-23 (×3): 80 ug via INTRAVENOUS
  Administered 2023-11-23 (×2): 160 ug via INTRAVENOUS
  Administered 2023-11-23 (×2): 80 ug via INTRAVENOUS

## 2023-11-23 MED ORDER — LACTATED RINGERS IV SOLN: INTRAVENOUS | Status: DC

## 2023-11-23 MED ORDER — VASOPRESSIN 20 UNIT/ML IV SOLN
INTRAVENOUS | Status: AC
  Filled 2023-11-23: qty 1

## 2023-11-23 MED ORDER — METHOCARBAMOL 1000 MG/10ML IJ SOLN: 500.0000 mg | Freq: Four times a day (QID) | INTRAMUSCULAR | Status: DC | PRN

## 2023-11-23 MED ORDER — DEXAMETHASONE SODIUM PHOSPHATE 10 MG/ML IJ SOLN
INTRAMUSCULAR | Status: AC
  Filled 2023-11-23: qty 1

## 2023-11-23 MED ORDER — SUGAMMADEX SODIUM 200 MG/2ML IV SOLN
INTRAVENOUS | Status: DC | PRN
Start: 2023-11-23 — End: 2023-11-23
  Administered 2023-11-23: 200 mg via INTRAVENOUS

## 2023-11-23 MED ORDER — ONDANSETRON HCL 4 MG/2ML IJ SOLN
INTRAMUSCULAR | Status: AC
  Filled 2023-11-23: qty 2

## 2023-11-23 MED ORDER — BUPIVACAINE HCL (PF) 0.25 % IJ SOLN
INTRAMUSCULAR | Status: AC
  Filled 2023-11-23: qty 10

## 2023-11-23 MED ORDER — PROPOFOL 10 MG/ML IV BOLUS
INTRAVENOUS | Status: AC
  Filled 2023-11-23: qty 20

## 2023-11-23 SURGICAL SUPPLY — 60 items
BAG COUNTER SPONGE SURGICOUNT (BAG) ×1 IMPLANT
BENZOIN TINCTURE PRP APPL 2/3 (GAUZE/BANDAGES/DRESSINGS) IMPLANT
BIT DRILL 4.3 (BIT) IMPLANT
BIT DRILL QC 3.3X195 (BIT) IMPLANT
BLADE CLIPPER SURG (BLADE) IMPLANT
BNDG ELASTIC 4X5.8 VLCR STR LF (GAUZE/BANDAGES/DRESSINGS) ×1 IMPLANT
BNDG ELASTIC 6INX 5YD STR LF (GAUZE/BANDAGES/DRESSINGS) ×1 IMPLANT
BNDG GAUZE DERMACEA FLUFF 4 (GAUZE/BANDAGES/DRESSINGS) ×1 IMPLANT
CAP LOCK NCB (Cap) IMPLANT
DRAPE C-ARM 42X72 X-RAY (DRAPES) ×1 IMPLANT
DRAPE C-ARMOR (DRAPES) ×1 IMPLANT
DRAPE IMP U-DRAPE 54X76 (DRAPES) ×1 IMPLANT
DRAPE SURG ORHT 6 SPLT 77X108 (DRAPES) ×2 IMPLANT
DRAPE U-SHAPE 47X51 STRL (DRAPES) ×1 IMPLANT
DRSG ADAPTIC 3X8 NADH LF (GAUZE/BANDAGES/DRESSINGS) ×1 IMPLANT
DRSG AQUACEL AG ADV 3.5X10 (GAUZE/BANDAGES/DRESSINGS) IMPLANT
ELECT REM PT RETURN 9FT ADLT (ELECTROSURGICAL) ×1 IMPLANT
ELECTRODE REM PT RTRN 9FT ADLT (ELECTROSURGICAL) ×1 IMPLANT
GAUZE PAD ABD 8X10 STRL (GAUZE/BANDAGES/DRESSINGS) ×4 IMPLANT
GAUZE SPONGE 4X4 12PLY STRL (GAUZE/BANDAGES/DRESSINGS) ×1 IMPLANT
GLOVE BIO SURGEON STRL SZ7.5 (GLOVE) ×1 IMPLANT
GLOVE BIOGEL PI IND STRL 7.5 (GLOVE) ×1 IMPLANT
GLOVE BIOGEL PI IND STRL 8 (GLOVE) ×1 IMPLANT
GLOVE SURG SYN 7.5 E (GLOVE) ×1 IMPLANT
GLOVE SURG SYN 7.5 PF PI (GLOVE) ×1 IMPLANT
GOWN STRL REUS W/ TWL LRG LVL3 (GOWN DISPOSABLE) ×1 IMPLANT
GOWN STRL REUS W/ TWL XL LVL3 (GOWN DISPOSABLE) ×2 IMPLANT
K-WIRE FXSTD 280X2XNS SS (WIRE) ×3 IMPLANT
KIT BASIN OR (CUSTOM PROCEDURE TRAY) ×1 IMPLANT
KIT TURNOVER KIT B (KITS) ×1 IMPLANT
KWIRE FXSTD 280X2XNS SS (WIRE) IMPLANT
MANIFOLD NEPTUNE II (INSTRUMENTS) ×1 IMPLANT
NS IRRIG 1000ML POUR BTL (IV SOLUTION) ×1 IMPLANT
PACK TOTAL JOINT (CUSTOM PROCEDURE TRAY) ×1 IMPLANT
PACK UNIVERSAL I (CUSTOM PROCEDURE TRAY) ×1 IMPLANT
PAD ARMBOARD POSITIONER FOAM (MISCELLANEOUS) ×2 IMPLANT
PAD CAST 4YDX4 CTTN HI CHSV (CAST SUPPLIES) ×1 IMPLANT
PADDING CAST COTTON 6X4 STRL (CAST SUPPLIES) ×1 IMPLANT
PLATE BONE LOCK 238MM 9HOLE (Plate) IMPLANT
SCREW 5.0 32MM (Screw) IMPLANT
SCREW 5.0 60MM (Screw) IMPLANT
SCREW 5.0 70MM (Screw) IMPLANT
SCREW CORT NCB SELFTAP 5.0X42 (Screw) IMPLANT
SCREW CORT NCB SELFTAP 5.0X50 (Screw) IMPLANT
SCREW NCB 5.0X30MM (Screw) IMPLANT
SCREW NCB 5.0X34MM (Screw) IMPLANT
SCREW NCB 5.0X38 (Screw) IMPLANT
STAPLER VISISTAT 35W (STAPLE) IMPLANT
STRIP CLOSURE SKIN 1/2X4 (GAUZE/BANDAGES/DRESSINGS) IMPLANT
SUT MNCRL AB 4-0 PS2 18 (SUTURE) ×1 IMPLANT
SUT MON AB 2-0 CT1 27 (SUTURE) ×1 IMPLANT
SUT VIC AB 0 CT1 27XBRD ANBCTR (SUTURE) ×2 IMPLANT
SUT VIC AB 1 CTX 27 (SUTURE) IMPLANT
SUT VIC AB 2-0 CT1 TAPERPNT 27 (SUTURE) IMPLANT
TOWEL GREEN STERILE (TOWEL DISPOSABLE) ×2 IMPLANT
TOWEL GREEN STERILE FF (TOWEL DISPOSABLE) ×1 IMPLANT
TOWEL OR NON WOVEN STRL DISP B (DISPOSABLE) ×1 IMPLANT
TRAY FOLEY MTR SLVR 16FR STAT (SET/KITS/TRAYS/PACK) IMPLANT
TUBE SUCT ARGYLE STRL (TUBING) IMPLANT
WATER STERILE IRR 1000ML POUR (IV SOLUTION) ×2 IMPLANT

## 2023-11-23 NOTE — Anesthesia Procedure Notes (Cosign Needed)
 Procedure Name: Intubation Date/Time: 11/23/2023 1:27 PM  Performed by: Val Eagle, MDPre-anesthesia Checklist: Patient identified, Emergency Drugs available, Suction available and Patient being monitored Patient Re-evaluated:Patient Re-evaluated prior to induction Oxygen Delivery Method: Circle system utilized Preoxygenation: Pre-oxygenation with 100% oxygen Induction Type: IV induction Ventilation: Mask ventilation without difficulty Laryngoscope Size: Miller and 2 Grade View: Grade II Tube type: Oral Tube size: 7.0 mm Number of attempts: 1 Airway Equipment and Method: Stylet and Oral airway Placement Confirmation: ETT inserted through vocal cords under direct vision, positive ETCO2 and breath sounds checked- equal and bilateral Secured at: 22 cm Tube secured with: Tape Dental Injury: Teeth and Oropharynx as per pre-operative assessment

## 2023-11-23 NOTE — Consult Note (Signed)
 ORTHOPAEDIC CONSULTATION  REQUESTING PHYSICIAN: Joen Laura, MD  Time called na Time arrived na  Chief Complaint: L Annex femur fracutre  HPI: I have reviewed and agree with below history:  Cheryl [name] is a 68 y.o. female who had L TKA w/ Dr. Eulah Pont in early January. She had been doing very well since surgery. Unfortunately today she had a fall coming down stairs injuring her left knee. She denies pain in other joints or extremities. She denies distal n/t.   Past Medical History:  Diagnosis Date   Allergy    Arthritis    Chest pain    Depression    Fatigue    GERD (gastroesophageal reflux disease)    Hyperlipidemia    Hypertension    Joint pain    Obesity    Osteopenia    Palpitation    Sleep apnea    uses CPAP    SOB (shortness of breath)    Urinary incontinence    Past Surgical History:  Procedure Laterality Date   ABDOMINAL HYSTERECTOMY  08/10/2000   TAH/RSO   BREAST SURGERY     Biopsy-Benign   COLOSTOMY  2000   Dr.Patterson   KNEE ARTHROSCOPY Bilateral    LAPAROSCOPIC CHOLECYSTECTOMY  2010   LAPAROTOMY  1990   Fibroid removed-ovarian cyst    RETINAL DETACHMENT SURGERY Right    It was torn   RHINOPLASTY     Social History   Socioeconomic History   Marital status: Married    Spouse name: Tanashia Ciesla   Number of children: 2   Years of education: 16   Highest education level: Bachelor's degree (e.g., BA, AB, BS)  Occupational History   Occupation: Lawyer  Tobacco Use   Smoking status: Former    Current packs/day: 0.00    Average packs/day: 0.5 packs/day for 10.0 years (5.0 ttl pk-yrs)    Types: Cigarettes    Quit date: 08/15/2007    Years since quitting: 16.2   Smokeless tobacco: Never  Vaping Use   Vaping status: Never Used  Substance and Sexual Activity   Alcohol use: Yes    Alcohol/week: 5.0 standard drinks of alcohol    Types: 2 Glasses of wine, 3 Standard drinks or equivalent per week    Comment:  3 drinks per week per pt    Drug use: No   Sexual activity: Not Currently    Birth control/protection: Post-menopausal, Surgical    Comment: hysterectomy  Other Topics Concern   Not on file  Social History Narrative   2 adopted children, Eric (17) and Buyer, retail (18)   Social Drivers of Corporate investment banker Strain: Low Risk  (06/26/2023)   Overall Financial Resource Strain (CARDIA)    Difficulty of Paying Living Expenses: Not hard at all  Food Insecurity: No Food Insecurity (06/26/2023)   Hunger Vital Sign    Worried About Running Out of Food in the Last Year: Never true    Ran Out of Food in the Last Year: Never true  Transportation Needs: No Transportation Needs (06/26/2023)   PRAPARE - Administrator, Civil Service (Medical): No    Lack of Transportation (Non-Medical): No  Physical Activity: Insufficiently Active (06/26/2023)   Exercise Vital Sign    Days of Exercise per Week: 2 days    Minutes of Exercise per Session: 20 min  Stress: No Stress Concern Present (06/26/2023)   [employer] of Occupational Health - Occupational Stress Questionnaire  Feeling of Stress : Not at all  Recent Concern: Stress - Stress Concern Present (05/20/2023)   [employer] of Occupational Health - Occupational Stress Questionnaire    Feeling of Stress : To some extent  Social Connections: Socially Integrated (06/26/2023)   Social Connection and Isolation Panel [NHANES]    Frequency of Communication with Friends and Family: More than three times a week    Frequency of Social Gatherings with Friends and Family: Three times a week    Attends Religious Services: More than 4 times per year    Active Member of Clubs or Organizations: Yes    Attends Engineer, structural: More than 4 times per year    Marital Status: Married   Family History  Problem Relation Age of Onset   Breast cancer Mother        Age 38   Hypertension Mother    Depression Mother    Arthritis  Mother    Cancer Mother    COPD Mother    Hypertension Father    Arthritis Father    Heart disease Father    Cancer Father        Spleen   Sleep apnea Father    Obesity Paternal Aunt    Obesity Paternal Uncle    Hearing loss Maternal Grandmother    Heart disease Maternal Grandfather    Colon cancer Paternal Grandfather        Colon cancer   Stomach cancer Neg Hx    Colon polyps Neg Hx    Rectal cancer Neg Hx    Esophageal cancer Neg Hx    Allergies  Allergen Reactions   Ace Inhibitors Cough   Lisinopril Cough   Prior to Admission medications   Medication Sig Start Date End Date Taking? Authorizing Provider  acetaminophen (TYLENOL) 650 MG CR tablet Take 650 mg by mouth daily at 6 (six) AM.   Yes [provider]  buPROPion (WELLBUTRIN SR) 200 MG 12 hr tablet Take 1 tablet (200 mg total) by mouth daily. Patient taking differently: Take 200 mg by mouth in the morning. 09/18/23  Yes Beasley, Caren D, MD  docusate sodium (COLACE) 100 MG capsule Take 100 mg by mouth in the morning.   Yes [provider]  Loratadine (CLARITIN PO) Take 1 tablet by mouth in the morning.   Yes [provider]  losartan-hydrochlorothiazide (HYZAAR) 100-12.5 MG tablet Take 1 tablet by mouth daily. Patient taking differently: Take 1 tablet by mouth in the morning. 09/18/23  Yes Quillian Quince D, MD  metFORMIN (GLUCOPHAGE) 500 MG tablet Take 1 tablet (500 mg total) by mouth 2 (two) times daily with a meal. 09/18/23  Yes Beasley, Caren D, MD  UNABLE TO FIND C PAP for sleep apnea    Yes [provider]  venlafaxine (EFFEXOR) 75 MG tablet Take 1 tablet (75 mg total) by mouth daily. Patient taking differently: Take 75 mg by mouth in the morning. 09/18/23  Yes Quillian Quince D, MD  aspirin 81 MG chewable tablet Chew 81 mg by mouth daily.    [provider]   DG Knee Left Port Result Date: 11/22/2023 CLINICAL DATA:  Patient in traction EXAM: PORTABLE LEFT KNEE - 1-2 VIEW  COMPARISON:  11/22/2023 FINDINGS: Knee replacement. Acute comminuted periprosthetic distal femoral fracture with decreased lateral displacement, about 1/2 shaft diameter residual lateral displacement. Residual 1 shaft diameter posterior displacement of distal fracture fragment, decreased overriding, now about 3.2 cm. IMPRESSION: Knee replacement with comminuted and  displaced periprosthetic distal femoral fracture with decreased displacement and overriding compared to prior Electronically Signed   By: Jasmine Pang M.D.   On: 11/22/2023 20:40   DG Knee 1-2 Views Left Result Date: 11/22/2023 CLINICAL DATA:  Knee pain EXAM: LEFT KNEE - 1-2 VIEW COMPARISON:  11/22/2023 FINDINGS: Left knee replacement. Acute comminuted periprosthetic distal femoral fracture with about 1 shaft diameter lateral and posterior displacement of the distal femoral fracture fragment with respect to the shaft. Overriding of fracture fragments by about 4 cm. IMPRESSION: Left knee replacement with acute comminuted displaced and overriding periprosthetic distal femoral fracture. Electronically Signed   By: Jasmine Pang M.D.   On: 11/22/2023 20:39   DG Chest 1 View Result Date: 11/22/2023 CLINICAL DATA:  Fall, left femur fracture. EXAM: CHEST  1 VIEW COMPARISON:  Radiograph 09/17/2011 FINDINGS: Low lung volumes.The cardiomediastinal contours are normal for technique. Question of right apical opacity, not well assessed. Pulmonary vasculature is normal. No pleural effusion or pneumothorax. No acute osseous abnormalities are seen. IMPRESSION: Low lung volumes. Question of right apical opacity versus artifact. Recommend PA and lateral views when patient is able. Electronically Signed   By: Narda Rutherford M.D.   On: 11/22/2023 17:30   DG FEMUR MIN 2 VIEWS LEFT Result Date: 11/22/2023 CLINICAL DATA:  Pain after fall. EXAM: PELVIS - 1-2 VIEW; LEFT FEMUR 2 VIEWS COMPARISON:  None Available. FINDINGS: Pelvis: The cortical margins of the bony  pelvis are intact. No fracture. Pubic symphysis and sacroiliac joints are congruent. Both femoral heads are well-seated in the respective acetabula. Femur: Comminuted and moderately displaced periprosthetic distal femur fracture adjacent to the femoral component of knee arthroplasty. Distal fragment is displaced 1 shaft width laterally with osseous overriding of greater than 4 cm. The tibiofemoral components remain congruent. Generalized soft tissue IMPRESSION: 1. Comminuted and moderately displaced periprosthetic distal femur fracture adjacent to the femoral component of knee arthroplasty. 2. No proximal femur or pelvic fracture. Electronically Signed   By: Narda Rutherford M.D.   On: 11/22/2023 17:29   DG Pelvis 1-2 Views Result Date: 11/22/2023 CLINICAL DATA:  Pain after fall. EXAM: PELVIS - 1-2 VIEW; LEFT FEMUR 2 VIEWS COMPARISON:  None Available. FINDINGS: Pelvis: The cortical margins of the bony pelvis are intact. No fracture. Pubic symphysis and sacroiliac joints are congruent. Both femoral heads are well-seated in the respective acetabula. Femur: Comminuted and moderately displaced periprosthetic distal femur fracture adjacent to the femoral component of knee arthroplasty. Distal fragment is displaced 1 shaft width laterally with osseous overriding of greater than 4 cm. The tibiofemoral components remain congruent. Generalized soft tissue IMPRESSION: 1. Comminuted and moderately displaced periprosthetic distal femur fracture adjacent to the femoral component of knee arthroplasty. 2. No proximal femur or pelvic fracture. Electronically Signed   By: Narda Rutherford M.D.   On: 11/22/2023 17:29   DG Shoulder Left Result Date: 11/22/2023 CLINICAL DATA:  Pain after fall. EXAM: LEFT SHOULDER - 2+ VIEW COMPARISON:  None Available. FINDINGS: There is no evidence of fracture or dislocation. Mild acromioclavicular and glenohumeral degenerative change. Soft tissues are unremarkable. IMPRESSION: 1. No fracture or  dislocation of the left shoulder. 2. Mild degenerative change. Electronically Signed   By: Narda Rutherford M.D.   On: 11/22/2023 17:28    Positive ROS: All other systems have been reviewed and were otherwise negative with the exception of those mentioned in the HPI and as above.  Labs cbc Recent Labs    11/22/23 1718 11/22/23 1736 11/23/23 4782  WBC 16.9*  --  12.0*  HGB 13.5 14.3 11.7*  HCT 41.6 42.0 35.6*  PLT 402*  --  339    Labs inflam No results for input(s): "CRP" in the last 72 hours.  Invalid input(s): "ESR"  Labs coag Recent Labs    11/22/23 2044  INR 1.1    Recent Labs    11/22/23 2044 11/23/23 0412  NA 136 136  K 3.6 3.9  CL 101 103  CO2 24 23  GLUCOSE 124* 133*  BUN 29* 32*  CREATININE 1.36* 1.24*  CALCIUM 8.8* 8.7*    Physical Exam: Vitals:   11/23/23 0838 11/23/23 1024  BP: 112/61 111/85  Pulse: (!) 104 (!) 102  Resp: 18 17  Temp: 97.9 F (36.6 C) 97.8 F (36.6 C)  SpO2: 96% 96%   General: Alert, no acute distress Cardiovascular: No pedal edema Respiratory: No cyanosis, no use of accessory musculature GI: No organomegaly, abdomen is soft and non-tender Skin: No lesions in the area of chief complaint other than those listed below in MSK exam.  Neurologic: Sensation intact distally save for the below mentioned MSK exam Psychiatric: Patient is competent for consent with normal mood and affect Lymphatic: No axillary or cervical lymphadenopathy  MUSCULOSKELETAL:  LLE: compartments soft, NVI, pain at the distal femur Other extremities are atraumatic with painless ROM and NVI.  Assessment: Left periprosthetic supracondylar femur fracture  Plan: Appears that the femoral component is stable I discussed the risks and benefits of open reduction internal fixation of her distal femur she would like to go forward with that today  She will need to be toe-touch weightbearing for 6 weeks.    Sheral Apley, MD    11/23/2023 12:37  PM

## 2023-11-23 NOTE — Transfer of Care (Signed)
 Immediate Anesthesia Transfer of Care Note  Patient: Cheryl [name]  Procedure(s) Performed: OPEN REDUCTION INTERNAL FIXATION (ORIF) DISTAL FEMUR FRACTURE (Left)  Patient Location: PACU  Anesthesia Type:General  Level of Consciousness: awake, drowsy, and patient cooperative  Airway & Oxygen Therapy: Patient Spontanous Breathing and Patient connected to nasal cannula oxygen  Post-op Assessment: Report given to RN and Post -op Vital signs reviewed and stable  Post vital signs: Reviewed and stable  Last Vitals:  Vitals Value Taken Time  BP 107/47 11/23/23 1535  Temp    Pulse 100 11/23/23 1539  Resp 16 11/23/23 1539  SpO2 94 % 11/23/23 1539  Vitals shown include unfiled device data.  Last Pain:  Vitals:   11/23/23 1024  TempSrc: Oral  PainSc: 0-No pain         Complications: No notable events documented.

## 2023-11-23 NOTE — Progress Notes (Signed)
     Subjective:  Patient reports pain as mild.  NPO for OR today. Denies distal n/t. Denies pain in other joints or extremities.  Objective:   VITALS:   Vitals:   11/22/23 1555 11/22/23 1825 11/22/23 2009 11/23/23 0439  BP:  121/71 120/75 110/77  Pulse:  67 75 100  Resp:  18 19 17   Temp: (!) 96.5 F (35.8 C) 97.6 F (36.4 C) 98 F (36.7 C) 98.5 F (36.9 C)  TempSrc: Axillary Oral Oral   SpO2:  100% 98% 98%  Weight:      Height:        Neurovascular intact Sensation intact distally Intact pulses distally Dorsiflexion/Plantar flexion intact Compartment soft    Lab Results  Component Value Date   WBC 12.0 (H) 11/23/2023   HGB 11.7 (L) 11/23/2023   HCT 35.6 (L) 11/23/2023   MCV 85.4 11/23/2023   PLT 339 11/23/2023   BMET    Component Value Date/Time   NA 136 11/23/2023 0412   NA 140 05/08/2023 0910   K 3.9 11/23/2023 0412   CL 103 11/23/2023 0412   CO2 23 11/23/2023 0412   GLUCOSE 133 (H) 11/23/2023 0412   BUN 32 (H) 11/23/2023 0412   BUN 25 05/08/2023 0910   CREATININE 1.24 (H) 11/23/2023 0412   CREATININE 0.81 06/08/2016 1353   CALCIUM 8.7 (L) 11/23/2023 0412   EGFR 68 05/08/2023 0910   GFRNONAA 48 (L) 11/23/2023 0412      Xray: left comminuted periprosthetic distal femur fracture  Assessment/Plan:     Principal Problem:   Closed fracture of right femur, unspecified fracture morphology, unspecified portion of femur, sequela  Left periprosthetic distal femur fracture  Plan for ORIF left distal femur fracture today by myself or Dr. Eulah Pont pending OR timing.  Patient has been NPO since midnight. Will start DVT prophylaxis post op.  The risks benefits and alternatives were discussed with the patient including but not limited to the risks of nonoperative treatment, versus surgical intervention including infection, bleeding, nerve injury, malunion, nonunion, the need for revision surgery, hardware prominence, hardware failure, the need for hardware  removal, blood clots, cardiopulmonary complications, morbidity, mortality, among others, and they were willing to proceed.    Burch Marchuk A Summar Mcglothlin 11/23/2023, 6:53 AM   Weber Cooks, MD  Contact information:   [phone] 7am-5pm epic message Dr. Blanchie Dessert, or call office for patient follow up: [phone] After hours and holidays please check [email or web address] for group call information for [facility]

## 2023-11-23 NOTE — Anesthesia Preprocedure Evaluation (Addendum)
 Anesthesia Evaluation  Patient identified by MRN, date of birth, ID band Patient awake    Reviewed: Allergy & Precautions, H&P , NPO status , Patient's Chart, lab work & pertinent test results  History of Anesthesia Complications Negative for: history of anesthetic complications  Airway Mallampati: III  TM Distance: >3 FB Neck ROM: full    Dental  (+) Dental Advisory Given, Teeth Intact, Caps   Pulmonary neg shortness of breath, sleep apnea and Continuous Positive Airway Pressure Ventilation , neg COPD, neg recent URI, former smoker   breath sounds clear to auscultation       Cardiovascular hypertension, Pt. on medications (-) angina (-) Past MI and (-) CHF  Rhythm:regular  Left ventricle: The cavity size was normal. Wall thickness was    increased in a pattern of mild LVH. Systolic function was normal.    The estimated ejection fraction was in the range of 60% to 65%.    Wall motion was normal; there were no regional wall motion    abnormalities. Doppler parameters are consistent with abnormal    left ventricular relaxation (grade 1 diastolic dysfunction). The    E/e&' ratio is <8, suggesting normal LV filling pressure.  - Mitral valve: Mildly thickened leaflets . There was trivial    regurgitation.  - Left atrium: The atrium was normal in size.  - Tricuspid valve: There was mild regurgitation.  - Pulmonary arteries: PA peak pressure: 30 mm Hg (S).  - Inferior vena cava: The vessel was normal in size. The    respirophasic diameter changes were in the normal range (= 50%),    consistent with normal central venous pressure.     Neuro/Psych  PSYCHIATRIC DISORDERS  Depression    negative neurological ROS     GI/Hepatic Neg liver ROS,GERD  ,,  Endo/Other    Class 3 obesity  Renal/GU Renal InsufficiencyRenal diseaseLab Results      Component                Value               Date                      NA                        136                 11/23/2023                K                        3.9                 11/23/2023                CO2                      23                  11/23/2023                GLUCOSE                  133 (H)             11/23/2023  BUN                      32 (H)              11/23/2023                CREATININE               1.24 (H)            11/23/2023                CALCIUM                  8.7 (L)             11/23/2023                EGFR                     68                  05/08/2023                GFRNONAA                 48 (L)              11/23/2023                Musculoskeletal  (+) Arthritis ,    Abdominal   Peds  Hematology  (+) Blood dyscrasia, anemia Lab Results      Component                Value               Date                      WBC                      12.0 (H)            11/23/2023                HGB                      11.7 (L)            11/23/2023                HCT                      35.6 (L)            11/23/2023                MCV                      85.4                11/23/2023                PLT                      339                 11/23/2023              Anesthesia Other Findings   Reproductive/Obstetrics  Anesthesia Physical Anesthesia Plan  ASA: 2  Anesthesia Plan: General   Post-op Pain Management: Ofirmev IV (intra-op)*   Induction: Intravenous  PONV Risk Score and Plan: 4 or greater and Ondansetron, Dexamethasone, Midazolam and Treatment may vary due to age or medical condition  Airway Management Planned: Oral ETT  Additional Equipment: None  Intra-op Plan:   Post-operative Plan: Extubation in OR  Informed Consent: I have reviewed the patients History and Physical, chart, labs and discussed the procedure including the risks, benefits and alternatives for the proposed anesthesia with the patient or authorized representative who has  indicated his/her understanding and acceptance.     Dental advisory given  Plan Discussed with: CRNA, Anesthesiologist and Surgeon  Anesthesia Plan Comments:         Anesthesia Quick Evaluation

## 2023-11-23 NOTE — Interval H&P Note (Signed)
 History and Physical Interval Note:  11/23/2023 12:40 PM  Cheryl [name]  has presented today for surgery, with the diagnosis of left distal femur fracture.  The various methods of treatment have been discussed with the patient and family. After consideration of risks, benefits and other options for treatment, the patient has consented to  Procedure(s): OPEN REDUCTION INTERNAL FIXATION (ORIF) DISTAL FEMUR FRACTURE (Left) as a surgical intervention.  The patient's history has been reviewed, patient examined, no change in status, stable for surgery.  I have reviewed the patient's chart and labs.  Questions were answered to the patient's satisfaction.     Sheral Apley

## 2023-11-23 NOTE — Discharge Instructions (Signed)

## 2023-11-23 NOTE — Op Note (Signed)
 11/22/2023 - 11/23/2023  2:39 PM  PATIENT:  Bertrum Sol    PRE-OPERATIVE DIAGNOSIS:  left distal femur fracture  POST-OPERATIVE DIAGNOSIS:  Same  PROCEDURE:  OPEN REDUCTION INTERNAL FIXATION (ORIF) DISTAL FEMUR FRACTURE  SURGEON:  Sheral Apley, MD  ASSISTANT: Levester Fresh, PA-C, he was present and scrubbed throughout the case, critical for completion in a timely fashion, and for retraction, instrumentation, and closure.   ANESTHESIA:   gen  PREOPERATIVE INDICATIONS:  Kayana Thoen is a  68 y.o. female with a diagnosis of left distal femur fracture who failed conservative measures and elected for surgical management.    The risks benefits and alternatives were discussed with the patient preoperatively including but not limited to the risks of infection, bleeding, nerve injury, cardiopulmonary complications, the need for revision surgery, among others, and the patient was willing to proceed.  OPERATIVE IMPLANTS: Stryker distal femur locking plate  OPERATIVE FINDINGS: supracondylar fracture  BLOOD LOSS: 300  COMPLICATIONS: none  TOURNIQUET TIME: none  OPERATIVE PROCEDURE:  Patient was identified in the preoperative holding area and site was marked by me She was transported to the operating theater and placed on the table in the lateral position taking care to pad all bony prominences. After a preincinduction time out anesthesia was induced. The left lower extremity was prepped and draped in normal sterile fashion and a pre-incision timeout was performed. She received ancef for preoperative antibiotics.   I made a lateral leg incision from the joint line superiorly. I carried my dissection down sharply to the IT band which was split in line with the incision. I then used a Cobb to elevate the muscle off of the lateral femur and the fracture was identified immediately.  I then debrided the fracture site and a performed a reduction maneuver I selected the  appropriate length locking plate for the distal femur and pinned this into place with a K wire holding the fracture reduced as well.  I then placed a combination of locking and nonlocking screws to help with fracture reduction and stabilization in the proximal aspect of the plate as well as the distal aspect of the plate. I uses many distal locking screws as possible. Care was taken to not penetrate the distal end of the bone.   Had good bites of my locking screws proximal bicortical screws had good bites the bone was soft distally.  I then took multiple fluoroscopic views and was happy with the reduction and placement of all hardware. I then thoroughly irrigated the wound and closed the IT band followed by the skin in layers with absorbable stitch.  POST OPERATIVE PLAN: Knee immobilizer full-time, TDWB. dvt px: scd's/TED's, and mobilization and chemical px as indicated

## 2023-11-23 NOTE — H&P (View-Only) (Signed)
 ORTHOPAEDIC CONSULTATION  REQUESTING PHYSICIAN: Joen Laura, MD  Time called na Time arrived na  Chief Complaint: L Annex femur fracutre  HPI: I have reviewed and agree with below history:  Cheryl [name] is a 68 y.o. female who had L TKA w/ Dr. Eulah Pont in early January. She had been doing very well since surgery. Unfortunately today she had a fall coming down stairs injuring her left knee. She denies pain in other joints or extremities. She denies distal n/t.   Past Medical History:  Diagnosis Date   Allergy    Arthritis    Chest pain    Depression    Fatigue    GERD (gastroesophageal reflux disease)    Hyperlipidemia    Hypertension    Joint pain    Obesity    Osteopenia    Palpitation    Sleep apnea    uses CPAP    SOB (shortness of breath)    Urinary incontinence    Past Surgical History:  Procedure Laterality Date   ABDOMINAL HYSTERECTOMY  08/10/2000   TAH/RSO   BREAST SURGERY     Biopsy-Benign   COLOSTOMY  2000   Dr.Patterson   KNEE ARTHROSCOPY Bilateral    LAPAROSCOPIC CHOLECYSTECTOMY  2010   LAPAROTOMY  1990   Fibroid removed-ovarian cyst    RETINAL DETACHMENT SURGERY Right    It was torn   RHINOPLASTY     Social History   Socioeconomic History   Marital status: Married    Spouse name: Tanashia Ciesla   Number of children: 2   Years of education: 16   Highest education level: Bachelor's degree (e.g., BA, AB, BS)  Occupational History   Occupation: Lawyer  Tobacco Use   Smoking status: Former    Current packs/day: 0.00    Average packs/day: 0.5 packs/day for 10.0 years (5.0 ttl pk-yrs)    Types: Cigarettes    Quit date: 08/15/2007    Years since quitting: 16.2   Smokeless tobacco: Never  Vaping Use   Vaping status: Never Used  Substance and Sexual Activity   Alcohol use: Yes    Alcohol/week: 5.0 standard drinks of alcohol    Types: 2 Glasses of wine, 3 Standard drinks or equivalent per week    Comment:  3 drinks per week per pt    Drug use: No   Sexual activity: Not Currently    Birth control/protection: Post-menopausal, Surgical    Comment: hysterectomy  Other Topics Concern   Not on file  Social History Narrative   2 adopted children, Eric (17) and Buyer, retail (18)   Social Drivers of Corporate investment banker Strain: Low Risk  (06/26/2023)   Overall Financial Resource Strain (CARDIA)    Difficulty of Paying Living Expenses: Not hard at all  Food Insecurity: No Food Insecurity (06/26/2023)   Hunger Vital Sign    Worried About Running Out of Food in the Last Year: Never true    Ran Out of Food in the Last Year: Never true  Transportation Needs: No Transportation Needs (06/26/2023)   PRAPARE - Administrator, Civil Service (Medical): No    Lack of Transportation (Non-Medical): No  Physical Activity: Insufficiently Active (06/26/2023)   Exercise Vital Sign    Days of Exercise per Week: 2 days    Minutes of Exercise per Session: 20 min  Stress: No Stress Concern Present (06/26/2023)   [employer] of Occupational Health - Occupational Stress Questionnaire  Feeling of Stress : Not at all  Recent Concern: Stress - Stress Concern Present (05/20/2023)   [employer] of Occupational Health - Occupational Stress Questionnaire    Feeling of Stress : To some extent  Social Connections: Socially Integrated (06/26/2023)   Social Connection and Isolation Panel [NHANES]    Frequency of Communication with Friends and Family: More than three times a week    Frequency of Social Gatherings with Friends and Family: Three times a week    Attends Religious Services: More than 4 times per year    Active Member of Clubs or Organizations: Yes    Attends Engineer, structural: More than 4 times per year    Marital Status: Married   Family History  Problem Relation Age of Onset   Breast cancer Mother        Age 38   Hypertension Mother    Depression Mother    Arthritis  Mother    Cancer Mother    COPD Mother    Hypertension Father    Arthritis Father    Heart disease Father    Cancer Father        Spleen   Sleep apnea Father    Obesity Paternal Aunt    Obesity Paternal Uncle    Hearing loss Maternal Grandmother    Heart disease Maternal Grandfather    Colon cancer Paternal Grandfather        Colon cancer   Stomach cancer Neg Hx    Colon polyps Neg Hx    Rectal cancer Neg Hx    Esophageal cancer Neg Hx    Allergies  Allergen Reactions   Ace Inhibitors Cough   Lisinopril Cough   Prior to Admission medications   Medication Sig Start Date End Date Taking? Authorizing Provider  acetaminophen (TYLENOL) 650 MG CR tablet Take 650 mg by mouth daily at 6 (six) AM.   Yes [provider]  buPROPion (WELLBUTRIN SR) 200 MG 12 hr tablet Take 1 tablet (200 mg total) by mouth daily. Patient taking differently: Take 200 mg by mouth in the morning. 09/18/23  Yes Beasley, Caren D, MD  docusate sodium (COLACE) 100 MG capsule Take 100 mg by mouth in the morning.   Yes [provider]  Loratadine (CLARITIN PO) Take 1 tablet by mouth in the morning.   Yes [provider]  losartan-hydrochlorothiazide (HYZAAR) 100-12.5 MG tablet Take 1 tablet by mouth daily. Patient taking differently: Take 1 tablet by mouth in the morning. 09/18/23  Yes Quillian Quince D, MD  metFORMIN (GLUCOPHAGE) 500 MG tablet Take 1 tablet (500 mg total) by mouth 2 (two) times daily with a meal. 09/18/23  Yes Beasley, Caren D, MD  UNABLE TO FIND C PAP for sleep apnea    Yes [provider]  venlafaxine (EFFEXOR) 75 MG tablet Take 1 tablet (75 mg total) by mouth daily. Patient taking differently: Take 75 mg by mouth in the morning. 09/18/23  Yes Quillian Quince D, MD  aspirin 81 MG chewable tablet Chew 81 mg by mouth daily.    [provider]   DG Knee Left Port Result Date: 11/22/2023 CLINICAL DATA:  Patient in traction EXAM: PORTABLE LEFT KNEE - 1-2 VIEW  COMPARISON:  11/22/2023 FINDINGS: Knee replacement. Acute comminuted periprosthetic distal femoral fracture with decreased lateral displacement, about 1/2 shaft diameter residual lateral displacement. Residual 1 shaft diameter posterior displacement of distal fracture fragment, decreased overriding, now about 3.2 cm. IMPRESSION: Knee replacement with comminuted and  displaced periprosthetic distal femoral fracture with decreased displacement and overriding compared to prior Electronically Signed   By: Jasmine Pang M.D.   On: 11/22/2023 20:40   DG Knee 1-2 Views Left Result Date: 11/22/2023 CLINICAL DATA:  Knee pain EXAM: LEFT KNEE - 1-2 VIEW COMPARISON:  11/22/2023 FINDINGS: Left knee replacement. Acute comminuted periprosthetic distal femoral fracture with about 1 shaft diameter lateral and posterior displacement of the distal femoral fracture fragment with respect to the shaft. Overriding of fracture fragments by about 4 cm. IMPRESSION: Left knee replacement with acute comminuted displaced and overriding periprosthetic distal femoral fracture. Electronically Signed   By: Jasmine Pang M.D.   On: 11/22/2023 20:39   DG Chest 1 View Result Date: 11/22/2023 CLINICAL DATA:  Fall, left femur fracture. EXAM: CHEST  1 VIEW COMPARISON:  Radiograph 09/17/2011 FINDINGS: Low lung volumes.The cardiomediastinal contours are normal for technique. Question of right apical opacity, not well assessed. Pulmonary vasculature is normal. No pleural effusion or pneumothorax. No acute osseous abnormalities are seen. IMPRESSION: Low lung volumes. Question of right apical opacity versus artifact. Recommend PA and lateral views when patient is able. Electronically Signed   By: Narda Rutherford M.D.   On: 11/22/2023 17:30   DG FEMUR MIN 2 VIEWS LEFT Result Date: 11/22/2023 CLINICAL DATA:  Pain after fall. EXAM: PELVIS - 1-2 VIEW; LEFT FEMUR 2 VIEWS COMPARISON:  None Available. FINDINGS: Pelvis: The cortical margins of the bony  pelvis are intact. No fracture. Pubic symphysis and sacroiliac joints are congruent. Both femoral heads are well-seated in the respective acetabula. Femur: Comminuted and moderately displaced periprosthetic distal femur fracture adjacent to the femoral component of knee arthroplasty. Distal fragment is displaced 1 shaft width laterally with osseous overriding of greater than 4 cm. The tibiofemoral components remain congruent. Generalized soft tissue IMPRESSION: 1. Comminuted and moderately displaced periprosthetic distal femur fracture adjacent to the femoral component of knee arthroplasty. 2. No proximal femur or pelvic fracture. Electronically Signed   By: Narda Rutherford M.D.   On: 11/22/2023 17:29   DG Pelvis 1-2 Views Result Date: 11/22/2023 CLINICAL DATA:  Pain after fall. EXAM: PELVIS - 1-2 VIEW; LEFT FEMUR 2 VIEWS COMPARISON:  None Available. FINDINGS: Pelvis: The cortical margins of the bony pelvis are intact. No fracture. Pubic symphysis and sacroiliac joints are congruent. Both femoral heads are well-seated in the respective acetabula. Femur: Comminuted and moderately displaced periprosthetic distal femur fracture adjacent to the femoral component of knee arthroplasty. Distal fragment is displaced 1 shaft width laterally with osseous overriding of greater than 4 cm. The tibiofemoral components remain congruent. Generalized soft tissue IMPRESSION: 1. Comminuted and moderately displaced periprosthetic distal femur fracture adjacent to the femoral component of knee arthroplasty. 2. No proximal femur or pelvic fracture. Electronically Signed   By: Narda Rutherford M.D.   On: 11/22/2023 17:29   DG Shoulder Left Result Date: 11/22/2023 CLINICAL DATA:  Pain after fall. EXAM: LEFT SHOULDER - 2+ VIEW COMPARISON:  None Available. FINDINGS: There is no evidence of fracture or dislocation. Mild acromioclavicular and glenohumeral degenerative change. Soft tissues are unremarkable. IMPRESSION: 1. No fracture or  dislocation of the left shoulder. 2. Mild degenerative change. Electronically Signed   By: Narda Rutherford M.D.   On: 11/22/2023 17:28    Positive ROS: All other systems have been reviewed and were otherwise negative with the exception of those mentioned in the HPI and as above.  Labs cbc Recent Labs    11/22/23 1718 11/22/23 1736 11/23/23 4782  WBC 16.9*  --  12.0*  HGB 13.5 14.3 11.7*  HCT 41.6 42.0 35.6*  PLT 402*  --  339    Labs inflam No results for input(s): "CRP" in the last 72 hours.  Invalid input(s): "ESR"  Labs coag Recent Labs    11/22/23 2044  INR 1.1    Recent Labs    11/22/23 2044 11/23/23 0412  NA 136 136  K 3.6 3.9  CL 101 103  CO2 24 23  GLUCOSE 124* 133*  BUN 29* 32*  CREATININE 1.36* 1.24*  CALCIUM 8.8* 8.7*    Physical Exam: Vitals:   11/23/23 0838 11/23/23 1024  BP: 112/61 111/85  Pulse: (!) 104 (!) 102  Resp: 18 17  Temp: 97.9 F (36.6 C) 97.8 F (36.6 C)  SpO2: 96% 96%   General: Alert, no acute distress Cardiovascular: No pedal edema Respiratory: No cyanosis, no use of accessory musculature GI: No organomegaly, abdomen is soft and non-tender Skin: No lesions in the area of chief complaint other than those listed below in MSK exam.  Neurologic: Sensation intact distally save for the below mentioned MSK exam Psychiatric: Patient is competent for consent with normal mood and affect Lymphatic: No axillary or cervical lymphadenopathy  MUSCULOSKELETAL:  LLE: compartments soft, NVI, pain at the distal femur Other extremities are atraumatic with painless ROM and NVI.  Assessment: Left periprosthetic supracondylar femur fracture  Plan: Appears that the femoral component is stable I discussed the risks and benefits of open reduction internal fixation of her distal femur she would like to go forward with that today  She will need to be toe-touch weightbearing for 6 weeks.    Sheral Apley, MD    11/23/2023 12:37  PM

## 2023-11-23 NOTE — Plan of Care (Signed)
  Problem: Nutrition: Goal: Adequate nutrition will be maintained Outcome: Progressing   Problem: Pain Management: Goal: Pain level will decrease Outcome: Progressing

## 2023-11-24 MED ORDER — SODIUM CHLORIDE 0.45 % IV BOLUS: 1000.0000 mL | Freq: Once | INTRAVENOUS | Status: DC

## 2023-11-24 MED ORDER — SODIUM CHLORIDE 0.9 % IV BOLUS
1000.0000 mL | Freq: Once | INTRAVENOUS | Status: AC
  Administered 2023-11-24: 1000 mL via INTRAVENOUS

## 2023-11-24 NOTE — Progress Notes (Addendum)
 Called office of Gilberto Labella for low blood pressure,relayed message to receptionist who sent message to on Call orthopedic.   At 2047, Received a call from PA Nelson, 1L bolus of NS ordered to patient.

## 2023-11-24 NOTE — Progress Notes (Signed)
Inpatient Rehab Admissions Coordinator:   Per therapy recommendation, patient was screened for CIR candidacy by Valor Quaintance, MS, CCC-SLP. At this time, Pt. does not appear to demonstrate medical necessity to justify in hospital rehabilitation/CIR. will not pursue a rehab consult for this Pt.   Recommend other rehab venues to be pursued.  Please contact me with any questions.  Edita Weyenberg, MS, CCC-SLP Rehab Admissions Coordinator  [phone] (celll) [phone] (office)  

## 2023-11-24 NOTE — Plan of Care (Signed)
   Problem: Education: Goal: Knowledge of General Education information will improve Description: Including pain rating scale, medication(s)/side effects and non-pharmacologic comfort measures Outcome: Progressing   Problem: Health Behavior/Discharge Planning: Goal: Ability to manage health-related needs will improve Outcome: Progressing   Problem: Activity: Goal: Risk for activity intolerance will decrease Outcome: Progressing   Problem: Nutrition: Goal: Adequate nutrition will be maintained Outcome: Progressing   Problem: Coping: Goal: Level of anxiety will decrease Outcome: Progressing   Problem: Pain Managment: Goal: General experience of comfort will improve and/or be controlled Outcome: Progressing   Problem: Safety: Goal: Ability to remain free from injury will improve Outcome: Progressing   Problem: Skin Integrity: Goal: Risk for impaired skin integrity will decrease Outcome: Progressing

## 2023-11-24 NOTE — Evaluation (Signed)
 Physical Therapy Evaluation Patient Details Name: Cheryl [name] MRN: [id number] DOB: Jun 05, 1956 Today's Date: 11/24/2023  History of Present Illness  Pt is a 68 y.o. female admitted 11/23/23 s/p fall with knee pain. Xray reveals left distal femur fx, underwent ORIF on 11/23/23.  PMH significant for L TKA w/ Dr. Abigail Abler in early January, HTN, obesity.  Clinical Impression  Patient presents with dependencies in gait and transfers.  Patient now TTWB and struggled during today's session to maintain.  Patient will likely need to be limited to transfers only until she can  maintain WB status.  We discussed the need for a wheelchair and ramp at discharge.  Patient will benefit from intensive inpatient follow-up therapy, >3 hours/day prior to return home (but feel patient will want to go directly home).  Need clarification from MD on range of motion exercises for left LE.          If plan is discharge home, recommend the following: A little help with walking and/or transfers;A little help with bathing/dressing/bathroom;Assistance with cooking/housework;Assist for transportation;Help with stairs or ramp for entrance   Can travel by private vehicle        Equipment Recommendations BSC/3in1;Wheelchair (measurements PT)  Recommendations for Other Services       Functional Status Assessment Patient has had a recent decline in their functional status and demonstrates the ability to make significant improvements in function in a reasonable and predictable amount of time.     Precautions / Restrictions Precautions Precautions: Knee Recall of Precautions/Restrictions: Intact Required Braces or Orthoses: Knee Immobilizer - Left Knee Immobilizer - Left: On at all times Restrictions Weight Bearing Restrictions Per Provider Order: Yes LLE Weight Bearing Per Provider Order: Touchdown weight bearing      Mobility  Bed Mobility Overal bed mobility: Needs Assistance Bed Mobility: Supine to Sit      Supine to sit: Min assist          Transfers Overall transfer level: Needs assistance Equipment used: Rolling walker (2 wheels) Transfers: Sit to/from Stand Sit to Stand: Mod assist                Ambulation/Gait Ambulation/Gait assistance: Min assist Gait Distance (Feet): 10 Feet (x 2) Assistive device: Rolling walker (2 wheels) Gait Pattern/deviations: Step-to pattern       General Gait Details: pt with difficulty maintaining TDWB, requiring increased verbal and tactile cues.  Stairs            Wheelchair Mobility     Tilt Bed    Modified Rankin (Stroke Patients Only)       Balance Overall balance assessment: Needs assistance Sitting-balance support: No upper extremity supported, Feet supported Sitting balance-Leahy Scale: Good     Standing balance support: Bilateral upper extremity supported, During functional activity, Reliant on assistive device for balance Standing balance-Leahy Scale: Poor                               Pertinent Vitals/Pain Pain Assessment Pain Assessment: 0-10 Pain Score: 4  Pain Location: left LE Pain Descriptors / Indicators: Discomfort, Dull, Operative site guarding Pain Intervention(s): Limited activity within patient's tolerance, Monitored during session    Home Living Family/patient expects to be discharged to:: Private residence Living Arrangements: Spouse/significant other Available Help at Discharge: Family;Available 24 hours/day Type of Home: House Home Access: Stairs to enter Entrance Stairs-Rails: Right Entrance Stairs-Number of Steps: 2   Home Layout: One level Home  Equipment: Agricultural consultant (2 wheels);Cane - single point      Prior Function Prior Level of Function : Independent/Modified Independent             Mobility Comments: Modified indep with cane in recent weeks       Extremity/Trunk Assessment        Lower Extremity Assessment Lower Extremity Assessment: LLE  deficits/detail;RLE deficits/detail RLE Deficits / Details: WFLfor strenght and ROM LLE: Unable to fully assess due to immobilization       Communication        Cognition Arousal: Alert Behavior During Therapy: WFL for tasks assessed/performed   PT - Cognitive impairments: No apparent impairments                                 Cueing       General Comments      Exercises Total Joint Exercises Ankle Circles/Pumps: AROM, Both, 10 reps, Seated   Assessment/Plan    PT Assessment Patient needs continued PT services  PT Problem List Decreased strength;Decreased range of motion;Decreased activity tolerance;Decreased balance;Decreased mobility;Decreased knowledge of use of DME;Decreased knowledge of precautions;Obesity;Pain       PT Treatment Interventions DME instruction;Gait training;Stair training;Functional mobility training;Therapeutic activities;Therapeutic exercise;Balance training;Patient/family education;Wheelchair mobility training    PT Goals (Current goals can be found in the Care Plan section)  Acute Rehab PT Goals Patient Stated Goal: get back home PT Goal Formulation: With patient Time For Goal Achievement: 12/01/23 Potential to Achieve Goals: Good    Frequency Min 4X/week     Co-evaluation               AM-PAC PT "6 Clicks" Mobility  Outcome Measure Help needed turning from your back to your side while in a flat bed without using bedrails?: A Little Help needed moving from lying on your back to sitting on the side of a flat bed without using bedrails?: A Little Help needed moving to and from a bed to a chair (including a wheelchair)?: A Lot Help needed standing up from a chair using your arms (e.g., wheelchair or bedside chair)?: A Lot Help needed to walk in hospital room?: A Little Help needed climbing 3-5 steps with a railing? : Total 6 Click Score: 14    End of Session Equipment Utilized During Treatment: Gait belt;Left knee  immobilizer Activity Tolerance: Patient tolerated treatment well Patient left: in chair;with call bell/phone within reach;with chair alarm set Nurse Communication: Mobility status PT Visit Diagnosis: Unsteadiness on feet (R26.81);Other abnormalities of gait and mobility (R26.89);History of falling (Z91.81)    Time: 4098-1191 PT Time Calculation (min) (ACUTE ONLY): 31 min   Charges:   PT Evaluation $PT Eval Moderate Complexity: 1 Mod PT Treatments $Therapeutic Activity: 23-37 mins PT General Charges $$ ACUTE PT VISIT: 1 Visit         11/24/2023 Sammi Crick, PT Acute Rehabilitation Services Office:  [phone]   Norene Beards 11/24/2023, 3:45 PM

## 2023-11-24 NOTE — Progress Notes (Signed)
   ORTHOPAEDIC PROGRESS NOTE  s/p Procedure(s): OPEN REDUCTION INTERNAL FIXATION (ORIF) DISTAL FEMUR FRACTURE  SUBJECTIVE: Reports mild pain about operative site.   No chest pain. No SOB. No nausea/vomiting. No other complaints.  OBJECTIVE: PE: General: sitting in hospital bed, NAD LLE: dressing CDI, knee immobilizer in place, intact EHL/TA/GSC, warm well perfused foot   Vitals:   11/24/23 0529 11/24/23 0805  BP: 93/66 (!) 102/56  Pulse: 93 96  Resp: 17 17  Temp: 98.5 F (36.9 C) 98.9 F (37.2 C)  SpO2: 99% 98%     ASSESSMENT: Cheryl [name] is a 67 y.o. female doing well postoperatively. POD#1  PLAN: Weightbearing: TDWB LLE Insicional and dressing care: Reinforce dressings as needed Orthopedic device(s):  Knee immobilizer Showering: Hold for now VTE prophylaxis: Lovenox 30 mg Pain control: PRN pain medications, minimize narcotics as able Follow - up plan: 2 weeks in office Dispo: TBD. PT/OT evals today.  Contact information:  After hours and holidays please check [email or web address] for group call information for [facility]  Nicholas Bari, PA-C 11/24/2023

## 2023-11-24 NOTE — Progress Notes (Signed)
 Physical Therapy Treatment Patient Details Name: Cheryl [name] MRN: [id number] DOB: 06/18/1956 Today's Date: 11/24/2023   History of Present Illness Pt is a 68 y.o. female admitted 11/23/23 s/p fall with knee pain. Xray reveals left distal femur fx, underwent ORIF on 11/23/23.  PMH significant for L TKA w/ Dr. Abigail Abler in early January, HTN, obesity.    PT Comments  Pt received in chair, RN present and requesting PTA assist as pt not able to stand safely from chair height with +1 physical assist. Pt given multimodal cues for safe technique for sit<>stand from chair and BSC using RW within LLE TWB precs but needing increased up to +2 modA to stand from lower surface height. DME clarified below as pt needing to keep LLE fully in extension and therefore WC will need elevating leg rests, also having difficulty with TWB during transfer so drop arm on [facility] will allow her to more safely transfer from [facility] or EOB to toilet until she is able to mobilize more safely. Noted care team clarified that LLE with no knee ROM allowed at this time, pt/RN updated and also updated below, whiteboard updated as well. Patient will benefit from intensive inpatient follow-up therapy, >3 hours/day, she is not yet safe to DC home. In order to DC home, pt will need to ascend 2 steps. In next session, plan to work on caregiver training for pt stair negotiation with shower chair vs educate on lifting WC up over steps with +2-3 assist vs renting/installing a ramp pending pt progress and disposition location available to her.   If plan is discharge home, recommend the following: A little help with bathing/dressing/bathroom;Assistance with cooking/housework;Assist for transportation;Help with stairs or ramp for entrance;A lot of help with walking and/or transfers   Can travel by private vehicle        Equipment Recommendations  BSC/3in1;Wheelchair (measurements PT) (removable arm and leg rests, elevating leg rests, drop arm BSC)     Recommendations for Other Services       Precautions / Restrictions Precautions Precautions: Fall;Knee Precaution Booklet Issued: No Precaution/Restrictions Comments: L knee NO ROM, KI at all times Required Braces or Orthoses: Knee Immobilizer - Left Knee Immobilizer - Left: On at all times Restrictions Weight Bearing Restrictions Per Provider Order: Yes LLE Weight Bearing Per Provider Order: Touchdown weight bearing     Mobility  Bed Mobility Overal bed mobility: Needs Assistance Bed Mobility: Sit to Supine       Sit to supine: Min assist   General bed mobility comments: BLE assist    Transfers Overall transfer level: Needs assistance Equipment used: Rolling walker (2 wheels) Transfers: Sit to/from Stand, Bed to chair/wheelchair/BSC Sit to Stand: Mod assist, +2 physical assistance Stand pivot transfers: Mod assist, +2 safety/equipment Step pivot transfers: Min assist, +2 physical assistance, From elevated surface       General transfer comment: RN requesting PTA assist with sit>stand from chair height as she was unable to perform with only +1 assist. After multimodal cues, pt performs with +2 modA with RN and PTA lift assist then min to modA for stand/step pivot from chair>BSC then BSC>EOB using RW. Pt did better with stand pivot as she appears to put too much weight through LLE with step pivot. Reviewed it is safer to always transfer to "stronger" R side for now.    Ambulation/Gait               General Gait Details: defer, pt w/difficulty with LLE TWB precs  Stairs             Wheelchair Mobility     Tilt Bed    Modified Rankin (Stroke Patients Only)       Balance Overall balance assessment: Needs assistance Sitting-balance support: No upper extremity supported, Feet supported Sitting balance-Leahy Scale: Good     Standing balance support: Bilateral upper extremity supported, During functional activity, Reliant on assistive device for  balance Standing balance-Leahy Scale: Poor Standing balance comment: reliant on RW given her precs                            Communication Communication Communication: No apparent difficulties  Cognition Arousal: Alert Behavior During Therapy: WFL for tasks assessed/performed   PT - Cognitive impairments: Safety/Judgement, Problem solving, Sequencing                       PT - Cognition Comments: Pt attempting to pull up on RW multiple times despite cues. Errorless learning strategy may work better in future sessions as pt appears distracted due to pain or anxiety and also with difficulty maintaining LLE TWB precs. Following commands: Intact      Cueing Cueing Techniques: Verbal cues, Gestural cues, Tactile cues  Exercises Total Joint Exercises Ankle Circles/Pumps: AROM, Both, 10 reps, Supine    General Comments General comments (skin integrity, edema, etc.): no acute s/sx distress, PTA elevated her LLE in supine with pillow under knee/calf/thigh and KI still donned, middle of bed locked to prevent accidental knee flexion, pt/RN agreeable, alarm on for pt safety.      Pertinent Vitals/Pain Pain Assessment Pain Assessment: Faces Faces Pain Scale: Hurts little more Pain Location: left LE Pain Descriptors / Indicators: Discomfort, Dull, Operative site guarding Pain Intervention(s): Limited activity within patient's tolerance, Monitored during session, Repositioned    Home Living Family/patient expects to be discharged to:: Private residence Living Arrangements: Spouse/significant other Available Help at Discharge: Family;Available 24 hours/day Type of Home: House Home Access: Stairs to enter Entrance Stairs-Rails: Right Entrance Stairs-Number of Steps: 2   Home Layout: One level Home Equipment: Agricultural consultant (2 wheels);Cane - single point      Prior Function            PT Goals (current goals can now be found in the care plan section) Acute  Rehab PT Goals Patient Stated Goal: To get back home PT Goal Formulation: With patient Time For Goal Achievement: 12/01/23 Potential to Achieve Goals: Good Progress towards PT goals: Progressing toward goals    Frequency    Min 4X/week      PT Plan         AM-PAC PT "6 Clicks" Mobility   Outcome Measure  Help needed turning from your back to your side while in a flat bed without using bedrails?: A Little Help needed moving from lying on your back to sitting on the side of a flat bed without using bedrails?: A Little Help needed moving to and from a bed to a chair (including a wheelchair)?: A Lot Help needed standing up from a chair using your arms (e.g., wheelchair or bedside chair)?: A Lot Help needed to walk in hospital room?: Total (<98ft; difficulty with TWB precs) Help needed climbing 3-5 steps with a railing? : Total 6 Click Score: 12    End of Session Equipment Utilized During Treatment: Gait belt;Left knee immobilizer Activity Tolerance: Patient tolerated treatment well Patient left: in bed;with  call bell/phone within reach;with bed alarm set;Other (comment);with nursing/sitter in room (LLE elevated on pillow) Nurse Communication: Mobility status;Precautions;Weight bearing status;Other (comment) (+2 for transfers) PT Visit Diagnosis: Unsteadiness on feet (R26.81);Other abnormalities of gait and mobility (R26.89);History of falling (Z91.81)     Time: 1610-9604 PT Time Calculation (min) (ACUTE ONLY): 11 min  Charges:    $Therapeutic Activity: 8-22 mins PT General Charges $$ ACUTE PT VISIT: 1 Visit                     Enrica Corliss P., PTA Acute Rehabilitation Services Secure Chat Preferred 9a-5:30pm Office: [phone]    Mariel Shope [facility] 11/24/2023, 6:55 PM

## 2023-11-25 MED ORDER — SODIUM CHLORIDE 0.9 % IV BOLUS: 500.0000 mL | Freq: Once | INTRAVENOUS | Status: DC

## 2023-11-25 MED ORDER — LACTATED RINGERS IV BOLUS
1000.0000 mL | Freq: Once | INTRAVENOUS | Status: AC
  Administered 2023-11-25: 1000 mL via INTRAVENOUS

## 2023-11-25 MED ORDER — SODIUM CHLORIDE 0.9 % IV BOLUS: 1000.0000 mL | Freq: Once | INTRAVENOUS | Status: DC

## 2023-11-25 NOTE — TOC CAGE-AID Note (Signed)
 Transition of Care [facility]) - CAGE-AID Screening  Patient Details  Name: Cheryl [name] MRN: [id number] Date of Birth: [birth year]  Clinical Narrative:  Patient denies any current alcohol or drug use, no need to provide substance abuse resources at this time.  CAGE-AID Screening:   Have You Ever Felt You Ought to Cut Down on Your Drinking or Drug Use?: No Have People Annoyed You By Critizing Your Drinking Or Drug Use?: No Have You Felt Bad Or Guilty About Your Drinking Or Drug Use?: No Have You Ever Had a Drink or Used Drugs First Thing In The Morning to Steady Your Nerves or to Get Rid of a Hangover?: No CAGE-AID Score: 0  Substance Abuse Education Offered: No

## 2023-11-25 NOTE — Plan of Care (Signed)
  Problem: Elimination: Goal: Will not experience complications related to bowel motility Outcome: Not Progressing   Problem: Elimination: Goal: Will not experience complications related to urinary retention Outcome: Not Progressing   Problem: Pain Managment: Goal: General experience of comfort will improve and/or be controlled Outcome: Not Progressing   Problem: Safety: Goal: Ability to remain free from injury will improve Outcome: Not Progressing

## 2023-11-25 NOTE — Progress Notes (Signed)
   ORTHOPAEDIC PROGRESS NOTE  s/p Procedure(s): OPEN REDUCTION INTERNAL FIXATION (ORIF) DISTAL FEMUR FRACTURE  SUBJECTIVE: Reports mild pain about operative site.   No chest pain. No SOB. No nausea/vomiting. No other complaints.  OBJECTIVE: PE: General: sitting in hospital bed, NAD LLE: dressing CDI, knee immobilizer in place, intact EHL/TA/GSC, warm well perfused foot   Vitals:   11/24/23 2333 11/25/23 0325  BP: (!) 112/53 (!) 107/55  Pulse: 86 91  Resp: 18 18  Temp: 98.7 F (37.1 C) 97.9 F (36.6 C)  SpO2: 100% 97%     ASSESSMENT: Cheryl [name] is a 68 y.o. female doing well postoperatively. POD#2 Ambulated with PT to restroom multiple times. BP has been soft since admission, likely dehydration given AKI. Plan for 1L gentle LR today. Cr improving however BUN increasing so she is likely still dry. Baseline Cr 0.6-0.8, today 1.24. Recommend watching BP and hold hydrochlorothiazide today. Can resume home BP meds if AKI resolves. Consider medicine consult if ongoing low pressures and elevated Cr from baseline. Leukocytosis improving appropriately. History of diastolic dysfunction but normal EF so will be conscious of fluid intake. She is not having adequate output given dehydration.   PLAN: Weightbearing: TDWB LLE Insicional and dressing care: Reinforce dressings as needed Orthopedic device(s):  Knee immobilizer Showering: Hold for now VTE prophylaxis: Lovenox 30 mg Pain control: PRN pain medications, minimize narcotics as able Follow - up plan: 2 weeks in office Dispo: TBD. PT/OT evals Saturday.  Contact information:  After hours and holidays please check [email or web address] for group call information for [facility]

## 2023-11-25 NOTE — Evaluation (Signed)
 Occupational Therapy Evaluation Patient Details Name: Cheryl [name] MRN: [id number] DOB: 1955/09/01 Today's Date: 11/25/2023   History of Present Illness   Pt is a 68 y.o. female admitted 11/23/23 s/p fall with knee pain. Xray reveals left distal femur fx, underwent ORIF on 11/23/23.  PMH significant for L TKA w/ Dr. Abigail Abler in early January, HTN, obesity.     Clinical Impressions PTA, pt lives with spouse and typically Modified Independent with ADLs/mobility using a cane. Pt presents now with deficits in balance, strength, endurance and pain. Pt benefits from +2 assist to stand from lower surfaces with RW but once up, able to mobilize a few steps using RW with Min A(x 2 chair follow) w/ good adherence to WB precautions. Pt requires Setup for UB ADL and up to Mod A for LB ADLs. Pt may benefit from AE education in next sessions to maximize LB ADL independence. Pt reports her bathroom is not wheelchair accessible at home and would not be able to access it with current functional abilities. As AIR is not currently an option, recommend consideration of other postacute rehab facilities to maximize safety/independence with ADLs and mobility.     If plan is discharge home, recommend the following:   A lot of help with walking and/or transfers;A lot of help with bathing/dressing/bathroom     Functional Status Assessment   Patient has had a recent decline in their functional status and demonstrates the ability to make significant improvements in function in a reasonable and predictable amount of time.     Equipment Recommendations   Wheelchair cushion (measurements OT);Wheelchair (measurements OT);BSC/3in1 (+elevating footrests for wheelchair)     Recommendations for Other Services         Precautions/Restrictions   Precautions Precautions: Fall;Knee Recall of Precautions/Restrictions: Intact Precaution/Restrictions Comments: L knee NO ROM, KI at all times Required Braces or  Orthoses: Knee Immobilizer - Left Knee Immobilizer - Left: On at all times Restrictions Weight Bearing Restrictions Per Provider Order: Yes LLE Weight Bearing Per Provider Order: Touchdown weight bearing     Mobility Bed Mobility Overal bed mobility: Needs Assistance Bed Mobility: Supine to Sit     Supine to sit: Min assist     General bed mobility comments: Min A for LLE assist to EOB    Transfers Overall transfer level: Needs assistance Equipment used: Rolling walker (2 wheels) Transfers: Sit to/from Stand Sit to Stand: Mod assist, +2 physical assistance           General transfer comment: Mod A x 2 to stand from low bed height but once up, able to hop a few steps with RW with Min A +2 chair follow. Built up chair seat with folded blankets to ease transfer      Balance Overall balance assessment: Needs assistance Sitting-balance support: No upper extremity supported, Feet supported Sitting balance-Leahy Scale: Good     Standing balance support: Bilateral upper extremity supported, During functional activity, Reliant on assistive device for balance Standing balance-Leahy Scale: Poor Standing balance comment: reliant on RW given her precs                           ADL either performed or assessed with clinical judgement   ADL Overall ADL's : Needs assistance/impaired Eating/Feeding: Independent   Grooming: Set up;Sitting   Upper Body Bathing: Set up;Sitting   Lower Body Bathing: Moderate assistance;Sitting/lateral leans;Sit to/from stand   Upper Body Dressing : Set up;Sitting  Lower Body Dressing: Moderate assistance;Sit to/from stand;Sitting/lateral leans Lower Body Dressing Details (indicate cue type and reason): able to doff B socks sitting EOB, don R sock but required assist to don L sock EOB. may benefit from AE education in next sessions Toilet Transfer: Moderate assistance;Rolling walker (2 wheels);BSC/3in1 Toilet Transfer Details  (indicate cue type and reason): assist to stand but once up, able to transfer fairly well Toileting- Clothing Manipulation and Hygiene: Moderate assistance;Sitting/lateral lean;Sit to/from stand       Functional mobility during ADLs: Minimal assistance;+2 for safety/equipment;Rolling walker (2 wheels)       Vision Ability to See in Adequate Light: 0 Adequate Patient Visual Report: No change from baseline Vision Assessment?: No apparent visual deficits     Perception         Praxis         Pertinent Vitals/Pain Pain Assessment Pain Assessment: Faces Faces Pain Scale: Hurts a little bit Pain Location: left LE, L shoulder Pain Descriptors / Indicators: Discomfort, Dull, Operative site guarding Pain Intervention(s): Monitored during session, Premedicated before session     Extremity/Trunk Assessment Upper Extremity Assessment Upper Extremity Assessment: Overall WFL for tasks assessed;Right hand dominant;LUE deficits/detail LUE Deficits / Details: reports some L shoulder discomfort since fall but able to functionally reach and use RW   Lower Extremity Assessment Lower Extremity Assessment: Defer to PT evaluation   Cervical / Trunk Assessment Cervical / Trunk Assessment: Normal   Communication Communication Communication: No apparent difficulties   Cognition Arousal: Alert Behavior During Therapy: WFL for tasks assessed/performed Cognition: No apparent impairments                               Following commands: Intact       Cueing  General Comments   Cueing Techniques: Verbal cues;Gestural cues;Tactile cues      Exercises     Shoulder Instructions      Home Living Family/patient expects to be discharged to:: Private residence Living Arrangements: Spouse/significant other Available Help at Discharge: Family;Available 24 hours/day Type of Home: House Home Access: Stairs to enter [employer] of Steps: 2 Entrance Stairs-Rails:  Right Home Layout: One level     Bathroom Shower/Tub: Chief Strategy Officer: Standard     Home Equipment: Agricultural consultant (2 wheels);Cane - single point   Additional Comments: reports a wheelchair would not fit through bathroom doorway      Prior Functioning/Environment Prior Level of Function : Independent/Modified Independent             Mobility Comments: Modified indep with cane in recent weeks ADLs Comments: Indep with ADLs, IADLs    OT Problem List: Decreased strength;Decreased activity tolerance;Impaired balance (sitting and/or standing);Decreased knowledge of use of DME or AE;Decreased knowledge of precautions;Pain   OT Treatment/Interventions: Self-care/ADL training;Therapeutic exercise;Energy conservation;DME and/or AE instruction;Therapeutic activities;Patient/family education;Balance training      OT Goals(Current goals can be found in the care plan section)   Acute Rehab OT Goals Patient Stated Goal: agreeable for rehab OT Goal Formulation: With patient Time For Goal Achievement: 12/09/23 Potential to Achieve Goals: Good   OT Frequency:  Min 2X/week    Co-evaluation              AM-PAC OT "6 Clicks" Daily Activity     Outcome Measure Help from another person eating meals?: None Help from another person taking care of personal grooming?: A Little Help from another person  toileting, which includes using toliet, bedpan, or urinal?: A Lot Help from another person bathing (including washing, rinsing, drying)?: A Lot Help from another person to put on and taking off regular upper body clothing?: A Little Help from another person to put on and taking off regular lower body clothing?: A Lot 6 Click Score: 16   End of Session Equipment Utilized During Treatment: Gait belt;Rolling walker (2 wheels) Nurse Communication: Mobility status  Activity Tolerance: Patient tolerated treatment well Patient left: in chair;with call bell/phone within  reach;with chair alarm set  OT Visit Diagnosis: Unsteadiness on feet (R26.81);Other abnormalities of gait and mobility (R26.89);Muscle weakness (generalized) (M62.81)                Time: 4098-1191 OT Time Calculation (min): 20 min Charges:  OT General Charges $OT Visit: 1 Visit OT Evaluation $OT Eval Moderate Complexity: 1 Mod  Lawrence Pretty, OTR/L Acute Rehab Services Office: [phone]   Annabella Barr 11/25/2023, 10:26 AM

## 2023-11-26 ENCOUNTER — Encounter (HOSPITAL_COMMUNITY): Payer: Self-pay | Admitting: Orthopedic Surgery

## 2023-11-26 ENCOUNTER — Inpatient Hospital Stay (HOSPITAL_COMMUNITY)

## 2023-11-26 DIAGNOSIS — E861 Hypovolemia: Secondary | ICD-10-CM | POA: Diagnosis not present

## 2023-11-26 DIAGNOSIS — D62 Acute posthemorrhagic anemia: Secondary | ICD-10-CM

## 2023-11-26 DIAGNOSIS — N179 Acute kidney failure, unspecified: Secondary | ICD-10-CM | POA: Diagnosis not present

## 2023-11-26 LAB — BASIC METABOLIC PANEL WITH GFR
Anion gap: 12 (ref 5–15)
BUN: 18 mg/dL (ref 8–23)
CO2: 27 mmol/L (ref 22–32)
Calcium: 8.3 mg/dL — ABNORMAL LOW (ref 8.9–10.3)
Chloride: 96 mmol/L — ABNORMAL LOW (ref 98–111)
Creatinine, Ser: 1 mg/dL (ref 0.44–1.00)
GFR, Estimated: >60 mL/min (ref >60)
Glucose, Bld: 89 mg/dL (ref 70–99)
Potassium: 3.8 mmol/L (ref 3.5–5.1)
Sodium: 135 mmol/L (ref 135–145)

## 2023-11-26 LAB — CBC
HCT: 23.8 % — ABNORMAL LOW (ref 36.0–46.0)
Hemoglobin: 7.6 g/dL — ABNORMAL LOW (ref 12.0–15.0)
MCH: 28 pg (ref 26.0–34.0)
MCHC: 31.9 g/dL (ref 30.0–36.0)
MCV: 87.8 fL (ref 80.0–100.0)
Platelets: 281 10*3/uL (ref 150–400)
RBC: 2.71 MIL/uL — ABNORMAL LOW (ref 3.87–5.11)
RDW: 14.4 % (ref 11.5–15.5)
WBC: 6.7 10*3/uL (ref 4.0–10.5)
nRBC: 0 % (ref 0.0–0.2)

## 2023-11-26 LAB — ABO/RH: ABO/RH(D): O NEG

## 2023-11-26 LAB — PREPARE RBC (CROSSMATCH)

## 2023-11-26 MED ORDER — SODIUM CHLORIDE 0.9% IV SOLUTION: Freq: Once | INTRAVENOUS | Status: DC

## 2023-11-26 MED ORDER — LACTATED RINGERS IV BOLUS: 1000.0000 mL | Freq: Once | INTRAVENOUS | Status: DC

## 2023-11-26 MED ORDER — LACTATED RINGERS IV BOLUS
1000.0000 mL | Freq: Once | INTRAVENOUS | Status: AC
  Administered 2023-11-26: 1000 mL via INTRAVENOUS

## 2023-11-26 NOTE — Plan of Care (Signed)
  Problem: Activity: Goal: Risk for activity intolerance will decrease Outcome: Not Progressing   Problem: Elimination: Goal: Will not experience complications related to bowel motility Outcome: Not Progressing   Problem: Pain Managment: Goal: General experience of comfort will improve and/or be controlled Outcome: Not Progressing   Problem: Safety: Goal: Ability to remain free from injury will improve Outcome: Not Progressing

## 2023-11-26 NOTE — Progress Notes (Signed)
 RE:  Cheryl [name]       Date of Birth:  06/05/1956     Date:  11/26/23        To Whom It May Concern:  Please be advised that the above-named patient will require a short-term nursing home stay - anticipated 30 days or less for rehabilitation and strengthening.  The plan is for return home.                 MD signature                Date

## 2023-11-26 NOTE — Progress Notes (Signed)
 Subjective: Patient reports pain as {DEGREE - MILD, MOD, SEV:20224}.  Tolerating diet.   No CP, SOB.  Has mobilized OOB with PT/OT some. Dealing with some hypotension and AKI so trying to rehydrate and hold hydrochlorothiazide.   Objective:   VITALS:   Vitals:   11/25/23 2019 11/26/23 0626 11/26/23 0732 11/26/23 1400  BP: (!) 91/45 (!) 111/53 110/63 110/62  Pulse: 95 92 99 94  Resp: 17 17 17 16   Temp: 98.8 F (37.1 C) 98.7 F (37.1 C) 99 F (37.2 C) 98.2 F (36.8 C)  TempSrc: Oral Oral    SpO2: 99% 99% 96% 100%  Weight:      Height:          Latest Ref Rng & Units 11/23/2023    5:48 PM 11/23/2023    4:12 AM 11/22/2023    5:36 PM  CBC  WBC 4.0 - 10.5 K/uL 13.2  12.0    Hemoglobin 12.0 - 15.0 g/dL 9.8  40.9  81.1   Hematocrit 36.0 - 46.0 % 30.0  35.6  42.0   Platelets 150 - 400 K/uL 300  339        Latest Ref Rng & Units 11/23/2023    4:12 AM 11/22/2023    8:44 PM 11/22/2023    5:36 PM  BMP  Glucose 70 - 99 mg/dL 914  782  956   BUN 8 - 23 mg/dL 32  29  31   Creatinine 0.44 - 1.00 mg/dL 2.13  0.86  5.78   Sodium 135 - 145 mmol/L 136  136  136   Potassium 3.5 - 5.1 mmol/L 3.9  3.6  4.2   Chloride 98 - 111 mmol/L 103  101  105   CO2 22 - 32 mmol/L 23  24    Calcium 8.9 - 10.3 mg/dL 8.7  8.8     Intake/Output      04/13 0701 04/14 0700 04/14 0701 04/15 0700   P.O. 591 480   Total Intake(mL/kg) 591 (5.9) 480 (4.8)   Urine (mL/kg/hr) 1 (0)    Total Output 1    Net +590 +480        Urine Occurrence 6 x 2 x   Stool Occurrence  1 x      Physical Exam: General: NAD.  *** Resp: No increased wob Cardio: regular rate and rhythm ABD soft Neurologically intact MSK Neurovascularly intact Sensation intact distally Intact pulses distally Dorsiflexion/Plantar flexion intact Incision: dressing C/D/I KI in place ***  Assessment: 3 Days Post-Op  S/P Procedure(s) (LRB): OPEN REDUCTION INTERNAL FIXATION (ORIF) DISTAL FEMUR FRACTURE (Left) by Dr. Jewel Baize.  Murphy on 11/23/23  Principal Problem:   Closed fracture of right femur, unspecified fracture morphology, unspecified portion of femur, sequela   Plan: Will consult hospitalist for help managing the hypotension and AKI   Advance diet Up with therapy Incentive Spirometry Elevate and Apply ice  Weightbearing: TDWB LLE x 6 weeks Insicional and dressing care: Dressings left intact until follow-up and Reinforce dressings as needed Orthopedic device(s):  KI Showering: Keep dressing dry VTE prophylaxis:  Lovenox 30mg  daily while inpatient, will switch to ASA 81mg  bid x 30 days upon d/c  , SCDs, ambulation Pain control: PRN Follow - up plan: 2 weeks Contact information:  Margarita Rana MD, Levester Fresh PA-C  Dispo:  PT/OT recommended SNF. Awaiting bed placement. Will print and sign discharge scripts.     Cheryl Pane, PA-C Office [phone] 11/26/2023, 5:31 PM

## 2023-11-26 NOTE — Hospital Course (Signed)
Cr

## 2023-11-26 NOTE — Progress Notes (Signed)
 Physical Therapy Treatment Patient Details Name: Cheryl [name] MRN: [id number] DOB: [birth year] Today's Date: 11/26/2023   History of Present Illness Pt is a 68 y.o. female admitted 11/23/23 s/p fall with knee pain. Xray reveals left distal femur fx, underwent ORIF on 11/23/23.  PMH significant for L TKA w/ Dr. Abigail Abler in early January, HTN, obesity.    PT Comments  Continuing work on functional mobility and activity tolerance;  Session focused on funcitonal transfers and continuing work on gait, with particular attention to ability to maintain TWBing LLE; Notable improvemetns in sit to stand and ability to maintain TWB with very short distance amb in the room; Needs mor work on controlling descent to sit; Noting AIR is not an option; Patient will benefit from continued inpatient follow up therapy, <3 hours/day    If plan is discharge home, recommend the following: A little help with bathing/dressing/bathroom;Assistance with cooking/housework;Assist for transportation;Help with stairs or ramp for entrance;A lot of help with walking and/or transfers   Can travel by private vehicle        Equipment Recommendations  BSC/3in1;Wheelchair (measurements PT) (removable arm and leg rests, elevating leg rests, drop arm BSC)    Recommendations for Other Services       Precautions / Restrictions Precautions Precautions: Fall;Knee Precaution Booklet Issued: No Recall of Precautions/Restrictions: Intact Precaution/Restrictions Comments: L knee NO ROM, KI at all times Required Braces or Orthoses: Knee Immobilizer - Left Knee Immobilizer - Left: On at all times Restrictions LLE Weight Bearing Per Provider Order: Touchdown weight bearing     Mobility  Bed Mobility Overal bed mobility: Needs Assistance Bed Mobility: Supine to Sit     Supine to sit: Contact guard     General bed mobility comments: Contact guard for safety    Transfers Overall transfer level: Needs assistance Equipment  used: Rolling walker (2 wheels) Transfers: Sit to/from Stand, Bed to chair/wheelchair/BSC Sit to Stand: Mod assist, Min assist Stand pivot transfers: +2 safety/equipment, Min assist         General transfer comment: Mod assist to stand from bed; min assist to stand from recliner using bil armrests to push up from; Able to perfomr "heel-toe" pivot "steps" bed to recliner with RW and min assist (second person for safety); noting dec control of descent ` with stand to sit    Ambulation/Gait Ambulation/Gait assistance: Min assist, +2 safety/equipment (chair follow) Gait Distance (Feet): 8 Feet Assistive device: Rolling walker (2 wheels) Gait Pattern/deviations: Step-to pattern       General Gait Details: Step by step cues for sequence, safety, and TWB LE   Stairs             Wheelchair Mobility     Tilt Bed    Modified Rankin (Stroke Patients Only)       Balance     Sitting balance-Leahy Scale: Good       Standing balance-Leahy Scale: Poor Standing balance comment: reliant on RW given her precs                            Communication Communication Communication: No apparent difficulties  Cognition Arousal: Alert Behavior During Therapy: WFL for tasks assessed/performed   PT - Cognitive impairments: No apparent impairments                         Following commands: Intact      Cueing Cueing Techniques: Verbal cues, Gestural  cues, Tactile cues  Exercises      General Comments General comments (skin integrity, edema, etc.): Adjusted KI's velcro side bars for better comfort      Pertinent Vitals/Pain Pain Assessment Pain Assessment: Faces Faces Pain Scale: Hurts a little bit Pain Location: left LE, L shoulder Pain Descriptors / Indicators: Discomfort, Dull, Operative site guarding Pain Intervention(s): Monitored during session    Home Living                          Prior Function            PT Goals  (current goals can now be found in the care plan section) Acute Rehab PT Goals Patient Stated Goal: To get back home PT Goal Formulation: With patient Time For Goal Achievement: 12/01/23 Potential to Achieve Goals: Good Progress towards PT goals: Progressing toward goals    Frequency    Min 4X/week      PT Plan      Co-evaluation              AM-PAC PT "6 Clicks" Mobility   Outcome Measure  Help needed turning from your back to your side while in a flat bed without using bedrails?: A Little Help needed moving from lying on your back to sitting on the side of a flat bed without using bedrails?: None Help needed moving to and from a bed to a chair (including a wheelchair)?: A Lot Help needed standing up from a chair using your arms (e.g., wheelchair or bedside chair)?: A Lot Help needed to walk in hospital room?: A Lot Help needed climbing 3-5 steps with a railing? : Total 6 Click Score: 14    End of Session Equipment Utilized During Treatment: Gait belt;Left knee immobilizer Activity Tolerance: Patient tolerated treatment well Patient left: in chair;with call bell/phone within reach;with chair alarm set Nurse Communication: Mobility status PT Visit Diagnosis: Unsteadiness on feet (R26.81);Other abnormalities of gait and mobility (R26.89);History of falling (Z91.81)     Time: 3244-0102 PT Time Calculation (min) (ACUTE ONLY): 34 min  Charges:    $Gait Training: 8-22 mins $Therapeutic Activity: 8-22 mins PT General Charges $$ ACUTE PT VISIT: 1 Visit                     Darcus Eastern, PT  Acute Rehabilitation Services Office [phone] Secure Chat welcomed    Marcial Setting 11/26/2023, 1:27 PM

## 2023-11-26 NOTE — NC FL2 (Signed)
 [place]  MEDICAID FL2 LEVEL OF CARE FORM     IDENTIFICATION  Patient Name: Cheryl [name] Birthdate: [birth year] Sex: female Admission Date (Current Location): 11/22/2023  [facility] and [place] Number:  Producer, television/film/video and Address:  [facility], [address] [place], [place] [address]      Provider Number: [id number]  Attending Physician Name and Address:  Saundra Curl, MD  Relative Name and Phone Number:  Aviv, Rota" Spouse   [phone]    Current Level of Care: Hospital Recommended Level of Care: Skilled Nursing Facility Prior Approval Number:    Date Approved/Denied:   PASRR Number:    Discharge Plan: SNF    Current Diagnoses: Patient Active Problem List   Diagnosis Date Noted   Closed fracture of right femur, unspecified fracture morphology, unspecified portion of femur, sequela 11/22/2023   Left knee pain 07/31/2023   Other insomnia 04/04/2023   BMI 36.0-36.9,adult 10/18/2022   Obesity, Beginning BMI 45.35 10/18/2022   BMI 37.0-37.9, adult 09/20/2022   Obesity, Beginning BMI 45.35 09/20/2022   Vitamin D deficiency 04/13/2022   Pure hypercholesterolemia 04/13/2022   Class 3 severe obesity with serious comorbidity and body mass index (BMI) of 45.0 to 49.9 in adult (HCC) 04/13/2022   Osteoarthritis of right knee 12/27/2021   Depression 04/10/2019   Insulin resistance 04/09/2019   TSH elevation 04/09/2019   Prediabetes 05/30/2018   Other fatigue 02/06/2018   Shortness of breath on exertion 02/06/2018   Essential hypertension 02/06/2018   Sleep apnea    Urinary incontinence    HTN (hypertension) 01/05/2012   Osteopenia    Chest pain, central 01/06/2011   Obesity     Orientation RESPIRATION BLADDER Height & Weight     Self, Time, Situation, Place  Normal Continent Weight: 222 lb 10.6 oz (101 kg) Height:  5\' 6"  (167.6 cm)  BEHAVIORAL SYMPTOMS/MOOD NEUROLOGICAL BOWEL NUTRITION STATUS      Continent  Diet (see discharge summary)  AMBULATORY STATUS COMMUNICATION OF NEEDS Skin   Limited Assist Verbally Surgical wounds                       Personal Care Assistance Level of Assistance  Bathing, Feeding, Dressing Bathing Assistance: Limited assistance Feeding assistance: Independent Dressing Assistance: Limited assistance     Functional Limitations Info  Sight, Hearing, Speech Sight Info: Adequate Hearing Info: Adequate Speech Info: Adequate    SPECIAL CARE FACTORS FREQUENCY  PT (By licensed PT), OT (By licensed OT)     PT Frequency: 5x week OT Frequency: 5x week            Contractures Contractures Info: Not present    Additional Factors Info  Code Status, Allergies Code Status Info: full Allergies Info: Ace Inhibitors, Lisinopril           Current Medications (11/26/2023):  This is the current hospital active medication list Current Facility-Administered Medications  Medication Dose Route Frequency Provider Last Rate Last Admin   acetaminophen (TYLENOL) tablet 325-650 mg  325-650 mg Oral Q6H PRN Gawne, Meghan M, PA-C       alum & mag hydroxide-simeth (MAALOX/MYLANTA) 200-200-20 MG/5ML suspension 30 mL  30 mL Oral Q4H PRN Gawne, Meghan M, PA-C       bisacodyl (DULCOLAX) suppository 10 mg  10 mg Rectal Daily PRN Gawne, Meghan M, PA-C   10 mg at 11/26/23 5621   buPROPion ER (WELLBUTRIN SR) 12 hr tablet 200 mg  200  mg Oral Daily Gawne, Meghan M, PA-C   200 mg at 11/26/23 0757   cholecalciferol (VITAMIN D3) 25 MCG (1000 UNIT) tablet 1,000 Units  1,000 Units Oral Daily Gawne, Meghan M, PA-C   1,000 Units at 11/26/23 0757   docusate sodium (COLACE) capsule 100 mg  100 mg Oral BID Gawne, Meghan M, PA-C   100 mg at 11/26/23 0757   enoxaparin (LOVENOX) injection 30 mg  30 mg Subcutaneous Q24H Gawne, Meghan M, PA-C   30 mg at 11/26/23 0757   HYDROmorphone (DILAUDID) injection 0.5-1 mg  0.5-1 mg Intravenous Q4H PRN Gawne, Meghan M, PA-C   0.5 mg at 11/25/23 1957    losartan (COZAAR) tablet 100 mg  100 mg Oral Daily Gawne, Meghan M, PA-C   100 mg at 11/26/23 1610   menthol-cetylpyridinium (CEPACOL) lozenge 3 mg  1 lozenge Oral PRN Gawne, Meghan M, PA-C       Or   phenol (CHLORASEPTIC) mouth spray 1 spray  1 spray Mouth/Throat PRN Gawne, Meghan M, PA-C       metFORMIN (GLUCOPHAGE) tablet 500 mg  500 mg Oral BID WC Gawne, Meghan M, PA-C   500 mg at 11/26/23 0757   methocarbamol (ROBAXIN) tablet 500 mg  500 mg Oral Q6H PRN Gawne, Meghan M, PA-C   500 mg at 11/25/23 1626   Or   methocarbamol (ROBAXIN) injection 500 mg  500 mg Intravenous Q6H PRN Gawne, Meghan M, PA-C       metoCLOPramide (REGLAN) tablet 5-10 mg  5-10 mg Oral Q8H PRN Gawne, Meghan M, PA-C       Or   metoCLOPramide (REGLAN) injection 5-10 mg  5-10 mg Intravenous Q8H PRN Gawne, Meghan M, PA-C       ondansetron (ZOFRAN) tablet 4 mg  4 mg Oral Q6H PRN Gawne, Meghan M, PA-C       Or   ondansetron (ZOFRAN) injection 4 mg  4 mg Intravenous Q6H PRN Gawne, Meghan M, PA-C       oxyCODONE (Oxy IR/ROXICODONE) immediate release tablet 10 mg  10 mg Oral Q4H PRN Gawne, Meghan M, PA-C   10 mg at 11/26/23 9604   oxyCODONE (Oxy IR/ROXICODONE) immediate release tablet 5 mg  5 mg Oral Q4H PRN Gawne, Meghan M, PA-C   5 mg at 11/25/23 1626   pantoprazole (PROTONIX) EC tablet 40 mg  40 mg Oral Daily Gawne, Meghan M, PA-C   40 mg at 11/26/23 0758   polyethylene glycol (MIRALAX / GLYCOLAX) packet 17 g  17 g Oral Daily PRN Gawne, Meghan M, PA-C   17 g at 11/26/23 0757   venlafaxine (EFFEXOR) tablet 75 mg  75 mg Oral Daily Gawne, Meghan M, PA-C   75 mg at 11/26/23 5409     Discharge Medications: Please see discharge summary for a list of discharge medications.  Relevant Imaging Results:  Relevant Lab Results:   Additional Information SSN: [id number]  Elspeth Hals, LCSW

## 2023-11-26 NOTE — Care Management Important Message (Signed)
 Important Message  Patient Details  Name: Cheryl [name] MRN: [id number] Date of Birth: Feb 02, 1956   Important Message Given:  Yes - Medicare IM     Felix Host 11/26/2023, 3:32 PM

## 2023-11-26 NOTE — Anesthesia Postprocedure Evaluation (Signed)
 Anesthesia Post Note  Patient: Cheryl [name]  Procedure(s) Performed: OPEN REDUCTION INTERNAL FIXATION (ORIF) DISTAL FEMUR FRACTURE (Left)     Patient location during evaluation: PACU Anesthesia Type: General Level of consciousness: awake and alert Pain management: pain level controlled Vital Signs Assessment: post-procedure vital signs reviewed and stable Respiratory status: spontaneous breathing, nonlabored ventilation, respiratory function stable and patient connected to nasal cannula oxygen Cardiovascular status: blood pressure returned to baseline and stable Postop Assessment: no apparent nausea or vomiting Anesthetic complications: no   No notable events documented.  Last Vitals:  Vitals:   11/26/23 0626 11/26/23 0732  BP: (!) 111/53 110/63  Pulse: 92 99  Resp: 17 17  Temp: 37.1 C 37.2 C  SpO2: 99% 96%    Last Pain:  Vitals:   11/26/23 0742  TempSrc:   PainSc: 0-No pain                 [name] S

## 2023-11-26 NOTE — Consult Note (Signed)
 Initial Consultation Note   Patient: Cheryl [name] ZOX:[id number] DOB: Jul 08, 1956 PCP: Alveria Apley, NP DOA: 11/22/2023 DOS: the patient was seen and examined on 11/26/2023 Primary service: Sheral Apley, MD  Referring physician: Margarita Rana, MD Reason for consult: AKI  Assessment/Plan: Assessment and Plan: No notes have been filed under this hospital service. Service: Hospitalist  AKI - resolved Patient with baseline Cr ~0.9. On admission, Cr 1.60. She has been receiving IVF rehydration with mild gradual improvement in kidney function (1.6 > 1.36 > 1.24). On my exam, she does appear volume deplete. Will provide 1L IVF bolus for further rehydration. Obtain renal ultrasound to rule out obstructive uropathy. BMP today showing improvement in creatinine to 1.0. Renal ultrasound already pending while awaiting BMP to result so will follow up. [facility] hospitalist will sign off given resolution of AKI.  -1L IVF bolus -f/u renal ultrasound -trend kidney function -monitor UOP -avoid nephrotoxic medications as able  Hypotension Hx of HTN BP ranging in the 90s-110s/50s-60s. Her home hydrochlorothiazide has already been stopped by primary team. Will also stop her losartan today to avoid worsening hypotension. Providing IVF rehydration given she looks dry on exam, hopeful this will also help improve her BP. -1L LR bolus -holding home hydrochlorothiazide -discontinued losartan today -trend BP curve, goal MAP >65 -other management per primary team  Post-op anemia Patient with hgb 13.5 (4/10) > 11.7 (4/11) > 9.8 (4/11) > 7.6 (4/14). No overt bleeding noted. Discussed with orthopedics, suspect 2/2 perioperative blood loss. Discussed with ortho, recommended 1u pRBCs given precipitous drop. I have discontinued lovenox (for dvt ppx).  -ordered 1u pRBC -check post-transfusion H&H -discontinued lovenox -management per primary team  Prediabetes -continue home metformin 500mg   BID  HLD -not on any medications for this at home  Depression -continue home wellbutrin and venlafaxine  Left distal femur fracture -s/p ORIF on 4/11 with Dr. Eulah Pont -management per primary team   [facility] consulted to assist with AKI, which has resolved. [facility] will sign off at present, please call us again when needed.  HPI: Cheryl [name] is a 68 y.o. female with past medical history of HTN, HLD, prediabetes, obesity, depression who presented to the ED after a fall with injury to left knee. She was found to have a left distal femur fracture and was admitted by orthopedics. She underwent ORIF of distal femur fracture on 4/11 with Dr. Eulah Pont. Post-op course has been complicated by mild hypotension and AKI. Orthopedics requested medicine consult for management of these two issues.  Patient reports that she has been drinking less fluids since surgery and feels like her mouth is very dry. Has been producing urine about 2-3 times per day, unable to clarify color of urine. She has otherwise been doing fine post-op. No other complaints noted.  Review of Systems: As mentioned in the history of present illness. All other systems reviewed and are negative. Past Medical History:  Diagnosis Date   Allergy    Arthritis    Chest pain    Depression    Fatigue    GERD (gastroesophageal reflux disease)    Hyperlipidemia    Hypertension    Joint pain    Obesity    Osteopenia    Palpitation    Sleep apnea    uses CPAP    SOB (shortness of breath)    Urinary incontinence    Past Surgical History:  Procedure Laterality Date   ABDOMINAL HYSTERECTOMY  08/10/2000   TAH/RSO   BREAST SURGERY  Biopsy-Benign   COLOSTOMY  2000   Dr.Patterson   KNEE ARTHROSCOPY Bilateral    LAPAROSCOPIC CHOLECYSTECTOMY  2010   LAPAROTOMY  1990   Fibroid removed-ovarian cyst    ORIF FEMUR FRACTURE Left 11/23/2023   Procedure: OPEN REDUCTION INTERNAL FIXATION (ORIF) DISTAL FEMUR FRACTURE;  Surgeon: Sheral Apley, MD;  Location: [facility] OR;  Service: Orthopedics;  Laterality: Left;   RETINAL DETACHMENT SURGERY Right    It was torn   RHINOPLASTY     Social History:  reports that she quit smoking about 16 years ago. Her smoking use included cigarettes. She has a 5 pack-year smoking history. She has never used smokeless tobacco. She reports current alcohol use of about 5.0 standard drinks of alcohol per week. She reports that she does not use drugs.  Allergies  Allergen Reactions   Ace Inhibitors Cough   Lisinopril Cough    Family History  Problem Relation Age of Onset   Breast cancer Mother        Age 68   Hypertension Mother    Depression Mother    Arthritis Mother    Cancer Mother    COPD Mother    Hypertension Father    Arthritis Father    Heart disease Father    Cancer Father        Spleen   Sleep apnea Father    Obesity Paternal Aunt    Obesity Paternal Uncle    Hearing loss Maternal Grandmother    Heart disease Maternal Grandfather    Colon cancer Paternal Grandfather        Colon cancer   Stomach cancer Neg Hx    Colon polyps Neg Hx    Rectal cancer Neg Hx    Esophageal cancer Neg Hx     Prior to Admission medications   Medication Sig Start Date End Date Taking? Authorizing Provider  acetaminophen (TYLENOL) 650 MG CR tablet Take 650 mg by mouth daily at 6 (six) AM.   Yes [provider]  buPROPion (WELLBUTRIN SR) 200 MG 12 hr tablet Take 1 tablet (200 mg total) by mouth daily. Patient taking differently: Take 200 mg by mouth in the morning. 09/18/23  Yes Beasley, Caren D, MD  docusate sodium (COLACE) 100 MG capsule Take 100 mg by mouth in the morning.   Yes [provider]  Loratadine (CLARITIN PO) Take 1 tablet by mouth in the morning.   Yes [provider]  losartan-hydrochlorothiazide (HYZAAR) 100-12.5 MG tablet Take 1 tablet by mouth daily. Patient taking differently: Take 1 tablet by mouth in the morning. 09/18/23  Yes Quillian Quince D, MD   metFORMIN (GLUCOPHAGE) 500 MG tablet Take 1 tablet (500 mg total) by mouth 2 (two) times daily with a meal. 09/18/23  Yes Beasley, Caren D, MD  UNABLE TO FIND C PAP for sleep apnea    Yes [provider]  venlafaxine (EFFEXOR) 75 MG tablet Take 1 tablet (75 mg total) by mouth daily. Patient taking differently: Take 75 mg by mouth in the morning. 09/18/23  Yes Quillian Quince D, MD  aspirin 81 MG chewable tablet Chew 81 mg by mouth daily.    [provider]    Physical Exam: Vitals:   11/25/23 2019 11/26/23 0626 11/26/23 0732 11/26/23 1400  BP: (!) 91/45 (!) 111/53 110/63 110/62  Pulse: 95 92 99 94  Resp: 17 17 17 16   Temp: 98.8 F (37.1 C) 98.7 F (37.1 C) 99 F (37.2 C) 98.2 F (36.8  C)  TempSrc: Oral Oral    SpO2: 99% 99% 96% 100%  Weight:      Height:       Physical Exam Constitutional:      Appearance: Normal appearance. She is obese. She is not ill-appearing.  HENT:     Head: Normocephalic and atraumatic.     Mouth/Throat:     Mouth: Mucous membranes are dry.     Pharynx: Oropharynx is clear. No oropharyngeal exudate.  Eyes:     General: No scleral icterus.    Extraocular Movements: Extraocular movements intact.     Conjunctiva/sclera: Conjunctivae normal.     Pupils: Pupils are equal, round, and reactive to light.  Cardiovascular:     Rate and Rhythm: Normal rate and regular rhythm.     Heart sounds: Normal heart sounds. No murmur heard.    No friction rub. No gallop.  Pulmonary:     Effort: Pulmonary effort is normal. No respiratory distress.     Breath sounds: Normal breath sounds. No wheezing, rhonchi or rales.  Abdominal:     General: Bowel sounds are normal. There is no distension.     Palpations: Abdomen is soft.     Tenderness: There is no abdominal tenderness. There is no guarding or rebound.  Musculoskeletal:     Cervical back: Normal range of motion.     Comments: Left knee immobilizer in place.   Skin:    General: Skin is warm and  dry.  Neurological:     General: No focal deficit present.     Mental Status: She is alert and oriented to person, place, and time.  Psychiatric:        Mood and Affect: Mood normal.        Behavior: Behavior normal.     Data Reviewed:   Results are pending, will review when available.       Latest Ref Rng & Units 11/26/2023    3:48 PM 11/23/2023    5:48 PM 11/23/2023    4:12 AM  CBC  WBC 4.0 - 10.5 K/uL 6.7  13.2  12.0   Hemoglobin 12.0 - 15.0 g/dL 7.6  9.8  86.5   Hematocrit 36.0 - 46.0 % 23.8  30.0  35.6   Platelets 150 - 400 K/uL 281  300  339       Latest Ref Rng & Units 11/26/2023    3:48 PM 11/23/2023    4:12 AM 11/22/2023    8:44 PM  BMP  Glucose 70 - 99 mg/dL 89  784  696   BUN 8 - 23 mg/dL 18  32  29   Creatinine 0.44 - 1.00 mg/dL 2.95  2.84  1.32   Sodium 135 - 145 mmol/L 135  136  136   Potassium 3.5 - 5.1 mmol/L 3.8  3.9  3.6   Chloride 98 - 111 mmol/L 96  103  101   CO2 22 - 32 mmol/L 27  23  24    Calcium 8.9 - 10.3 mg/dL 8.3  8.7  8.8     Family Communication: no family at bedside Primary team communication: updated primary care team Thank you very much for involving [name] in the care of your patient.  Author: Briscoe Burns, MD 11/26/2023 2:59 PM  For on call review [email or web address].

## 2023-11-26 NOTE — TOC Initial Note (Addendum)
 Transition of Care [facility] - Initial/Assessment Note    Patient Details  Name: Cheryl [name] MRN: [id number] Date of Birth: 24-Oct-1955  Transition of Care [facility]) CM/SW Contact:    Elspeth Hals, LCSW Phone Number: 11/26/2023, 11:07 AM  Clinical Narrative:     CSW met with pt regarding DC plan.  Discussed CIR not able to admit, pt OK with DC plan for SNF.  Medicare choice document provided, permission given to send out referral in hub.  No SNF choice preference indicated.  Pt from home with husband Debbora Fair, no current services.  Permission given to speak with husband and daughter Nellie Banas.  Referral sent out in hub for SNF.               PASSR requires additional info-docs uploaded.  1200: bed offers provided to pt, she is requesting response from [facility] and [facility].  CSW reached out to those facilities.  1415: Pt now has bed offers at [facility] and [facility].  [facility] has not offered.  Pt updated, does want to hear from [facility].    Expected Discharge Plan: Skilled Nursing Facility Barriers to Discharge: Continued Medical Work up, SNF Pending bed offer   Patient Goals and CMS Choice Patient states their goals for this hospitalization and ongoing recovery are:: walk again CMS Medicare.gov Compare Post Acute Care list provided to:: Patient Choice offered to / list presented to : Patient      Expected Discharge Plan and Services In-house Referral: Clinical Social Work   Post Acute Care Choice: Skilled Nursing Facility Living arrangements for the past 2 months: Single Family Home                                      Prior Living Arrangements/Services Living arrangements for the past 2 months: Single Family Home Lives with:: Spouse Patient language and need for interpreter reviewed:: Yes Do you feel safe going back to the place where you live?: Yes      Need for Family Participation in Patient Care: No (Comment) Care giver support system in place?:  Yes (comment) Current home services: Other (comment) (none) Criminal Activity/Legal Involvement Pertinent to Current Situation/Hospitalization: No - Comment as needed  Activities of Daily Living   ADL Screening (condition at time of admission) Independently performs ADLs?: No Does the patient have a NEW difficulty with bathing/dressing/toileting/self-feeding that is expected to last >3 days?: Yes (Initiates electronic notice to provider for possible OT consult) Does the patient have a NEW difficulty with getting in/out of bed, walking, or climbing stairs that is expected to last >3 days?: Yes (Initiates electronic notice to provider for possible PT consult) Does the patient have a NEW difficulty with communication that is expected to last >3 days?: No Is the patient deaf or have difficulty hearing?: No Does the patient have difficulty seeing, even when wearing glasses/contacts?: No Does the patient have difficulty concentrating, remembering, or making decisions?: No  Permission Sought/Granted Permission sought to share information with : Family Supports Permission granted to share information with : Yes, Verbal Permission Granted  Share Information with NAME: husband Debbora Fair, daughter Nellie Banas  Permission granted to share info w AGENCY: SNF        Emotional Assessment Appearance:: Appears stated age Attitude/Demeanor/Rapport: Engaged Affect (typically observed): Appropriate, Pleasant Orientation: : Oriented to Self, Oriented to Place, Oriented to  Time, Oriented to Situation      Admission  diagnosis:  AKI (acute kidney injury) (HCC) [N17.9] Closed fracture of right femur, unspecified fracture morphology, unspecified portion of femur, sequela [S72.91XS] Patient Active Problem List   Diagnosis Date Noted   Closed fracture of right femur, unspecified fracture morphology, unspecified portion of femur, sequela 11/22/2023   Left knee pain 07/31/2023   Other insomnia 04/04/2023   BMI  36.0-36.9,adult 10/18/2022   Obesity, Beginning BMI 45.35 10/18/2022   BMI 37.0-37.9, adult 09/20/2022   Obesity, Beginning BMI 45.35 09/20/2022   Vitamin D deficiency 04/13/2022   Pure hypercholesterolemia 04/13/2022   Class 3 severe obesity with serious comorbidity and body mass index (BMI) of 45.0 to 49.9 in adult [facility]) 04/13/2022   Osteoarthritis of right knee 12/27/2021   Depression 04/10/2019   Insulin resistance 04/09/2019   TSH elevation 04/09/2019   Prediabetes 05/30/2018   Other fatigue 02/06/2018   Shortness of breath on exertion 02/06/2018   Essential hypertension 02/06/2018   Sleep apnea    Urinary incontinence    HTN (hypertension) 01/05/2012   Osteopenia    Chest pain, central 01/06/2011   Obesity    PCP:  Francenia Ingle, NP Pharmacy:   [id number] [place], [place] - [address] AT [facility] OF [facility] [address] [place] [address] Phone: [phone] Fax: [phone]  [facility] - [place], [place] - [address] [place] [address] Phone: [phone] Fax: [phone]     Social Drivers of Health (SDOH) Social History: SDOH Screenings   Food Insecurity: No Food Insecurity (11/23/2023)  Housing: Low Risk  (11/23/2023)  Transportation Needs: No Transportation Needs (11/23/2023)  Utilities: Not At Risk (11/23/2023)  Alcohol Screen: Low Risk  (06/26/2023)  Depression (PHQ2-9): Low Risk  (06/26/2023)  Financial Resource Strain: Low Risk  (06/26/2023)  Physical Activity: Insufficiently Active (06/26/2023)  Social Connections: Socially Integrated (11/23/2023)  Stress: No Stress Concern Present (06/26/2023)  Recent Concern: Stress - Stress Concern Present (05/20/2023)  Tobacco Use: Medium Risk (11/23/2023)  Health Literacy: Adequate Health Literacy (06/26/2023)   SDOH Interventions:     Readmission Risk Interventions     No data to display

## 2023-11-27 LAB — TYPE AND SCREEN
ABO/RH(D): O NEG
Antibody Screen: NEGATIVE
Unit division: 0

## 2023-11-27 LAB — HEMOGLOBIN AND HEMATOCRIT, BLOOD
HCT: 26.2 % — ABNORMAL LOW (ref 36.0–46.0)
Hemoglobin: 8.7 g/dL — ABNORMAL LOW (ref 12.0–15.0)

## 2023-11-27 LAB — BPAM RBC
Blood Product Expiration Date: 202505022359
ISSUE DATE / TIME: 202504142046
Unit Type and Rh: 9500

## 2023-11-27 MED ORDER — ASPIRIN 81 MG PO TBEC: 81.0000 mg | DELAYED_RELEASE_TABLET | Freq: Two times a day (BID) | ORAL | 0 refills | Status: AC

## 2023-11-27 MED ORDER — ACETAMINOPHEN 500 MG PO TABS: 1000.0000 mg | ORAL_TABLET | Freq: Four times a day (QID) | ORAL | 0 refills | Status: AC | PRN

## 2023-11-27 MED ORDER — ONDANSETRON 4 MG PO TBDP: 4.0000 mg | ORAL_TABLET | Freq: Three times a day (TID) | ORAL | 0 refills | Status: AC | PRN

## 2023-11-27 MED ORDER — MELOXICAM 15 MG PO TABS: 15.0000 mg | ORAL_TABLET | Freq: Every day | ORAL | 0 refills | Status: DC | PRN

## 2023-11-27 MED ORDER — OXYCODONE HCL 5 MG PO TABS: 5.0000 mg | ORAL_TABLET | ORAL | 0 refills | Status: DC | PRN

## 2023-11-27 MED ORDER — METHOCARBAMOL 750 MG PO TABS: 750.0000 mg | ORAL_TABLET | Freq: Three times a day (TID) | ORAL | 0 refills | Status: AC | PRN

## 2023-11-27 NOTE — Progress Notes (Addendum)
 Subjective: Patient reports pain as mild to moderate.  Tolerating diet.  Urinating.   No CP, SOB.  Has mobilized OOB with PT/OT.   BP still on the soft side despite hydrating patient and holding hydrochlorothiazide. Renal U/S ordered by hospitalist is normal. AKI has resolved. Received 1 unit of blood yesterday. Still feels fatigued and "out of sorts". Doesn't seem delirious.   Objective:   VITALS:   Vitals:   11/26/23 2124 11/26/23 2345 11/27/23 0443 11/27/23 0740  BP: (!) 123/53 (!) 113/59 (!) 98/57 105/67  Pulse: 82 88 74 84  Resp: 16 16 18 18   Temp: 98.8 F (37.1 C) 98.7 F (37.1 C) 98.8 F (37.1 C) 97.6 F (36.4 C)  TempSrc:  Oral Oral Oral  SpO2:  96% 93% 98%  Weight:      Height:          Latest Ref Rng & Units 11/26/2023    3:48 PM 11/23/2023    5:48 PM 11/23/2023    4:12 AM  CBC  WBC 4.0 - 10.5 K/uL 6.7  13.2  12.0   Hemoglobin 12.0 - 15.0 g/dL 7.6  9.8  16.1   Hematocrit 36.0 - 46.0 % 23.8  30.0  35.6   Platelets 150 - 400 K/uL 281  300  339       Latest Ref Rng & Units 11/26/2023    3:48 PM 11/23/2023    4:12 AM 11/22/2023    8:44 PM  BMP  Glucose 70 - 99 mg/dL 89  096  045   BUN 8 - 23 mg/dL 18  32  29   Creatinine 0.44 - 1.00 mg/dL 4.09  8.11  9.14   Sodium 135 - 145 mmol/L 135  136  136   Potassium 3.5 - 5.1 mmol/L 3.8  3.9  3.6   Chloride 98 - 111 mmol/L 96  103  101   CO2 22 - 32 mmol/L 27  23  24    Calcium 8.9 - 10.3 mg/dL 8.3  8.7  8.8    Intake/Output      04/14 0701 04/15 0700 04/15 0701 04/16 0700   P.O. 720 240   Blood 595    Total Intake(mL/kg) 1315 (13) 240 (2.4)   Urine (mL/kg/hr)     Total Output     Net +1315 +240        Urine Occurrence 5 x 1 x   Stool Occurrence 1 x       Physical Exam: General: NAD.  Sitting reclined in bedside chair, calm, comfortable, talkative Resp: No increased wob Cardio: regular rate and rhythm ABD soft Neurologically intact MSK Neurovascularly intact Sensation intact distally Intact pulses  distally Dorsiflexion/Plantar flexion intact Incision: dressing C/D/I KI in place  Assessment: 4 Days Post-Op  S/P Procedure(s) (LRB): OPEN REDUCTION INTERNAL FIXATION (ORIF) DISTAL FEMUR FRACTURE (Left) by Dr. Jewel Baize. Eulah Pont on 11/23/23  Principal Problem:   Closed fracture of right femur, unspecified fracture morphology, unspecified portion of femur, sequela   Plan: Hospitalist saw patient and felt she was dry so ordered fluids, continued to hold hydrochlorothiazide, ordered 1 unit PRBCs, and stopped Lovenox  Post transfusion Hgb 7.6 ----> 8.7   Advance diet Up with therapy Incentive Spirometry Elevate and Apply ice  Weightbearing: TDWB LLE x 6 weeks Insicional and dressing care: Dressings left intact until follow-up and Reinforce dressings as needed Orthopedic device(s):  KI Showering: Keep dressing dry VTE prophylaxis: has been on Lovenox 30mg  daily while inpatient  but stopped currently, will switch to ASA 81mg  bid x 30 days upon d/c, SCDs, ambulation Pain control: PRN Follow - up plan: 2 weeks Contact information:  Randal Bury MD, Marzella Solan PA-C  Dispo: Skilled Nursing Facility/Rehab once ready to accept patient. Will print and sign d/c scripts.      Lore Rode, PA-C Office [phone] 11/27/2023, 10:30 AM

## 2023-11-27 NOTE — TOC Progression Note (Addendum)
 Transition of Care [facility]) - Progression Note    Patient Details  Name: Cheryl [name] MRN: [id number] Date of Birth: March 14, 1956  Transition [facility]) CM/SW Contact  Elspeth Hals, LCSW Phone Number: 11/27/2023, 9:45 AM  Clinical Narrative:   PASSR received: [id number] A  CSW spoke with pt, who does want to accept offer at [facility].  [facility] informed.  DC date not clear currently.  Still needs auth.   1400: SNF auth request submitted in Navi and approved: [id number], 3 days: 4/15-4/17.  CSW spoke with [facility]: [facility] can receive pt tomorrow.  PA informed.     Expected Discharge Plan: Skilled Nursing Facility Barriers to Discharge: Continued Medical Work up, SNF Pending bed offer  Expected Discharge Plan and Services In-house Referral: Clinical Social Work   Post Acute Care Choice: Skilled Nursing Facility Living arrangements for the past 2 months: Single Family Home                                       Social Determinants of Health (SDOH) Interventions SDOH Screenings   Food Insecurity: No Food Insecurity (11/23/2023)  Housing: Low Risk  (11/23/2023)  Transportation Needs: No Transportation Needs (11/23/2023)  Utilities: Not At Risk (11/23/2023)  Alcohol Screen: Low Risk  (06/26/2023)  Depression (PHQ2-9): Low Risk  (06/26/2023)  Financial Resource Strain: Low Risk  (06/26/2023)  Physical Activity: Insufficiently Active (06/26/2023)  Social Connections: Socially Integrated (11/23/2023)  Stress: No Stress Concern Present (06/26/2023)  Recent Concern: Stress - Stress Concern Present (05/20/2023)  Tobacco Use: Medium Risk (11/23/2023)  Health Literacy: Adequate Health Literacy (06/26/2023)    Readmission Risk Interventions     No data to display

## 2023-11-27 NOTE — Progress Notes (Signed)
 Physical Therapy Treatment Patient Details Name: Cheryl [name] MRN: [id number] DOB: 1956/04/26 Today's Date: 11/27/2023   History of Present Illness Pt is a 68 y.o. female admitted 11/23/23 s/p fall with knee pain. Xray reveals left distal femur fx, underwent ORIF on 11/23/23.  PMH significant for L TKA w/ Dr. Abigail Abler in early January, HTN, obesity.    PT Comments  Continuing work on functional mobility and activity tolerance;  Session focused on continuing efforts at gait and functional transfers while safely keeping TWB LLE; Notable progress with bed mobility and transfers; Able to incr gait trial distances compared to yesterday's session; Overall progressing well; Anticipate continuing good progress at post-acute rehabilitation.    If plan is discharge home, recommend the following: A little help with bathing/dressing/bathroom;Assistance with cooking/housework;Assist for transportation;Help with stairs or ramp for entrance;A lot of help with walking and/or transfers   Can travel by private vehicle      (Potentially)  Equipment Recommendations  BSC/3in1;Wheelchair (measurements PT) (removable arm and leg rests, elevating leg rests, drop arm BSC)    Recommendations for Other Services       Precautions / Restrictions Precautions Precautions: Fall;Knee Precaution Booklet Issued: No Recall of Precautions/Restrictions: Intact Precaution/Restrictions Comments: L knee NO ROM, KI at all times Required Braces or Orthoses: Knee Immobilizer - Left Knee Immobilizer - Left: On at all times Restrictions LLE Weight Bearing Per Provider Order: Touchdown weight bearing     Mobility  Bed Mobility Overal bed mobility: Needs Assistance Bed Mobility: Supine to Sit     Supine to sit: Supervision     General bed mobility comments: smooth transition to sitting EOB    Transfers Overall transfer level: Needs assistance Equipment used: Rolling walker (2 wheels) Transfers: Sit to/from  Stand Sit to Stand: Min assist           General transfer comment: Cues for hand placement; imporving control of descent form stand to sit with "inthe moment" cues    Ambulation/Gait Ambulation/Gait assistance: Min assist, +2 safety/equipment Gait Distance (Feet): 10 Feet (x2) Assistive device: Rolling walker (2 wheels) Gait Pattern/deviations: Step-to pattern       General Gait Details: Step by step cues for sequence, safety, and TWB LE; Adjusted RW height (one notch lower) to make TWB easier   Stairs             Wheelchair Mobility     Tilt Bed    Modified Rankin (Stroke Patients Only)       Balance     Sitting balance-Leahy Scale: Good       Standing balance-Leahy Scale: Poor Standing balance comment: reliant on RW given her precs                            Communication Communication Communication: No apparent difficulties  Cognition Arousal: Alert Behavior During Therapy: WFL for tasks assessed/performed   PT - Cognitive impairments: No apparent impairments                         Following commands: Intact      Cueing Cueing Techniques: Verbal cues, Gestural cues, Tactile cues  Exercises      General Comments General comments (skin integrity, edema, etc.): Pt  was distressed upon PT entry, she was nervous about what she had read in my chart, and nervous about not being sure where she' sgoing to rehab      Pertinent  Vitals/Pain Pain Assessment Pain Assessment: Faces Faces Pain Scale: Hurts a little bit Pain Location: left LE, L shoulder Pain Descriptors / Indicators: Discomfort, Dull, Operative site guarding Pain Intervention(s): Limited activity within patient's tolerance    Home Living                          Prior Function            PT Goals (current goals can now be found in the care plan section) Acute Rehab PT Goals Patient Stated Goal: To get back home PT Goal Formulation: With  patient Time For Goal Achievement: 12/01/23 Potential to Achieve Goals: Good Progress towards PT goals: Progressing toward goals    Frequency    Min 4X/week      PT Plan      Co-evaluation              AM-PAC PT "6 Clicks" Mobility   Outcome Measure  Help needed turning from your back to your side while in a flat bed without using bedrails?: A Little Help needed moving from lying on your back to sitting on the side of a flat bed without using bedrails?: None Help needed moving to and from a bed to a chair (including a wheelchair)?: A Little Help needed standing up from a chair using your arms (e.g., wheelchair or bedside chair)?: A Little Help needed to walk in hospital room?: A Lot Help needed climbing 3-5 steps with a railing? : Total 6 Click Score: 16    End of Session Equipment Utilized During Treatment: Gait belt;Left knee immobilizer Activity Tolerance: Patient tolerated treatment well Patient left: in chair;with call bell/phone within reach;with chair alarm set Nurse Communication: Mobility status PT Visit Diagnosis: Unsteadiness on feet (R26.81);Other abnormalities of gait and mobility (R26.89);History of falling (Z91.81)     Time: 6387-5643 PT Time Calculation (min) (ACUTE ONLY): 28 min  Charges:    $Gait Training: 8-22 mins $Therapeutic Activity: 8-22 mins PT General Charges $$ ACUTE PT VISIT: 1 Visit                     Darcus Eastern, PT  Acute Rehabilitation Services Office [phone] Secure Chat welcomed    Marcial Setting 11/27/2023, 2:51 PM

## 2023-11-27 NOTE — Plan of Care (Signed)
  Problem: Activity: Goal: Risk for activity intolerance will decrease Outcome: Progressing   Problem: Pain Managment: Goal: General experience of comfort will improve and/or be controlled Outcome: Progressing

## 2023-11-28 DIAGNOSIS — E119 Type 2 diabetes mellitus without complications: Secondary | ICD-10-CM | POA: Diagnosis not present

## 2023-11-28 DIAGNOSIS — S7291XD Unspecified fracture of right femur, subsequent encounter for closed fracture with routine healing: Secondary | ICD-10-CM | POA: Diagnosis not present

## 2023-11-28 DIAGNOSIS — R278 Other lack of coordination: Secondary | ICD-10-CM | POA: Diagnosis not present

## 2023-11-28 DIAGNOSIS — M6281 Muscle weakness (generalized): Secondary | ICD-10-CM | POA: Diagnosis not present

## 2023-11-28 DIAGNOSIS — S72451D Displaced supracondylar fracture without intracondylar extension of lower end of right femur, subsequent encounter for closed fracture with routine healing: Secondary | ICD-10-CM | POA: Diagnosis not present

## 2023-11-28 DIAGNOSIS — K219 Gastro-esophageal reflux disease without esophagitis: Secondary | ICD-10-CM | POA: Diagnosis not present

## 2023-11-28 DIAGNOSIS — E785 Hyperlipidemia, unspecified: Secondary | ICD-10-CM | POA: Diagnosis not present

## 2023-11-28 DIAGNOSIS — F32A Depression, unspecified: Secondary | ICD-10-CM | POA: Diagnosis not present

## 2023-11-28 DIAGNOSIS — R2681 Unsteadiness on feet: Secondary | ICD-10-CM | POA: Diagnosis not present

## 2023-11-28 DIAGNOSIS — G4730 Sleep apnea, unspecified: Secondary | ICD-10-CM | POA: Diagnosis not present

## 2023-11-28 DIAGNOSIS — E669 Obesity, unspecified: Secondary | ICD-10-CM | POA: Diagnosis not present

## 2023-11-28 DIAGNOSIS — I10 Essential (primary) hypertension: Secondary | ICD-10-CM | POA: Diagnosis not present

## 2023-11-28 NOTE — Progress Notes (Signed)
 Discharge  packet (AVS + hardscripts) provided to pt with instructions,  Pt verbalized understanding of instructions. No complaints. Pt d/c to [facility] as ordered. Pt's sister to take her to the facility. Per pt she'll be here in the next few mins. Report given to staff nurse at [facility], all questions and concerns were fully answered.

## 2023-11-28 NOTE — TOC Transition Note (Signed)
 Transition of Care [facility]) - Discharge Note   Patient Details  Name: Cheryl [name] MRN: [id number] Date of Birth: Jul 17, 1956  [facility]) CM/SW Contact:  Elspeth Hals, LCSW Phone Number: 11/28/2023, 10:54 AM   Clinical Narrative:  Pt discharging to [facility], room 610B.  RN call report to [phone].  Pt sister will transport her to SNF.  Pt will need to be brought down to main north tower entrance with assistance getting into the vehicle.     1000: CSW confirmed with [facility] that [facility] can receive pt today.  CSW spoke with pt about transportation--pt made arrangements for sister to pick her up.     Final next level of care: Skilled Nursing Facility Barriers to Discharge: Barriers Resolved   Patient Goals and CMS Choice Patient states their goals for this hospitalization and ongoing recovery are:: walk again CMS Medicare.gov Compare Post Acute Care list provided to:: Patient Choice offered to / list presented to : Patient      Discharge Placement              Patient chooses bed at: [facility] Patient to be transferred to facility by: sister Name of family member notified: husband Debbora Fair Patient and family notified of of transfer: 11/28/23  Discharge Plan and Services Additional resources added to the After Visit Summary for   In-house Referral: Clinical Social Work   Post Acute Care Choice: Skilled Nursing Facility                               Social Drivers of Health (SDOH) Interventions SDOH Screenings   Food Insecurity: No Food Insecurity (11/23/2023)  Housing: Low Risk  (11/23/2023)  Transportation Needs: No Transportation Needs (11/23/2023)  Utilities: Not At Risk (11/23/2023)  Alcohol Screen: Low Risk  (06/26/2023)  Depression (PHQ2-9): Low Risk  (06/26/2023)  Financial Resource Strain: Low Risk  (06/26/2023)  Physical Activity: Insufficiently Active (06/26/2023)  Social Connections: Socially Integrated  (11/23/2023)  Stress: No Stress Concern Present (06/26/2023)  Recent Concern: Stress - Stress Concern Present (05/20/2023)  Tobacco Use: Medium Risk (11/23/2023)  Health Literacy: Adequate Health Literacy (06/26/2023)     Readmission Risk Interventions     No data to display

## 2023-11-28 NOTE — Plan of Care (Signed)
   Problem: Education: Goal: Knowledge of General Education information will improve Description: Including pain rating scale, medication(s)/side effects and non-pharmacologic comfort measures Outcome: Progressing   Problem: Activity: Goal: Risk for activity intolerance will decrease Outcome: Progressing   Problem: Pain Managment: Goal: General experience of comfort will improve and/or be controlled Outcome: Progressing

## 2023-11-28 NOTE — Addendum Note (Signed)
 Addendum  created 11/28/23 2140 by Arvie Latus, MD   Attestation recorded in Intraprocedure, Intraprocedure Attestations filed

## 2023-11-28 NOTE — Discharge Summary (Signed)
 Physician Discharge Summary  Patient ID: Cheryl [name] MRN: [id number] DOB/AGE: 1956/07/28 68 y.o.  Admit date: 11/22/2023 Discharge date: 11/28/2023  Admission Diagnoses:  Closed fracture of right femur, unspecified fracture morphology, unspecified portion of femur, sequela  Discharge Diagnoses:  Principal Problem:   Closed fracture of right femur, unspecified fracture morphology, unspecified portion of femur, sequela   Past Medical History:  Diagnosis Date   Allergy    Arthritis    Chest pain    Depression    Fatigue    GERD (gastroesophageal reflux disease)    Hyperlipidemia    Hypertension    Joint pain    Obesity    Osteopenia    Palpitation    Sleep apnea    uses CPAP    SOB (shortness of breath)    Urinary incontinence     Surgeries: Procedure(s): OPEN REDUCTION INTERNAL FIXATION (ORIF) DISTAL FEMUR FRACTURE on 11/23/2023   Consultants (if any): Treatment Team:  Saundra Curl, MD  Discharged Condition: Improved  Hospital Course: Cheryl [name] is an 68 y.o. female who was admitted 11/22/2023 with a diagnosis of Closed fracture of right femur, unspecified fracture morphology, unspecified portion of femur, sequela and went to the operating room on 11/23/2023 and underwent the above named procedures.    She was given perioperative antibiotics:  Anti-infectives (From admission, onward)    Start     Dose/Rate Route Frequency Ordered Stop   11/23/23 1830  ceFAZolin (ANCEF) IVPB 2g/100 mL premix        2 g 200 mL/hr over 30 Minutes Intravenous Every 6 hours 11/23/23 1737 11/24/23 0049   11/23/23 0600  ceFAZolin (ANCEF) IVPB 2g/100 mL premix        2 g 200 mL/hr over 30 Minutes Intravenous On call to O.R. 11/22/23 2008 11/23/23 1328     .  She was given sequential compression devices, early ambulation, and Lovenox for DVT prophylaxis.  She benefited maximally from the hospital stay and there were no complications.    Recent vital signs:   Vitals:   11/28/23 0523 11/28/23 0727  BP: 134/64 131/74  Pulse: 69 74  Resp: 17 18  Temp: 98.8 F (37.1 C) 98.2 F (36.8 C)  SpO2: 96% 95%    Recent laboratory studies:  Lab Results  Component Value Date   HGB 8.7 (L) 11/27/2023   HGB 7.6 (L) 11/26/2023   HGB 9.8 (L) 11/23/2023   Lab Results  Component Value Date   WBC 6.7 11/26/2023   PLT 281 11/26/2023   Lab Results  Component Value Date   INR 1.1 11/22/2023   Lab Results  Component Value Date   NA 135 11/26/2023   K 3.8 11/26/2023   CL 96 (L) 11/26/2023   CO2 27 11/26/2023   BUN 18 11/26/2023   CREATININE 1.00 11/26/2023   GLUCOSE 89 11/26/2023    Discharge Medications:   Allergies as of 11/28/2023       Reactions   Ace Inhibitors Cough   Lisinopril Cough        Medication List     STOP taking these medications    acetaminophen 650 MG CR tablet Commonly known as: TYLENOL Replaced by: acetaminophen 500 MG tablet   aspirin 81 MG chewable tablet Replaced by: aspirin EC 81 MG tablet       TAKE these medications    acetaminophen 500 MG tablet Commonly known as: TYLENOL Take 2 tablets (1,000 mg total) by mouth every 6 (six) hours  as needed for mild pain (pain score 1-3) or moderate pain (pain score 4-6). Replaces: acetaminophen 650 MG CR tablet   aspirin EC 81 MG tablet Take 1 tablet (81 mg total) by mouth 2 (two) times daily. To prevent blood clots for 30 days after surgery. Replaces: aspirin 81 MG chewable tablet   buPROPion 200 MG 12 hr tablet Commonly known as: Wellbutrin SR Take 1 tablet (200 mg total) by mouth daily. What changed: when to take this   CLARITIN PO Take 1 tablet by mouth in the morning.   docusate sodium 100 MG capsule Commonly known as: COLACE Take 100 mg by mouth in the morning.   losartan-hydrochlorothiazide 100-12.5 MG tablet Commonly known as: HYZAAR Take 1 tablet by mouth daily. What changed: when to take this   meloxicam 15 MG tablet Commonly known  as: MOBIC Take 1 tablet (15 mg total) by mouth daily as needed for pain (and inflammation).   metFORMIN 500 MG tablet Commonly known as: GLUCOPHAGE Take 1 tablet (500 mg total) by mouth 2 (two) times daily with a meal.   methocarbamol 750 MG tablet Commonly known as: Robaxin-750 Take 1 tablet (750 mg total) by mouth every 8 (eight) hours as needed for muscle spasms.   ondansetron 4 MG disintegrating tablet Commonly known as: ZOFRAN-ODT Take 1 tablet (4 mg total) by mouth every 8 (eight) hours as needed for nausea or vomiting.   oxyCODONE 5 MG immediate release tablet Commonly known as: Roxicodone Take 1 tablet (5 mg total) by mouth every 4 (four) hours as needed for severe pain (pain score 7-10).   UNABLE TO FIND C PAP for sleep apnea   venlafaxine 75 MG tablet Commonly known as: EFFEXOR Take 1 tablet (75 mg total) by mouth daily. What changed: when to take this               Discharge Care Instructions  (From admission, onward)           Start     Ordered   11/28/23 0000  Touch down weight bearing       Question Answer Comment  Laterality left   Extremity Lower      11/28/23 1011            Diagnostic Studies: US RENAL Result Date: 11/26/2023 CLINICAL DATA:  Acute kidney injury EXAM: RENAL / URINARY TRACT ULTRASOUND COMPLETE COMPARISON:  None Available. FINDINGS: Right Kidney: Renal measurements: 10.7 x 4.7 x 6.2 cm = volume: 160 mL. 1.6 cm cyst off the lower pole of the right kidney. No hydronephrosis. Normal echotexture. Left Kidney: Renal measurements: 10.1 x 5.2 x 4.6 cm = volume: 127 mL. Echogenicity within normal limits. No mass or hydronephrosis visualized. Bladder: Appears normal for degree of bladder distention. Other: None. IMPRESSION: No acute findings.  No hydronephrosis. Electronically Signed   By: Charlett Nose M.D.   On: 11/26/2023 20:24   DG Knee Left Port Result Date: 11/23/2023 CLINICAL DATA:  Closed fracture, left distal femur EXAM:  PORTABLE LEFT KNEE - 1-2 VIEW COMPARISON:  11/22/2023 FINDINGS: Two view radiograph left knee demonstrates a comminuted distal left femoral periprosthetic fracture status post ORIF with a lateral cortical plate. Fracture fragments are in near anatomic alignment. No interval fracture. Left total knee arthroplasty components in expected alignment. Moderate left knee effusion. Moderate prepatellar soft tissue swelling IMPRESSION: 1. Comminuted distal left femoral periprosthetic fracture status post ORIF. Fracture fragments are in near anatomic alignment. Electronically Signed   By: Helyn Numbers  M.D.   On: 11/23/2023 20:23   DG FEMUR PORT MIN 2 VIEWS LEFT Result Date: 11/23/2023 CLINICAL DATA:  Left leg periprosthetic fracture, fall EXAM: LEFT FEMUR PORTABLE 2 VIEWS COMPARISON:  11/22/2023 FINDINGS: Comminuted periprosthetic distal left femoral fracture is again identified status post ORIF with a lateral cortical plate with fracture fragments in near anatomic alignment. No interval fracture or dislocation. Moderate left knee effusion is present. Subcutaneous gas is seen within the lateral soft tissues related to recent surgery. Mild prepatellar soft tissue swelling. IMPRESSION: 1. Comminuted periprosthetic distal left femoral fracture in near anatomic alignment status post ORIF. Electronically Signed   By: Worthy Heads M.D.   On: 11/23/2023 20:00   DG FEMUR MIN 2 VIEWS LEFT Result Date: 11/23/2023 CLINICAL DATA:  Open reduction internal fixation of distal left femoral fracture. EXAM: LEFT FEMUR 2 VIEWS COMPARISON:  Left femur radiographs 11/22/2023 FINDINGS: Images were performed intraoperatively without the presence of a radiologist. Redemonstration of comminuted distal femoral periprosthetic fracture just proximal to femoral component of total left knee arthroplasty. New lateral plate and screw fixation of this fracture with improved alignment. Total fluoroscopy images: 6 Total fluoroscopy time: 67 seconds  Total dose: Radiation Exposure Index (as provided by the fluoroscopic device): 11.33 mGy air Kerma Please see intraoperative findings for further detail. IMPRESSION: Intraoperative fluoroscopic guidance for open reduction internal fixation of distal left femoral periprosthetic fracture. Electronically Signed   By: Bertina Broccoli M.D.   On: 11/23/2023 15:49   DG C-Arm 1-60 Min-No Report Result Date: 11/23/2023 Fluoroscopy was utilized by the requesting physician.  No radiographic interpretation.   DG Knee Left Port Result Date: 11/22/2023 CLINICAL DATA:  Patient in traction EXAM: PORTABLE LEFT KNEE - 1-2 VIEW COMPARISON:  11/22/2023 FINDINGS: Knee replacement. Acute comminuted periprosthetic distal femoral fracture with decreased lateral displacement, about 1/2 shaft diameter residual lateral displacement. Residual 1 shaft diameter posterior displacement of distal fracture fragment, decreased overriding, now about 3.2 cm. IMPRESSION: Knee replacement with comminuted and displaced periprosthetic distal femoral fracture with decreased displacement and overriding compared to prior Electronically Signed   By: Esmeralda Hedge M.D.   On: 11/22/2023 20:40   DG Knee 1-2 Views Left Result Date: 11/22/2023 CLINICAL DATA:  Knee pain EXAM: LEFT KNEE - 1-2 VIEW COMPARISON:  11/22/2023 FINDINGS: Left knee replacement. Acute comminuted periprosthetic distal femoral fracture with about 1 shaft diameter lateral and posterior displacement of the distal femoral fracture fragment with respect to the shaft. Overriding of fracture fragments by about 4 cm. IMPRESSION: Left knee replacement with acute comminuted displaced and overriding periprosthetic distal femoral fracture. Electronically Signed   By: Esmeralda Hedge M.D.   On: 11/22/2023 20:39   DG Chest 1 View Result Date: 11/22/2023 CLINICAL DATA:  Fall, left femur fracture. EXAM: CHEST  1 VIEW COMPARISON:  Radiograph 09/17/2011 FINDINGS: Low lung volumes.The cardiomediastinal  contours are normal for technique. Question of right apical opacity, not well assessed. Pulmonary vasculature is normal. No pleural effusion or pneumothorax. No acute osseous abnormalities are seen. IMPRESSION: Low lung volumes. Question of right apical opacity versus artifact. Recommend PA and lateral views when patient is able. Electronically Signed   By: Chadwick Colonel M.D.   On: 11/22/2023 17:30   DG FEMUR MIN 2 VIEWS LEFT Result Date: 11/22/2023 CLINICAL DATA:  Pain after fall. EXAM: PELVIS - 1-2 VIEW; LEFT FEMUR 2 VIEWS COMPARISON:  None Available. FINDINGS: Pelvis: The cortical margins of the bony pelvis are intact. No fracture. Pubic symphysis and sacroiliac joints are  congruent. Both femoral heads are well-seated in the respective acetabula. Femur: Comminuted and moderately displaced periprosthetic distal femur fracture adjacent to the femoral component of knee arthroplasty. Distal fragment is displaced 1 shaft width laterally with osseous overriding of greater than 4 cm. The tibiofemoral components remain congruent. Generalized soft tissue IMPRESSION: 1. Comminuted and moderately displaced periprosthetic distal femur fracture adjacent to the femoral component of knee arthroplasty. 2. No proximal femur or pelvic fracture. Electronically Signed   By: Chadwick Colonel M.D.   On: 11/22/2023 17:29   DG Pelvis 1-2 Views Result Date: 11/22/2023 CLINICAL DATA:  Pain after fall. EXAM: PELVIS - 1-2 VIEW; LEFT FEMUR 2 VIEWS COMPARISON:  None Available. FINDINGS: Pelvis: The cortical margins of the bony pelvis are intact. No fracture. Pubic symphysis and sacroiliac joints are congruent. Both femoral heads are well-seated in the respective acetabula. Femur: Comminuted and moderately displaced periprosthetic distal femur fracture adjacent to the femoral component of knee arthroplasty. Distal fragment is displaced 1 shaft width laterally with osseous overriding of greater than 4 cm. The tibiofemoral components  remain congruent. Generalized soft tissue IMPRESSION: 1. Comminuted and moderately displaced periprosthetic distal femur fracture adjacent to the femoral component of knee arthroplasty. 2. No proximal femur or pelvic fracture. Electronically Signed   By: Chadwick Colonel M.D.   On: 11/22/2023 17:29   DG Shoulder Left Result Date: 11/22/2023 CLINICAL DATA:  Pain after fall. EXAM: LEFT SHOULDER - 2+ VIEW COMPARISON:  None Available. FINDINGS: There is no evidence of fracture or dislocation. Mild acromioclavicular and glenohumeral degenerative change. Soft tissues are unremarkable. IMPRESSION: 1. No fracture or dislocation of the left shoulder. 2. Mild degenerative change. Electronically Signed   By: Chadwick Colonel M.D.   On: 11/22/2023 17:28    Disposition: Discharge disposition: 03-Skilled Nursing Facility       Discharge Instructions     Call MD / Call 911   Complete by: As directed    If you experience chest pain or shortness of breath, CALL 911 and be transported to the hospital emergency room.  If you develope a fever above 101 F, pus (white drainage) or increased drainage or redness at the wound, or calf pain, call your surgeon's office.   Diet - low sodium heart healthy   Complete by: As directed    Discharge instructions   Complete by: As directed    You may toe touch weight bear on the left leg. Must be in knee brace if up and mobilizing.  Keep your dressing on and dry until follow up. Take medicine to prevent blood clots as directed. Take pain medicine as needed with the goal of transitioning to over the counter medicines.    INSTRUCTIONS AFTER SURGERY   Remove items at home which could result in a fall. This includes throw rugs or furniture in walking pathways ICE to the affected area every three hours while awake for 30 minutes at a time, for at least the first 3-5 days, and then as needed for pain and swelling.  Continue to use ice for pain and swelling. You may notice  swelling that will progress down to the foot and ankle.  This is normal after surgery.  Elevate your leg when you are not up walking on it.   Continue to use the breathing machine you got in the hospital (incentive spirometer) which will help keep your temperature down.  It is common for your temperature to cycle up and down following surgery, especially at night when you  are not up moving around and exerting yourself.  The breathing machine keeps your lungs expanded and your temperature down.   DIET:  As you were doing prior to hospitalization, we recommend a well-balanced diet and plenty of fluids to avoid dehydration.  DRESSING / WOUND CARE / SHOWERING  You may shower 3 days after surgery, but keep the wounds dry during showering.  You may use an occlusive plastic wrap (Press'n Seal for example) with blue painter's tape at edges, NO SOAKING/SUBMERGING IN THE BATHTUB.  If the bandage gets wet, call the office.   ACTIVITY  Increase activity slowly as tolerated, but follow the weight bearing instructions below.   No driving for 6 weeks or until further direction given by your physician.  You cannot drive while taking narcotics.  No lifting or carrying greater than 10 lbs. until further directed by your surgeon. Avoid periods of inactivity such as sitting longer than an hour when not asleep. This helps prevent blood clots.  You may return to work once you are authorized by your doctor.    WEIGHT BEARING   Toe touch weight bearing with assist device (walker, cane, etc) and knee immobilizer as directed, use it as long as suggested by your surgeon or therapist.   EXERCISES  Results after orthopedic surgery are often greatly improved when you follow the exercise, range of motion and muscle strengthening exercises prescribed by your doctor. Safety measures are also important to protect the bone from further injury. Any time any of these exercises cause you to have increased pain or swelling,  decrease what you are doing until you are comfortable again and then slowly increase them. If you have problems or questions, call your caregiver or physical therapist for advice.   Rehabilitation is important following a surgery. After just a few days of immobilization, the muscles of the leg can become weakened and shrink (atrophy).  These exercises are designed to build up the tone and strength of the thigh and leg muscles and to improve motion. Often times heat used for twenty to thirty minutes before working out will loosen up your tissues and help with improving the range of motion but do not use heat for the first two weeks following surgery (sometimes heat can increase post-operative swelling).      CONSTIPATION  Constipation is defined medically as fewer than three stools per week and severe constipation as less than one stool per week.  Even if you have a regular bowel pattern at home, your normal regimen is likely to be disrupted due to multiple reasons following surgery.  Combination of anesthesia, postoperative narcotics, change in appetite and fluid intake all can affect your bowels.   YOU MUST use at least one of the following options; they are listed in order of increasing strength to get the job done.  They are all available over the counter, and you may need to use some, POSSIBLY even all of these options:    Drink plenty of fluids (prune juice may be helpful) and high fiber foods Colace 100 mg by mouth twice a day  Senokot for constipation as directed and as needed Dulcolax (bisacodyl), take with full glass of water  Miralax (polyethylene glycol) once or twice a day as needed.  If you have tried all these things and are unable to have a bowel movement in the first 3-4 days after surgery call either your surgeon or your primary doctor.    If you experience loose stools or diarrhea, hold  the medications until you stool forms back up.  If your symptoms do not get better within 1  week or if they get worse, check with your doctor.  If you experience "the worst abdominal pain ever" or develop nausea or vomiting, please contact the office immediately for further recommendations for treatment.   ITCHING:  If you experience itching with your medications, try taking only a single pain pill, or even half a pain pill at a time.  You can also use Benadryl over the counter for itching or also to help with sleep.   TED HOSE STOCKINGS:  Use stockings on both legs until for at least 2 weeks or as directed by physician office. They may be removed at night for sleeping.  MEDICATIONS:  See your medication summary on the "After Visit Summary" that nursing will review with you.  You may have some home medications which will be placed on hold until you complete the course of blood thinner medication.  It is important for you to complete the blood thinner medication as prescribed.  Take medicines as prescribed.   You have several different medicines that work in different ways. - Tylenol is for mild to moderate pain. Try to take this medicine before turning to your narcotic medicines.  - Meloxicam is to reduce pain / inflammation - Robaxin is for muscle spasms. This medicine can make you drowsy. - Oxycodone is a narcotic pain medicine.  Take this for severe pain. This medicine can be dehydrating / constipating. - Zofran is for nausea and vomiting. - Colace is for constipation prevention while taking pain medicine.  - Aspirin is to prevent blood clots after surgery. YOU MUST TAKE THIS MEDICINE!!  PRECAUTIONS:  If you experience chest pain or shortness of breath - call 911 immediately for transfer to the hospital emergency department.   If you develop a fever greater that 101 F, purulent drainage from wound, increased redness or drainage from wound, foul odor from the wound/dressing, or calf pain - CONTACT YOUR SURGEON.                                                   FOLLOW-UP  APPOINTMENTS:  If you do not already have a post-op appointment, please call the office [phone] for an appointment to be seen by Dr. Eulah Pont in 2 weeks.   OTHER INSTRUCTIONS:   MAKE SURE YOU:  Understand these instructions.  Get help right away if you are not doing well or get worse.    Thank you for letting us be a part of your medical care team.  It is a privilege we respect greatly.  We hope these instructions will help you stay on track for a fast and full recovery!   Do not put a pillow under the knee. Place it under the heel.   Complete by: As directed    Post-operative opioid taper instructions:   Complete by: As directed    POST-OPERATIVE OPIOID TAPER INSTRUCTIONS: It is important to wean off of your opioid medication as soon as possible. If you do not need pain medication after your surgery it is ok to stop day one. Opioids include: Codeine, Hydrocodone(Norco, Vicodin), Oxycodone(Percocet, oxycontin) and hydromorphone amongst others.  Long term and even short term use of opiods can cause: Increased pain response Dependence Constipation Depression Respiratory depression And more.  Withdrawal symptoms can include Flu like symptoms Nausea, vomiting And more Techniques to manage these symptoms Hydrate well Eat regular healthy meals Stay active Use relaxation techniques(deep breathing, meditating, yoga) Do Not substitute Alcohol to help with tapering If you have been on opioids for less than two weeks and do not have pain than it is ok to stop all together.  Plan to wean off of opioids This plan should start within one week post op of your joint replacement. Maintain the same interval or time between taking each dose and first decrease the dose.  Cut the total daily intake of opioids by one tablet each day Next start to increase the time between doses. The last dose that should be eliminated is the evening dose.      Touch down weight bearing   Complete by: As  directed    Laterality: left   Extremity: Lower        Follow-up Information     Saundra Curl, MD. Schedule an appointment as soon as possible for a visit in 2 week(s).   Specialty: Orthopedic Surgery Contact information: [address] [place] [address] [phone]                  Signed: Ander Bame Office [phone] 11/28/2023, 10:11 AM

## 2023-11-29 DIAGNOSIS — G4730 Sleep apnea, unspecified: Secondary | ICD-10-CM | POA: Diagnosis not present

## 2023-11-29 DIAGNOSIS — E669 Obesity, unspecified: Secondary | ICD-10-CM | POA: Diagnosis not present

## 2023-11-29 DIAGNOSIS — E119 Type 2 diabetes mellitus without complications: Secondary | ICD-10-CM | POA: Diagnosis not present

## 2023-11-29 DIAGNOSIS — I10 Essential (primary) hypertension: Secondary | ICD-10-CM | POA: Diagnosis not present

## 2023-11-29 DIAGNOSIS — F32A Depression, unspecified: Secondary | ICD-10-CM | POA: Diagnosis not present

## 2023-11-29 DIAGNOSIS — E785 Hyperlipidemia, unspecified: Secondary | ICD-10-CM | POA: Diagnosis not present

## 2023-12-05 ENCOUNTER — Ambulatory Visit (INDEPENDENT_AMBULATORY_CARE_PROVIDER_SITE_OTHER): Payer: Medicare HMO | Admitting: Family Medicine

## 2023-12-06 DIAGNOSIS — I10 Essential (primary) hypertension: Secondary | ICD-10-CM | POA: Diagnosis not present

## 2023-12-06 DIAGNOSIS — K219 Gastro-esophageal reflux disease without esophagitis: Secondary | ICD-10-CM | POA: Diagnosis not present

## 2023-12-06 DIAGNOSIS — G4730 Sleep apnea, unspecified: Secondary | ICD-10-CM | POA: Diagnosis not present

## 2023-12-06 DIAGNOSIS — E119 Type 2 diabetes mellitus without complications: Secondary | ICD-10-CM | POA: Diagnosis not present

## 2023-12-12 DIAGNOSIS — S72451D Displaced supracondylar fracture without intracondylar extension of lower end of right femur, subsequent encounter for closed fracture with routine healing: Secondary | ICD-10-CM | POA: Diagnosis not present

## 2023-12-18 ENCOUNTER — Telehealth: Payer: Self-pay

## 2023-12-18 DIAGNOSIS — F32A Depression, unspecified: Secondary | ICD-10-CM | POA: Diagnosis not present

## 2023-12-18 DIAGNOSIS — K219 Gastro-esophageal reflux disease without esophagitis: Secondary | ICD-10-CM | POA: Diagnosis not present

## 2023-12-18 DIAGNOSIS — M8580 Other specified disorders of bone density and structure, unspecified site: Secondary | ICD-10-CM | POA: Diagnosis not present

## 2023-12-18 DIAGNOSIS — E785 Hyperlipidemia, unspecified: Secondary | ICD-10-CM | POA: Diagnosis not present

## 2023-12-18 DIAGNOSIS — G4733 Obstructive sleep apnea (adult) (pediatric): Secondary | ICD-10-CM | POA: Diagnosis not present

## 2023-12-18 DIAGNOSIS — M1990 Unspecified osteoarthritis, unspecified site: Secondary | ICD-10-CM | POA: Diagnosis not present

## 2023-12-18 DIAGNOSIS — S72002D Fracture of unspecified part of neck of left femur, subsequent encounter for closed fracture with routine healing: Secondary | ICD-10-CM | POA: Diagnosis not present

## 2023-12-18 DIAGNOSIS — I10 Essential (primary) hypertension: Secondary | ICD-10-CM | POA: Diagnosis not present

## 2023-12-18 DIAGNOSIS — E669 Obesity, unspecified: Secondary | ICD-10-CM | POA: Diagnosis not present

## 2023-12-18 NOTE — Telephone Encounter (Signed)
 Copied from CRM [id number]. Topic: General - Other >> Dec 18, 2023 12:32 PM Alysia Jumbo S wrote: Reason for CRM: Jenna with [facility] calling to inform provider of start of care date of 12/18/2023 for nursing and therapy per the patient's request.  Callback # [id number]

## 2023-12-18 NOTE — Telephone Encounter (Signed)
 Copied from CRM [id number]. Topic: Clinical - Home Health Verbal Orders >> Dec 18, 2023  2:38 PM Alyse July wrote: Caller/Agency: [facility] Callback Number: [phone] Service Requested: Occupational Therapy and Physical Therapy Frequency: once a week for 4 week.. evaluation for additional therapy  Any new concerns about the patient? No

## 2023-12-18 NOTE — Telephone Encounter (Signed)
 Noted talked to heather from [facility].

## 2023-12-19 ENCOUNTER — Telehealth: Payer: Self-pay

## 2023-12-19 DIAGNOSIS — N179 Acute kidney failure, unspecified: Secondary | ICD-10-CM

## 2023-12-20 ENCOUNTER — Telehealth: Payer: Self-pay | Admitting: *Deleted

## 2023-12-20 DIAGNOSIS — I10 Essential (primary) hypertension: Secondary | ICD-10-CM | POA: Diagnosis not present

## 2023-12-20 DIAGNOSIS — S72002D Fracture of unspecified part of neck of left femur, subsequent encounter for closed fracture with routine healing: Secondary | ICD-10-CM | POA: Diagnosis not present

## 2023-12-20 DIAGNOSIS — E669 Obesity, unspecified: Secondary | ICD-10-CM | POA: Diagnosis not present

## 2023-12-20 DIAGNOSIS — M1990 Unspecified osteoarthritis, unspecified site: Secondary | ICD-10-CM | POA: Diagnosis not present

## 2023-12-20 DIAGNOSIS — G4733 Obstructive sleep apnea (adult) (pediatric): Secondary | ICD-10-CM | POA: Diagnosis not present

## 2023-12-20 DIAGNOSIS — K219 Gastro-esophageal reflux disease without esophagitis: Secondary | ICD-10-CM | POA: Diagnosis not present

## 2023-12-20 DIAGNOSIS — M8580 Other specified disorders of bone density and structure, unspecified site: Secondary | ICD-10-CM | POA: Diagnosis not present

## 2023-12-20 DIAGNOSIS — E785 Hyperlipidemia, unspecified: Secondary | ICD-10-CM | POA: Diagnosis not present

## 2023-12-20 DIAGNOSIS — F32A Depression, unspecified: Secondary | ICD-10-CM | POA: Diagnosis not present

## 2023-12-20 NOTE — Progress Notes (Signed)
 Complex Care Management Note Care Guide Note  12/20/2023 Name: Cheryl [name] MRN: [id number] DOB: 10-28-1955   Complex Care Management Outreach Attempts: An unsuccessful telephone outreach was attempted today to offer the patient information about available complex care management services.  Follow Up Plan:  Additional outreach attempts will be made to offer the patient complex care management information and services.   Encounter Outcome:  Patient Request to Call Back  Kandis Ormond, CMA [place]  [facility], [facility] Guide Direct Dial: [phone]  Fax: [phone] Website: [email or web address]

## 2023-12-21 DIAGNOSIS — I10 Essential (primary) hypertension: Secondary | ICD-10-CM | POA: Diagnosis not present

## 2023-12-21 DIAGNOSIS — F32A Depression, unspecified: Secondary | ICD-10-CM | POA: Diagnosis not present

## 2023-12-21 DIAGNOSIS — M1990 Unspecified osteoarthritis, unspecified site: Secondary | ICD-10-CM | POA: Diagnosis not present

## 2023-12-21 DIAGNOSIS — S72002D Fracture of unspecified part of neck of left femur, subsequent encounter for closed fracture with routine healing: Secondary | ICD-10-CM | POA: Diagnosis not present

## 2023-12-21 DIAGNOSIS — G4733 Obstructive sleep apnea (adult) (pediatric): Secondary | ICD-10-CM | POA: Diagnosis not present

## 2023-12-21 DIAGNOSIS — E785 Hyperlipidemia, unspecified: Secondary | ICD-10-CM | POA: Diagnosis not present

## 2023-12-21 DIAGNOSIS — E669 Obesity, unspecified: Secondary | ICD-10-CM | POA: Diagnosis not present

## 2023-12-21 DIAGNOSIS — M8580 Other specified disorders of bone density and structure, unspecified site: Secondary | ICD-10-CM | POA: Diagnosis not present

## 2023-12-21 DIAGNOSIS — K219 Gastro-esophageal reflux disease without esophagitis: Secondary | ICD-10-CM | POA: Diagnosis not present

## 2023-12-21 NOTE — Progress Notes (Signed)
 Complex Care Management Note  Care Guide Note 12/21/2023 Name: Cheryl [name] MRN: [id number] DOB: June 04, 1956  Cheryl [name] is a 68 y.o. year old female who sees Francenia Ingle, NP for primary care. I reached out to Artelia Bijou by phone today to offer complex care management services.  [name] was given information about Complex Care Management services today including:   The Complex Care Management services include support from the care team which includes your Nurse Care Manager, Clinical Social Worker, or Pharmacist.  The Complex Care Management team is here to help remove barriers to the health concerns and goals most important to you. Complex Care Management services are voluntary, and the patient may decline or stop services at any time by request to their care team member.   Complex Care Management Consent Status: Patient agreed to services and verbal consent obtained.   Follow up plan:  Telephone appointment with complex care management team member scheduled for:  12/27/2023  Encounter Outcome:  Patient Scheduled  Kandis Ormond, CMA [place]  [facility], [facility] Guide Direct Dial: [phone]  Fax: [phone] Website: [email or web address]

## 2023-12-24 ENCOUNTER — Encounter: Payer: Medicare HMO | Admitting: Family Medicine

## 2023-12-24 ENCOUNTER — Other Ambulatory Visit (INDEPENDENT_AMBULATORY_CARE_PROVIDER_SITE_OTHER): Payer: Self-pay | Admitting: Family Medicine

## 2023-12-24 DIAGNOSIS — E785 Hyperlipidemia, unspecified: Secondary | ICD-10-CM | POA: Diagnosis not present

## 2023-12-24 DIAGNOSIS — F3289 Other specified depressive episodes: Secondary | ICD-10-CM

## 2023-12-24 DIAGNOSIS — F32A Depression, unspecified: Secondary | ICD-10-CM | POA: Diagnosis not present

## 2023-12-24 DIAGNOSIS — S72002D Fracture of unspecified part of neck of left femur, subsequent encounter for closed fracture with routine healing: Secondary | ICD-10-CM | POA: Diagnosis not present

## 2023-12-24 DIAGNOSIS — E669 Obesity, unspecified: Secondary | ICD-10-CM | POA: Diagnosis not present

## 2023-12-24 DIAGNOSIS — K219 Gastro-esophageal reflux disease without esophagitis: Secondary | ICD-10-CM | POA: Diagnosis not present

## 2023-12-24 DIAGNOSIS — I10 Essential (primary) hypertension: Secondary | ICD-10-CM | POA: Diagnosis not present

## 2023-12-24 DIAGNOSIS — M8580 Other specified disorders of bone density and structure, unspecified site: Secondary | ICD-10-CM | POA: Diagnosis not present

## 2023-12-24 DIAGNOSIS — G4733 Obstructive sleep apnea (adult) (pediatric): Secondary | ICD-10-CM | POA: Diagnosis not present

## 2023-12-24 DIAGNOSIS — M1990 Unspecified osteoarthritis, unspecified site: Secondary | ICD-10-CM | POA: Diagnosis not present

## 2023-12-25 DIAGNOSIS — M8580 Other specified disorders of bone density and structure, unspecified site: Secondary | ICD-10-CM | POA: Diagnosis not present

## 2023-12-25 DIAGNOSIS — G4733 Obstructive sleep apnea (adult) (pediatric): Secondary | ICD-10-CM | POA: Diagnosis not present

## 2023-12-25 DIAGNOSIS — I10 Essential (primary) hypertension: Secondary | ICD-10-CM | POA: Diagnosis not present

## 2023-12-25 DIAGNOSIS — K219 Gastro-esophageal reflux disease without esophagitis: Secondary | ICD-10-CM | POA: Diagnosis not present

## 2023-12-25 DIAGNOSIS — M1990 Unspecified osteoarthritis, unspecified site: Secondary | ICD-10-CM | POA: Diagnosis not present

## 2023-12-25 DIAGNOSIS — E669 Obesity, unspecified: Secondary | ICD-10-CM | POA: Diagnosis not present

## 2023-12-25 DIAGNOSIS — E785 Hyperlipidemia, unspecified: Secondary | ICD-10-CM | POA: Diagnosis not present

## 2023-12-25 DIAGNOSIS — S72002D Fracture of unspecified part of neck of left femur, subsequent encounter for closed fracture with routine healing: Secondary | ICD-10-CM | POA: Diagnosis not present

## 2023-12-25 DIAGNOSIS — F32A Depression, unspecified: Secondary | ICD-10-CM | POA: Diagnosis not present

## 2023-12-26 DIAGNOSIS — E669 Obesity, unspecified: Secondary | ICD-10-CM | POA: Diagnosis not present

## 2023-12-26 DIAGNOSIS — M1990 Unspecified osteoarthritis, unspecified site: Secondary | ICD-10-CM | POA: Diagnosis not present

## 2023-12-26 DIAGNOSIS — K219 Gastro-esophageal reflux disease without esophagitis: Secondary | ICD-10-CM | POA: Diagnosis not present

## 2023-12-26 DIAGNOSIS — S72002D Fracture of unspecified part of neck of left femur, subsequent encounter for closed fracture with routine healing: Secondary | ICD-10-CM | POA: Diagnosis not present

## 2023-12-26 DIAGNOSIS — E785 Hyperlipidemia, unspecified: Secondary | ICD-10-CM | POA: Diagnosis not present

## 2023-12-26 DIAGNOSIS — M8580 Other specified disorders of bone density and structure, unspecified site: Secondary | ICD-10-CM | POA: Diagnosis not present

## 2023-12-26 DIAGNOSIS — F32A Depression, unspecified: Secondary | ICD-10-CM | POA: Diagnosis not present

## 2023-12-26 DIAGNOSIS — G4733 Obstructive sleep apnea (adult) (pediatric): Secondary | ICD-10-CM | POA: Diagnosis not present

## 2023-12-26 DIAGNOSIS — I10 Essential (primary) hypertension: Secondary | ICD-10-CM | POA: Diagnosis not present

## 2023-12-27 ENCOUNTER — Other Ambulatory Visit: Payer: Self-pay | Admitting: Licensed Clinical Social Worker

## 2023-12-27 DIAGNOSIS — E669 Obesity, unspecified: Secondary | ICD-10-CM | POA: Diagnosis not present

## 2023-12-27 DIAGNOSIS — E785 Hyperlipidemia, unspecified: Secondary | ICD-10-CM | POA: Diagnosis not present

## 2023-12-27 DIAGNOSIS — M1990 Unspecified osteoarthritis, unspecified site: Secondary | ICD-10-CM | POA: Diagnosis not present

## 2023-12-27 DIAGNOSIS — I10 Essential (primary) hypertension: Secondary | ICD-10-CM | POA: Diagnosis not present

## 2023-12-27 DIAGNOSIS — K219 Gastro-esophageal reflux disease without esophagitis: Secondary | ICD-10-CM | POA: Diagnosis not present

## 2023-12-27 DIAGNOSIS — F32A Depression, unspecified: Secondary | ICD-10-CM | POA: Diagnosis not present

## 2023-12-27 DIAGNOSIS — S72002D Fracture of unspecified part of neck of left femur, subsequent encounter for closed fracture with routine healing: Secondary | ICD-10-CM | POA: Diagnosis not present

## 2023-12-27 DIAGNOSIS — G4733 Obstructive sleep apnea (adult) (pediatric): Secondary | ICD-10-CM | POA: Diagnosis not present

## 2023-12-27 DIAGNOSIS — M8580 Other specified disorders of bone density and structure, unspecified site: Secondary | ICD-10-CM | POA: Diagnosis not present

## 2023-12-27 NOTE — Patient Outreach (Signed)
 Complex Care Management   Visit Note  12/27/2023  Name:  Cheryl [name] MRN: [id number] DOB: 09-21-1955  Situation: Referral received for Complex Care Management related to [facility]/Behavioral Health diagnosis Depression I obtained verbal consent from Patient.  Visit completed with patient  on the phone  Background:   Past Medical History:  Diagnosis Date   Allergy    Arthritis    Chest pain    Depression    Fatigue    GERD (gastroesophageal reflux disease)    Hyperlipidemia    Hypertension    Joint pain    Obesity    Osteopenia    Palpitation    Sleep apnea    uses CPAP    SOB (shortness of breath)    Urinary incontinence     Assessment: Patient Reported Symptoms:  Cognitive Cognitive Status: Alert and oriented to person, place, and time, Insightful and able to interpret abstract concepts, Normal speech and language skills Cognitive/Intellectual Conditions Management [RPT]: None reported or documented in medical history or problem list   Health Maintenance Behaviors: None  Neurological Neurological Review of Symptoms: No symptoms reported Neurological Management Strategies: Activity  HEENT HEENT Symptoms Reported: No symptoms reported      Cardiovascular Cardiovascular Symptoms Reported: No symptoms reported Does patient have uncontrolled Hypertension?: No Cardiovascular Conditions: Hypertension Cardiovascular Management Strategies: Medication therapy, Activity Cardiovascular Self-Management Outcome: 3 (uncertain)  Respiratory Respiratory Symptoms Reported: No symptoms reported    Endocrine Patient reports the following symptoms related to hypoglycemia or hyperglycemia : No symptoms reported    Gastrointestinal Gastrointestinal Symptoms Reported: No symptoms reported      Genitourinary Genitourinary Symptoms Reported: Not assessed    Integumentary Integumentary Symptoms Reported: No symptoms reported    Musculoskeletal Musculoskelatal Symptoms Reviewed:  Difficulty walking, Unsteady gait Additional Musculoskeletal Details: Fracture of R Femur - currently receiving PT/OT at home. Pain is controled with medication Musculoskeletal Conditions: Osteoarthritis, Fracture (R Femur) Musculoskeletal Management Strategies: Activity, Coping strategies, Medication therapy, Medical device Musculoskeletal Self-Management Outcome: 4 (good) Falls in the past year?: Yes Number of falls in past year: 1 or less Was there an injury with Fall?: Yes Fall Risk Category Calculator: 2 Patient Fall Risk Level: Moderate Fall Risk Patient at Risk for Falls Due to: History of fall(s), Impaired mobility Fall risk Follow up: Follow up appointment  Psychosocial Psychosocial Symptoms Reported:  (Reviewed MH services available through [facility] and encouraged pt to reach out to provider for referral if interventions are needed in the future.) Additional Psychological Details: Pt denies any symptoms of anxiety or depression Behavioral Management Strategies: Activity, Medication therapy, Support system Behavioral Health Self-Management Outcome: 4 (good) Major Change/Loss/Stressor/Fears (CP): Medical condition, self Quality of Family Relationships: helpful, involved, supportive Do you feel physically threatened by others?: No      12/27/2023    1:26 PM  Depression screen PHQ 2/9  Decreased Interest 0  Down, Depressed, Hopeless 0  PHQ - 2 Score 0    There were no vitals filed for this visit.  Medications Reviewed Today     Reviewed by Jens Molder, LCSW (Social Worker) on 12/27/23 at 1305  Med List Status: <None>   Medication Order Taking? Sig Documenting Provider Last Dose Status Informant  acetaminophen  (TYLENOL ) 500 MG tablet [id number] Yes Take 2 tablets (1,000 mg total) by mouth every 6 (six) hours as needed for mild pain (pain score 1-3) or moderate pain (pain score 4-6). Gawne, Meghan M, PA-C Taking Active   aspirin  EC 81 MG tablet [id number]  Yes Take 1 tablet (81 mg  total) by mouth 2 (two) times daily. To prevent blood clots for 30 days after surgery. Gawne, Meghan M, PA-C Taking Active   buPROPion  (WELLBUTRIN  SR) 200 MG 12 hr tablet [id number] Yes Take 1 tablet (200 mg total) by mouth daily.  Patient taking differently: Take 200 mg by mouth in the morning.   Glenora Laos, MD Taking Active Self, Pharmacy Records           Med Note [facility], TYELISHA L   Thu Nov 22, 2023  7:59 PM) LF: 07/03/23 for a 90ds  docusate sodium  (COLACE) 100 MG capsule [id number] Yes Take 100 mg by mouth in the morning. [provider] Taking Active Self, Pharmacy Records  Loratadine (CLARITIN PO) [id number] Yes Take 1 tablet by mouth in the morning. [provider] Taking Active Self, Pharmacy Records  losartan -hydrochlorothiazide  (HYZAAR) 100-12.5 MG tablet [id number] Yes Take 1 tablet by mouth daily.  Patient taking differently: Take 1 tablet by mouth in the morning.   Glenora Laos, MD Taking Active Self, Pharmacy Records           Med Note [facility], Myrtle Atta   Thu Nov 22, 2023  8:00 PM) LF: 09/16/23 for a 90ds  meloxicam  (MOBIC ) 15 MG tablet [id number] Yes Take 1 tablet (15 mg total) by mouth daily as needed for pain (and inflammation). Gawne, Meghan M, PA-C Taking Active   metFORMIN  (GLUCOPHAGE ) 500 MG tablet [id number] Yes Take 1 tablet (500 mg total) by mouth 2 (two) times daily with a meal. Glenora Laos, MD Taking Active Self, Pharmacy Records           Med Note [facility], TYELISHA L   Thu Nov 22, 2023  8:00 PM) LF: 09/16/23 for a 90ds  methocarbamol  (ROBAXIN -750) 750 MG tablet [id number] Yes Take 1 tablet (750 mg total) by mouth every 8 (eight) hours as needed for muscle spasms. Gawne, Meghan M, PA-C Taking Active   ondansetron  (ZOFRAN -ODT) 4 MG disintegrating tablet [id number] Yes Take 1 tablet (4 mg total) by mouth every 8 (eight) hours as needed for nausea or vomiting. Gawne, Meghan M, PA-C Taking Active   oxyCODONE  (ROXICODONE ) 5 MG immediate  release tablet [id number] No Take 1 tablet (5 mg total) by mouth every 4 (four) hours as needed for severe pain (pain score 7-10).  Patient not taking: Reported on 12/27/2023   Lore Rode, PA-C Not Taking Active   UNABLE TO FIND [id number]  C PAP for sleep apnea  [provider]  Active Self, Pharmacy Records  venlafaxine  (EFFEXOR ) 75 MG tablet [id number] Yes Take 1 tablet (75 mg total) by mouth daily.  Patient taking differently: Take 75 mg by mouth in the morning.   Glenora Laos, MD Taking Active Self, Pharmacy Records           Med Note [facility], Myrtle Atta   Thu Nov 22, 2023  8:00 PM) LF: 09/19/23 for a 90ds            Recommendation:   Monitor for changes in mood or care needs and reach out to provider for additional referral if needed.  Follow Up Plan:   Patient has met all care management goals. Care Management case will be closed. Patient has been provided contact information should new needs arise.   Hale Level, LCSW /[facility], [facility] Licensed Clinical Social Worker Care Coordinator [phone]

## 2023-12-28 ENCOUNTER — Ambulatory Visit: Admitting: Family Medicine

## 2023-12-28 ENCOUNTER — Encounter: Payer: Self-pay | Admitting: Family Medicine

## 2023-12-28 VITALS — BP 128/84 | HR 80 | Temp 98.1°F | Ht 66.0 in

## 2023-12-28 DIAGNOSIS — R42 Dizziness and giddiness: Secondary | ICD-10-CM | POA: Diagnosis not present

## 2023-12-28 DIAGNOSIS — N289 Disorder of kidney and ureter, unspecified: Secondary | ICD-10-CM

## 2023-12-28 DIAGNOSIS — I10 Essential (primary) hypertension: Secondary | ICD-10-CM | POA: Diagnosis not present

## 2023-12-28 DIAGNOSIS — S72002D Fracture of unspecified part of neck of left femur, subsequent encounter for closed fracture with routine healing: Secondary | ICD-10-CM | POA: Diagnosis not present

## 2023-12-28 DIAGNOSIS — F32A Depression, unspecified: Secondary | ICD-10-CM | POA: Diagnosis not present

## 2023-12-28 DIAGNOSIS — K219 Gastro-esophageal reflux disease without esophagitis: Secondary | ICD-10-CM | POA: Diagnosis not present

## 2023-12-28 DIAGNOSIS — D649 Anemia, unspecified: Secondary | ICD-10-CM | POA: Diagnosis not present

## 2023-12-28 DIAGNOSIS — E785 Hyperlipidemia, unspecified: Secondary | ICD-10-CM | POA: Diagnosis not present

## 2023-12-28 DIAGNOSIS — M1990 Unspecified osteoarthritis, unspecified site: Secondary | ICD-10-CM | POA: Diagnosis not present

## 2023-12-28 DIAGNOSIS — G4733 Obstructive sleep apnea (adult) (pediatric): Secondary | ICD-10-CM | POA: Diagnosis not present

## 2023-12-28 DIAGNOSIS — M8580 Other specified disorders of bone density and structure, unspecified site: Secondary | ICD-10-CM | POA: Diagnosis not present

## 2023-12-28 DIAGNOSIS — E669 Obesity, unspecified: Secondary | ICD-10-CM | POA: Diagnosis not present

## 2023-12-28 LAB — CBC WITH DIFFERENTIAL/PLATELET
Basophils Absolute: 0.1 10*3/uL (ref 0.0–0.1)
Basophils Relative: 1.4 % (ref 0.0–3.0)
Eosinophils Absolute: 0.1 10*3/uL (ref 0.0–0.7)
Eosinophils Relative: 1.6 % (ref 0.0–5.0)
HCT: 38.8 % (ref 36.0–46.0)
Hemoglobin: 12.6 g/dL (ref 12.0–15.0)
Lymphocytes Relative: 19.8 % (ref 12.0–46.0)
Lymphs Abs: 0.9 10*3/uL (ref 0.7–4.0)
MCHC: 32.5 g/dL (ref 30.0–36.0)
MCV: 86.6 fl (ref 78.0–100.0)
Monocytes Absolute: 0.5 10*3/uL (ref 0.1–1.0)
Monocytes Relative: 11.9 % (ref 3.0–12.0)
Neutro Abs: 2.9 10*3/uL (ref 1.4–7.7)
Neutrophils Relative %: 65.3 % (ref 43.0–77.0)
Platelets: 307 10*3/uL (ref 150.0–400.0)
RBC: 4.48 Mil/uL (ref 3.87–5.11)
RDW: 16.3 % — ABNORMAL HIGH (ref 11.5–15.5)
WBC: 4.4 10*3/uL (ref 4.0–10.5)

## 2023-12-28 LAB — COMPREHENSIVE METABOLIC PANEL WITH GFR
ALT: 13 U/L (ref 0–35)
AST: 18 U/L (ref 0–37)
Albumin: 4.5 g/dL (ref 3.5–5.2)
Alkaline Phosphatase: 259 U/L — ABNORMAL HIGH (ref 39–117)
BUN: 37 mg/dL — ABNORMAL HIGH (ref 6–23)
CO2: 26 meq/L (ref 19–32)
Calcium: 9.6 mg/dL (ref 8.4–10.5)
Chloride: 103 meq/L (ref 96–112)
Creatinine, Ser: 1.15 mg/dL (ref 0.40–1.20)
GFR: 49.2 mL/min — ABNORMAL LOW (ref >60.00)
Glucose, Bld: 89 mg/dL (ref 70–99)
Potassium: 4.2 meq/L (ref 3.5–5.1)
Sodium: 139 meq/L (ref 135–145)
Total Bilirubin: 0.4 mg/dL (ref 0.2–1.2)
Total Protein: 7.6 g/dL (ref 6.0–8.3)

## 2023-12-28 NOTE — Patient Instructions (Addendum)
-  It was so great to see you today and I am glad you are recovering all that you have been through lately. -Ordered labs to assess your previous anemia and decreased kidney function while admitted for your fall. Office will call with lab results and you will see them on MyChart.  -Discussed about lightheadedness. Suspect this could be coming from being anxious when you are walking around with being afraid of falling. You could be holding your breath or breathing too fast from being anxious that can be causing you to feel lightheaded. Recommend to pause when feeling anxious while ambulating and taking 5 deep breaths through your nose and out through your mouth. See if lightheadedness and anxiety improve. Also, recommend to monitor your blood pressure when you sit back down after ambulating. If symptoms do not improve or blood pressure is elevated/low, follow up.  -If chest pain occurs again, recommend a follow up or emergency department.  -Follow up in 1-2 months for a physical.

## 2023-12-28 NOTE — Progress Notes (Signed)
 Established Patient Office Visit   Subjective:  Patient ID: Cheryl [name], female    DOB: 03-22-1956  Age: 68 y.o. MRN: [id number]  Chief Complaint  Patient presents with   Medical Management of Chronic Issues    Rehab follow up     HPI Patient is following up from a fall that resulted in surgery and rehab. Patient had a fall on 04/10 while going down a set of stairs. She endured a closed fracture of right femur. She had an open reduction internal fixation distal femur fracture on 04/11 by Dr. Randal Bury. During admission, her HGB dropped to 7.6, had a blood transfusion. Also, kidney function decreased during admission. Discharged from [facility] on 04/16 to [facility] for rehabilitation. Then, discharged home on 12/13/2023.   Currently, she is ambulating with a walker. She has home health, OT, and PT coming to her home. She reports she has 2 more nurse visits, once a week; OT is scheduled today for the last visit; and PT is coming out to mid June with bi-weekly visits that will go down to once a week. She reports she has a follow up with Dr. Abigail Abler on 06/04.   Patient reports she had chest pain or indigestion twice, last time being at home about 2 weeks ago while lying down. Also, reports lightheadedness that started while at [facility]. This occurs about every day when ambulating around. Once she rest , it improves. Also, it has improved some since it started at [facility]. Noticed when ambulating around, she will become anxious. She is afraid of falling again, especially since she had left knee surgery prior to the fall in April. She is drinking about 64oz of water a day and adding IV therapy into her water. Denies being lightheaded when changing positions and does not have  ROS See HPI above     Objective:   BP 128/84   Pulse 80   Temp 98.1 F (36.7 C) (Oral)   Ht 5\' 6"  (1.676 m)   SpO2 98%   BMI 35.94 kg/m    Physical Exam Vitals reviewed.  Constitutional:       General: She is not in acute distress.    Appearance: Normal appearance. She is obese. She is not ill-appearing, toxic-appearing or diaphoretic.  HENT:     Head: Normocephalic and atraumatic.  Eyes:     General:        Right eye: No discharge.        Left eye: No discharge.     Conjunctiva/sclera: Conjunctivae normal.  Cardiovascular:     Rate and Rhythm: Normal rate and regular rhythm.     Heart sounds: Normal heart sounds. No murmur heard.    No friction rub. No gallop.  Pulmonary:     Effort: Pulmonary effort is normal. No respiratory distress.     Breath sounds: Normal breath sounds.  Musculoskeletal:        General: Normal range of motion.  Skin:    General: Skin is warm and dry.     Comments: Mid line surgical scar on left knee with no signs of infection   Neurological:     General: No focal deficit present.     Mental Status: She is alert and oriented to person, place, and time. Mental status is at baseline.     Gait: Gait abnormal Otho Blitz).  Psychiatric:        Mood and Affect: Mood normal.  Behavior: Behavior normal.        Thought Content: Thought content normal.        Judgment: Judgment normal.      Assessment & Plan:  Function kidney decreased -     Comprehensive metabolic panel with GFR  Anemia, unspecified type -     CBC with Differential/Platelet  Lightheadedness  -Ordered labs (CBC and CMP) to assess previous anemia and decreased kidney function during admission in April. She reports she has not had labs reassessed since hospital visit.   -Discussed about lightheadedness. Suspect this could be coming from being anxious when she is walking around with being afraid of falling. She could be holding her breath or breathing too fast from being anxious that can be causing her to feel lightheaded. Recommend to pause when feeling anxious while ambulating and taking 5 deep breaths. See if lightheadedness and anxiety improve. Also, recommend to monitor her  blood pressure when she sit back down after ambulating. If symptoms do not improve or BP is elevated/low, follow up.  -If CP occurs again, recommend a follow up or ED.  -Follow up in 1-2 months for a physical.   Return in about 2 months (around 02/27/2024) for 1-2 months for , physical.   Geri Hepler, NP

## 2023-12-28 NOTE — Patient Instructions (Signed)
 Visit Information  Thank you for taking time to visit with me today. Please don't hesitate to contact me if I can be of assistance to you before our next scheduled appointment.   Please call the care guide team at [phone] if you need to cancel or reschedule your appointment.      Please call the Suicide and Crisis Lifeline: 988 if you are experiencing a Mental Health or Behavioral Health Crisis or need someone to talk to.  Patient verbalizes understanding of instructions and care plan provided today and agrees to view in MyChart. Active MyChart status and patient understanding of how to access instructions and care plan via MyChart confirmed with patient.     Hale Level, LCSW [place]/[facility], [facility] Licensed Clinical Social Worker Care Coordinator [phone]

## 2023-12-31 ENCOUNTER — Ambulatory Visit: Payer: Self-pay | Admitting: Family Medicine

## 2023-12-31 DIAGNOSIS — E669 Obesity, unspecified: Secondary | ICD-10-CM | POA: Diagnosis not present

## 2023-12-31 DIAGNOSIS — K219 Gastro-esophageal reflux disease without esophagitis: Secondary | ICD-10-CM | POA: Diagnosis not present

## 2023-12-31 DIAGNOSIS — N289 Disorder of kidney and ureter, unspecified: Secondary | ICD-10-CM

## 2023-12-31 DIAGNOSIS — G4733 Obstructive sleep apnea (adult) (pediatric): Secondary | ICD-10-CM | POA: Diagnosis not present

## 2023-12-31 DIAGNOSIS — I10 Essential (primary) hypertension: Secondary | ICD-10-CM | POA: Diagnosis not present

## 2023-12-31 DIAGNOSIS — S72002D Fracture of unspecified part of neck of left femur, subsequent encounter for closed fracture with routine healing: Secondary | ICD-10-CM | POA: Diagnosis not present

## 2023-12-31 DIAGNOSIS — F32A Depression, unspecified: Secondary | ICD-10-CM | POA: Diagnosis not present

## 2023-12-31 DIAGNOSIS — M1990 Unspecified osteoarthritis, unspecified site: Secondary | ICD-10-CM | POA: Diagnosis not present

## 2023-12-31 DIAGNOSIS — M8580 Other specified disorders of bone density and structure, unspecified site: Secondary | ICD-10-CM | POA: Diagnosis not present

## 2023-12-31 DIAGNOSIS — E785 Hyperlipidemia, unspecified: Secondary | ICD-10-CM | POA: Diagnosis not present

## 2023-12-31 NOTE — Addendum Note (Signed)
 Addended by: Teodoro Feller B on: 12/31/2023 01:41 PM   Modules accepted: Orders

## 2024-01-01 DIAGNOSIS — G4733 Obstructive sleep apnea (adult) (pediatric): Secondary | ICD-10-CM | POA: Diagnosis not present

## 2024-01-01 DIAGNOSIS — E785 Hyperlipidemia, unspecified: Secondary | ICD-10-CM | POA: Diagnosis not present

## 2024-01-01 DIAGNOSIS — E669 Obesity, unspecified: Secondary | ICD-10-CM | POA: Diagnosis not present

## 2024-01-01 DIAGNOSIS — S72002D Fracture of unspecified part of neck of left femur, subsequent encounter for closed fracture with routine healing: Secondary | ICD-10-CM | POA: Diagnosis not present

## 2024-01-01 DIAGNOSIS — F32A Depression, unspecified: Secondary | ICD-10-CM | POA: Diagnosis not present

## 2024-01-01 DIAGNOSIS — M8580 Other specified disorders of bone density and structure, unspecified site: Secondary | ICD-10-CM | POA: Diagnosis not present

## 2024-01-01 DIAGNOSIS — M1990 Unspecified osteoarthritis, unspecified site: Secondary | ICD-10-CM | POA: Diagnosis not present

## 2024-01-01 DIAGNOSIS — I10 Essential (primary) hypertension: Secondary | ICD-10-CM | POA: Diagnosis not present

## 2024-01-01 DIAGNOSIS — K219 Gastro-esophageal reflux disease without esophagitis: Secondary | ICD-10-CM | POA: Diagnosis not present

## 2024-01-02 ENCOUNTER — Encounter (INDEPENDENT_AMBULATORY_CARE_PROVIDER_SITE_OTHER): Payer: Self-pay | Admitting: Family Medicine

## 2024-01-02 ENCOUNTER — Ambulatory Visit (INDEPENDENT_AMBULATORY_CARE_PROVIDER_SITE_OTHER): Admitting: Family Medicine

## 2024-01-02 VITALS — BP 123/77 | HR 79 | Temp 98.2°F | Ht 66.0 in | Wt 220.0 lb

## 2024-01-02 DIAGNOSIS — E669 Obesity, unspecified: Secondary | ICD-10-CM

## 2024-01-02 DIAGNOSIS — S7292XA Unspecified fracture of left femur, initial encounter for closed fracture: Secondary | ICD-10-CM | POA: Diagnosis not present

## 2024-01-02 DIAGNOSIS — F5089 Other specified eating disorder: Secondary | ICD-10-CM | POA: Diagnosis not present

## 2024-01-02 DIAGNOSIS — E119 Type 2 diabetes mellitus without complications: Secondary | ICD-10-CM

## 2024-01-02 DIAGNOSIS — E66813 Obesity, class 3: Secondary | ICD-10-CM

## 2024-01-02 DIAGNOSIS — Z6835 Body mass index (BMI) 35.0-35.9, adult: Secondary | ICD-10-CM

## 2024-01-02 DIAGNOSIS — Z6836 Body mass index (BMI) 36.0-36.9, adult: Secondary | ICD-10-CM

## 2024-01-02 DIAGNOSIS — Z7984 Long term (current) use of oral hypoglycemic drugs: Secondary | ICD-10-CM | POA: Diagnosis not present

## 2024-01-02 DIAGNOSIS — E1169 Type 2 diabetes mellitus with other specified complication: Secondary | ICD-10-CM

## 2024-01-02 DIAGNOSIS — F3289 Other specified depressive episodes: Secondary | ICD-10-CM

## 2024-01-02 NOTE — Progress Notes (Signed)
 Office: [phone]  /  Fax: [phone]  WEIGHT SUMMARY AND BIOMETRICS  Anthropometric Measurements Height: 5\' 6"  (1.676 m) Weight: 220 lb (99.8 kg) BMI (Calculated): 35.53 Weight at Last Visit: 223 lb Weight Lost Since Last Visit: 3 lb Weight Gained Since Last Visit: 0 Starting Weight: 281 lb Total Weight Loss (lbs): 61 lb (27.7 kg) Peak Weight: 285 lb   Body Composition  Body Fat %: 48.7 % Fat Mass (lbs): 107.2 lbs Muscle Mass (lbs): 107 lbs Total Body Water (lbs): 81.8 lbs Visceral Fat Rating : 15   Other Clinical Data Fasting: no Labs: no Today's Visit #: 55 Starting Date: 02/06/18    Chief Complaint: OBESITY    History of Present Illness Cheryl Knights "[name]" is a 68 year old female who presents for obesity treatment and progress assessment.  She has lost three pounds in the last two months despite significant challenges, including a recent injury and recovery process.  Two months ago, she fell at church and sustained a left femur fracture, necessitating surgical repair. She is currently engaged in daily physical therapy exercises and is on a 50% weight-bearing restriction on her left leg. Her right knee is also problematic, similar to her left knee before surgery, but it is improving. She has discontinued all pain medications except for Tylenol , which she takes four times a day.  During her recovery, she experienced significant weight fluctuations. Initially, she was informed of a weight of 265 pounds in rehab, but her home scale indicated 250 pounds. She is currently at 220 pounds. She attributes some of the weight fluctuation to fluid retention and a blood transfusion received during her hospital stay. She is concerned about maintaining her BMI for future knee surgery.  She reports decreased fluid intake due to the inconvenience of bathroom trips, which may be contributing to dehydration. Recent lab work showed an elevated BUN.  She manages her diet  with the help of her spouse, who provides portion-controlled meals. She has been consuming more carbohydrates than usual during her stay in rehab. She is not yet able to focus on meal planning and preparation but is working on portion control and Oncologist. She is aware of her emotional eating habits and is trying to manage them by portioning snacks and being mindful of her eating triggers.  She has stopped taking meloxicam  due to concerns about kidney health and continues to take Tylenol  for pain management. She is also on bupropion  (Wellbutrin ) for emotional eating behaviors.      PHYSICAL EXAM:  Blood pressure 123/77, pulse 79, temperature 98.2 F (36.8 C), height 5\' 6"  (1.676 m), weight 220 lb (99.8 kg), SpO2 98%. Body mass index is 35.51 kg/m.  DIAGNOSTIC DATA REVIEWED:  BMET    Component Value Date/Time   NA 139 12/28/2023 0948   NA 140 05/08/2023 0910   K 4.2 12/28/2023 0948   CL 103 12/28/2023 0948   CO2 26 12/28/2023 0948   GLUCOSE 89 12/28/2023 0948   BUN 37 (H) 12/28/2023 0948   BUN 25 05/08/2023 0910   CREATININE 1.15 12/28/2023 0948   CREATININE 0.81 06/08/2016 1353   CALCIUM 9.6 12/28/2023 0948   GFRNONAA >60 11/26/2023 1548   GFRAA 106 06/10/2020 0953   Lab Results  Component Value Date   HGBA1C 5.3 05/08/2023   HGBA1C 5.8 (H) 02/06/2018   Lab Results  Component Value Date   INSULIN  9.3 05/08/2023   INSULIN  8.7 02/06/2018   Lab Results  Component Value  Date   TSH 3.680 11/16/2022   CBC    Component Value Date/Time   WBC 4.4 12/28/2023 0948   RBC 4.48 12/28/2023 0948   HGB 12.6 12/28/2023 0948   HGB 14.2 04/13/2022 1110   HGB 14.3 04/02/2014 0937   HCT 38.8 12/28/2023 0948   HCT 43.6 04/13/2022 1110   PLT 307.0 12/28/2023 0948   PLT 237 04/13/2022 1110   MCV 86.6 12/28/2023 0948   MCV 88 04/13/2022 1110   MCH 28.0 11/26/2023 1548   MCHC 32.5 12/28/2023 0948   RDW 16.3 (H) 12/28/2023 0948   RDW 13.4 04/13/2022 1110   Iron  Studies No results found for: "IRON", "TIBC", "FERRITIN", "IRONPCTSAT" Lipid Panel     Component Value Date/Time   CHOL 240 (H) 05/08/2023 0910   TRIG 98 05/08/2023 0910   HDL 76 05/08/2023 0910   CHOLHDL 3.1 09/27/2017 1404   CHOLHDL 3.4 06/08/2016 1353   VLDL 19 06/08/2016 1353   LDLCALC 147 (H) 05/08/2023 0910   Hepatic Function Panel     Component Value Date/Time   PROT 7.6 12/28/2023 0948   PROT 6.9 05/08/2023 0910   ALBUMIN  4.5 12/28/2023 0948   ALBUMIN  4.2 05/08/2023 0910   AST 18 12/28/2023 0948   ALT 13 12/28/2023 0948   ALKPHOS 259 (H) 12/28/2023 0948   BILITOT 0.4 12/28/2023 0948   BILITOT 0.4 05/08/2023 0910      Component Value Date/Time   TSH 3.680 11/16/2022 0926   Nutritional Lab Results  Component Value Date   VD25OH 62.9 05/08/2023   VD25OH 50.4 11/16/2022   VD25OH 38.1 04/13/2022     Assessment and Plan Assessment & Plan Left Femur Fracture Sustained from a fall at church, requiring surgical repair. Currently undergoing physical therapy with a good prognosis for full recovery. Weight-bearing is limited to 50% on the left leg. Using a walker for mobility and progressing well in physical therapy. - Continue prescribed physical therapy exercises. - Schedule bathroom breaks to prevent falls and increase hydration but avoid accidents..  Obesity Ongoing management with a recent weight loss of three pounds over two months despite challenges from a left femur fracture and surgery. Current weight is 250 pounds. Unable to focus on meal planning due to recovery. Emotional eating is recognized, and portion control is practiced. Hydration is a concern due to reduced fluid intake to avoid frequent bathroom trips, risking dehydration and renal issues. - Continue portion control and smart food choices. - Encourage hydration to prevent dehydration and potential renal issues. - Incorporate more protein into diet, such as yogurt or cheese, to aid in  healing.  Emotional Eating Behaviors Emotional eating is acknowledged, with efforts to control portion sizes and recognize anxiety-driven eating. Strategies include eating protein before snacks. - Continue Wellbutrin  and Effexor  for emotional eating behaviors - Encourage eating protein-rich foods before snacks to manage cravings.  Type 2 Diabetes Mellitus Blood glucose levels were frequently monitored during hospital stay. Levels were elevated due to injury but are now well-managed. Not currently on insulin . - Continue monitoring blood glucose levels. - Maintain current diabetes management plan.      She was informed of the importance of frequent follow up visits to maximize her success with intensive lifestyle modifications for her multiple health conditions.    Jasmine Mesi, MD

## 2024-01-03 DIAGNOSIS — S72002D Fracture of unspecified part of neck of left femur, subsequent encounter for closed fracture with routine healing: Secondary | ICD-10-CM | POA: Diagnosis not present

## 2024-01-03 DIAGNOSIS — E785 Hyperlipidemia, unspecified: Secondary | ICD-10-CM | POA: Diagnosis not present

## 2024-01-03 DIAGNOSIS — M8580 Other specified disorders of bone density and structure, unspecified site: Secondary | ICD-10-CM | POA: Diagnosis not present

## 2024-01-03 DIAGNOSIS — K219 Gastro-esophageal reflux disease without esophagitis: Secondary | ICD-10-CM | POA: Diagnosis not present

## 2024-01-03 DIAGNOSIS — M1990 Unspecified osteoarthritis, unspecified site: Secondary | ICD-10-CM | POA: Diagnosis not present

## 2024-01-03 DIAGNOSIS — G4733 Obstructive sleep apnea (adult) (pediatric): Secondary | ICD-10-CM | POA: Diagnosis not present

## 2024-01-03 DIAGNOSIS — E669 Obesity, unspecified: Secondary | ICD-10-CM | POA: Diagnosis not present

## 2024-01-03 DIAGNOSIS — I10 Essential (primary) hypertension: Secondary | ICD-10-CM | POA: Diagnosis not present

## 2024-01-03 DIAGNOSIS — F32A Depression, unspecified: Secondary | ICD-10-CM | POA: Diagnosis not present

## 2024-01-09 DIAGNOSIS — E785 Hyperlipidemia, unspecified: Secondary | ICD-10-CM | POA: Diagnosis not present

## 2024-01-09 DIAGNOSIS — K219 Gastro-esophageal reflux disease without esophagitis: Secondary | ICD-10-CM | POA: Diagnosis not present

## 2024-01-09 DIAGNOSIS — S72002D Fracture of unspecified part of neck of left femur, subsequent encounter for closed fracture with routine healing: Secondary | ICD-10-CM | POA: Diagnosis not present

## 2024-01-09 DIAGNOSIS — M8580 Other specified disorders of bone density and structure, unspecified site: Secondary | ICD-10-CM | POA: Diagnosis not present

## 2024-01-09 DIAGNOSIS — G4733 Obstructive sleep apnea (adult) (pediatric): Secondary | ICD-10-CM | POA: Diagnosis not present

## 2024-01-09 DIAGNOSIS — I10 Essential (primary) hypertension: Secondary | ICD-10-CM | POA: Diagnosis not present

## 2024-01-09 DIAGNOSIS — E669 Obesity, unspecified: Secondary | ICD-10-CM | POA: Diagnosis not present

## 2024-01-09 DIAGNOSIS — F32A Depression, unspecified: Secondary | ICD-10-CM | POA: Diagnosis not present

## 2024-01-09 DIAGNOSIS — M1990 Unspecified osteoarthritis, unspecified site: Secondary | ICD-10-CM | POA: Diagnosis not present

## 2024-01-10 DIAGNOSIS — M1990 Unspecified osteoarthritis, unspecified site: Secondary | ICD-10-CM | POA: Diagnosis not present

## 2024-01-10 DIAGNOSIS — F32A Depression, unspecified: Secondary | ICD-10-CM | POA: Diagnosis not present

## 2024-01-10 DIAGNOSIS — H26492 Other secondary cataract, left eye: Secondary | ICD-10-CM | POA: Diagnosis not present

## 2024-01-10 DIAGNOSIS — K219 Gastro-esophageal reflux disease without esophagitis: Secondary | ICD-10-CM | POA: Diagnosis not present

## 2024-01-10 DIAGNOSIS — E785 Hyperlipidemia, unspecified: Secondary | ICD-10-CM | POA: Diagnosis not present

## 2024-01-10 DIAGNOSIS — S72002D Fracture of unspecified part of neck of left femur, subsequent encounter for closed fracture with routine healing: Secondary | ICD-10-CM | POA: Diagnosis not present

## 2024-01-10 DIAGNOSIS — Z961 Presence of intraocular lens: Secondary | ICD-10-CM | POA: Diagnosis not present

## 2024-01-10 DIAGNOSIS — G4733 Obstructive sleep apnea (adult) (pediatric): Secondary | ICD-10-CM | POA: Diagnosis not present

## 2024-01-10 DIAGNOSIS — I10 Essential (primary) hypertension: Secondary | ICD-10-CM | POA: Diagnosis not present

## 2024-01-10 DIAGNOSIS — H35372 Puckering of macula, left eye: Secondary | ICD-10-CM | POA: Diagnosis not present

## 2024-01-10 DIAGNOSIS — E669 Obesity, unspecified: Secondary | ICD-10-CM | POA: Diagnosis not present

## 2024-01-10 DIAGNOSIS — M8580 Other specified disorders of bone density and structure, unspecified site: Secondary | ICD-10-CM | POA: Diagnosis not present

## 2024-01-10 DIAGNOSIS — H18413 Arcus senilis, bilateral: Secondary | ICD-10-CM | POA: Diagnosis not present

## 2024-01-16 ENCOUNTER — Other Ambulatory Visit (INDEPENDENT_AMBULATORY_CARE_PROVIDER_SITE_OTHER)

## 2024-01-16 ENCOUNTER — Ambulatory Visit: Payer: Self-pay | Admitting: Family Medicine

## 2024-01-16 DIAGNOSIS — M8580 Other specified disorders of bone density and structure, unspecified site: Secondary | ICD-10-CM | POA: Diagnosis not present

## 2024-01-16 DIAGNOSIS — N289 Disorder of kidney and ureter, unspecified: Secondary | ICD-10-CM | POA: Diagnosis not present

## 2024-01-16 DIAGNOSIS — M1990 Unspecified osteoarthritis, unspecified site: Secondary | ICD-10-CM | POA: Diagnosis not present

## 2024-01-16 DIAGNOSIS — I10 Essential (primary) hypertension: Secondary | ICD-10-CM | POA: Diagnosis not present

## 2024-01-16 DIAGNOSIS — S72002D Fracture of unspecified part of neck of left femur, subsequent encounter for closed fracture with routine healing: Secondary | ICD-10-CM | POA: Diagnosis not present

## 2024-01-16 DIAGNOSIS — E785 Hyperlipidemia, unspecified: Secondary | ICD-10-CM | POA: Diagnosis not present

## 2024-01-16 DIAGNOSIS — E669 Obesity, unspecified: Secondary | ICD-10-CM | POA: Diagnosis not present

## 2024-01-16 DIAGNOSIS — S72452D Displaced supracondylar fracture without intracondylar extension of lower end of left femur, subsequent encounter for closed fracture with routine healing: Secondary | ICD-10-CM | POA: Diagnosis not present

## 2024-01-16 DIAGNOSIS — G4733 Obstructive sleep apnea (adult) (pediatric): Secondary | ICD-10-CM | POA: Diagnosis not present

## 2024-01-16 DIAGNOSIS — K219 Gastro-esophageal reflux disease without esophagitis: Secondary | ICD-10-CM | POA: Diagnosis not present

## 2024-01-16 DIAGNOSIS — S72451D Displaced supracondylar fracture without intracondylar extension of lower end of right femur, subsequent encounter for closed fracture with routine healing: Secondary | ICD-10-CM | POA: Diagnosis not present

## 2024-01-16 DIAGNOSIS — F32A Depression, unspecified: Secondary | ICD-10-CM | POA: Diagnosis not present

## 2024-01-16 LAB — BASIC METABOLIC PANEL WITH GFR
BUN: 26 mg/dL — ABNORMAL HIGH (ref 6–23)
CO2: 28 meq/L (ref 19–32)
Calcium: 9.9 mg/dL (ref 8.4–10.5)
Chloride: 104 meq/L (ref 96–112)
Creatinine, Ser: 0.97 mg/dL (ref 0.40–1.20)
GFR: 60.33 mL/min (ref >60.00)
Glucose, Bld: 88 mg/dL (ref 70–99)
Potassium: 4.4 meq/L (ref 3.5–5.1)
Sodium: 141 meq/L (ref 135–145)

## 2024-01-17 DIAGNOSIS — E785 Hyperlipidemia, unspecified: Secondary | ICD-10-CM | POA: Diagnosis not present

## 2024-01-17 DIAGNOSIS — M1990 Unspecified osteoarthritis, unspecified site: Secondary | ICD-10-CM | POA: Diagnosis not present

## 2024-01-17 DIAGNOSIS — K219 Gastro-esophageal reflux disease without esophagitis: Secondary | ICD-10-CM | POA: Diagnosis not present

## 2024-01-17 DIAGNOSIS — M8580 Other specified disorders of bone density and structure, unspecified site: Secondary | ICD-10-CM | POA: Diagnosis not present

## 2024-01-17 DIAGNOSIS — E669 Obesity, unspecified: Secondary | ICD-10-CM | POA: Diagnosis not present

## 2024-01-17 DIAGNOSIS — F32A Depression, unspecified: Secondary | ICD-10-CM | POA: Diagnosis not present

## 2024-01-17 DIAGNOSIS — I10 Essential (primary) hypertension: Secondary | ICD-10-CM | POA: Diagnosis not present

## 2024-01-17 DIAGNOSIS — S72002D Fracture of unspecified part of neck of left femur, subsequent encounter for closed fracture with routine healing: Secondary | ICD-10-CM | POA: Diagnosis not present

## 2024-01-17 DIAGNOSIS — G4733 Obstructive sleep apnea (adult) (pediatric): Secondary | ICD-10-CM | POA: Diagnosis not present

## 2024-01-21 DIAGNOSIS — F32A Depression, unspecified: Secondary | ICD-10-CM | POA: Diagnosis not present

## 2024-01-21 DIAGNOSIS — M1990 Unspecified osteoarthritis, unspecified site: Secondary | ICD-10-CM | POA: Diagnosis not present

## 2024-01-21 DIAGNOSIS — M8580 Other specified disorders of bone density and structure, unspecified site: Secondary | ICD-10-CM | POA: Diagnosis not present

## 2024-01-21 DIAGNOSIS — S72002D Fracture of unspecified part of neck of left femur, subsequent encounter for closed fracture with routine healing: Secondary | ICD-10-CM | POA: Diagnosis not present

## 2024-01-21 DIAGNOSIS — I10 Essential (primary) hypertension: Secondary | ICD-10-CM | POA: Diagnosis not present

## 2024-01-21 DIAGNOSIS — E785 Hyperlipidemia, unspecified: Secondary | ICD-10-CM | POA: Diagnosis not present

## 2024-01-21 DIAGNOSIS — E669 Obesity, unspecified: Secondary | ICD-10-CM | POA: Diagnosis not present

## 2024-01-21 DIAGNOSIS — G4733 Obstructive sleep apnea (adult) (pediatric): Secondary | ICD-10-CM | POA: Diagnosis not present

## 2024-01-21 DIAGNOSIS — K219 Gastro-esophageal reflux disease without esophagitis: Secondary | ICD-10-CM | POA: Diagnosis not present

## 2024-01-31 DIAGNOSIS — G4733 Obstructive sleep apnea (adult) (pediatric): Secondary | ICD-10-CM | POA: Diagnosis not present

## 2024-01-31 DIAGNOSIS — I10 Essential (primary) hypertension: Secondary | ICD-10-CM | POA: Diagnosis not present

## 2024-01-31 DIAGNOSIS — E669 Obesity, unspecified: Secondary | ICD-10-CM | POA: Diagnosis not present

## 2024-01-31 DIAGNOSIS — S72002D Fracture of unspecified part of neck of left femur, subsequent encounter for closed fracture with routine healing: Secondary | ICD-10-CM | POA: Diagnosis not present

## 2024-01-31 DIAGNOSIS — E785 Hyperlipidemia, unspecified: Secondary | ICD-10-CM | POA: Diagnosis not present

## 2024-01-31 DIAGNOSIS — F32A Depression, unspecified: Secondary | ICD-10-CM | POA: Diagnosis not present

## 2024-01-31 DIAGNOSIS — M8580 Other specified disorders of bone density and structure, unspecified site: Secondary | ICD-10-CM | POA: Diagnosis not present

## 2024-01-31 DIAGNOSIS — K219 Gastro-esophageal reflux disease without esophagitis: Secondary | ICD-10-CM | POA: Diagnosis not present

## 2024-01-31 DIAGNOSIS — M1990 Unspecified osteoarthritis, unspecified site: Secondary | ICD-10-CM | POA: Diagnosis not present

## 2024-02-06 ENCOUNTER — Ambulatory Visit (INDEPENDENT_AMBULATORY_CARE_PROVIDER_SITE_OTHER): Admitting: Family Medicine

## 2024-02-07 DIAGNOSIS — M1990 Unspecified osteoarthritis, unspecified site: Secondary | ICD-10-CM | POA: Diagnosis not present

## 2024-02-07 DIAGNOSIS — E785 Hyperlipidemia, unspecified: Secondary | ICD-10-CM | POA: Diagnosis not present

## 2024-02-07 DIAGNOSIS — G4733 Obstructive sleep apnea (adult) (pediatric): Secondary | ICD-10-CM | POA: Diagnosis not present

## 2024-02-07 DIAGNOSIS — M8580 Other specified disorders of bone density and structure, unspecified site: Secondary | ICD-10-CM | POA: Diagnosis not present

## 2024-02-07 DIAGNOSIS — I10 Essential (primary) hypertension: Secondary | ICD-10-CM | POA: Diagnosis not present

## 2024-02-07 DIAGNOSIS — S72002D Fracture of unspecified part of neck of left femur, subsequent encounter for closed fracture with routine healing: Secondary | ICD-10-CM | POA: Diagnosis not present

## 2024-02-07 DIAGNOSIS — E669 Obesity, unspecified: Secondary | ICD-10-CM | POA: Diagnosis not present

## 2024-02-07 DIAGNOSIS — F32A Depression, unspecified: Secondary | ICD-10-CM | POA: Diagnosis not present

## 2024-02-07 DIAGNOSIS — K219 Gastro-esophageal reflux disease without esophagitis: Secondary | ICD-10-CM | POA: Diagnosis not present

## 2024-02-11 DIAGNOSIS — S72002D Fracture of unspecified part of neck of left femur, subsequent encounter for closed fracture with routine healing: Secondary | ICD-10-CM | POA: Diagnosis not present

## 2024-02-11 DIAGNOSIS — E669 Obesity, unspecified: Secondary | ICD-10-CM | POA: Diagnosis not present

## 2024-02-11 DIAGNOSIS — M1990 Unspecified osteoarthritis, unspecified site: Secondary | ICD-10-CM | POA: Diagnosis not present

## 2024-02-11 DIAGNOSIS — K219 Gastro-esophageal reflux disease without esophagitis: Secondary | ICD-10-CM | POA: Diagnosis not present

## 2024-02-11 DIAGNOSIS — F32A Depression, unspecified: Secondary | ICD-10-CM | POA: Diagnosis not present

## 2024-02-11 DIAGNOSIS — E785 Hyperlipidemia, unspecified: Secondary | ICD-10-CM | POA: Diagnosis not present

## 2024-02-11 DIAGNOSIS — I10 Essential (primary) hypertension: Secondary | ICD-10-CM | POA: Diagnosis not present

## 2024-02-11 DIAGNOSIS — M8580 Other specified disorders of bone density and structure, unspecified site: Secondary | ICD-10-CM | POA: Diagnosis not present

## 2024-02-11 DIAGNOSIS — G4733 Obstructive sleep apnea (adult) (pediatric): Secondary | ICD-10-CM | POA: Diagnosis not present

## 2024-02-12 ENCOUNTER — Encounter (INDEPENDENT_AMBULATORY_CARE_PROVIDER_SITE_OTHER): Payer: Self-pay

## 2024-02-12 ENCOUNTER — Encounter (INDEPENDENT_AMBULATORY_CARE_PROVIDER_SITE_OTHER): Payer: Self-pay | Admitting: Family Medicine

## 2024-02-12 ENCOUNTER — Ambulatory Visit (INDEPENDENT_AMBULATORY_CARE_PROVIDER_SITE_OTHER): Admitting: Family Medicine

## 2024-02-12 VITALS — BP 139/62 | HR 50 | Temp 98.0°F | Ht 66.0 in | Wt 220.0 lb

## 2024-02-12 DIAGNOSIS — F5089 Other specified eating disorder: Secondary | ICD-10-CM

## 2024-02-12 DIAGNOSIS — E669 Obesity, unspecified: Secondary | ICD-10-CM | POA: Diagnosis not present

## 2024-02-12 DIAGNOSIS — I10 Essential (primary) hypertension: Secondary | ICD-10-CM | POA: Diagnosis not present

## 2024-02-12 DIAGNOSIS — R7303 Prediabetes: Secondary | ICD-10-CM | POA: Diagnosis not present

## 2024-02-12 DIAGNOSIS — Z6835 Body mass index (BMI) 35.0-35.9, adult: Secondary | ICD-10-CM

## 2024-02-12 DIAGNOSIS — E860 Dehydration: Secondary | ICD-10-CM | POA: Diagnosis not present

## 2024-02-12 DIAGNOSIS — F3289 Other specified depressive episodes: Secondary | ICD-10-CM

## 2024-02-12 MED ORDER — BUPROPION HCL ER (SR) 200 MG PO TB12
200.0000 mg | ORAL_TABLET | Freq: Every day | ORAL | 0 refills | Status: DC
Start: 2024-02-12 — End: 2024-03-26

## 2024-02-12 MED ORDER — VENLAFAXINE HCL 75 MG PO TABS: 75.0000 mg | ORAL_TABLET | Freq: Every day | ORAL | 0 refills | Status: DC

## 2024-02-12 MED ORDER — METFORMIN HCL 500 MG PO TABS: 500.0000 mg | ORAL_TABLET | Freq: Two times a day (BID) | ORAL | 0 refills | Status: DC

## 2024-02-12 MED ORDER — LOSARTAN POTASSIUM-HCTZ 100-12.5 MG PO TABS: 1.0000 | ORAL_TABLET | Freq: Every day | ORAL | 0 refills | Status: DC

## 2024-02-12 NOTE — Progress Notes (Signed)
 Office: [phone]  /  Fax: [phone]  WEIGHT SUMMARY AND BIOMETRICS  Anthropometric Measurements Height: 5' 6 (1.676 m) Weight: 220 lb (99.8 kg) BMI (Calculated): 35.53 Weight at Last Visit: 220 lb Weight Lost Since Last Visit: 0 Weight Gained Since Last Visit: 0 Starting Weight: 281 lb Total Weight Loss (lbs): 61 lb (27.7 kg) Peak Weight: 285 lb   Body Composition  Body Fat %: 46.8 % Fat Mass (lbs): 103.2 lbs Muscle Mass (lbs): 111.4 lbs Total Body Water (lbs): 80.2 lbs Visceral Fat Rating : 14   No data recorded  Chief Complaint: OBESITY   History of Present Illness Cheryl [name] is a 68 year old female who presents for obesity treatment.  She is following a portion control smart choices eating plan about 25% of the time and has maintained her weight over the last five weeks. She is working on increasing her hydration due to a history of elevated BUN levels, which will be rechecked today.  She is recovering from a left femur repair and is using a walker. She is participating in physiotherapy and attempting pool exercises. She experiences a fair amount of pain, which she believes may be affecting her blood pressure.  She is taking Hyzaar and requests a refill. She has a history of prediabetes and is working on decreasing simple carbohydrates and increasing protein intake. She is also on metformin  and requests a refill.  She has a history of emotional eating behaviors, especially during periods of downtime and boredom, and is developing strategies to reduce emotional eating.      PHYSICAL EXAM:  Blood pressure 139/62, pulse (!) 50, temperature 98 F (36.7 C), height 5' 6 (1.676 m), weight 220 lb (99.8 kg), SpO2 95%. Body mass index is 35.51 kg/m.  DIAGNOSTIC DATA REVIEWED:  BMET    Component Value Date/Time   NA 141 01/16/2024 0904   NA 140 05/08/2023 0910   K 4.4 01/16/2024 0904   CL 104 01/16/2024 0904   CO2 28 01/16/2024 0904    GLUCOSE 88 01/16/2024 0904   BUN 26 (H) 01/16/2024 0904   BUN 25 05/08/2023 0910   CREATININE 0.97 01/16/2024 0904   CREATININE 0.81 06/08/2016 1353   CALCIUM 9.9 01/16/2024 0904   GFRNONAA >60 11/26/2023 1548   GFRAA 106 06/10/2020 0953   Lab Results  Component Value Date   HGBA1C 5.3 05/08/2023   HGBA1C 5.8 (H) 02/06/2018   Lab Results  Component Value Date   INSULIN  9.3 05/08/2023   INSULIN  8.7 02/06/2018   Lab Results  Component Value Date   TSH 3.680 11/16/2022   CBC    Component Value Date/Time   WBC 4.4 12/28/2023 0948   RBC 4.48 12/28/2023 0948   HGB 12.6 12/28/2023 0948   HGB 14.2 04/13/2022 1110   HGB 14.3 04/02/2014 0937   HCT 38.8 12/28/2023 0948   HCT 43.6 04/13/2022 1110   PLT 307.0 12/28/2023 0948   PLT 237 04/13/2022 1110   MCV 86.6 12/28/2023 0948   MCV 88 04/13/2022 1110   MCH 28.0 11/26/2023 1548   MCHC 32.5 12/28/2023 0948   RDW 16.3 (H) 12/28/2023 0948   RDW 13.4 04/13/2022 1110   Iron Studies No results found for: IRON, TIBC, FERRITIN, IRONPCTSAT Lipid Panel     Component Value Date/Time   CHOL 240 (H) 05/08/2023 0910   TRIG 98 05/08/2023 0910   HDL 76 05/08/2023 0910   CHOLHDL 3.1 09/27/2017 1404   CHOLHDL 3.4 06/08/2016 1353  VLDL 19 06/08/2016 1353   LDLCALC 147 (H) 05/08/2023 0910   Hepatic Function Panel     Component Value Date/Time   PROT 7.6 12/28/2023 0948   PROT 6.9 05/08/2023 0910   ALBUMIN  4.5 12/28/2023 0948   ALBUMIN  4.2 05/08/2023 0910   AST 18 12/28/2023 0948   ALT 13 12/28/2023 0948   ALKPHOS 259 (H) 12/28/2023 0948   BILITOT 0.4 12/28/2023 0948   BILITOT 0.4 05/08/2023 0910      Component Value Date/Time   TSH 3.680 11/16/2022 0926   Nutritional Lab Results  Component Value Date   VD25OH 62.9 05/08/2023   VD25OH 50.4 11/16/2022   VD25OH 38.1 04/13/2022     Assessment and Plan Assessment & Plan Obesity She follows a portion control smart choices eating plan 25% of the time and has  maintained her weight over the last five weeks. Recovery from left femur repair with walker use may limit physical activity. She engages in physiotherapy and pool exercises. Emotional eating behaviors are present, particularly during downtime. - Continue portion control smart choices eating plan - Increase exercise per physical therapy recommendations  Prediabetes She is reducing simple carbohydrates and increasing protein intake. Currently on metformin  and requests a refill. Diet, exercise, and weight loss are integral to her management plan. - Refill metformin  - Continue diet, exercise, and weight loss plan  Hypertension Blood pressure is controlled at 139/62 mmHg. She is on Hyzaar and requests a refill. Pain from femur repair may influence blood pressure. - Refill Hyzaar  Dehydration She has a history of elevated BUN levels and is increasing hydration. BUN levels will be rechecked today. - Recheck BUN level  Emotional Eating Behaviors She notes some increased boredom snacking recently while recovering from surgery. No SE notes with her Effexor  or Wellbutrin  -EEB strategies discussed -RF Effexor  and Wellbutrin  and will continue to follow.  Follow-up She will continue with current treatment plans and strategies. - Follow up in four weeks    She was informed of the importance of frequent follow up visits to maximize her success with intensive lifestyle modifications for her multiple health conditions.    Louann Penton, MD

## 2024-02-13 LAB — CMP14+EGFR
ALT: 15 IU/L (ref 0–32)
AST: 17 IU/L (ref 0–40)
Albumin: 4.2 g/dL (ref 3.9–4.9)
Alkaline Phosphatase: 229 IU/L — ABNORMAL HIGH (ref 44–121)
BUN/Creatinine Ratio: 25 (ref 12–28)
BUN: 23 mg/dL (ref 8–27)
Bilirubin Total: 0.3 mg/dL (ref 0.0–1.2)
CO2: 21 mmol/L (ref 20–29)
Calcium: 9.5 mg/dL (ref 8.7–10.3)
Chloride: 103 mmol/L (ref 96–106)
Creatinine, Ser: 0.92 mg/dL (ref 0.57–1.00)
Globulin, Total: 2.5 g/dL (ref 1.5–4.5)
Glucose: 82 mg/dL (ref 70–99)
Potassium: 4.5 mmol/L (ref 3.5–5.2)
Sodium: 140 mmol/L (ref 134–144)
Total Protein: 6.7 g/dL (ref 6.0–8.5)
eGFR: 68 mL/min/{1.73_m2} (ref >59)

## 2024-02-29 ENCOUNTER — Ambulatory Visit: Payer: Self-pay | Admitting: Family Medicine

## 2024-02-29 ENCOUNTER — Encounter: Payer: Self-pay | Admitting: Family Medicine

## 2024-02-29 ENCOUNTER — Ambulatory Visit: Admitting: Family Medicine

## 2024-02-29 VITALS — BP 130/86 | HR 76 | Temp 97.8°F | Ht 65.8 in | Wt 227.0 lb

## 2024-02-29 DIAGNOSIS — R7303 Prediabetes: Secondary | ICD-10-CM | POA: Diagnosis not present

## 2024-02-29 DIAGNOSIS — E559 Vitamin D deficiency, unspecified: Secondary | ICD-10-CM

## 2024-02-29 DIAGNOSIS — E538 Deficiency of other specified B group vitamins: Secondary | ICD-10-CM | POA: Diagnosis not present

## 2024-02-29 DIAGNOSIS — E7800 Pure hypercholesterolemia, unspecified: Secondary | ICD-10-CM | POA: Diagnosis not present

## 2024-02-29 DIAGNOSIS — Z0000 Encounter for general adult medical examination without abnormal findings: Secondary | ICD-10-CM

## 2024-02-29 DIAGNOSIS — Z1159 Encounter for screening for other viral diseases: Secondary | ICD-10-CM | POA: Diagnosis not present

## 2024-02-29 DIAGNOSIS — R7989 Other specified abnormal findings of blood chemistry: Secondary | ICD-10-CM

## 2024-02-29 DIAGNOSIS — I10 Essential (primary) hypertension: Secondary | ICD-10-CM | POA: Diagnosis not present

## 2024-02-29 LAB — VITAMIN B12: Vitamin B-12: 255 pg/mL (ref 211–911)

## 2024-02-29 LAB — MICROALBUMIN / CREATININE URINE RATIO
Creatinine,U: 104.5 mg/dL
Microalb Creat Ratio: UNDETERMINED mg/g (ref 0.0–30.0)
Microalb, Ur: <0.7 mg/dL

## 2024-02-29 LAB — TSH: TSH: 2.51 u[IU]/mL (ref 0.35–5.50)

## 2024-02-29 LAB — LIPID PANEL
Cholesterol: 212 mg/dL — ABNORMAL HIGH (ref 0–200)
HDL: 77.6 mg/dL (ref >39.00)
LDL Cholesterol: 124 mg/dL — ABNORMAL HIGH (ref 0–99)
NonHDL: 134.4
Total CHOL/HDL Ratio: 3
Triglycerides: 50 mg/dL (ref 0.0–149.0)
VLDL: 10 mg/dL (ref 0.0–40.0)

## 2024-02-29 LAB — VITAMIN D 25 HYDROXY (VIT D DEFICIENCY, FRACTURES): VITD: 41.08 ng/mL (ref 30.00–100.00)

## 2024-02-29 LAB — HEMOGLOBIN A1C: Hgb A1c MFr Bld: 5.7 % (ref 4.6–6.5)

## 2024-02-29 NOTE — Patient Instructions (Addendum)
-  It was great to see you today.  -Physical exam completed today. No concerns. -Continue with a healthy diet and participating in regular exercise.  -Discussed that elevated alkaline phosphatase is from having recent knee surgery.  -Ordered labs and urine sample. Office will call with results and will be available on MyChart.  -Follow up in when you need your surgical clearance for the right knee, otherwise yearly physicals.  -Continue to have a great summer. You are making progress!

## 2024-02-29 NOTE — Progress Notes (Signed)
 Complete physical exam  Patient: Cheryl [name]   DOB: [birth year]   68 y.o. Female  MRN: [id number]  Subjective:    Chief Complaint  Patient presents with   Annual Exam    Cheryl [name] is a 68 y.o. female who presents today for a complete physical exam. She reports consuming a high protein, low carbohydrate diet. Water exercise, 30 minutes twice a week. She generally feels well. She reports sleeping poorly. She does not have additional problems to discuss today.    Most recent fall risk assessment:    02/29/2024    8:10 AM  Fall Risk   Falls in the past year? 1  Number falls in past yr: 1  Injury with Fall? 1  Risk for fall due to : History of fall(s)  Follow up Falls evaluation completed     Most recent depression screenings:    02/29/2024    8:11 AM 12/28/2023    9:05 AM  PHQ 2/9 Scores  PHQ - 2 Score 0 0  PHQ- 9 Score 8 0    Vision:Within last year and Dental: No current dental problems and Receives regular dental care  Past Medical History:  Diagnosis Date   Allergy    Arthritis    Chest pain    Depression    Fatigue    GERD (gastroesophageal reflux disease)    Hyperlipidemia    Hypertension    Joint pain    Obesity    Osteopenia    Palpitation    Sleep apnea    uses CPAP    SOB (shortness of breath)    Urinary incontinence    Past Surgical History:  Procedure Laterality Date   ABDOMINAL HYSTERECTOMY  08/10/2000   TAH/RSO   BREAST SURGERY     Biopsy-Benign   COLOSTOMY  2000   Dr.Patterson   KNEE ARTHROSCOPY Bilateral    LAPAROSCOPIC CHOLECYSTECTOMY  2010   LAPAROTOMY  1990   Fibroid removed-ovarian cyst    ORIF FEMUR FRACTURE Left 11/23/2023   Procedure: OPEN REDUCTION INTERNAL FIXATION (ORIF) DISTAL FEMUR FRACTURE;  Surgeon: Beverley Evalene BIRCH, MD;  Location: [facility] OR;  Service: Orthopedics;  Laterality: Left;   RETINAL DETACHMENT SURGERY Right    It was torn   RHINOPLASTY     Social History   Tobacco Use   Smoking status:  Former    Current packs/day: 0.00    Average packs/day: 0.5 packs/day for 10.0 years (5.0 ttl pk-yrs)    Types: Cigarettes    Quit date: 08/15/2007    Years since quitting: 16.5   Smokeless tobacco: Never  Vaping Use   Vaping status: Never Used  Substance Use Topics   Alcohol use: Yes    Alcohol/week: 5.0 standard drinks of alcohol    Types: 2 Glasses of wine, 3 Standard drinks or equivalent per week    Comment: 3 drinks per week per pt    Drug use: No   Social History   Socioeconomic History   Marital status: Married    Spouse name: Tazaria Dlugosz   Number of children: 2   Years of education: 16   Highest education level: Bachelor's degree (e.g., BA, AB, BS)  Occupational History   Occupation: Lawyer  Tobacco Use   Smoking status: Former    Current packs/day: 0.00    Average packs/day: 0.5 packs/day for 10.0 years (5.0 ttl pk-yrs)    Types: Cigarettes    Quit date: 08/15/2007    Years  since quitting: 16.5   Smokeless tobacco: Never  Vaping Use   Vaping status: Never Used  Substance and Sexual Activity   Alcohol use: Yes    Alcohol/week: 5.0 standard drinks of alcohol    Types: 2 Glasses of wine, 3 Standard drinks or equivalent per week    Comment: 3 drinks per week per pt    Drug use: No   Sexual activity: Not Currently    Birth control/protection: Post-menopausal, Surgical    Comment: hysterectomy  Other Topics Concern   Not on file  Social History Narrative   2 adopted children, Eric (17) and Buyer, retail (18)   Social Drivers of Corporate investment banker Strain: Low Risk  (06/26/2023)   Overall Financial Resource Strain (CARDIA)    Difficulty of Paying Living Expenses: Not hard at all  Food Insecurity: No Food Insecurity (11/23/2023)   Hunger Vital Sign    Worried About Running Out of Food in the Last Year: Never true    Ran Out of Food in the Last Year: Never true  Transportation Needs: No Transportation Needs (11/23/2023)   PRAPARE -  Administrator, Civil Service (Medical): No    Lack of Transportation (Non-Medical): No  Physical Activity: Insufficiently Active (06/26/2023)   Exercise Vital Sign    Days of Exercise per Week: 2 days    Minutes of Exercise per Session: 20 min  Stress: No Stress Concern Present (06/26/2023)   [employer] of Occupational Health - Occupational Stress Questionnaire    Feeling of Stress : Not at all  Recent Concern: Stress - Stress Concern Present (05/20/2023)   [employer] of Occupational Health - Occupational Stress Questionnaire    Feeling of Stress : To some extent  Social Connections: Socially Integrated (11/23/2023)   Social Connection and Isolation Panel    Frequency of Communication with Friends and Family: More than three times a week    Frequency of Social Gatherings with Friends and Family: Three times a week    Attends Religious Services: More than 4 times per year    Active Member of Clubs or Organizations: Yes    Attends Banker Meetings: More than 4 times per year    Marital Status: Married  Catering manager Violence: Not At Risk (11/23/2023)   Humiliation, Afraid, Rape, and Kick questionnaire    Fear of Current or Ex-Partner: No    Emotionally Abused: No    Physically Abused: No    Sexually Abused: No   Family Status  Relation Name Status   Mother Washington Dorsett Start Alive   Father Deward BRAVO. Julieanne Alive   Sister Slater Alive   Pat Aunt Harvest [place] (Not Specified)   Pat Uncle Norleen Julieanne (Not Specified)   MGM Berneice Levell Brewer Deceased   MGF  Deceased   PGM  Deceased   PGF  Deceased   Neg Hx  (Not Specified)  No partnership data on file   Family History  Problem Relation Age of Onset   Breast cancer Mother        Age 20   Hypertension Mother    Depression Mother    Arthritis Mother    Cancer Mother    COPD Mother    Hypertension Father    Arthritis Father    Heart disease Father    Cancer Father         Spleen   Sleep apnea Father    Obesity Paternal Aunt    Obesity  Paternal Uncle    Hearing loss Maternal Grandmother    Heart disease Maternal Grandfather    Colon cancer Paternal Grandfather        Colon cancer   Stomach cancer Neg Hx    Colon polyps Neg Hx    Rectal cancer Neg Hx    Esophageal cancer Neg Hx    Allergies  Allergen Reactions   Ace Inhibitors Cough   Lisinopril Cough   Patient Care Team: Billy Philippe SAUNDERS, NP as PCP - General (Family Medicine) Cleotilde Ronal RAMAN, MD as Consulting Physician (Gynecology) Specialists, [facility] (Orthopedic Surgery) Cleotilde Elspeth CROME, OD (Optometry) Nonah Camellia ORN, MD as Consulting Physician (Optometry) Armbruster, Elspeth SQUIBB, MD as Consulting Physician (Gastroenterology) Verdon Louann BIRCH, MD as Consulting Physician (Family Medicine) Caleen Dirks, MD as Consulting Physician (Internal Medicine) Caleen Dirks, MD as Consulting Physician (Internal Medicine)   Outpatient Medications Prior to Visit  Medication Sig   acetaminophen  (TYLENOL ) 500 MG tablet Take 2 tablets (1,000 mg total) by mouth every 6 (six) hours as needed for mild pain (pain score 1-3) or moderate pain (pain score 4-6).   aspirin  EC 81 MG tablet Take 1 tablet (81 mg total) by mouth 2 (two) times daily. To prevent blood clots for 30 days after surgery.   buPROPion  (WELLBUTRIN  SR) 200 MG 12 hr tablet Take 1 tablet (200 mg total) by mouth daily.   docusate sodium  (COLACE) 100 MG capsule Take 100 mg by mouth in the morning.   Loratadine (CLARITIN PO) Take 1 tablet by mouth in the morning.   losartan -hydrochlorothiazide  (HYZAAR) 100-12.5 MG tablet Take 1 tablet by mouth daily.   metFORMIN  (GLUCOPHAGE ) 500 MG tablet Take 1 tablet (500 mg total) by mouth 2 (two) times daily with a meal.   methocarbamol  (ROBAXIN -750) 750 MG tablet Take 1 tablet (750 mg total) by mouth every 8 (eight) hours as needed for muscle spasms.   ondansetron  (ZOFRAN -ODT) 4 MG disintegrating tablet  Take 1 tablet (4 mg total) by mouth every 8 (eight) hours as needed for nausea or vomiting.   UNABLE TO FIND C PAP for sleep apnea    venlafaxine  (EFFEXOR ) 75 MG tablet Take 1 tablet (75 mg total) by mouth daily.   [DISCONTINUED] meloxicam  (MOBIC ) 15 MG tablet Take 1 tablet (15 mg total) by mouth daily as needed for pain (and inflammation).   No facility-administered medications prior to visit.    Review of Systems  Constitutional: Negative.   HENT: Negative.    Eyes: Negative.   Cardiovascular:  Positive for leg swelling (Had previous surgery).  Gastrointestinal: Negative.   Genitourinary:        Nocturia   Musculoskeletal:  Positive for falls (1 within the last 6 months), joint pain and myalgias.  Skin: Negative.   Neurological:        Numbness-intermittent tingling in left little toe  Endo/Heme/Allergies:  Positive for environmental allergies.  Psychiatric/Behavioral:  Positive for depression.    See HPI above    Objective:   BP 130/86   Pulse 76   Temp 97.8 F (36.6 C) (Oral)   Ht 5' 5.8 (1.671 m)   Wt 227 lb (103 kg)   SpO2 96%   BMI 36.86 kg/m  BP Readings from Last 3 Encounters:  02/29/24 130/86  02/12/24 139/62  01/02/24 123/77     Physical Exam Vitals reviewed.  Constitutional:      General: She is not in acute distress.    Appearance: Normal appearance. She is  obese. She is not ill-appearing, toxic-appearing or diaphoretic.  HENT:     Head: Normocephalic and atraumatic.     Right Ear: Tympanic membrane, ear canal and external ear normal. There is no impacted cerumen.     Left Ear: Tympanic membrane, ear canal and external ear normal. There is no impacted cerumen.     Mouth/Throat:     Mouth: Mucous membranes are moist.     Pharynx: Oropharynx is clear. Uvula midline. No pharyngeal swelling, oropharyngeal exudate, posterior oropharyngeal erythema, uvula swelling or postnasal drip.  Eyes:     General:        Right eye: No discharge.        Left eye: No  discharge.     Conjunctiva/sclera: Conjunctivae normal.     Pupils: Pupils are equal, round, and reactive to light.  Neck:     Thyroid : No thyromegaly.  Cardiovascular:     Rate and Rhythm: Normal rate and regular rhythm.     Heart sounds: Normal heart sounds. No murmur heard.    No friction rub. No gallop.  Pulmonary:     Effort: Pulmonary effort is normal. No respiratory distress.     Breath sounds: Normal breath sounds.  Abdominal:     General: Abdomen is flat. Bowel sounds are normal.     Palpations: Abdomen is soft.  Musculoskeletal:        General: Normal range of motion.     Cervical back: Normal range of motion.     Right lower leg: Edema (Trace at most) present.     Left lower leg: Edema (Trace at most) present.  Lymphadenopathy:     Cervical: No cervical adenopathy.  Skin:    General: Skin is warm and dry.  Neurological:     General: No focal deficit present.     Mental Status: She is alert. Mental status is at baseline.     Gait: Gait abnormal Joelyn).  Psychiatric:        Mood and Affect: Mood normal.        Behavior: Behavior normal.        Thought Content: Thought content normal.        Judgment: Judgment normal.      No results found for any visits on 02/29/24. Last CBC Lab Results  Component Value Date   WBC 4.4 12/28/2023   HGB 12.6 12/28/2023   HCT 38.8 12/28/2023   MCV 86.6 12/28/2023   MCH 28.0 11/26/2023   RDW 16.3 (H) 12/28/2023   PLT 307.0 12/28/2023   Last metabolic panel Lab Results  Component Value Date   GLUCOSE 82 02/12/2024   NA 140 02/12/2024   K 4.5 02/12/2024   CL 103 02/12/2024   CO2 21 02/12/2024   BUN 23 02/12/2024   CREATININE 0.92 02/12/2024   EGFR 68 02/12/2024   CALCIUM 9.5 02/12/2024   PROT 6.7 02/12/2024   ALBUMIN  4.2 02/12/2024   LABGLOB 2.5 02/12/2024   AGRATIO 1.8 11/16/2022   BILITOT 0.3 02/12/2024   ALKPHOS 229 (H) 02/12/2024   AST 17 02/12/2024   ALT 15 02/12/2024   ANIONGAP 12 11/26/2023         Assessment & Plan:    Routine Health Maintenance and Physical Exam  Immunization History  Administered Date(s) Administered   Fluad Trivalent(High Dose 65+) 05/25/2023   Influenza Split 06/05/2012   Influenza,inj,Quad PF,6+ Mos 04/30/2013, 05/26/2018, 05/10/2019   Influenza-Unspecified 06/16/2014, 06/18/2017, 05/23/2018, 05/15/2019   Td 08/14/1998   Tdap 08/14/2010  Health Maintenance  Topic Date Due   COVID-19 Vaccine (1) Never done   Diabetic kidney evaluation - Urine ACR  Never done   Pneumococcal Vaccine: 50+ Years (1 of 2 - PCV) Never done   Zoster Vaccines- Shingrix (1 of 2) Never done   DTaP/Tdap/Td (3 - Td or Tdap) 08/14/2020   Cervical Cancer Screening (Pap smear)  05/24/2024 (Originally 01/12/2012)   Hepatitis C Screening  06/24/2024 (Originally 03/21/1974)   INFLUENZA VACCINE  03/14/2024   Medicare Annual Wellness (AWV)  06/25/2024   Diabetic kidney evaluation - eGFR measurement  02/11/2025   MAMMOGRAM  10/23/2025   Colonoscopy  07/23/2028   DEXA SCAN  Completed   Hepatitis B Vaccines  Aged Out   HPV VACCINES  Aged Out   Meningococcal B Vaccine  Aged Out    Discussed health benefits of physical activity, and encouraged her to engage in regular exercise appropriate for her age and condition.  Annual physical exam  Prediabetes -     Microalbumin / creatinine urine ratio -     Hemoglobin A1c  Primary hypertension -     Microalbumin / creatinine urine ratio  Vitamin D  deficiency -     VITAMIN D  25 Hydroxy (Vit-D Deficiency, Fractures)  TSH elevation -     TSH  Pure hypercholesterolemia -     Lipid panel  Vitamin B12 deficiency -     Vitamin B12  Need for hepatitis C screening test -     Hepatitis C antibody  1.Review health maintenance:  -Covid booster: Declines  -Microalbumin/creatinine urine: Ordered -Zoster vaccine: Needs 2nd vaccine  -PNA vaccine: [facility] on [address]  -Tdap vaccine: [facility] on [address]  -Cervical cancer  screening: Will make appt for later -Hep C screening: Ordered  2. Physical exam completed today. No concerns. 3.Continue with a healthy diet and participating in regular exercise.  4.Discussed that elevated alkaline phosphatase is from having recent knee surgery.  5. Ordered labs (A1c, Vitamin B12/D, TSH, and Lipid panel-fasting and microalbumin/creatinine urine sample for history of diabetes, HTN, vitamin deficiencies, and hypercholesterolemia.  Office will call with results and will be available on MyChart.  6. Follow up in when surgical clearance for the right knee is needed, otherwise yearly physicals.     Precious Segall, NP

## 2024-03-01 LAB — HEPATITIS C ANTIBODY: Hepatitis C Ab: NONREACTIVE

## 2024-03-03 ENCOUNTER — Telehealth: Admitting: Family Medicine

## 2024-03-03 ENCOUNTER — Encounter: Payer: Self-pay | Admitting: Family Medicine

## 2024-03-03 VITALS — Ht 65.8 in | Wt 227.0 lb

## 2024-03-03 DIAGNOSIS — U071 COVID-19: Secondary | ICD-10-CM | POA: Diagnosis not present

## 2024-03-03 MED ORDER — NIRMATRELVIR/RITONAVIR (PAXLOVID)TABLET: 3.0000 | ORAL_TABLET | Freq: Two times a day (BID) | ORAL | 0 refills | Status: AC

## 2024-03-03 NOTE — Progress Notes (Addendum)
 Virtual Visit via Video Note  I connected with Cheryl [name] on 03/03/24 at 10:28 AM by a video enabled telemedicine application and verified that I am speaking with the correct person using two identifiers.   Patient Location: Home Provider Location: office - [place].    I discussed the limitations, risks, security and privacy concerns of performing an evaluation and management service by telephone and the availability of in person appointments. I also discussed with the patient that there may be a patient responsible charge related to this service. The patient expressed understanding and agreed to proceed, consent obtained  Chief Complaint  Patient presents with   Covid Positive    History of Present Illness: Cheryl [name] is a 68 y.o. female that had a positive covid test. Tested yesterday positive, symptoms started Saturday.   Patient has the following symptoms:  Cough-productive Nasal congestion Headache Sore throat SHOB- on exertion Ear fullness  Loss of appetite  Body aches  Loss of taste  Fever-hot/cold, unmeasured   Denies chest pain.  Has took Tylenol  PM.   Patient Active Problem List   Diagnosis Date Noted   Dehydration 02/12/2024   Closed fracture of right femur, unspecified fracture morphology, unspecified portion of femur, sequela 11/22/2023   Left knee pain 07/31/2023   Other insomnia 04/04/2023   BMI 35.0-35.9,adult 10/18/2022   Obesity, Beginning BMI 45.35 10/18/2022   BMI 37.0-37.9, adult 09/20/2022   Obesity, Beginning BMI 45.35 09/20/2022   Vitamin D  deficiency 04/13/2022   Pure hypercholesterolemia 04/13/2022   Class 3 severe obesity with serious comorbidity and body mass index (BMI) of 45.0 to 49.9 in adult 04/13/2022   Osteoarthritis of right knee 12/27/2021   Depression 04/10/2019   Insulin  resistance 04/09/2019   TSH elevation 04/09/2019   Prediabetes 05/30/2018   Other fatigue 02/06/2018   Shortness of breath on  exertion 02/06/2018   Essential hypertension 02/06/2018   Sleep apnea    Urinary incontinence    HTN (hypertension) 01/05/2012   Osteopenia    Chest pain, central 01/06/2011   Obesity    Past Medical History:  Diagnosis Date   Allergy    Arthritis    Chest pain    Depression    Fatigue    GERD (gastroesophageal reflux disease)    Hyperlipidemia    Hypertension    Joint pain    Obesity    Osteopenia    Palpitation    Sleep apnea    uses CPAP    SOB (shortness of breath)    Urinary incontinence    Past Surgical History:  Procedure Laterality Date   ABDOMINAL HYSTERECTOMY  08/10/2000   TAH/RSO   BREAST SURGERY     Biopsy-Benign   COLOSTOMY  2000   Dr.Patterson   KNEE ARTHROSCOPY Bilateral    LAPAROSCOPIC CHOLECYSTECTOMY  2010   LAPAROTOMY  1990   Fibroid removed-ovarian cyst    ORIF FEMUR FRACTURE Left 11/23/2023   Procedure: OPEN REDUCTION INTERNAL FIXATION (ORIF) DISTAL FEMUR FRACTURE;  Surgeon: Beverley Evalene BIRCH, MD;  Location: [facility] OR;  Service: Orthopedics;  Laterality: Left;   RETINAL DETACHMENT SURGERY Right    It was torn   RHINOPLASTY     Allergies  Allergen Reactions   Ace Inhibitors Cough   Lisinopril Cough   Prior to Admission medications   Medication Sig Start Date End Date Taking? Authorizing Provider  acetaminophen  (TYLENOL ) 500 MG tablet Take 2 tablets (1,000 mg total) by mouth every 6 (six) hours as needed for  mild pain (pain score 1-3) or moderate pain (pain score 4-6). 11/27/23  Yes Ted Gerard HERO, PA-C  aspirin  EC 81 MG tablet Take 1 tablet (81 mg total) by mouth 2 (two) times daily. To prevent blood clots for 30 days after surgery. 11/27/23  Yes Gawne, Meghan M, PA-C  buPROPion  (WELLBUTRIN  SR) 200 MG 12 hr tablet Take 1 tablet (200 mg total) by mouth daily. 02/12/24  Yes Beasley, Caren D, MD  docusate sodium  (COLACE) 100 MG capsule Take 100 mg by mouth in the morning.   Yes [provider]  Loratadine (CLARITIN PO) Take 1 tablet by mouth  in the morning.   Yes [provider]  losartan -hydrochlorothiazide  (HYZAAR) 100-12.5 MG tablet Take 1 tablet by mouth daily. 02/12/24  Yes Verdon Parry D, MD  metFORMIN  (GLUCOPHAGE ) 500 MG tablet Take 1 tablet (500 mg total) by mouth 2 (two) times daily with a meal. 02/12/24  Yes Verdon, Caren D, MD  methocarbamol  (ROBAXIN -750) 750 MG tablet Take 1 tablet (750 mg total) by mouth every 8 (eight) hours as needed for muscle spasms. 11/27/23  Yes Gawne, Meghan M, PA-C  ondansetron  (ZOFRAN -ODT) 4 MG disintegrating tablet Take 1 tablet (4 mg total) by mouth every 8 (eight) hours as needed for nausea or vomiting. 11/27/23  Yes Gawne, Meghan M, PA-C  UNABLE TO FIND C PAP for sleep apnea    Yes [provider]  venlafaxine  (EFFEXOR ) 75 MG tablet Take 1 tablet (75 mg total) by mouth daily. 02/12/24  Yes Verdon Parry BIRCH, MD   Social History   Socioeconomic History   Marital status: Married    Spouse name: Cheryl [name]   Number of children: 2   Years of education: 16   Highest education level: Bachelor's degree (e.g., BA, AB, BS)  Occupational History   Occupation: Lawyer  Tobacco Use   Smoking status: Former    Current packs/day: 0.00    Average packs/day: 0.5 packs/day for 10.0 years (5.0 ttl pk-yrs)    Types: Cigarettes    Quit date: 08/15/2007    Years since quitting: 16.5   Smokeless tobacco: Never  Vaping Use   Vaping status: Never Used  Substance and Sexual Activity   Alcohol use: Yes    Alcohol/week: 5.0 standard drinks of alcohol    Types: 2 Glasses of wine, 3 Standard drinks or equivalent per week    Comment: 3 drinks per week per pt    Drug use: No   Sexual activity: Not Currently    Birth control/protection: Post-menopausal, Surgical    Comment: hysterectomy  Other Topics Concern   Not on file  Social History Narrative   2 adopted children, Eric (17) and Buyer, retail (18)   Social Drivers of Corporate investment banker Strain: Low Risk   (06/26/2023)   Overall Financial Resource Strain (CARDIA)    Difficulty of Paying Living Expenses: Not hard at all  Food Insecurity: No Food Insecurity (11/23/2023)   Hunger Vital Sign    Worried About Running Out of Food in the Last Year: Never true    Ran Out of Food in the Last Year: Never true  Transportation Needs: No Transportation Needs (11/23/2023)   PRAPARE - Administrator, Civil Service (Medical): No    Lack of Transportation (Non-Medical): No  Physical Activity: Insufficiently Active (06/26/2023)   Exercise Vital Sign    Days of Exercise per Week: 2 days    Minutes of Exercise per Session: 20 min  Stress: No Stress Concern Present (06/26/2023)   [employer] of Occupational Health - Occupational Stress Questionnaire    Feeling of Stress : Not at all  Recent Concern: Stress - Stress Concern Present (05/20/2023)   [employer] of Occupational Health - Occupational Stress Questionnaire    Feeling of Stress : To some extent  Social Connections: Socially Integrated (11/23/2023)   Social Connection and Isolation Panel    Frequency of Communication with Friends and Family: More than three times a week    Frequency of Social Gatherings with Friends and Family: Three times a week    Attends Religious Services: More than 4 times per year    Active Member of Clubs or Organizations: Yes    Attends Banker Meetings: More than 4 times per year    Marital Status: Married  Catering manager Violence: Not At Risk (11/23/2023)   Humiliation, Afraid, Rape, and Kick questionnaire    Fear of Current or Ex-Partner: No    Emotionally Abused: No    Physically Abused: No    Sexually Abused: No    Observations/Objective: Today's Vitals   03/03/24 0956  Weight: 227 lb (103 kg)  Height: 5' 5.8 (1.671 m)  Unable to obtain a set of vital signs since equipment is not accessible to patient.  Physical Exam Vitals reviewed.  Constitutional:      General: She is  not in acute distress.    Appearance: Normal appearance. She is ill-appearing (Mild). She is not toxic-appearing or diaphoretic.  HENT:     Head: Normocephalic and atraumatic.  Eyes:     General:        Right eye: No discharge.        Left eye: No discharge.     Conjunctiva/sclera: Conjunctivae normal.  Pulmonary:     Effort: Pulmonary effort is normal. No respiratory distress.  Skin:    General: Skin is dry.  Neurological:     General: No focal deficit present.     Mental Status: She is alert and oriented to person, place, and time. Mental status is at baseline.  Psychiatric:        Mood and Affect: Mood normal.        Behavior: Behavior normal.        Thought Content: Thought content normal.        Judgment: Judgment normal.     Assessment and Plan: COVID-19 -     nirmatrelvir /ritonavir ; Take 3 tablets by mouth 2 (two) times daily for 5 days. (Take nirmatrelvir  150 mg two tablets twice daily for 5 days and ritonavir  100 mg one tablet twice daily for 5 days) Patient GFR is 68.  Dispense: 30 tablet; Refill: 0  -Prescribed Paxlovid  for covid positive. -Rest, hydrate - Alternate Tylenol  1000mg  and Ibuprofen 600-800mg  every 4 hours for pain, headache, and body aches. Recommend to eat something when taking Ibuprofen.   -May take Mucinex over the counter.  -If shortness of breath becomes worse or develops chest pain, recommend to go to the closes emergency department. -Discussed about covid quarantine guidelines.   Follow Up Instructions: Follow up if not improved.    I discussed the assessment and treatment plan with the patient. The patient was provided an opportunity to ask questions and all were answered. The patient agreed with the plan and demonstrated an understanding of the instructions.   The patient was advised to call back or seek an in-person evaluation if the symptoms worsen or if the condition fails  to improve as anticipated.  Teren Franckowiak, NP

## 2024-03-03 NOTE — Patient Instructions (Addendum)
-  I hope you get to feeling better soon!  -Prescribed Paxlovid  for covid positive. -Rest, hydrate - Alternate Tylenol  1000mg  and Ibuprofen 600-800mg  every 4 hours for pain, headache, and body aches. Recommend to eat something when taking Ibuprofen.   -May take Mucinex over the counter.  -If shortness of breath becomes worse or develops chest pain, recommend to go to the closes emergency department. -Discussed about covid quarantine guidelines.

## 2024-03-11 ENCOUNTER — Ambulatory Visit (INDEPENDENT_AMBULATORY_CARE_PROVIDER_SITE_OTHER): Admitting: Family Medicine

## 2024-03-12 DIAGNOSIS — M25562 Pain in left knee: Secondary | ICD-10-CM | POA: Diagnosis not present

## 2024-03-12 DIAGNOSIS — S72452D Displaced supracondylar fracture without intracondylar extension of lower end of left femur, subsequent encounter for closed fracture with routine healing: Secondary | ICD-10-CM | POA: Diagnosis not present

## 2024-03-13 DIAGNOSIS — N182 Chronic kidney disease, stage 2 (mild): Secondary | ICD-10-CM | POA: Diagnosis not present

## 2024-03-13 DIAGNOSIS — N179 Acute kidney failure, unspecified: Secondary | ICD-10-CM | POA: Diagnosis not present

## 2024-03-13 DIAGNOSIS — N2581 Secondary hyperparathyroidism of renal origin: Secondary | ICD-10-CM | POA: Diagnosis not present

## 2024-03-13 DIAGNOSIS — E559 Vitamin D deficiency, unspecified: Secondary | ICD-10-CM | POA: Diagnosis not present

## 2024-03-13 DIAGNOSIS — E669 Obesity, unspecified: Secondary | ICD-10-CM | POA: Diagnosis not present

## 2024-03-13 DIAGNOSIS — I129 Hypertensive chronic kidney disease with stage 1 through stage 4 chronic kidney disease, or unspecified chronic kidney disease: Secondary | ICD-10-CM | POA: Diagnosis not present

## 2024-03-13 DIAGNOSIS — R7303 Prediabetes: Secondary | ICD-10-CM | POA: Diagnosis not present

## 2024-03-19 LAB — LAB REPORT - SCANNED
Creatinine, POC: 113.5 mg/dL
EGFR: 68

## 2024-03-20 ENCOUNTER — Ambulatory Visit (INDEPENDENT_AMBULATORY_CARE_PROVIDER_SITE_OTHER): Admitting: Family Medicine

## 2024-03-25 ENCOUNTER — Other Ambulatory Visit (INDEPENDENT_AMBULATORY_CARE_PROVIDER_SITE_OTHER): Payer: Self-pay | Admitting: Family Medicine

## 2024-03-25 DIAGNOSIS — I10 Essential (primary) hypertension: Secondary | ICD-10-CM

## 2024-03-25 DIAGNOSIS — F3289 Other specified depressive episodes: Secondary | ICD-10-CM

## 2024-03-26 NOTE — Telephone Encounter (Signed)
 LAST APPOINTMENT DATE: 02/12/2024 NEXT APPOINTMENT DATE: 04/21/2024   [id number] [place], [place] - [address] AT [facility] [name] [place] [address] Phone: [phone] Fax: [phone]  [facility] - [place], [place] - [address] [place] [address] Phone: [phone] Fax: [phone]  Patient is requesting a refill of the following medications: Requested Prescriptions   Pending Prescriptions Disp Refills   buPROPion  (WELLBUTRIN  SR) 200 MG 12 hr tablet [Pharmacy Med Name: BUPROPION  HYDROCHLORIDE ER (SR) 200 MG Oral Tablet Extended Release 12 Hour] 90 tablet 3    Sig: TAKE 1 TABLET EVERY DAY   venlafaxine  (EFFEXOR ) 75 MG tablet [Pharmacy Med Name: VENLAFAXINE  HCL 75 MG Oral Tablet] 90 tablet 3    Sig: TAKE 1 TABLET EVERY DAY   losartan -hydrochlorothiazide  (HYZAAR) 100-12.5 MG tablet [Pharmacy Med Name: LOSARTAN  POTASSIUM/HYDROCHLOROTHIAZIDE  100-12.5 MG Oral Tablet] 90 tablet 3    Sig: TAKE 1 TABLET EVERY DAY    Date last filled: 02/12/2024 Previously prescribed by Dr Verdon  Lab Results  Component Value Date   HGBA1C 5.7 02/29/2024   HGBA1C 5.3 05/08/2023   HGBA1C 5.7 (H) 11/16/2022   Lab Results  Component Value Date   MICROALBUR <0.7 02/29/2024   LDLCALC 124 (H) 02/29/2024   CREATININE 0.92 02/12/2024   Lab Results  Component Value Date   VD25OH 41.08 02/29/2024   VD25OH 62.9 05/08/2023   VD25OH 50.4 11/16/2022    BP Readings from Last 3 Encounters:  02/29/24 130/86  02/12/24 139/62  01/02/24 123/77

## 2024-04-16 ENCOUNTER — Other Ambulatory Visit (INDEPENDENT_AMBULATORY_CARE_PROVIDER_SITE_OTHER): Payer: Self-pay | Admitting: Family Medicine

## 2024-04-16 DIAGNOSIS — R7303 Prediabetes: Secondary | ICD-10-CM

## 2024-04-21 ENCOUNTER — Ambulatory Visit (INDEPENDENT_AMBULATORY_CARE_PROVIDER_SITE_OTHER): Admitting: Family Medicine

## 2024-04-21 ENCOUNTER — Encounter (INDEPENDENT_AMBULATORY_CARE_PROVIDER_SITE_OTHER): Payer: Self-pay | Admitting: Family Medicine

## 2024-04-21 VITALS — BP 147/79 | HR 73 | Temp 97.8°F | Ht 66.0 in | Wt 219.0 lb

## 2024-04-21 DIAGNOSIS — F3289 Other specified depressive episodes: Secondary | ICD-10-CM

## 2024-04-21 DIAGNOSIS — Z6835 Body mass index (BMI) 35.0-35.9, adult: Secondary | ICD-10-CM | POA: Diagnosis not present

## 2024-04-21 DIAGNOSIS — F32A Depression, unspecified: Secondary | ICD-10-CM

## 2024-04-21 DIAGNOSIS — R7303 Prediabetes: Secondary | ICD-10-CM

## 2024-04-21 DIAGNOSIS — Z6841 Body Mass Index (BMI) 40.0 and over, adult: Secondary | ICD-10-CM

## 2024-04-21 DIAGNOSIS — E669 Obesity, unspecified: Secondary | ICD-10-CM | POA: Diagnosis not present

## 2024-04-21 DIAGNOSIS — I10 Essential (primary) hypertension: Secondary | ICD-10-CM

## 2024-04-21 DIAGNOSIS — F5089 Other specified eating disorder: Secondary | ICD-10-CM | POA: Diagnosis not present

## 2024-04-21 MED ORDER — VENLAFAXINE HCL 75 MG PO TABS
75.0000 mg | ORAL_TABLET | Freq: Every day | ORAL | 0 refills | Status: DC
Start: 2024-04-21 — End: 2024-07-02

## 2024-04-21 MED ORDER — LOSARTAN POTASSIUM-HCTZ 100-12.5 MG PO TABS
1.0000 | ORAL_TABLET | Freq: Every day | ORAL | 0 refills | Status: DC
Start: 2024-04-21 — End: 2024-07-02

## 2024-04-21 MED ORDER — METFORMIN HCL 500 MG PO TABS
500.0000 mg | ORAL_TABLET | Freq: Two times a day (BID) | ORAL | 0 refills | Status: DC
Start: 2024-04-21 — End: 2024-07-02

## 2024-04-21 MED ORDER — BUPROPION HCL ER (SR) 200 MG PO TB12: 200.0000 mg | ORAL_TABLET | Freq: Every day | ORAL | 0 refills | Status: DC

## 2024-04-21 NOTE — Progress Notes (Signed)
 Office: [phone]  /  Fax: [phone]  WEIGHT SUMMARY AND BIOMETRICS  Anthropometric Measurements Height: 5' 6 (1.676 m) Weight: 219 lb (99.3 kg) BMI (Calculated): 35.36 Weight at Last Visit: 220 lb Weight Lost Since Last Visit: 1 lb Weight Gained Since Last Visit: 0 Starting Weight: 281 lb Total Weight Loss (lbs): 62 lb (28.1 kg) Peak Weight: 285 lb   Body Composition  Body Fat %: 44.9 % Fat Mass (lbs): 98.6 lbs Muscle Mass (lbs): 115 lbs Total Body Water (lbs): 80.4 lbs Visceral Fat Rating : 14   Other Clinical Data Fasting: ys Labs: no Starting Date: 02/06/18    Chief Complaint: OBESITY   Discussed the use of AI scribe software for clinical note transcription with the patient, who gave verbal consent to proceed.  History of Present Illness Cheryl [name] is a 68 year old female who presents for a follow-up on her obesity treatment plan and progress.  She is following a journaling plan with a goal of 1400 calories and 80 or more grams of protein, which she meets about 50% of the time. She has not been able to exercise due to a torn meniscus and has lost one pound in the last two months.  Her blood pressure is elevated today, and she is currently taking losartan  hydrochlorothiazide  100/12.5 mg. She requests a refill for this medication.  She has a history of depression with emotional eating behaviors and is being treated with Effexor  and Wellbutrin , for which she requests refills. She has been emotionally eating but has not gained weight recently.  She also has prediabetes and is working on improving her diet, exercise, and weight loss. She is taking metformin  500 mg twice a day and requests a refill.  She has not been able to exercise due to her knee issues. She has a torn meniscus and is planning to have surgery on her right knee before the end of the year. She uses a cane mostly when out and has been trying to strengthen her left knee by  getting back into the pool, but had to stop due to family obligations.  Her father passed away on 2025/08/26and she had to miss an appointment due to his hospice care. She and her sister were involved in his care, and her mother is now living alone but is doing well.      PHYSICAL EXAM:  Blood pressure (!) 147/79, pulse 73, temperature 97.8 F (36.6 C), height 5' 6 (1.676 m), weight 219 lb (99.3 kg), SpO2 99%. Body mass index is 35.35 kg/m.  DIAGNOSTIC DATA REVIEWED:  BMET    Component Value Date/Time   NA 140 02/12/2024 1057   K 4.5 02/12/2024 1057   CL 103 02/12/2024 1057   CO2 21 02/12/2024 1057   GLUCOSE 82 02/12/2024 1057   GLUCOSE 88 01/16/2024 0904   BUN 23 02/12/2024 1057   CREATININE 0.92 02/12/2024 1057   CREATININE 0.81 06/08/2016 1353   CALCIUM 9.5 02/12/2024 1057   GFRNONAA >60 11/26/2023 1548   GFRAA 106 06/10/2020 0953   Lab Results  Component Value Date   HGBA1C 5.7 02/29/2024   HGBA1C 5.8 (H) 02/06/2018   Lab Results  Component Value Date   INSULIN  9.3 05/08/2023   INSULIN  8.7 02/06/2018   Lab Results  Component Value Date   TSH 2.51 02/29/2024   CBC    Component Value Date/Time   WBC 4.4 12/28/2023 0948   RBC 4.48 12/28/2023 0948   HGB 12.6  12/28/2023 0948   HGB 14.2 04/13/2022 1110   HGB 14.3 04/02/2014 0937   HCT 38.8 12/28/2023 0948   HCT 43.6 04/13/2022 1110   PLT 307.0 12/28/2023 0948   PLT 237 04/13/2022 1110   MCV 86.6 12/28/2023 0948   MCV 88 04/13/2022 1110   MCH 28.0 11/26/2023 1548   MCHC 32.5 12/28/2023 0948   RDW 16.3 (H) 12/28/2023 0948   RDW 13.4 04/13/2022 1110   Iron Studies No results found for: IRON, TIBC, FERRITIN, IRONPCTSAT Lipid Panel     Component Value Date/Time   CHOL 212 (H) 02/29/2024 0911   CHOL 240 (H) 05/08/2023 0910   TRIG 50.0 02/29/2024 0911   HDL 77.60 02/29/2024 0911   HDL 76 05/08/2023 0910   CHOLHDL 3 02/29/2024 0911   VLDL 10.0 02/29/2024 0911   LDLCALC 124 (H) 02/29/2024  0911   LDLCALC 147 (H) 05/08/2023 0910   Hepatic Function Panel     Component Value Date/Time   PROT 6.7 02/12/2024 1057   ALBUMIN  4.2 02/12/2024 1057   AST 17 02/12/2024 1057   ALT 15 02/12/2024 1057   ALKPHOS 229 (H) 02/12/2024 1057   BILITOT 0.3 02/12/2024 1057      Component Value Date/Time   TSH 2.51 02/29/2024 0911   Nutritional Lab Results  Component Value Date   VD25OH 41.08 02/29/2024   VD25OH 62.9 05/08/2023   VD25OH 50.4 11/16/2022     Assessment and Plan Assessment & Plan Obesity Obesity with partial adherence to dietary plan. Limited exercise due to right knee medial meniscus tear. Weight loss of one pound in the last two months. Challenges with protein intake due to husband's dietary preferences. - Continue dietary plan with 1400 calories and 80 grams of protein daily - Encourage resumption of exercise as tolerated, particularly aquatic therapy before right knee surgery  Essential Hypertension Elevated blood pressure readings at 142/80 and 147/79. Currently on losartan  hydrochlorothiazide  100/12.5 mg. Blood pressure may be related to knee pain. - Refill losartan  hydrochlorothiazide  100/12.5 mg - Continue diet, exercise and weight loss as discussed today as an important part of the treatment plan  Depression with Emotional Eating Behaviors Depression managed with Effexor  and Wellbutrin . Emotional eating during recent family stress. - Refill Effexor  and Wellbutrin   Prediabetes Prediabetes managed with metformin  500 mg twice daily. Efforts to improve diet and exercise ongoing. - Refill metformin  500 mg twice daily - Continue dietary and exercise modifications  Right Knee OA and  Medial Meniscus Tear, Planned for Surgery Right knee medial meniscus tear with planned total knee replacement surgery. Surgery anticipated before the end of the year. Pre-surgical clearance appointment scheduled for early October. - Proceed with pre-surgical clearance appointment in  early October - Plan for total knee replacement surgery before the end of the year  Left Knee Stiffness, Post Total Knee Replacement Left knee stiffness post total knee replacement. Limited use due to focus on right knee. - Resume aquatic therapy to strengthen left knee before right knee surgery      Trina was informed of the importance of frequent follow up visits to maximize her success with intensive lifestyle modifications for her obesity and obesity related health conditions as recommended by USPSTF and CMS guidelines   Louann Penton, MD

## 2024-04-23 DIAGNOSIS — R262 Difficulty in walking, not elsewhere classified: Secondary | ICD-10-CM | POA: Diagnosis not present

## 2024-04-23 DIAGNOSIS — M6281 Muscle weakness (generalized): Secondary | ICD-10-CM | POA: Diagnosis not present

## 2024-04-23 DIAGNOSIS — S72452D Displaced supracondylar fracture without intracondylar extension of lower end of left femur, subsequent encounter for closed fracture with routine healing: Secondary | ICD-10-CM | POA: Diagnosis not present

## 2024-04-23 DIAGNOSIS — M25662 Stiffness of left knee, not elsewhere classified: Secondary | ICD-10-CM | POA: Diagnosis not present

## 2024-04-28 DIAGNOSIS — R262 Difficulty in walking, not elsewhere classified: Secondary | ICD-10-CM | POA: Diagnosis not present

## 2024-04-28 DIAGNOSIS — S72452D Displaced supracondylar fracture without intracondylar extension of lower end of left femur, subsequent encounter for closed fracture with routine healing: Secondary | ICD-10-CM | POA: Diagnosis not present

## 2024-04-28 DIAGNOSIS — M25662 Stiffness of left knee, not elsewhere classified: Secondary | ICD-10-CM | POA: Diagnosis not present

## 2024-04-28 DIAGNOSIS — M6281 Muscle weakness (generalized): Secondary | ICD-10-CM | POA: Diagnosis not present

## 2024-05-05 DIAGNOSIS — M25662 Stiffness of left knee, not elsewhere classified: Secondary | ICD-10-CM | POA: Diagnosis not present

## 2024-05-05 DIAGNOSIS — S72452D Displaced supracondylar fracture without intracondylar extension of lower end of left femur, subsequent encounter for closed fracture with routine healing: Secondary | ICD-10-CM | POA: Diagnosis not present

## 2024-05-05 DIAGNOSIS — R262 Difficulty in walking, not elsewhere classified: Secondary | ICD-10-CM | POA: Diagnosis not present

## 2024-05-05 DIAGNOSIS — M6281 Muscle weakness (generalized): Secondary | ICD-10-CM | POA: Diagnosis not present

## 2024-05-09 ENCOUNTER — Other Ambulatory Visit (INDEPENDENT_AMBULATORY_CARE_PROVIDER_SITE_OTHER): Payer: Self-pay | Admitting: Family Medicine

## 2024-05-09 DIAGNOSIS — R7303 Prediabetes: Secondary | ICD-10-CM

## 2024-05-12 DIAGNOSIS — M25662 Stiffness of left knee, not elsewhere classified: Secondary | ICD-10-CM | POA: Diagnosis not present

## 2024-05-12 DIAGNOSIS — M1711 Unilateral primary osteoarthritis, right knee: Secondary | ICD-10-CM | POA: Diagnosis not present

## 2024-05-12 DIAGNOSIS — R262 Difficulty in walking, not elsewhere classified: Secondary | ICD-10-CM | POA: Diagnosis not present

## 2024-05-12 DIAGNOSIS — S72452D Displaced supracondylar fracture without intracondylar extension of lower end of left femur, subsequent encounter for closed fracture with routine healing: Secondary | ICD-10-CM | POA: Diagnosis not present

## 2024-05-12 DIAGNOSIS — M25562 Pain in left knee: Secondary | ICD-10-CM | POA: Diagnosis not present

## 2024-05-12 DIAGNOSIS — M6281 Muscle weakness (generalized): Secondary | ICD-10-CM | POA: Diagnosis not present

## 2024-05-19 ENCOUNTER — Ambulatory Visit: Admitting: Family Medicine

## 2024-05-21 ENCOUNTER — Encounter: Payer: Self-pay | Admitting: Family Medicine

## 2024-05-21 ENCOUNTER — Ambulatory Visit: Admitting: Family Medicine

## 2024-05-21 VITALS — BP 128/82 | HR 70 | Temp 98.2°F | Ht 66.0 in | Wt 225.0 lb

## 2024-05-21 DIAGNOSIS — Z23 Encounter for immunization: Secondary | ICD-10-CM | POA: Diagnosis not present

## 2024-05-21 DIAGNOSIS — Z01818 Encounter for other preprocedural examination: Secondary | ICD-10-CM | POA: Diagnosis not present

## 2024-05-21 NOTE — Progress Notes (Signed)
   Established Patient Office Visit   Subjective:  Patient ID: Cheryl [name], female    DOB: [birth year]  Age: 68 y.o. MRN: [id number]  Chief Complaint  Patient presents with   surgical clearance    HPI Patient is present for a surgical clearance for right total knee arthroplasty with Dr. Evalene Chancy. Surgical day is pending until clearance is completed. She is suppose to have CBC, BMET, and A1c completed by orthopedic office. Her last A1c (stable 5.7) on 02/29/2024.     She denies any chest pain, shortness of breath, palpitations, or urinary symptoms.  ROS See HPI above     Objective:   BP 128/82   Pulse 70   Temp 98.2 F (36.8 C) (Oral)   Ht 5' 6 (1.676 m)   Wt 225 lb (102.1 kg)   SpO2 98%   BMI 36.32 kg/m    Physical Exam Vitals reviewed.  Constitutional:      General: She is not in acute distress.    Appearance: Normal appearance. She is obese. She is not ill-appearing, toxic-appearing or diaphoretic.  Eyes:     General:        Right eye: No discharge.        Left eye: No discharge.     Conjunctiva/sclera: Conjunctivae normal.  Cardiovascular:     Rate and Rhythm: Normal rate and regular rhythm.     Heart sounds: Normal heart sounds. No murmur heard.    No friction rub. No gallop.  Pulmonary:     Effort: Pulmonary effort is normal. No respiratory distress.     Breath sounds: Normal breath sounds.  Musculoskeletal:        General: Normal range of motion.  Skin:    General: Skin is warm and dry.  Neurological:     General: No focal deficit present.     Mental Status: She is alert and oriented to person, place, and time. Mental status is at baseline.     Gait: Gait abnormal Joelyn).  Psychiatric:        Mood and Affect: Mood normal.        Behavior: Behavior normal.        Thought Content: Thought content normal.        Judgment: Judgment normal.    EKG completed for surgical clearance with a history of chest pain (years ago), hyperlipidemia,  HTN, palpitations (years ago), and SHOB (years ago). NSR with no ST elevation, a rate of 65 No changes compared to previous EKG on 06/13/2023.     Assessment & Plan:  Pre-op evaluation -     EKG 12-Lead  Immunization due -     Flu vaccine HIGH DOSE PF(Fluzone Trivalent)  -Patient is cleared from a medical standpoint to have a right total knee arthroplasty.  -EKG was completed due to history of chest pain (years ago), hyperlipidemia, HTN, palpitations (years ago), and SHOB (years ago). NSR with no ST elevation, and no change compared to previous EKG.  -Denies chest pain, SHOB, palpitations, or any urinary symptoms. Informed if symptoms occurred from now to the time she has surgery, please follow up.  -Influenza vaccine: Administered  Return in about 10 months (around 03/21/2025) for physical.   Dillin Lofgren, NP

## 2024-05-23 ENCOUNTER — Other Ambulatory Visit: Payer: Self-pay | Admitting: Medical Genetics

## 2024-05-28 ENCOUNTER — Ambulatory Visit (INDEPENDENT_AMBULATORY_CARE_PROVIDER_SITE_OTHER): Admitting: Family Medicine

## 2024-05-28 ENCOUNTER — Encounter (INDEPENDENT_AMBULATORY_CARE_PROVIDER_SITE_OTHER): Payer: Self-pay | Admitting: Family Medicine

## 2024-05-28 VITALS — BP 120/81 | HR 72 | Temp 98.2°F | Ht 66.0 in | Wt 222.0 lb

## 2024-05-28 DIAGNOSIS — Z01818 Encounter for other preprocedural examination: Secondary | ICD-10-CM | POA: Diagnosis not present

## 2024-05-28 DIAGNOSIS — Z6835 Body mass index (BMI) 35.0-35.9, adult: Secondary | ICD-10-CM | POA: Diagnosis not present

## 2024-05-28 DIAGNOSIS — E669 Obesity, unspecified: Secondary | ICD-10-CM

## 2024-05-28 DIAGNOSIS — R7303 Prediabetes: Secondary | ICD-10-CM | POA: Diagnosis not present

## 2024-05-28 DIAGNOSIS — I10 Essential (primary) hypertension: Secondary | ICD-10-CM

## 2024-05-28 NOTE — Progress Notes (Signed)
 Office: [phone]  /  Fax: [phone]  WEIGHT SUMMARY AND BIOMETRICS  Anthropometric Measurements Height: 5' 6 (1.676 m) Weight: 222 lb (100.7 kg) BMI (Calculated): 35.85 Weight at Last Visit: 219 lb Weight Lost Since Last Visit: 0 Weight Gained Since Last Visit: 3 lb Starting Weight: 281 lb Total Weight Loss (lbs): 59 lb (26.8 kg) Peak Weight: 285 lb   Body Composition  Body Fat %: 49.5 % Fat Mass (lbs): 110.2 lbs Muscle Mass (lbs): 106.8 lbs Total Body Water (lbs): 80.4 lbs Visceral Fat Rating : 15   Other Clinical Data Fasting: no Labs: no Today's Visit #: 89 Starting Date: 02/06/18    Chief Complaint: OBESITY   History of Present Illness Cheryl [name] is a 68 year old female with obesity, prediabetes, and hypertension who presents for obesity treatment and progress assessment.  She has been prescribed a dietary plan targeting 1400 calories and 60 to 70 grams of protein daily. Although she is not journaling her intake, she estimates meeting these goals about 60% of the time. She is not currently engaging in physical exercise and has experienced a weight gain of three pounds over the past month.  For her prediabetes, she is taking metformin  500 mg twice daily and is attempting to reduce simple carbohydrates in her diet.  Her hypertension is managed with losartan  hydrochlorothiazide  100/12.5 mg.  She is preparing for right knee replacement surgery on October 30th and requires preoperative blood work, including a CBC and CMP. Her husband will assist her post-surgery, and she plans to prepare meals in advance to manage her diet during recovery.      PHYSICAL EXAM:  Blood pressure 120/81, pulse 72, temperature 98.2 F (36.8 C), height 5' 6 (1.676 m), weight 222 lb (100.7 kg), SpO2 98%. Body mass index is 35.83 kg/m.  DIAGNOSTIC DATA REVIEWED:  BMET    Component Value Date/Time   NA 140 02/12/2024 1057   K 4.5 02/12/2024 1057   CL  103 02/12/2024 1057   CO2 21 02/12/2024 1057   GLUCOSE 82 02/12/2024 1057   GLUCOSE 88 01/16/2024 0904   BUN 23 02/12/2024 1057   CREATININE 0.92 02/12/2024 1057   CREATININE 0.81 06/08/2016 1353   CALCIUM 9.5 02/12/2024 1057   GFRNONAA >60 11/26/2023 1548   GFRAA 106 06/10/2020 0953   Lab Results  Component Value Date   HGBA1C 5.7 02/29/2024   HGBA1C 5.8 (H) 02/06/2018   Lab Results  Component Value Date   INSULIN  9.3 05/08/2023   INSULIN  8.7 02/06/2018   Lab Results  Component Value Date   TSH 2.51 02/29/2024   CBC    Component Value Date/Time   WBC 4.4 12/28/2023 0948   RBC 4.48 12/28/2023 0948   HGB 12.6 12/28/2023 0948   HGB 14.2 04/13/2022 1110   HGB 14.3 04/02/2014 0937   HCT 38.8 12/28/2023 0948   HCT 43.6 04/13/2022 1110   PLT 307.0 12/28/2023 0948   PLT 237 04/13/2022 1110   MCV 86.6 12/28/2023 0948   MCV 88 04/13/2022 1110   MCH 28.0 11/26/2023 1548   MCHC 32.5 12/28/2023 0948   RDW 16.3 (H) 12/28/2023 0948   RDW 13.4 04/13/2022 1110   Iron Studies No results found for: IRON, TIBC, FERRITIN, IRONPCTSAT Lipid Panel     Component Value Date/Time   CHOL 212 (H) 02/29/2024 0911   CHOL 240 (H) 05/08/2023 0910   TRIG 50.0 02/29/2024 0911   HDL 77.60 02/29/2024 0911   HDL 76 05/08/2023  0910   CHOLHDL 3 02/29/2024 0911   VLDL 10.0 02/29/2024 0911   LDLCALC 124 (H) 02/29/2024 0911   LDLCALC 147 (H) 05/08/2023 0910   Hepatic Function Panel     Component Value Date/Time   PROT 6.7 02/12/2024 1057   ALBUMIN  4.2 02/12/2024 1057   AST 17 02/12/2024 1057   ALT 15 02/12/2024 1057   ALKPHOS 229 (H) 02/12/2024 1057   BILITOT 0.3 02/12/2024 1057      Component Value Date/Time   TSH 2.51 02/29/2024 0911   Nutritional Lab Results  Component Value Date   VD25OH 41.08 02/29/2024   VD25OH 62.9 05/08/2023   VD25OH 50.4 11/16/2022     Assessment and Plan Assessment & Plan Preoperative labs for right knee replacement She is scheduled for  right knee replacement surgery on October 30th. Preoperative blood work including CBC and CMP is required. Discussion about post-surgery challenges, including potential for increased snacking and strategies to manage nutrition during recovery, was conducted. She is aware of the need to stop certain medications and vitamins prior to surgery. Emphasis on protein intake pre-surgery to enhance healing post-surgery was discussed, as studies show protein is a key predictor of recovery quality in individuals over 65. - Order CBC and CMP for preoperative evaluation. - Discuss post-surgery nutrition strategies, including preparing meals in advance. - Ensure she stops certain medications and vitamins as advised prior to surgery. - Emphasize increased protein intake pre-surgery to enhance post-surgery healing.  Obesity Obesity management is ongoing. She is not currently journaling her diet but is trying to be mindful, meeting her caloric and protein goals about 60% of the time. She is not exercising and has gained 3 pounds in the last month. Post-surgery, there is concern about increased temptation to snack due to downtime and her husband's potential to bring her comfort foods. Strategies discussed include preparing soups and slow cooker meals in advance to manage caloric intake. - Encourage mindful eating and adherence to caloric and protein goals. - Prepare soups and slow cooker meals in advance to manage caloric intake post-surgery. - Discuss strategies to avoid snacking during downtime post-surgery.  Prediabetes Prediabetes is being managed with metformin  500 mg twice daily and dietary modifications to decrease simple carbohydrates. Continued focus on dietary management is necessary, especially post-surgery. - Continue metformin  500 mg twice daily. - Encourage dietary modifications to decrease simple carbohydrates.  Hypertension Hypertension is currently well-controlled with losartan  hydrochlorothiazide   100/12.5 mg. Blood pressure today is 120/81 mmHg. Lifestyle modifications including decreased sodium intake, increased exercise, and weight loss are part of the management plan. - Continue losartan  hydrochlorothiazide  100/12.5 mg. - Encourage lifestyle modifications including decreased sodium intake, increased exercise, and weight loss.     Carlo was informed of the importance of frequent follow up visits to maximize her success with intensive lifestyle modifications for her obesity and obesity related health conditions as recommended by USPSTF and CMS guidelines   Louann Penton, MD

## 2024-05-28 NOTE — Addendum Note (Signed)
 Addended by: LAFE BAKER CROME on: 05/28/2024 02:31 PM   Modules accepted: Level of Service

## 2024-05-29 LAB — CBC WITH DIFFERENTIAL/PLATELET
Basophils Absolute: 0.1 x10E3/uL (ref 0.0–0.2)
Basos: 1 %
EOS (ABSOLUTE): 0.1 x10E3/uL (ref 0.0–0.4)
Eos: 2 %
Hematocrit: 38.7 % (ref 34.0–46.6)
Hemoglobin: 12.1 g/dL (ref 11.1–15.9)
Immature Grans (Abs): 0 x10E3/uL (ref 0.0–0.1)
Immature Granulocytes: 0 %
Lymphocytes Absolute: 0.9 x10E3/uL (ref 0.7–3.1)
Lymphs: 21 %
MCH: 28.5 pg (ref 26.6–33.0)
MCHC: 31.3 g/dL — ABNORMAL LOW (ref 31.5–35.7)
MCV: 91 fL (ref 79–97)
Monocytes Absolute: 0.5 x10E3/uL (ref 0.1–0.9)
Monocytes: 12 %
Neutrophils Absolute: 2.9 x10E3/uL (ref 1.4–7.0)
Neutrophils: 64 %
Platelets: 277 x10E3/uL (ref 150–450)
RBC: 4.25 x10E6/uL (ref 3.77–5.28)
RDW: 13.9 % (ref 11.7–15.4)
WBC: 4.4 x10E3/uL (ref 3.4–10.8)

## 2024-05-29 LAB — CMP14+EGFR
ALT: 14 IU/L (ref 0–32)
AST: 17 IU/L (ref 0–40)
Albumin: 4.3 g/dL (ref 3.9–4.9)
Alkaline Phosphatase: 165 IU/L — ABNORMAL HIGH (ref 49–135)
BUN/Creatinine Ratio: 24 (ref 12–28)
BUN: 26 mg/dL (ref 8–27)
Bilirubin Total: 0.5 mg/dL (ref 0.0–1.2)
CO2: 23 mmol/L (ref 20–29)
Calcium: 9.3 mg/dL (ref 8.7–10.3)
Chloride: 101 mmol/L (ref 96–106)
Creatinine, Ser: 1.1 mg/dL — ABNORMAL HIGH (ref 0.57–1.00)
Globulin, Total: 2.3 g/dL (ref 1.5–4.5)
Glucose: 78 mg/dL (ref 70–99)
Potassium: 4.6 mmol/L (ref 3.5–5.2)
Sodium: 138 mmol/L (ref 134–144)
Total Protein: 6.6 g/dL (ref 6.0–8.5)
eGFR: 55 mL/min/1.73 — ABNORMAL LOW (ref >59)

## 2024-06-02 DIAGNOSIS — M1711 Unilateral primary osteoarthritis, right knee: Secondary | ICD-10-CM | POA: Diagnosis not present

## 2024-06-12 DIAGNOSIS — M25761 Osteophyte, right knee: Secondary | ICD-10-CM | POA: Diagnosis not present

## 2024-06-12 DIAGNOSIS — M1711 Unilateral primary osteoarthritis, right knee: Secondary | ICD-10-CM | POA: Diagnosis not present

## 2024-06-12 DIAGNOSIS — G8918 Other acute postprocedural pain: Secondary | ICD-10-CM | POA: Diagnosis not present

## 2024-06-17 DIAGNOSIS — M1711 Unilateral primary osteoarthritis, right knee: Secondary | ICD-10-CM | POA: Diagnosis not present

## 2024-06-17 DIAGNOSIS — M6281 Muscle weakness (generalized): Secondary | ICD-10-CM | POA: Diagnosis not present

## 2024-06-17 DIAGNOSIS — R262 Difficulty in walking, not elsewhere classified: Secondary | ICD-10-CM | POA: Diagnosis not present

## 2024-06-17 DIAGNOSIS — M25661 Stiffness of right knee, not elsewhere classified: Secondary | ICD-10-CM | POA: Diagnosis not present

## 2024-06-19 ENCOUNTER — Ambulatory Visit (INDEPENDENT_AMBULATORY_CARE_PROVIDER_SITE_OTHER): Admitting: Family Medicine

## 2024-06-20 DIAGNOSIS — R7301 Impaired fasting glucose: Secondary | ICD-10-CM | POA: Diagnosis not present

## 2024-06-20 DIAGNOSIS — G8918 Other acute postprocedural pain: Secondary | ICD-10-CM | POA: Diagnosis not present

## 2024-06-20 DIAGNOSIS — F321 Major depressive disorder, single episode, moderate: Secondary | ICD-10-CM | POA: Diagnosis not present

## 2024-06-20 DIAGNOSIS — I10 Essential (primary) hypertension: Secondary | ICD-10-CM | POA: Diagnosis not present

## 2024-06-20 DIAGNOSIS — R112 Nausea with vomiting, unspecified: Secondary | ICD-10-CM | POA: Diagnosis not present

## 2024-06-20 DIAGNOSIS — G4733 Obstructive sleep apnea (adult) (pediatric): Secondary | ICD-10-CM | POA: Diagnosis not present

## 2024-06-20 DIAGNOSIS — M62838 Other muscle spasm: Secondary | ICD-10-CM | POA: Diagnosis not present

## 2024-06-20 DIAGNOSIS — Z809 Family history of malignant neoplasm, unspecified: Secondary | ICD-10-CM | POA: Diagnosis not present

## 2024-06-20 DIAGNOSIS — F419 Anxiety disorder, unspecified: Secondary | ICD-10-CM | POA: Diagnosis not present

## 2024-06-20 DIAGNOSIS — Z8249 Family history of ischemic heart disease and other diseases of the circulatory system: Secondary | ICD-10-CM | POA: Diagnosis not present

## 2024-06-25 DIAGNOSIS — Z471 Aftercare following joint replacement surgery: Secondary | ICD-10-CM | POA: Diagnosis not present

## 2024-06-25 DIAGNOSIS — Z96651 Presence of right artificial knee joint: Secondary | ICD-10-CM | POA: Diagnosis not present

## 2024-06-25 DIAGNOSIS — R262 Difficulty in walking, not elsewhere classified: Secondary | ICD-10-CM | POA: Diagnosis not present

## 2024-06-25 DIAGNOSIS — M6281 Muscle weakness (generalized): Secondary | ICD-10-CM | POA: Diagnosis not present

## 2024-06-25 DIAGNOSIS — M1711 Unilateral primary osteoarthritis, right knee: Secondary | ICD-10-CM | POA: Diagnosis not present

## 2024-06-25 DIAGNOSIS — M25561 Pain in right knee: Secondary | ICD-10-CM | POA: Diagnosis not present

## 2024-06-25 DIAGNOSIS — M25661 Stiffness of right knee, not elsewhere classified: Secondary | ICD-10-CM | POA: Diagnosis not present

## 2024-06-27 DIAGNOSIS — M6281 Muscle weakness (generalized): Secondary | ICD-10-CM | POA: Diagnosis not present

## 2024-06-27 DIAGNOSIS — M25661 Stiffness of right knee, not elsewhere classified: Secondary | ICD-10-CM | POA: Diagnosis not present

## 2024-06-27 DIAGNOSIS — R262 Difficulty in walking, not elsewhere classified: Secondary | ICD-10-CM | POA: Diagnosis not present

## 2024-06-27 DIAGNOSIS — M1711 Unilateral primary osteoarthritis, right knee: Secondary | ICD-10-CM | POA: Diagnosis not present

## 2024-07-01 DIAGNOSIS — R262 Difficulty in walking, not elsewhere classified: Secondary | ICD-10-CM | POA: Diagnosis not present

## 2024-07-01 DIAGNOSIS — M1711 Unilateral primary osteoarthritis, right knee: Secondary | ICD-10-CM | POA: Diagnosis not present

## 2024-07-01 DIAGNOSIS — M25661 Stiffness of right knee, not elsewhere classified: Secondary | ICD-10-CM | POA: Diagnosis not present

## 2024-07-01 DIAGNOSIS — M6281 Muscle weakness (generalized): Secondary | ICD-10-CM | POA: Diagnosis not present

## 2024-07-02 ENCOUNTER — Ambulatory Visit (INDEPENDENT_AMBULATORY_CARE_PROVIDER_SITE_OTHER): Payer: Self-pay | Admitting: Family Medicine

## 2024-07-02 ENCOUNTER — Encounter (INDEPENDENT_AMBULATORY_CARE_PROVIDER_SITE_OTHER): Payer: Self-pay | Admitting: Family Medicine

## 2024-07-02 VITALS — BP 127/72 | HR 79 | Temp 98.1°F | Ht 66.0 in | Wt 219.0 lb

## 2024-07-02 DIAGNOSIS — M25561 Pain in right knee: Secondary | ICD-10-CM

## 2024-07-02 DIAGNOSIS — R7303 Prediabetes: Secondary | ICD-10-CM

## 2024-07-02 DIAGNOSIS — Z6835 Body mass index (BMI) 35.0-35.9, adult: Secondary | ICD-10-CM | POA: Diagnosis not present

## 2024-07-02 DIAGNOSIS — E669 Obesity, unspecified: Secondary | ICD-10-CM

## 2024-07-02 DIAGNOSIS — F5089 Other specified eating disorder: Secondary | ICD-10-CM

## 2024-07-02 DIAGNOSIS — I10 Essential (primary) hypertension: Secondary | ICD-10-CM

## 2024-07-02 DIAGNOSIS — F3289 Other specified depressive episodes: Secondary | ICD-10-CM

## 2024-07-02 MED ORDER — VENLAFAXINE HCL 75 MG PO TABS: 75.0000 mg | ORAL_TABLET | Freq: Every day | ORAL | 0 refills | Status: AC

## 2024-07-02 MED ORDER — LOSARTAN POTASSIUM-HCTZ 100-12.5 MG PO TABS
1.0000 | ORAL_TABLET | Freq: Every day | ORAL | 0 refills | Status: AC
Start: 2024-07-02 — End: ?

## 2024-07-02 MED ORDER — BUPROPION HCL ER (SR) 200 MG PO TB12: 200.0000 mg | ORAL_TABLET | Freq: Every day | ORAL | 0 refills | Status: AC

## 2024-07-02 MED ORDER — METFORMIN HCL 500 MG PO TABS: 500.0000 mg | ORAL_TABLET | Freq: Two times a day (BID) | ORAL | 0 refills | Status: AC

## 2024-07-02 NOTE — Progress Notes (Signed)
 Office: [phone]  /  Fax: [phone]  WEIGHT SUMMARY AND BIOMETRICS  Anthropometric Measurements Height: 5' 6 (1.676 m) Weight: 219 lb (99.3 kg) BMI (Calculated): 35.36 Weight at Last Visit: 222 lb Weight Lost Since Last Visit: 3 lb Weight Gained Since Last Visit: 0 Starting Weight: 281 lb Total Weight Loss (lbs): 62 lb (28.1 kg) Peak Weight: 285 lb   Body Composition  Body Fat %: 50.6 % Fat Mass (lbs): 111 lbs Muscle Mass (lbs): 102.8 lbs Total Body Water (lbs): 82.6 lbs Visceral Fat Rating : 15   Other Clinical Data Fasting: no Labs: no Today's Visit #: 90 Starting Date: 02/06/18    Chief Complaint: OBESITY  History of Present Illness Cheryl [name] is a 68 year old female with obesity, hypertension, and prediabetes who presents for obesity treatment and progress assessment.  She is adhering to a dietary plan targeting 1400 calories and 80 or more grams of protein daily, achieving this goal about 50% of the time. She engages in physical therapy three days a week for 45 minutes and has lost three pounds in the last month. She recently underwent knee surgery on her right knee four to five weeks ago and is managing pain with oxycodone , which causes drowsiness. She is transitioning from a walker to a cane.  Her hypertension is managed with losartan  and hydrochlorothiazide , 100/12.5 mg daily, with her blood pressure controlled at 127/72 mmHg. She requests a refill of her medication.  She is on bupropion  SR 200 mg daily and Effexor  75 mg daily for emotional eating behaviors and reports stability with these medications. She requests refills for both medications.  She has prediabetes and is on metformin  500 mg twice daily. Her most recent hemoglobin A1c was 5.7% in July 2025, and she requests a refill of her medication.  She discusses her plans for Thanksgiving, focusing on a menu that emphasizes protein intake. She mentions an increase in cravings for  sweets post-surgery but feels she is managing her protein intake well and is exploring protein-rich food options like protein bagels.      PHYSICAL EXAM:  Blood pressure 127/72, pulse 79, temperature 98.1 F (36.7 C), height 5' 6 (1.676 m), weight 219 lb (99.3 kg), SpO2 93%. Body mass index is 35.35 kg/m.  DIAGNOSTIC DATA REVIEWED:  BMET    Component Value Date/Time   NA 138 05/28/2024 1008   K 4.6 05/28/2024 1008   CL 101 05/28/2024 1008   CO2 23 05/28/2024 1008   GLUCOSE 78 05/28/2024 1008   GLUCOSE 88 01/16/2024 0904   BUN 26 05/28/2024 1008   CREATININE 1.10 (H) 05/28/2024 1008   CREATININE 0.81 06/08/2016 1353   CALCIUM 9.3 05/28/2024 1008   GFRNONAA >60 11/26/2023 1548   GFRAA 106 06/10/2020 0953   Lab Results  Component Value Date   HGBA1C 5.7 02/29/2024   HGBA1C 5.8 (H) 02/06/2018   Lab Results  Component Value Date   INSULIN  9.3 05/08/2023   INSULIN  8.7 02/06/2018   Lab Results  Component Value Date   TSH 2.51 02/29/2024   CBC    Component Value Date/Time   WBC 4.4 05/28/2024 1008   WBC 4.4 12/28/2023 0948   RBC 4.25 05/28/2024 1008   RBC 4.48 12/28/2023 0948   HGB 12.1 05/28/2024 1008   HGB 14.3 04/02/2014 0937   HCT 38.7 05/28/2024 1008   PLT 277 05/28/2024 1008   MCV 91 05/28/2024 1008   MCH 28.5 05/28/2024 1008   MCH 28.0 11/26/2023  1548   MCHC 31.3 (L) 05/28/2024 1008   MCHC 32.5 12/28/2023 0948   RDW 13.9 05/28/2024 1008   Iron Studies No results found for: IRON, TIBC, FERRITIN, IRONPCTSAT Lipid Panel     Component Value Date/Time   CHOL 212 (H) 02/29/2024 0911   CHOL 240 (H) 05/08/2023 0910   TRIG 50.0 02/29/2024 0911   HDL 77.60 02/29/2024 0911   HDL 76 05/08/2023 0910   CHOLHDL 3 02/29/2024 0911   VLDL 10.0 02/29/2024 0911   LDLCALC 124 (H) 02/29/2024 0911   LDLCALC 147 (H) 05/08/2023 0910   Hepatic Function Panel     Component Value Date/Time   PROT 6.6 05/28/2024 1008   ALBUMIN  4.3 05/28/2024 1008   AST 17  05/28/2024 1008   ALT 14 05/28/2024 1008   ALKPHOS 165 (H) 05/28/2024 1008   BILITOT 0.5 05/28/2024 1008      Component Value Date/Time   TSH 2.51 02/29/2024 0911   Nutritional Lab Results  Component Value Date   VD25OH 41.08 02/29/2024   VD25OH 62.9 05/08/2023   VD25OH 50.4 11/16/2022     Assessment and Plan Assessment & Plan Obesity She is adhering to a 1400 calorie diet with 80 or more grams of protein, meeting these criteria about 50% of the time. She has lost 3 pounds in the last month. She is engaging in physical therapy three times a week for 45 minutes. She is managing her diet well, especially around upcoming Thanksgiving, with a focus on protein intake and managing carbohydrate cravings post-surgery. - Continue 1400 calorie diet with 80 or more grams of protein. - Continue physical therapy three times a week. - Encouraged use of protein-rich foods like protein bagels. - Discussed strategies for managing holiday eating, including sharing food with others and being mindful of calorie intake.  Essential hypertension Hypertension is well-controlled with losartan  and hydrochlorothiazide  100/12.5 mg daily. Blood pressure today is 127/72 mmHg. - Continue losartan  and hydrochlorothiazide  100/12.5 mg daily. - Refilled prescription for losartan  and hydrochlorothiazide .  Prediabetes Managed with metformin  500 mg twice daily. Recent hemoglobin A1c was 5.7% in July. - Continue metformin  500 mg twice daily. - Refilled prescription for metformin .  Right knee postoperative pain Postoperative pain in the right knee following surgery four weeks ago. Pain is manageable but requires oxycodone  for relief, which causes drowsiness. She is transitioning from a walker to a cane and is engaging in physical therapy. - Continue physical therapy three times a week. - Transition from walker to cane as tolerated after approved by Ortho and PT  Emotional eating behaviors Managed with bupropion   200 mg daily and Effexor  75 mg daily. She reports stability on these medications. - Continue bupropion  200 mg daily. - Continue Effexor  75 mg daily. - Refilled prescriptions for bupropion  and Effexor .      Patients who are on anti-obesity medications are counseled on the importance of maintaining healthy lifestyle habits, including balanced nutrition, regular physical activity, and behavioral modifications,  Medication is an adjunct to, not a replacement for, lifestyle changes and that the long-term success and weight maintenance depend on continued adherence to these strategies.   Daijah was informed of the importance of frequent follow up visits to maximize her success with intensive lifestyle modifications for her obesity and obesity related health conditions as recommended by USPSTF and CMS guidelines  Louann Penton, MD

## 2024-07-03 DIAGNOSIS — M25661 Stiffness of right knee, not elsewhere classified: Secondary | ICD-10-CM | POA: Diagnosis not present

## 2024-07-03 DIAGNOSIS — M1711 Unilateral primary osteoarthritis, right knee: Secondary | ICD-10-CM | POA: Diagnosis not present

## 2024-07-03 DIAGNOSIS — M6281 Muscle weakness (generalized): Secondary | ICD-10-CM | POA: Diagnosis not present

## 2024-07-03 DIAGNOSIS — R262 Difficulty in walking, not elsewhere classified: Secondary | ICD-10-CM | POA: Diagnosis not present

## 2024-07-04 NOTE — Progress Notes (Signed)
   07/04/2024  Patient ID: Cheryl [name], female   DOB: 1955/11/02, 68 y.o.   MRN: [id number]  Pharmacy Quality Measure Review  This patient is appearing on a report for being at risk of failing the adherence measure for diabetes medications this calendar year.   Medication: Metformin  Last fill date: 07/02/24 for 90 day supply  Insurance report was not up to date. No action needed at this time.   Jon VEAR Lindau, PharmD Clinical Pharmacist [phone]

## 2024-07-07 ENCOUNTER — Other Ambulatory Visit

## 2024-07-07 DIAGNOSIS — Z006 Encounter for examination for normal comparison and control in clinical research program: Secondary | ICD-10-CM

## 2024-07-07 DIAGNOSIS — M6281 Muscle weakness (generalized): Secondary | ICD-10-CM | POA: Diagnosis not present

## 2024-07-07 DIAGNOSIS — M1711 Unilateral primary osteoarthritis, right knee: Secondary | ICD-10-CM | POA: Diagnosis not present

## 2024-07-07 DIAGNOSIS — M25661 Stiffness of right knee, not elsewhere classified: Secondary | ICD-10-CM | POA: Diagnosis not present

## 2024-07-07 DIAGNOSIS — R262 Difficulty in walking, not elsewhere classified: Secondary | ICD-10-CM | POA: Diagnosis not present

## 2024-07-07 NOTE — Addendum Note (Signed)
 Addended by: Dim Meisinger K on: 07/07/2024 01:37 PM   Modules accepted: Orders

## 2024-07-16 DIAGNOSIS — R262 Difficulty in walking, not elsewhere classified: Secondary | ICD-10-CM | POA: Diagnosis not present

## 2024-07-16 DIAGNOSIS — M25661 Stiffness of right knee, not elsewhere classified: Secondary | ICD-10-CM | POA: Diagnosis not present

## 2024-07-16 DIAGNOSIS — M1711 Unilateral primary osteoarthritis, right knee: Secondary | ICD-10-CM | POA: Diagnosis not present

## 2024-07-16 DIAGNOSIS — M6281 Muscle weakness (generalized): Secondary | ICD-10-CM | POA: Diagnosis not present

## 2024-07-17 ENCOUNTER — Ambulatory Visit: Admitting: Family Medicine

## 2024-07-17 DIAGNOSIS — Z0000 Encounter for general adult medical examination without abnormal findings: Secondary | ICD-10-CM

## 2024-07-17 NOTE — Patient Instructions (Signed)
 I really enjoyed getting to talk with you today! I am available on Tuesdays and Thursdays for virtual visits if you have any questions or concerns, or if I can be of any further assistance.   CHECKLIST FROM ANNUAL WELLNESS VISIT:  -Follow up (please call to schedule if not scheduled after visit):   -yearly for annual wellness visit with primary care office  Here is a list of your preventive care/health maintenance measures and the plan for each if any are due:  PLAN For any measures below that may be due:    1. Please check with Dr. Cleotilde about the pap smear.   2. Can get covid vaccine at the pharmacy if you wish. Please let us  know if you get any vaccines at the pharmacy so that we can update your record.   Health Maintenance  Topic Date Due   COVID-19 Vaccine (1) Never done   Cervical Cancer Screening (Pap smear)  01/12/2012   Medicare Annual Wellness (AWV)  07/17/2025   Mammogram  10/23/2025   Colonoscopy  07/23/2028   DTaP/Tdap/Td (5 - Td or Tdap) 07/07/2033   Pneumococcal Vaccine: 50+ Years  Completed   Influenza Vaccine  Completed   Bone Density Scan  Completed   Hepatitis C Screening  Completed   Zoster Vaccines- Shingrix  Completed   Meningococcal B Vaccine  Aged Out    -See a dentist at least yearly  -Get your eyes checked and then per your eye specialist's recommendations  -Other issues addressed today:   -I have included below further information regarding a healthy whole foods based diet, physical activity guidelines for adults, stress management and opportunities for social connections. I hope you find this information useful.   -----------------------------------------------------------------------------------------------------------------------------------------------------------------------------------------------------------------------------------------------------------    NUTRITION: -eat real food: lots of colorful vegetables (half the plate) and  fruits -5-7 servings of vegetables and fruits per day (fresh or steamed is best), exp. 2 servings of vegetables with lunch and dinner and 2 servings of fruit per day. Berries and greens such as kale and collards are great choices.  -consume on a regular basis:  fresh fruits, fresh veggies, fish, nuts, seeds, healthy oils (such as olive oil, avocado oil), whole grains (make sure for bread/pasta/crackers/etc., that the first ingredient on label contains the word whole), legumes. -can eat small amounts of dairy and lean meat (no larger than the palm of your hand), but avoid processed meats such as ham, bacon, lunch meat, etc. -drink water -try to avoid fast food and pre-packaged foods, processed meat, ultra processed foods/beverages (donuts, candy, etc.) -most experts advise limiting sodium to < 2300mg  per day, should limit further is any chronic conditions such as high blood pressure, heart disease, diabetes, etc. The American Heart Association advised that < 1500mg  is is ideal -try to avoid foods/beverages that contain any ingredients with names you do not recognize  -try to avoid foods/beverages  with added sugar or sweeteners/sweets  -try to avoid sweet drinks (including diet drinks): soda, juice, Gatorade, sweet tea, power drinks, diet drinks -try to avoid white rice, white bread, pasta (unless whole grain)  EXERCISE GUIDELINES FOR ADULTS: -if you wish to increase your physical activity, do so gradually and with the approval of your doctor -STOP and seek medical care immediately if you have any chest pain, chest discomfort or trouble breathing when starting or increasing exercise  -move and stretch your body, legs, feet and arms when sitting for long periods -Physical activity guidelines for optimal health in adults: -get at  least 150 minutes per week of moderate exercise (can talk, but not sing); this is about 20-30 minutes of sustained activity 5-7 days per week or two 10-15 minute episodes of  sustained activity 5-7 days per week -do some muscle building/resistance training/strength training at least 2 days per week  -balance exercises 3+ days per week:   Stand somewhere where you have something sturdy to hold onto if you lose balance    1) lift up on toes, then back down, start with 5x per day and work up to 20x   2) stand and lift one leg straight out to the side so that foot is a few inches of the floor, start with 5x each side and work up to 20x each side   3) stand on one foot, start with 5 seconds each side and work up to 20 seconds on each side  If you need ideas or help with getting more active:  -Silver sneakers https://tools.silversneakers.com  -Walk with a Doc: Http://www.duncan-williams.com/  -try to include resistance (weight lifting/strength building) and balance exercises twice per week: or the following link for ideas: http://castillo-powell.com/  buyducts.dk  STRESS MANAGEMENT: -can try meditating, or just sitting quietly with deep breathing while intentionally relaxing all parts of your body for 5 minutes daily -if you need further help with stress, anxiety or depression please follow up with your primary doctor or contact the wonderful folks at [facility]: [phone]  SOCIAL CONNECTIONS: -options in [place] if you wish to engage in more social and exercise related activities:  -Silver sneakers https://tools.silversneakers.com  -Walk with a Doc: Http://www.duncan-williams.com/  -Check out the [facility] Active Adults 50+ section on the [place] [employer] companies (hiking clubs, book clubs, cards and games, chess, exercise classes, aquatic classes and much more) - see the website for details: https://www.[place].gov/departments/parks-recreation/active-adults50  -YouTube has lots of exercise videos for different ages and abilities as well  -[facility] (a variety of indoor and outdoor inperson activities for adults). [phone]. [address]  -Virtual Online Classes (a variety of topics): see seniorplanet.org or call ([phone]  -consider volunteering at a school, hospice center, church, senior center or elsewhere

## 2024-07-17 NOTE — Progress Notes (Signed)
 To check-in Nursing staff: Please review Meds, Allergies, PMH, and care teams and update Please request wt, home BP, etc and update vitals if able Please complete the following flowsheets under the Medicare Visits Tab:  Medicare Wellness  Stress  PHQ-2-9  Exercise  Social Connections  Method of visit: Not In Person ----------------------------------------------------------------------------------------------------------------------------------------------------------------------------------------------------------------------  Because this visit was a virtual/telehealth visit, some criteria may be missing or patient reported. Any vitals not documented were not able to be obtained and vitals that have been documented are patient reported.    MEDICARE ANNUAL PREVENTIVE VISIT WITH PROVIDER: (Welcome to Medicare, initial annual wellness or annual wellness exam)  Virtual Visit via Video Note  I connected with Cheryl [name] on 07/17/24 by a video enabled telemedicine application and verified that I am speaking with the correct person using two identifiers.  Location patient: home Location provider:work or home office Persons participating in the virtual visit: patient, provider  Concerns and/or follow up today: detailed intake and risks/health assessment completed in flowsheets and below - please see for details. In rehab fo s/p knee replacement. Reports is doing well.    See HM section in Epic for other details of completed HM.    ROS: negative for report of fevers, unintentional weight loss, vision changes, vision loss, hearing loss or change, chest pain, sob, hemoptysis, melena, hematochezia, hematuria or bleeding or bruising  Patient-completed extensive health risk assessment - reviewed and discussed with the patient: See Health Risk Assessment completed with patient prior to the visit either above or in recent phone note. This was reviewed in detailed with the patient today  and appropriate recommendations, orders and referrals were placed as needed per Summary below and patient instructions.   Review of Medical History: -PMH, PSH, Family History and current specialty and care providers reviewed and updated and listed below   Patient Care Team: Billy Philippe SAUNDERS, NP as PCP - General (Family Medicine) Cleotilde Ronal RAMAN, MD as Consulting Physician (Gynecology) Specialists, [facility] (Orthopedic Surgery) Cleotilde Elspeth CROME, OD (Optometry) Nonah Camellia ORN, MD as Consulting Physician (Optometry) Armbruster, Elspeth SQUIBB, MD as Consulting Physician (Gastroenterology) Verdon Louann BIRCH, MD as Consulting Physician (Family Medicine) Caleen Dirks, MD as Consulting Physician (Internal Medicine) Caleen Dirks, MD as Consulting Physician (Internal Medicine)   Past Medical History:  Diagnosis Date   Allergy    Arthritis    Chest pain    Depression    Fatigue    GERD (gastroesophageal reflux disease)    Hyperlipidemia    Hypertension    Joint pain    Obesity    Osteopenia    Palpitation    Sleep apnea    uses CPAP    SOB (shortness of breath)    Urinary incontinence     Past Surgical History:  Procedure Laterality Date   ABDOMINAL HYSTERECTOMY  08/10/2000   TAH/RSO   BREAST SURGERY     Biopsy-Benign   COLOSTOMY  2000   Dr.Patterson   KNEE ARTHROSCOPY Bilateral    LAPAROSCOPIC CHOLECYSTECTOMY  2010   LAPAROTOMY  1990   Fibroid removed-ovarian cyst    ORIF FEMUR FRACTURE Left 11/23/2023   Procedure: OPEN REDUCTION INTERNAL FIXATION (ORIF) DISTAL FEMUR FRACTURE;  Surgeon: Beverley Evalene BIRCH, MD;  Location: [facility] OR;  Service: Orthopedics;  Laterality: Left;   RETINAL DETACHMENT SURGERY Right    It was torn   RHINOPLASTY      Social History   Socioeconomic History   Marital status: Married  Spouse name: Sia Gabrielsen   Number of children: 2   Years of education: 16   Highest education level: Bachelor's degree (e.g., BA, AB, BS)   Occupational History   Occupation: Lawyer  Tobacco Use   Smoking status: Former    Current packs/day: 0.00    Average packs/day: 0.5 packs/day for 10.0 years (5.0 ttl pk-yrs)    Types: Cigarettes    Quit date: 08/15/2007    Years since quitting: 16.9   Smokeless tobacco: Never  Vaping Use   Vaping status: Never Used  Substance and Sexual Activity   Alcohol use: Yes    Alcohol/week: 5.0 standard drinks of alcohol    Types: 2 Glasses of wine, 3 Standard drinks or equivalent per week    Comment: 3 drinks per week per pt    Drug use: No   Sexual activity: Not Currently    Birth control/protection: Post-menopausal, Surgical    Comment: hysterectomy  Other Topics Concern   Not on file  Social History Narrative   2 adopted children, Eric (17) and Buyer, Retail (18)   Social Drivers of Corporate Investment Banker Strain: Low Risk  (06/26/2023)   Overall Financial Resource Strain (CARDIA)    Difficulty of Paying Living Expenses: Not hard at all  Food Insecurity: No Food Insecurity (11/23/2023)   Hunger Vital Sign    Worried About Running Out of Food in the Last Year: Never true    Ran Out of Food in the Last Year: Never true  Transportation Needs: No Transportation Needs (11/23/2023)   PRAPARE - Administrator, Civil Service (Medical): No    Lack of Transportation (Non-Medical): No  Physical Activity: Sufficiently Active (07/17/2024)   Exercise Vital Sign    Days of Exercise per Week: 7 days    Minutes of Exercise per Session: 30 min  Stress: No Stress Concern Present (07/17/2024)   [employer] of Occupational Health - Occupational Stress Questionnaire    Feeling of Stress: Not at all  Social Connections: Socially Integrated (07/17/2024)   Social Connection and Isolation Panel    Frequency of Communication with Friends and Family: More than three times a week    Frequency of Social Gatherings with Friends and Family: Not on file    Attends Religious Services:  More than 4 times per year    Active Member of [employer] or Organizations: Yes    Attends Banker Meetings: More than 4 times per year    Marital Status: Married  Catering Manager Violence: Not At Risk (11/23/2023)   Humiliation, Afraid, Rape, and Kick questionnaire    Fear of Current or Ex-Partner: No    Emotionally Abused: No    Physically Abused: No    Sexually Abused: No    Family History  Problem Relation Age of Onset   Breast cancer Mother        Age 77   Hypertension Mother    Depression Mother    Arthritis Mother    Cancer Mother    COPD Mother    Hypertension Father    Arthritis Father    Heart disease Father    Cancer Father        Spleen   Sleep apnea Father    Obesity Paternal Aunt    Obesity Paternal Uncle    Hearing loss Maternal Grandmother    Heart disease Maternal Grandfather    Colon cancer Paternal Grandfather        Colon  cancer   Stomach cancer Neg Hx    Colon polyps Neg Hx    Rectal cancer Neg Hx    Esophageal cancer Neg Hx     Current Outpatient Medications on File Prior to Visit  Medication Sig Dispense Refill   acetaminophen  (TYLENOL ) 500 MG tablet Take 2 tablets (1,000 mg total) by mouth every 6 (six) hours as needed for mild pain (pain score 1-3) or moderate pain (pain score 4-6). 60 tablet 0   aspirin  EC 81 MG tablet Take 1 tablet (81 mg total) by mouth 2 (two) times daily. To prevent blood clots for 30 days after surgery. 60 tablet 0   buPROPion  (WELLBUTRIN  SR) 200 MG 12 hr tablet Take 1 tablet (200 mg total) by mouth daily. 90 tablet 0   docusate sodium  (COLACE) 100 MG capsule Take 100 mg by mouth in the morning.     Loratadine (CLARITIN PO) Take 1 tablet by mouth in the morning.     losartan -hydrochlorothiazide  (HYZAAR) 100-12.5 MG tablet Take 1 tablet by mouth daily. 90 tablet 0   metFORMIN  (GLUCOPHAGE ) 500 MG tablet Take 1 tablet (500 mg total) by mouth 2 (two) times daily with a meal. 180 tablet 0   methocarbamol  (ROBAXIN -750)  750 MG tablet Take 1 tablet (750 mg total) by mouth every 8 (eight) hours as needed for muscle spasms. 20 tablet 0   ondansetron  (ZOFRAN -ODT) 4 MG disintegrating tablet Take 1 tablet (4 mg total) by mouth every 8 (eight) hours as needed for nausea or vomiting. 15 tablet 0   venlafaxine  (EFFEXOR ) 75 MG tablet Take 1 tablet (75 mg total) by mouth daily. 90 tablet 0   UNABLE TO FIND C PAP for sleep apnea      No current facility-administered medications on file prior to visit.    Allergies  Allergen Reactions   Ace Inhibitors Cough   Lisinopril Cough       Physical Exam Vitals requested from patient and listed below if patient had equipment and was able to obtain at home for this virtual visit: There were no vitals filed for this visit. Estimated body mass index is 35.35 kg/m as calculated from the following:   Height as of 07/02/24: 5' 6 (1.676 m).   Weight as of 07/02/24: 219 lb (99.3 kg).  EKG (optional): deferred due to virtual visit  GENERAL: alert, oriented, no acute distress detected, full vision exam deferred due to pandemic and/or virtual encounter  HEENT: atraumatic, conjunttiva clear, no obvious abnormalities on inspection of external nose and ears  NECK: normal movements of the head and neck  LUNGS: on inspection no signs of respiratory distress, breathing rate appears normal, no obvious gross SOB, gasping or wheezing  CV: no obvious cyanosis  MS: moves all visible extremities without noticeable abnormality  PSYCH/NEURO: pleasant and cooperative, no obvious depression or anxiety, speech and thought processing grossly intact, Cognitive function grossly intact  Flowsheet Row Office Visit from 05/21/2024 in [facility]  PHQ-9 Total Score 5        05/21/2024    9:21 AM 02/29/2024    8:11 AM 12/28/2023    9:05 AM 12/27/2023    1:26 PM 06/26/2023    9:45 AM  Depression screen PHQ 2/9  Decreased Interest 0 0 0 0 1  Down, Depressed,  Hopeless 0 0 0 0 0  PHQ - 2 Score 0 0 0 0 1  Altered sleeping 2 2 0  0  Tired, decreased energy 1  2 0  1  Change in appetite 0 0 0  0  Feeling bad or failure about yourself  1 2 0  1  Trouble concentrating 1 2 0  0  Moving slowly or fidgety/restless 0 0 0  0  Suicidal thoughts 0 0 0  0  PHQ-9 Score 5  8  0   3   Difficult doing work/chores Somewhat difficult Somewhat difficult Not difficult at all       Data saved with a previous flowsheet row definition       06/26/2023    9:49 AM 12/27/2023    1:18 PM 02/29/2024    8:10 AM 05/21/2024    9:21 AM 07/17/2024    3:50 PM  Fall Risk  Falls in the past year? 0 1 1 1 1   Was there an injury with Fall? 0  1  1  1   0  Fall Risk Category Calculator 0 2 3 3 2   Patient at Risk for Falls Due to No Fall Risks History of fall(s);Impaired mobility History of fall(s) History of fall(s)   Fall risk Follow up Falls evaluation completed Follow up appointment Falls evaluation completed Falls evaluation completed Falls evaluation completed;Education provided     Data saved with a previous flowsheet row definition     SUMMARY AND PLAN:  Encounter for Medicare annual wellness exam   Discussed applicable health maintenance/preventive health measures and advised and referred or ordered per patient preferences: -not sure why she has flag for cervical ca screening as reports hx TAH for fibroids and reports normal paps in the past, she says she will call her gyn doc about this to see if further testing needed -discussed vaccine due and she knows can get at the pharmacy Health Maintenance  Topic Date Due   COVID-19 Vaccine (1) Never done   Cervical Cancer Screening (Pap smear)  01/12/2012   Medicare Annual Wellness (AWV)  07/17/2025   Mammogram  10/23/2025   Colonoscopy  07/23/2028   DTaP/Tdap/Td (5 - Td or Tdap) 07/07/2033   Pneumococcal Vaccine: 50+ Years  Completed   Influenza Vaccine  Completed   Bone Density Scan  Completed   Hepatitis C  Screening  Completed   Zoster Vaccines- Shingrix  Completed   Meningococcal B Vaccine  Aged Out      Education and counseling on the following was provided based on the above review of health and a plan/checklist for the patient, along with additional information discussed, was provided for the patient in the patient instructions :  -requested copy ad -Provided counseling and plan for increased risk of falling if applicable per above screening. Reviewed and demonstrated safe balance exercises that can be done at home to improve balance and discussed exercise guidelines for adults with include balance exercises at least 3 days per week.  -Advised and counseled on a healthy lifestyle - including the importance of a healthy diet, regular physical activity, social connections and stress management. -Reviewed patient's current diet. Advised and counseled on a whole foods based healthy diet. A summary of a healthy diet was provided in the Patient Instructions.  -reviewed patient's current physical activity level and discussed exercise guidelines for adults. Discussed community resources and ideas for safe exercise at home to assist in meeting exercise guideline recommendations in a safe and healthy way.  -Advise yearly dental visits at minimum and regular eye exams   Follow up: see patient instructions     Patient Instructions  I really enjoyed getting  to talk with you today! I am available on Tuesdays and Thursdays for virtual visits if you have any questions or concerns, or if I can be of any further assistance.   CHECKLIST FROM ANNUAL WELLNESS VISIT:  -Follow up (please call to schedule if not scheduled after visit):   -yearly for annual wellness visit with primary care office  Here is a list of your preventive care/health maintenance measures and the plan for each if any are due:  PLAN For any measures below that may be due:    1. Please check with Dr. Cleotilde about the pap  smear.   2. Can get covid vaccine at the pharmacy if you wish. Please let us  know if you get any vaccines at the pharmacy so that we can update your record.   Health Maintenance  Topic Date Due   COVID-19 Vaccine (1) Never done   Cervical Cancer Screening (Pap smear)  01/12/2012   Medicare Annual Wellness (AWV)  07/17/2025   Mammogram  10/23/2025   Colonoscopy  07/23/2028   DTaP/Tdap/Td (5 - Td or Tdap) 07/07/2033   Pneumococcal Vaccine: 50+ Years  Completed   Influenza Vaccine  Completed   Bone Density Scan  Completed   Hepatitis C Screening  Completed   Zoster Vaccines- Shingrix  Completed   Meningococcal B Vaccine  Aged Out    -See a dentist at least yearly  -Get your eyes checked and then per your eye specialist's recommendations  -Other issues addressed today:   -I have included below further information regarding a healthy whole foods based diet, physical activity guidelines for adults, stress management and opportunities for social connections. I hope you find this information useful.   -----------------------------------------------------------------------------------------------------------------------------------------------------------------------------------------------------------------------------------------------------------    NUTRITION: -eat real food: lots of colorful vegetables (half the plate) and fruits -5-7 servings of vegetables and fruits per day (fresh or steamed is best), exp. 2 servings of vegetables with lunch and dinner and 2 servings of fruit per day. Berries and greens such as kale and collards are great choices.  -consume on a regular basis:  fresh fruits, fresh veggies, fish, nuts, seeds, healthy oils (such as olive oil, avocado oil), whole grains (make sure for bread/pasta/crackers/etc., that the first ingredient on label contains the word whole), legumes. -can eat small amounts of dairy and lean meat (no larger than the palm of your hand),  but avoid processed meats such as ham, bacon, lunch meat, etc. -drink water -try to avoid fast food and pre-packaged foods, processed meat, ultra processed foods/beverages (donuts, candy, etc.) -most experts advise limiting sodium to < 2300mg  per day, should limit further is any chronic conditions such as high blood pressure, heart disease, diabetes, etc. The American Heart Association advised that < 1500mg  is is ideal -try to avoid foods/beverages that contain any ingredients with names you do not recognize  -try to avoid foods/beverages  with added sugar or sweeteners/sweets  -try to avoid sweet drinks (including diet drinks): soda, juice, Gatorade, sweet tea, power drinks, diet drinks -try to avoid white rice, white bread, pasta (unless whole grain)  EXERCISE GUIDELINES FOR ADULTS: -if you wish to increase your physical activity, do so gradually and with the approval of your doctor -STOP and seek medical care immediately if you have any chest pain, chest discomfort or trouble breathing when starting or increasing exercise  -move and stretch your body, legs, feet and arms when sitting for long periods -Physical activity guidelines for optimal health in adults: -get at least 150 minutes  per week of moderate exercise (can talk, but not sing); this is about 20-30 minutes of sustained activity 5-7 days per week or two 10-15 minute episodes of sustained activity 5-7 days per week -do some muscle building/resistance training/strength training at least 2 days per week  -balance exercises 3+ days per week:   Stand somewhere where you have something sturdy to hold onto if you lose balance    1) lift up on toes, then back down, start with 5x per day and work up to 20x   2) stand and lift one leg straight out to the side so that foot is a few inches of the floor, start with 5x each side and work up to 20x each side   3) stand on one foot, start with 5 seconds each side and work up to 20 seconds on each  side  If you need ideas or help with getting more active:  -Silver sneakers https://tools.silversneakers.com  -Walk with a Doc: Http://www.duncan-williams.com/  -try to include resistance (weight lifting/strength building) and balance exercises twice per week: or the following link for ideas: http://castillo-powell.com/  buyducts.dk  STRESS MANAGEMENT: -can try meditating, or just sitting quietly with deep breathing while intentionally relaxing all parts of your body for 5 minutes daily -if you need further help with stress, anxiety or depression please follow up with your primary doctor or contact the wonderful folks at [facility]: [phone]  SOCIAL CONNECTIONS: -options in [place] if you wish to engage in more social and exercise related activities:  -Silver sneakers https://tools.silversneakers.com  -Walk with a Doc: Http://www.duncan-williams.com/  -Check out the [facility] Active Adults 50+ section on the [place] of [employer] companies (hiking clubs, book clubs, cards and games, chess, exercise classes, aquatic classes and much more) - see the website for details: https://www.[place].gov/departments/parks-recreation/active-adults50  -YouTube has lots of exercise videos for different ages and abilities as well  -[facility] (a variety of indoor and outdoor inperson activities for adults). [phone]. [address].  -Virtual Online Classes (a variety of topics): see seniorplanet.org or call [phone]  -consider volunteering at a school, hospice center, church, senior center or elsewhere            Chiquita JONELLE Cramp, DO

## 2024-07-19 LAB — GENECONNECT MOLECULAR SCREEN: Genetic Analysis Overall Interpretation: NEGATIVE

## 2024-08-12 ENCOUNTER — Ambulatory Visit (INDEPENDENT_AMBULATORY_CARE_PROVIDER_SITE_OTHER): Payer: Self-pay | Admitting: Family Medicine

## 2024-08-12 ENCOUNTER — Encounter (INDEPENDENT_AMBULATORY_CARE_PROVIDER_SITE_OTHER): Payer: Self-pay | Admitting: Family Medicine

## 2024-08-12 VITALS — BP 138/79 | HR 89 | Temp 98.1°F | Ht 66.0 in | Wt 217.0 lb

## 2024-08-12 DIAGNOSIS — R7303 Prediabetes: Secondary | ICD-10-CM | POA: Diagnosis not present

## 2024-08-12 DIAGNOSIS — Z6835 Body mass index (BMI) 35.0-35.9, adult: Secondary | ICD-10-CM | POA: Diagnosis not present

## 2024-08-12 DIAGNOSIS — E669 Obesity, unspecified: Secondary | ICD-10-CM | POA: Diagnosis not present

## 2024-08-12 NOTE — Progress Notes (Signed)
 "  Office: [phone]  /  Fax: [phone]  WEIGHT SUMMARY AND BIOMETRICS  Anthropometric Measurements Height: 5' 6 (1.676 m) Weight: 217 lb (98.4 kg) BMI (Calculated): 35.04 Weight at Last Visit: 219lb Weight Lost Since Last Visit: 2lb Weight Gained Since Last Visit: 0lb Starting Weight: 281lb Total Weight Loss (lbs): 64 lb (29 kg) Peak Weight: 285lb   Body Composition  Body Fat %: 48.3 % Fat Mass (lbs): 105 lbs Muscle Mass (lbs): 106.6 lbs Total Body Water (lbs): 78.4 lbs Visceral Fat Rating : 15   Other Clinical Data Fasting: No Labs: No Today's Visit #: 91 Starting Date: 02/06/18    Chief Complaint: OBESITY   History of Present Illness Cheryl [name] is a 68 year old female with obesity and prediabetes who presents for obesity treatment and progress assessment.  She has been prescribed a germ implant and follows a diet of 1400 calories with 80 grams of protein, adhering to this regimen about 50% of the time. She engages in walking for exercise, 30 minutes at a time, two days a week. Since her last visit six weeks ago, she has lost two pounds, including weight loss over Thanksgiving and Christmas.  In addition to obesity, she has prediabetes and is focusing on diet, exercise, and weight loss to improve her glucose levels and prevent the onset of type 2 diabetes. She is currently taking metformin , 500 mg twice a day.  She experiences lightheadedness, which she attributes to reducing her salt intake while managing her mother's congestive heart failure. The lightheadedness occurs primarily in the mornings after she has been up and moving around, and then sits down to have coffee and take her medications. She also notes a decrease in water intake and has experienced dry mouth.  Her mother recently had congestive heart failure and is on a low sodium diet.      PHYSICAL EXAM:  Blood pressure 138/79, pulse 89, temperature 98.1 F (36.7 C), height 5' 6  (1.676 m), weight 217 lb (98.4 kg), SpO2 98%. Body mass index is 35.02 kg/m.  DIAGNOSTIC DATA REVIEWED:  BMET    Component Value Date/Time   NA 138 05/28/2024 1008   K 4.6 05/28/2024 1008   CL 101 05/28/2024 1008   CO2 23 05/28/2024 1008   GLUCOSE 78 05/28/2024 1008   GLUCOSE 88 01/16/2024 0904   BUN 26 05/28/2024 1008   CREATININE 1.10 (H) 05/28/2024 1008   CREATININE 0.81 06/08/2016 1353   CALCIUM 9.3 05/28/2024 1008   GFRNONAA >60 11/26/2023 1548   GFRAA 106 06/10/2020 0953   Lab Results  Component Value Date   HGBA1C 5.7 02/29/2024   HGBA1C 5.8 (H) 02/06/2018   Lab Results  Component Value Date   INSULIN  9.3 05/08/2023   INSULIN  8.7 02/06/2018   Lab Results  Component Value Date   TSH 2.51 02/29/2024   CBC    Component Value Date/Time   WBC 4.4 05/28/2024 1008   WBC 4.4 12/28/2023 0948   RBC 4.25 05/28/2024 1008   RBC 4.48 12/28/2023 0948   HGB 12.1 05/28/2024 1008   HGB 14.3 04/02/2014 0937   HCT 38.7 05/28/2024 1008   PLT 277 05/28/2024 1008   MCV 91 05/28/2024 1008   MCH 28.5 05/28/2024 1008   MCH 28.0 11/26/2023 1548   MCHC 31.3 (L) 05/28/2024 1008   MCHC 32.5 12/28/2023 0948   RDW 13.9 05/28/2024 1008   Iron Studies No results found for: IRON, TIBC, FERRITIN, IRONPCTSAT Lipid Panel  Component Value Date/Time   CHOL 212 (H) 02/29/2024 0911   CHOL 240 (H) 05/08/2023 0910   TRIG 50.0 02/29/2024 0911   HDL 77.60 02/29/2024 0911   HDL 76 05/08/2023 0910   CHOLHDL 3 02/29/2024 0911   VLDL 10.0 02/29/2024 0911   LDLCALC 124 (H) 02/29/2024 0911   LDLCALC 147 (H) 05/08/2023 0910   Hepatic Function Panel     Component Value Date/Time   PROT 6.6 05/28/2024 1008   ALBUMIN  4.3 05/28/2024 1008   AST 17 05/28/2024 1008   ALT 14 05/28/2024 1008   ALKPHOS 165 (H) 05/28/2024 1008   BILITOT 0.5 05/28/2024 1008      Component Value Date/Time   TSH 2.51 02/29/2024 0911   Nutritional Lab Results  Component Value Date   VD25OH 41.08  02/29/2024   VD25OH 62.9 05/08/2023   VD25OH 50.4 11/16/2022     Assessment and Plan Assessment & Plan Obesity Management is ongoing with a focus on diet, exercise, and weight loss. She has lost two pounds in the last six weeks, including weight loss over Thanksgiving and Christmas, which is commendable. She is using a germ implant with 1400 calories and 80 mg of protein, which she feels she needs about 50% of the time. She is walking for exercise for 30 minutes, two days a week. The goal is to maximize exercise benefits by aiming for 150 minutes of moderate intensity exercise per week, spread out for better results. Exercise is critical for maintaining weight loss and overall health, especially as she ages. - Aim for 150 minutes of moderate intensity exercise per week, spread out for better results. - Consider indoor activities like walking videos or chair yoga on bad weather days. - Set up new watch to track steps and monitor activity levels.  Prediabetes Management is focused on diet, exercise, and weight loss to improve glucose levels and prevent the onset of type 2 diabetes. She is currently on metformin  500 mg twice a day. She is working hard on diet and exercise to manage her condition. - Continue metformin  500 mg twice a day. - Continue diet and exercise regimen to improve glucose levels.      Patients who are on anti-obesity medications are counseled on the importance of maintaining healthy lifestyle habits, including balanced nutrition, regular physical activity, and behavioral modifications,  Medication is an adjunct to, not a replacement for, lifestyle changes and that the long-term success and weight maintenance depend on continued adherence to these strategies.   Cheryl [name] was informed of the importance of frequent follow up visits to maximize her success with intensive lifestyle modifications for her obesity and obesity related health conditions as recommended by USPSTF and CMS  guidelines  I  Louann Penton, MD   "

## 2024-09-10 ENCOUNTER — Encounter (INDEPENDENT_AMBULATORY_CARE_PROVIDER_SITE_OTHER): Payer: Self-pay | Admitting: Family Medicine

## 2024-09-10 ENCOUNTER — Ambulatory Visit (INDEPENDENT_AMBULATORY_CARE_PROVIDER_SITE_OTHER): Admitting: Family Medicine

## 2024-09-10 VITALS — BP 100/68 | HR 87 | Temp 97.9°F | Ht 66.0 in | Wt 216.0 lb

## 2024-09-10 DIAGNOSIS — E669 Obesity, unspecified: Secondary | ICD-10-CM | POA: Diagnosis not present

## 2024-09-10 DIAGNOSIS — Z6834 Body mass index (BMI) 34.0-34.9, adult: Secondary | ICD-10-CM

## 2024-09-10 DIAGNOSIS — F3289 Other specified depressive episodes: Secondary | ICD-10-CM

## 2024-09-10 DIAGNOSIS — R7303 Prediabetes: Secondary | ICD-10-CM

## 2024-09-10 DIAGNOSIS — I10 Essential (primary) hypertension: Secondary | ICD-10-CM

## 2024-09-10 DIAGNOSIS — M25561 Pain in right knee: Secondary | ICD-10-CM | POA: Diagnosis not present

## 2024-09-10 DIAGNOSIS — F5089 Other specified eating disorder: Secondary | ICD-10-CM | POA: Diagnosis not present

## 2024-09-10 DIAGNOSIS — G8929 Other chronic pain: Secondary | ICD-10-CM

## 2024-09-10 DIAGNOSIS — M25562 Pain in left knee: Secondary | ICD-10-CM | POA: Diagnosis not present

## 2024-09-10 NOTE — Progress Notes (Signed)
 "  Office: [phone]  /  Fax: [phone]  WEIGHT SUMMARY AND BIOMETRICS  Anthropometric Measurements Height: 5' 6 (1.676 m) Weight: 216 lb (98 kg) BMI (Calculated): 34.88 Weight at Last Visit: 217 lb Weight Lost Since Last Visit: 1 lb Weight Gained Since Last Visit: 0 Starting Weight: 281 lb Total Weight Loss (lbs): 65 lb (29.5 kg) Peak Weight: 285 lb   Body Composition  Body Fat %: 48 % Fat Mass (lbs): 104 lbs Muscle Mass (lbs): 106.8 lbs Total Body Water (lbs): 80 lbs Visceral Fat Rating : 14   Other Clinical Data Fasting: no Labs: no Today's Visit #: 92 Starting Date: 02/06/18    Chief Complaint: OBESITY    History of Present Illness Cheryl [name] is a 69 year old female with obesity and prediabetes who presents for a follow-up on her obesity treatment and progress.  She is following a dietary plan with a goal of 1400 calories and 70 or more grams of protein daily, achieving this about 50% of the time. She incorporates more whole foods into her diet and hydrates adequately. She exercises at the gym two days a week for 45 minutes. She has lost one pound in the last month and a total of 65 pounds since starting the program.  In managing her prediabetes, she has been modifying her nutrition by decreasing simple carbohydrates and increasing activity, which has contributed to her weight loss. She is on metformin  500 mg twice a day and reports good control of her condition. She requests a refill of her medication.  Her blood pressure today was 100/68. She has been working on increasing her activity, decreasing sodium intake, and losing weight to help manage her blood pressure. She is on Hyzaar 100/12.5 mg daily and requests a refill.  She experiences emotional eating behaviors and is working on being mindful of not using food for comfort. She is on Effexor  75 mg and bupropion  SR 200 mg daily, which she reports as stable. She requests a refill of these  medications.  She reports bilateral knee pain, which she manages with exercises such as modified squats and deep knee bends. She no longer attends physical therapy but continues to do exercises at home. She experiences soreness when standing up and occasional cramps after sitting for long periods. She uses a cane for stability in icy conditions.  She reports feeling lightheaded or dizzy at times.      PHYSICAL EXAM:  Blood pressure 100/68, pulse 87, temperature 97.9 F (36.6 C), height 5' 6 (1.676 m), weight 216 lb (98 kg), SpO2 97%. Body mass index is 34.86 kg/m.  DIAGNOSTIC DATA REVIEWED BY MYSELF TODAY:  BMET    Component Value Date/Time   NA 138 05/28/2024 1008   K 4.6 05/28/2024 1008   CL 101 05/28/2024 1008   CO2 23 05/28/2024 1008   GLUCOSE 78 05/28/2024 1008   GLUCOSE 88 01/16/2024 0904   BUN 26 05/28/2024 1008   CREATININE 1.10 (H) 05/28/2024 1008   CREATININE 0.81 06/08/2016 1353   CALCIUM 9.3 05/28/2024 1008   GFRNONAA >60 11/26/2023 1548   GFRAA 106 06/10/2020 0953   Lab Results  Component Value Date   HGBA1C 5.7 02/29/2024   HGBA1C 5.8 (H) 02/06/2018   Lab Results  Component Value Date   INSULIN  9.3 05/08/2023   INSULIN  8.7 02/06/2018   Lab Results  Component Value Date   TSH 2.51 02/29/2024   CBC    Component Value Date/Time   WBC 4.4  05/28/2024 1008   WBC 4.4 12/28/2023 0948   RBC 4.25 05/28/2024 1008   RBC 4.48 12/28/2023 0948   HGB 12.1 05/28/2024 1008   HGB 14.3 04/02/2014 0937   HCT 38.7 05/28/2024 1008   PLT 277 05/28/2024 1008   MCV 91 05/28/2024 1008   MCH 28.5 05/28/2024 1008   MCH 28.0 11/26/2023 1548   MCHC 31.3 (L) 05/28/2024 1008   MCHC 32.5 12/28/2023 0948   RDW 13.9 05/28/2024 1008   Iron Studies No results found for: IRON, TIBC, FERRITIN, IRONPCTSAT Lipid Panel     Component Value Date/Time   CHOL 212 (H) 02/29/2024 0911   CHOL 240 (H) 05/08/2023 0910   TRIG 50.0 02/29/2024 0911   HDL 77.60 02/29/2024 0911    HDL 76 05/08/2023 0910   CHOLHDL 3 02/29/2024 0911   VLDL 10.0 02/29/2024 0911   LDLCALC 124 (H) 02/29/2024 0911   LDLCALC 147 (H) 05/08/2023 0910   Hepatic Function Panel     Component Value Date/Time   PROT 6.6 05/28/2024 1008   ALBUMIN  4.3 05/28/2024 1008   AST 17 05/28/2024 1008   ALT 14 05/28/2024 1008   ALKPHOS 165 (H) 05/28/2024 1008   BILITOT 0.5 05/28/2024 1008      Component Value Date/Time   TSH 2.51 02/29/2024 0911   Nutritional Lab Results  Component Value Date   VD25OH 41.08 02/29/2024   VD25OH 62.9 05/08/2023   VD25OH 50.4 11/16/2022     Assessment and Plan Assessment & Plan Obesity Management is ongoing with a focus on dietary modifications and increased physical activity. She adheres to a 1400 calorie diet with a goal of 75 grams of protein daily, achieving this about 50% of the time. She is increasing whole foods and protein intake, hydrating adequately, and exercising at the gym twice a week for 45 minutes. She has lost 1 pound in the last month and a total of 65 pounds since starting treatment. - Continue current dietary plan with focus on protein intake and whole foods. - Continue exercising at the gym twice a week for 45 minutes.  Prediabetes Well-controlled with lifestyle modifications and medication. She has been modifying her nutrition by decreasing simple carbohydrates and increasing physical activity, resulting in weight loss. She is on metformin  500 mg twice a day and requests a refill. - Continue metformin  500 mg twice a day. - Refilled metformin  prescription.  Essential hypertension Well-controlled with a blood pressure reading of 100/68 mmHg. She is on Hyzaar 100/12.5 mg daily and requests a refill. Due to low blood pressure and lifestyle changes, there is a risk of hypotension, so medication adjustment is considered. - Reduced Hyzaar dose to half for the next month. - Monitor blood pressure at home 2-3 times a week and bring readings to  next appointment. - Refilled Hyzaar prescription.  Emotional eating behaviors Present, and she is working on being mindful and not using food for comfort. She is on Effexor  75 mg and bupropion  SR 200 mg daily, which are well-managed. - Continue Effexor  75 mg daily. - Continue bupropion  SR 200 mg daily. - Refilled Effexor  and bupropion  prescriptions.  Bilateral knee pain Persists, but she is managing with exercises and occasional use of a cane. She has completed physical therapy and is performing modified squats and deep knee bends at home. Pain is manageable, and she is able to perform daily activities with some assistance. - Continue home exercises for knee strengthening. - Use cane as needed for stability.  Patients who are on anti-obesity medications are counseled on the importance of maintaining healthy lifestyle habits, including balanced nutrition, regular physical activity, and behavioral modifications,  Medication is an adjunct to, not a replacement for, lifestyle changes and that the long-term success and weight maintenance depend on continued adherence to these strategies.   Cheryl [name] was informed of the importance of frequent follow up visits to maximize her success with intensive lifestyle modifications for her obesity and obesity related health conditions as recommended by USPSTF and CMS guidelines  Louann Penton, MD   "

## 2024-10-08 ENCOUNTER — Ambulatory Visit (INDEPENDENT_AMBULATORY_CARE_PROVIDER_SITE_OTHER): Admitting: Family Medicine

## 2024-10-15 ENCOUNTER — Ambulatory Visit (INDEPENDENT_AMBULATORY_CARE_PROVIDER_SITE_OTHER): Admitting: Family Medicine

## 2024-11-20 ENCOUNTER — Ambulatory Visit (INDEPENDENT_AMBULATORY_CARE_PROVIDER_SITE_OTHER): Admitting: Family Medicine

## 2025-03-23 ENCOUNTER — Encounter: Admitting: Family Medicine
